# Patient Record
Sex: Male | Born: 1985 | Race: Black or African American | Hispanic: No | Marital: Single | Smoking: Current every day smoker
Health system: Southern US, Community
[De-identification: ages and names within clinical notes are randomized; demographics above are authoritative.]

## PROBLEM LIST (undated history)

## (undated) DIAGNOSIS — I10 Essential (primary) hypertension: Secondary | ICD-10-CM

## (undated) DIAGNOSIS — K449 Diaphragmatic hernia without obstruction or gangrene: Secondary | ICD-10-CM

## (undated) DIAGNOSIS — F4310 Post-traumatic stress disorder, unspecified: Secondary | ICD-10-CM

## (undated) DIAGNOSIS — K922 Gastrointestinal hemorrhage, unspecified: Secondary | ICD-10-CM

## (undated) DIAGNOSIS — G43909 Migraine, unspecified, not intractable, without status migrainosus: Secondary | ICD-10-CM

## (undated) DIAGNOSIS — F1920 Other psychoactive substance dependence, uncomplicated: Secondary | ICD-10-CM

## (undated) HISTORY — PX: KNEE SURGERY: SHX244

---

## 2007-02-03 ENCOUNTER — Emergency Department: Payer: Self-pay | Admitting: Emergency Medicine

## 2008-01-29 ENCOUNTER — Emergency Department: Payer: Self-pay | Admitting: Emergency Medicine

## 2008-01-29 ENCOUNTER — Other Ambulatory Visit: Payer: Self-pay

## 2015-06-12 ENCOUNTER — Encounter: Payer: Self-pay | Admitting: Emergency Medicine

## 2015-06-12 ENCOUNTER — Emergency Department
Admission: EM | Admit: 2015-06-12 | Discharge: 2015-06-15 | Disposition: A | Discharge status: Home / Self Care | Payer: Self-pay | Attending: Emergency Medicine | Admitting: Emergency Medicine

## 2015-06-12 DIAGNOSIS — Y9289 Other specified places as the place of occurrence of the external cause: Secondary | ICD-10-CM | POA: Insufficient documentation

## 2015-06-12 DIAGNOSIS — Y998 Other external cause status: Secondary | ICD-10-CM | POA: Insufficient documentation

## 2015-06-12 DIAGNOSIS — F14921 Cocaine use, unspecified with intoxication delirium: Secondary | ICD-10-CM

## 2015-06-12 DIAGNOSIS — R45851 Suicidal ideations: Secondary | ICD-10-CM

## 2015-06-12 DIAGNOSIS — F1210 Cannabis abuse, uncomplicated: Secondary | ICD-10-CM

## 2015-06-12 DIAGNOSIS — Y9389 Activity, other specified: Secondary | ICD-10-CM | POA: Insufficient documentation

## 2015-06-12 DIAGNOSIS — F4310 Post-traumatic stress disorder, unspecified: Secondary | ICD-10-CM

## 2015-06-12 DIAGNOSIS — I10 Essential (primary) hypertension: Secondary | ICD-10-CM | POA: Insufficient documentation

## 2015-06-12 DIAGNOSIS — F29 Unspecified psychosis not due to a substance or known physiological condition: Secondary | ICD-10-CM

## 2015-06-12 DIAGNOSIS — S069X9A Unspecified intracranial injury with loss of consciousness of unspecified duration, initial encounter: Secondary | ICD-10-CM

## 2015-06-12 DIAGNOSIS — F14221 Cocaine dependence with intoxication delirium: Secondary | ICD-10-CM | POA: Insufficient documentation

## 2015-06-12 DIAGNOSIS — N179 Acute kidney failure, unspecified: Secondary | ICD-10-CM | POA: Insufficient documentation

## 2015-06-12 DIAGNOSIS — S069XAA Unspecified intracranial injury with loss of consciousness status unknown, initial encounter: Secondary | ICD-10-CM

## 2015-06-12 DIAGNOSIS — F1420 Cocaine dependence, uncomplicated: Secondary | ICD-10-CM

## 2015-06-12 DIAGNOSIS — Z87891 Personal history of nicotine dependence: Secondary | ICD-10-CM | POA: Insufficient documentation

## 2015-06-12 HISTORY — DX: Gastrointestinal hemorrhage, unspecified: K92.2

## 2015-06-12 HISTORY — DX: Post-traumatic stress disorder, unspecified: F43.10

## 2015-06-12 HISTORY — DX: Other psychoactive substance dependence, uncomplicated: F19.20

## 2015-06-12 HISTORY — DX: Essential (primary) hypertension: I10

## 2015-06-12 LAB — URINALYSIS COMPLETE WITH MICROSCOPIC (ARMC ONLY)
BACTERIA UA: NONE SEEN
Bilirubin Urine: NEGATIVE
Glucose, UA: NEGATIVE mg/dL
Leukocytes, UA: NEGATIVE
Nitrite: NEGATIVE
Protein, ur: 100 mg/dL — AB
SPECIFIC GRAVITY, URINE: 1.018 (ref 1.005–1.030)
SQUAMOUS EPITHELIAL / LPF: NONE SEEN
pH: 5 (ref 5.0–8.0)

## 2015-06-12 LAB — URINE DRUG SCREEN, QUALITATIVE (ARMC ONLY)
Amphetamines, Ur Screen: NOT DETECTED
BARBITURATES, UR SCREEN: NOT DETECTED
BENZODIAZEPINE, UR SCRN: NOT DETECTED
CANNABINOID 50 NG, UR ~~LOC~~: POSITIVE — AB
Cocaine Metabolite,Ur ~~LOC~~: POSITIVE — AB
MDMA (Ecstasy)Ur Screen: NOT DETECTED
METHADONE SCREEN, URINE: NOT DETECTED
OPIATE, UR SCREEN: NOT DETECTED
Phencyclidine (PCP) Ur S: NOT DETECTED
TRICYCLIC, UR SCREEN: NOT DETECTED

## 2015-06-12 LAB — COMPREHENSIVE METABOLIC PANEL
ALT: 22 U/L (ref 17–63)
AST: 47 U/L — ABNORMAL HIGH (ref 15–41)
Albumin: 5.3 g/dL — ABNORMAL HIGH (ref 3.5–5.0)
Alkaline Phosphatase: 85 U/L (ref 38–126)
Anion gap: 15 (ref 5–15)
BUN: 17 mg/dL (ref 6–20)
CALCIUM: 10.3 mg/dL (ref 8.9–10.3)
CO2: 22 mmol/L (ref 22–32)
Chloride: 99 mmol/L — ABNORMAL LOW (ref 101–111)
Creatinine, Ser: 1.8 mg/dL — ABNORMAL HIGH (ref 0.61–1.24)
GFR calc Af Amer: 56 mL/min — ABNORMAL LOW (ref >60)
GFR calc non Af Amer: 49 mL/min — ABNORMAL LOW (ref >60)
Glucose, Bld: 116 mg/dL — ABNORMAL HIGH (ref 65–99)
Potassium: 3.7 mmol/L (ref 3.5–5.1)
Sodium: 136 mmol/L (ref 135–145)
Total Bilirubin: 1.5 mg/dL — ABNORMAL HIGH (ref 0.3–1.2)
Total Protein: 9.4 g/dL — ABNORMAL HIGH (ref 6.5–8.1)

## 2015-06-12 LAB — CBC
HCT: 45.6 % (ref 40.0–52.0)
HEMOGLOBIN: 15 g/dL (ref 13.0–18.0)
MCH: 30.5 pg (ref 26.0–34.0)
MCHC: 32.9 g/dL (ref 32.0–36.0)
MCV: 92.5 fL (ref 80.0–100.0)
PLATELETS: 142 10*3/uL — AB (ref 150–440)
RBC: 4.93 MIL/uL (ref 4.40–5.90)
RDW: 13.5 % (ref 11.5–14.5)
WBC: 18.6 10*3/uL — ABNORMAL HIGH (ref 3.8–10.6)

## 2015-06-12 LAB — SALICYLATE LEVEL: Salicylate Lvl: <4 mg/dL (ref 2.8–30.0)

## 2015-06-12 LAB — ETHANOL: Alcohol, Ethyl (B): <5 mg/dL (ref <5)

## 2015-06-12 LAB — ACETAMINOPHEN LEVEL

## 2015-06-12 LAB — CK: Total CK: 617 U/L — ABNORMAL HIGH (ref 49–397)

## 2015-06-12 MED ORDER — LORAZEPAM 2 MG/ML IJ SOLN
INTRAMUSCULAR | Status: AC
  Administered 2015-06-12: 1 mg via INTRAVENOUS
  Filled 2015-06-12: qty 1

## 2015-06-12 MED ORDER — LORAZEPAM 2 MG/ML IJ SOLN
1.0000 mg | Freq: Once | INTRAMUSCULAR | Status: AC
  Administered 2015-06-12: 1 mg via INTRAVENOUS

## 2015-06-12 MED ORDER — SODIUM CHLORIDE 0.9 % IV BOLUS (SEPSIS)
1000.0000 mL | INTRAVENOUS | Status: AC
  Administered 2015-06-12: 1000 mL via INTRAVENOUS

## 2015-06-12 NOTE — ED Notes (Signed)
Pt agreed to call mother Ezariah Einbinder at [phone] for questions or updates.

## 2015-06-12 NOTE — ED Notes (Signed)
Pt reports has been taking cocaine all day, $3000 worth pt last used around 1850. Pt also reports using alcohol. Pt states he tried to kill himself. Pt confused at this time. States hear voices and see pictures, telling him to kill himself. EMS reports HP-154/103, HR-117, O2-97% on room air.

## 2015-06-12 NOTE — ED Provider Notes (Signed)
[facility] Emergency Department Provider Note  ____________________________________________  Time seen: Approximately 9:47 PM  I have reviewed the triage vital signs and the nursing notes.   HISTORY  Chief Complaint Drug Overdose and Suicide Attempt  History is limited by intoxication  HPI Jeremy Frank is a 29 y.o. male with a history of "psych issues "and drug abuse who presents intoxicated after what he describes as a cocaine binge all day today.  He states he has also had alcohol this morning.  He states that he wants to kill himself and thought it would be fine to do.  He says that he hears voices and sees pictures and the voices are command hallucinations that tell him to kill himself.  He describes the symptoms as severe.  He denies chest pain, shortness of breath, abdominal pain, and vomiting.   Past Medical History  Diagnosis Date  . PTSD (post-traumatic stress disorder)   . GI bleed   . Hypertension   . Drug dependence     There are no active problems to display for this patient.   History reviewed. No pertinent past surgical history.  No current outpatient prescriptions on file.  Allergies Review of patient's allergies indicates no known allergies.  History reviewed. No pertinent family history.  Social History History  Substance Use Topics  . Smoking status: Former Smoker    Types: Cigarettes  . Smokeless tobacco: Never Used  . Alcohol Use: Yes    Review of Systems Constitutional: No fever/chills Eyes: No visual changes. ENT: No sore throat. Cardiovascular: Denies chest pain. Respiratory: Denies shortness of breath. Gastrointestinal: No abdominal pain.  No nausea, no vomiting.  No diarrhea.  No constipation. Genitourinary: Negative for dysuria. Musculoskeletal: Negative for back pain. Skin: Negative for rash. Neurological: Negative for headaches, focal weakness or numbness. Psych:  Drug abuse and active suicidal  ideation  10-point ROS otherwise negative.  ____________________________________________   PHYSICAL EXAM:  VITAL SIGNS: ED Triage Vitals  Enc Vitals Group     BP 06/12/15 1954 148/92 mmHg     Pulse Rate 06/12/15 1954 103     Resp 06/12/15 1954 14     Temp 06/12/15 1954 99.8 F (37.7 C)     Temp Source 06/12/15 1954 Oral     SpO2 06/12/15 1954 100 %     Weight --      Height --      Head Cir --      Peak Flow --      Pain Score 06/12/15 1955 0     Pain Loc --      Pain Edu? --      Excl. in GC? --     Constitutional: Alert to voice but otherwise somnolent.  No acute distress Eyes: Conjunctivae are normal. PERRL. EOMI. Head: Atraumatic. Nose: No congestion/rhinnorhea. Mouth/Throat: Mucous membranes are moist.  Oropharynx non-erythematous. Neck: No stridor.   Cardiovascular: Normal rate, regular rhythm. Grossly normal heart sounds.  Good peripheral circulation. Respiratory: Normal respiratory effort.  No retractions. Lungs CTAB. Gastrointestinal: Soft and nontender. No distention. No abdominal bruits. No CVA tenderness. Musculoskeletal: No lower extremity tenderness nor edema.  No joint effusions. Neurologic:  Normal speech and language. No gross focal neurologic deficits are appreciated. Speech is normal. Skin:  Skin is warm, dry and intact. No rash noted.   ____________________________________________   LABS (all labs ordered are listed, but only abnormal results are displayed)  Labs Reviewed  ACETAMINOPHEN LEVEL - Abnormal; Notable for the  following:    Acetaminophen (Tylenol), Serum <10 (*)    All other components within normal limits  CBC - Abnormal; Notable for the following:    WBC 18.6 (*)    Platelets 142 (*)    All other components within normal limits  COMPREHENSIVE METABOLIC PANEL - Abnormal; Notable for the following:    Chloride 99 (*)    Glucose, Bld 116 (*)    Creatinine, Ser 1.80 (*)    Total Protein 9.4 (*)    Albumin 5.3 (*)    AST 47 (*)     Total Bilirubin 1.5 (*)    GFR calc non Af Amer 49 (*)    GFR calc Af Amer 56 (*)    All other components within normal limits  URINE DRUG SCREEN, QUALITATIVE ([facility] ONLY) - Abnormal; Notable for the following:    Cocaine Metabolite,Ur [place] POSITIVE (*)    Cannabinoid 50 Ng, Ur [place] POSITIVE (*)    All other components within normal limits  URINALYSIS COMPLETEWITH MICROSCOPIC ([facility] ONLY) - Abnormal; Notable for the following:    Color, Urine YELLOW (*)    APPearance HAZY (*)    Ketones, ur TRACE (*)    Hgb urine dipstick 1+ (*)    Protein, ur 100 (*)    All other components within normal limits  CK - Abnormal; Notable for the following:    Total CK 617 (*)    All other components within normal limits  ETHANOL  SALICYLATE LEVEL  BASIC METABOLIC PANEL  CK   ____________________________________________  EKG  ED ECG REPORT I, Janette Harvie, the attending physician, personally viewed and interpreted this ECG.  Date: 06/12/2015 EKG Time: 19:47 Rate: 102 Rhythm: sinus tachycardia QRS Axis: normal Intervals: normal ST/T Wave abnormalities: normal Conduction Disutrbances: none Narrative Interpretation: unremarkable  ____________________________________________  RADIOLOGY  No results found.  ____________________________________________   PROCEDURES  Procedure(s) performed: None  Critical Care performed: No ____________________________________________   INITIAL IMPRESSION / ASSESSMENT AND PLAN / ED COURSE  Pertinent labs & imaging results that were available during my care of the patient were reviewed by me and considered in my medical decision making (see chart for details).  The patient does appear intoxicated.  His labs are notable for a leukocytosis which could be expected after a cocaine binge like he describes.  He has cannabinoids and cocaine on his UDS and his metabolic panel is notable for a creatinine of 1.8 and a CK of 617.  I am treating him aggressively  with a total of 3 L of IV fluids and will plan to recheck a metabolic panel and CK at 5 AM to determine if he needs admission for further medical management or if he is adequately hydrated.  He is complaining of no specific pain at this time.  I have put him under IVC and requested a psychiatric consult.  ____________________________________________  FINAL CLINICAL IMPRESSION(S) / ED DIAGNOSES  Final diagnoses:  Cocaine intoxication, with delirium  Suicidal ideation  Acute kidney injury      NEW MEDICATIONS STARTED DURING THIS VISIT:  New Prescriptions   No medications on file     Loleta Rose, MD 06/13/15 [phone]

## 2015-06-12 NOTE — ED Notes (Addendum)
Pt is not currently medically cleared, but will be pending psych evaluation  BEHAVIORAL HEALTH ROUNDING Patient sleeping: No. Patient alert and oriented: no Behavior appropriate: No.; If no, describe: pt is hallucinating  Toileting and hygiene offered: Yes  Sitter present: no  Law senforcement present: No  ENVIRONMENTAL ASSESSMENT Potentially harmful objects out of patient reach: No. Personal belongings secured: Yes.   Patient dressed in hospital provided attire only: Yes.   Plastic bags out of patient reach: Yes.   Patient care equipment (cords, cables, call bells, lines, and drains) shortened, removed, or accounted for: Yes.   Equipment and supplies removed from bottom of stretcher: Yes.   Potentially toxic materials out of patient reach: Yes.   Sharps container removed or out of patient reach: No.

## 2015-06-13 DIAGNOSIS — S069X9A Unspecified intracranial injury with loss of consciousness of unspecified duration, initial encounter: Secondary | ICD-10-CM

## 2015-06-13 DIAGNOSIS — F1420 Cocaine dependence, uncomplicated: Secondary | ICD-10-CM

## 2015-06-13 DIAGNOSIS — F29 Unspecified psychosis not due to a substance or known physiological condition: Secondary | ICD-10-CM

## 2015-06-13 DIAGNOSIS — S069XAA Unspecified intracranial injury with loss of consciousness status unknown, initial encounter: Secondary | ICD-10-CM

## 2015-06-13 DIAGNOSIS — F1210 Cannabis abuse, uncomplicated: Secondary | ICD-10-CM

## 2015-06-13 DIAGNOSIS — F4310 Post-traumatic stress disorder, unspecified: Secondary | ICD-10-CM

## 2015-06-13 LAB — BASIC METABOLIC PANEL
ANION GAP: 9 (ref 5–15)
BUN: 15 mg/dL (ref 6–20)
CALCIUM: 9.2 mg/dL (ref 8.9–10.3)
CO2: 22 mmol/L (ref 22–32)
Chloride: 106 mmol/L (ref 101–111)
Creatinine, Ser: 1.36 mg/dL — ABNORMAL HIGH (ref 0.61–1.24)
GFR calc Af Amer: >60 mL/min (ref >60)
GFR calc non Af Amer: >60 mL/min (ref >60)
GLUCOSE: 85 mg/dL (ref 65–99)
Potassium: 4.5 mmol/L (ref 3.5–5.1)
SODIUM: 137 mmol/L (ref 135–145)

## 2015-06-13 LAB — CK: Total CK: 400 U/L — ABNORMAL HIGH (ref 49–397)

## 2015-06-13 MED ORDER — SODIUM CHLORIDE 0.9 % IV BOLUS (SEPSIS)
1000.0000 mL | INTRAVENOUS | Status: AC
  Administered 2015-06-13: 1000 mL via INTRAVENOUS
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
## 2015-06-13 NOTE — ED Notes (Signed)
BEHAVIORAL HEALTH ROUNDING Patient sleeping: No. Patient alert and oriented: yes Behavior appropriate: Yes.  ; If no, describe:  Nutrition and fluids offered: Yes  Toileting and hygiene offered: Yes  Sitter present: yes Law enforcement present: Yes  

## 2015-06-13 NOTE — ED Notes (Signed)
BEHAVIORAL HEALTH ROUNDING Patient sleeping: Yes.   Patient alert and oriented: not applicable Behavior appropriate: Yes.   Nutrition and fluids offered: Food tray at bedside  Toileting and hygiene offered: pt sleeping.  Sitter present: 15 min checks  Law enforcement present: Yes

## 2015-06-13 NOTE — ED Notes (Signed)
BEHAVIORAL HEALTH ROUNDING Patient sleeping: Yes.   Patient alert and oriented: yes Behavior appropriate: Yes.  ; If no, describe:  Nutrition and fluids offered: No Toileting and hygiene offered: No Sitter present: yes Law enforcement present: Yes [employer] 

## 2015-06-13 NOTE — ED Notes (Addendum)
Pt is not currently medically cleared, but will be pending psych evaluation   BEHAVIORAL HEALTH ROUNDING  Patient sleeping: Yes  Patient alert and oriented: no  Behavior appropriate: No.; If no, describe: pt is hallucinating  Toileting and hygiene offered: Yes  Sitter present: yes  Law senforcement present: Yes

## 2015-06-13 NOTE — BH Assessment (Signed)
Assessment Note  Jeremy Frank is an 29 y.o. male. He reports to the ED with auditory and visual hallucinations.  He states "I have been smoking crack for like five days".  He reports that in addition to smoking Crack, he has been indulging in alcohol.  He states that he is not depressed or anxious.  He admitted to having auditory hallucinations the command him to kill himself.  He denies wanting to harm himself.  He denied having suicidal or homicidal ideation or intent.  [name] reports that he is now homeless and is currently on parole.  He reports that his eating, sleeping , and energy levels have decreased.  He appeared unaware of what was occurring around him.  [name] did not appear to completely forthwright with the triage specialist.  [name] appeared to be responding to internal stimuli during the interview with the triage specialist.    Axis I: Post Traumatic Stress Disorder Axis II: Deferred Axis III:  Past Medical History  Diagnosis Date   PTSD (post-traumatic stress disorder)    GI bleed    Hypertension    Drug dependence    Axis IV: housing problems Axis V: 31-40 impairment in reality testing  Past Medical History:  Past Medical History  Diagnosis Date   PTSD (post-traumatic stress disorder)    GI bleed    Hypertension    Drug dependence     History reviewed. No pertinent past surgical history.  Family History: History reviewed. No pertinent family history.  Social History:  reports that he has quit smoking. His smoking use included Cigarettes. He has never used smokeless tobacco. He reports that he drinks alcohol. He reports that he uses illicit drugs (Cocaine).  Additional Social History:  Alcohol / Drug Use History of alcohol / drug use?: Yes Longest period of sobriety (when/how long): Unknown Withdrawal Symptoms:  (Unknown) Substance #1 Name of Substance 1: Crack 1 - Age of First Use: Unknown 1 - Amount (size/oz): "A lot" 1 -  Frequency: Daily 1 - Last Use / Amount: 06/12/2015 Substance #2 Name of Substance 2: Alcohol 2 - Age of First Use: 6 2 - Amount (size/oz): "A lot" 2 - Frequency: Daily 2 - Last Use / Amount: 06/12/2015  CIWA: CIWA-Ar BP: 118/81 mmHg Pulse Rate: 77 COWS:    Allergies: No Known Allergies  Home Medications:  (Not in a hospital admission)  OB/GYN Status:  No LMP for male patient.  General Assessment Data Location of Assessment: [facility] ED TTS Assessment: In system Is this a Tele or Face-to-Face Assessment?: Face-to-Face Is this an Initial Assessment or a Re-assessment for this encounter?: Initial Assessment Marital status: Single Maiden name: n/a Is patient pregnant?: No Pregnancy Status: No Living Arrangements:  (Homeless) Can pt return to current living arrangement?: Yes Admission Status: Voluntary Is patient capable of signing voluntary admission?: No Referral Source: MD Insurance type: None reported  Medical Screening Exam [facility] Walk-in ONLY) Medical Exam completed: Yes  Crisis Care Plan Living Arrangements:  (Homeless) Name of Psychiatrist: None Name of Therapist: None  Education Status Is patient currently in school?: No Current Grade: n/a Highest grade of school patient has completed: 12th Name of school: n/a Contact person: n/a  Risk to self with the past 6 months Suicidal Ideation: No Has patient been a risk to self within the past 6 months prior to admission? : No Suicidal Intent: No Has patient had any suicidal intent within the past 6 months prior to admission? : No  Is patient at risk for suicide?: No Suicidal Plan?: No Has patient had any suicidal plan within the past 6 months prior to admission? : No Access to Means: No What has been your use of drugs/alcohol within the last 12 months?: Daily Usage Previous Attempts/Gestures: No How many times?: 0 Other Self Harm Risks: Substance abuse Triggers for Past Attempts: None known Intentional Self  Injurious Behavior: None Family Suicide History: Unknown Recent stressful life event(s):  (Parole) Persecutory voices/beliefs?: No Depression: No Depression Symptoms:  (None reported) Substance abuse history and/or treatment for substance abuse?: Yes Suicide prevention information given to non-admitted patients: Not applicable  Risk to Others within the past 6 months Homicidal Ideation: No Does patient have any lifetime risk of violence toward others beyond the six months prior to admission? : No Thoughts of Harm to Others: No Current Homicidal Intent: No Current Homicidal Plan: No Access to Homicidal Means: No Identified Victim: None identified History of harm to others?:  (Unknown) Assessment of Violence: None Noted Violent Behavior Description: n/a Does patient have access to weapons?: No Criminal Charges Pending?: No Does patient have a court date: No Is patient on probation?: Yes (Parole)  Psychosis Hallucinations: Visual, Auditory Delusions: None noted  Mental Status Report Appearance/Hygiene: In scrubs Eye Contact: Poor Motor Activity: Restlessness Speech: Tangential Level of Consciousness: Restless Mood: Silly Affect: Silly Anxiety Level: None Thought Processes: Tangential, Flight of Ideas Judgement: Impaired Orientation: Not oriented Obsessive Compulsive Thoughts/Behaviors: None                            Advance Directives (For Healthcare) Does patient have an advance directive?: No Would patient like information on creating an advanced directive?: No - patient declined information          Disposition:  Disposition Initial Assessment Completed for this Encounter: Yes Disposition of Patient: Other dispositions (Psych MD)  On Site Evaluation by:   Reviewed with Physician:    Theadora Rama 06/13/2015 12:42 AM

## 2015-06-13 NOTE — ED Notes (Signed)
Family in to visit.

## 2015-06-13 NOTE — ED Notes (Signed)
BEHAVIORAL HEALTH ROUNDING Patient sleeping: No. Patient alert and oriented: yes Behavior appropriate: Yes.  ; If no, describe:  Nutrition and fluids offered: yes Toileting and hygiene offered: Yes  Sitter present: q15 minute observations and security camera monitoring Law enforcement present: Yes  ODS  

## 2015-06-13 NOTE — BHH Counselor (Addendum)
Spoke with [facility] ([phone] ext [phone]), they are NOT on diversion. Was advised to submit transfer packet to them for possible bed.

## 2015-06-13 NOTE — ED Notes (Signed)
BEHAVIORAL HEALTH ROUNDING Patient sleeping: yes Patient alert and oriented: sleeping Behavior appropriate:yes  Toileting and hygiene offered: sleeping Sitter present: yes  Law senforcement present: yes, ods

## 2015-06-13 NOTE — ED Provider Notes (Signed)
-----------------------------------------   7:00 AM on 06/13/2015 -----------------------------------------   BP 118/80 mmHg  Pulse 57  Temp(Src) 99.1 F (37.3 C) (Oral)  Resp 17  SpO2 100%  The patient had no acute events since last update.  Calm and cooperative at this time.  Disposition is pending evaluation from Psychiatry/Behavioral Medicine team recommendations.     Rebecka Apley, MD 06/13/15 [phone]

## 2015-06-13 NOTE — ED Notes (Signed)

## 2015-06-13 NOTE — ED Notes (Signed)
Pt transferred into ED BHU room 3 after receiving report from [place].   Patient assigned to appropriate care area. Patient oriented to unit/care area: Informed that, for their safety, care areas are designed for safety and monitored by security cameras at all times; Visiting hours and phone times explained to patient. Patient verbalizes understanding, and verbal contract for safety obtained.

## 2015-06-13 NOTE — ED Notes (Signed)
Patient assigned to appropriate care area. Patient oriented to unit/care area: Informed that, for their safety, care areas are designed for safety and monitored by security cameras at all times; and visiting hours explained to patient. Patient verbalizes understanding, and verbal contract for safety obtained. 

## 2015-06-13 NOTE — ED Notes (Signed)
Meal given to Pt.

## 2015-06-13 NOTE — ED Notes (Signed)
BEHAVIORAL HEALTH ROUNDING Patient sleeping: Yes.   Patient alert and oriented: yes Behavior appropriate: Yes.  ; If no, describe:  Nutrition and fluids offered: No Toileting and hygiene offered: No Sitter present: yes Law enforcement present: Yes ODS security

## 2015-06-13 NOTE — ED Notes (Signed)
Pt ambulatory to BHU,  Report given to Amy, RN to assume care.    She is aware of patient c/o headache.

## 2015-06-13 NOTE — ED Notes (Signed)
ED BHU PLACEMENT JUSTIFICATION Is the patient under IVC or is there intent for IVC: Yes.   Is the patient medically cleared: Yes.   Is there vacancy in the ED BHU: Yes.   Is the population mix appropriate for patient: Yes.   Is the patient awaiting placement in inpatient or outpatient setting: Yes.   Has the patient had a psychiatric consult: Yes.   Survey of unit performed for contraband, proper placement and condition of furniture, tampering with fixtures in bathroom, shower, and each patient room: Yes.  ; Findings:  APPEARANCE/BEHAVIOR calm, cooperative and adequate rapport can be established NEURO ASSESSMENT Orientation: time, place and person Hallucinations: No.None noted (Hallucinations) Speech: Normal Gait: normal RESPIRATORY ASSESSMENT Normal expansion.  Clear to auscultation.  No rales, rhonchi, or wheezing. CARDIOVASCULAR ASSESSMENT regular rate and rhythm, S1, S2 normal, no murmur, click, rub or gallop GASTROINTESTINAL ASSESSMENT soft, nontender, BS WNL, no r/g EXTREMITIES normal strength, tone, and muscle mass, no deformities, no erythema, induration, or nodules, ROM of all joints is normal, no evidence of joint instability PLAN OF CARE Provide calm/safe environment. Vital signs assessed twice daily. ED BHU Assessment once each 12-hour shift. Collaborate with intake RN daily or as condition indicates. Assure the ED provider has rounded once each shift. Provide and encourage hygiene. Provide redirection as needed. Assess for escalating behavior; address immediately and inform ED provider.  Assess family dynamic and appropriateness for visitation as needed: No.; If necessary, describe findings: Un able to assess family dynamics.  Educate the patient/family about BHU procedures/visitation: Yes.  ; If necessary, describe findings:   

## 2015-06-13 NOTE — ED Notes (Signed)
BEHAVIORAL HEALTH ROUNDING Patient sleeping: Yes.   Patient alert and oriented: not applicable Behavior appropriate: Yes.   Nutrition and fluids offered: Yes  Toileting and hygiene offered: Yes  Sitter present: 15 min checks  Law enforcement present: Yes  

## 2015-06-13 NOTE — ED Notes (Signed)
IVC / Consult completed/ Pending Placement 

## 2015-06-14 MED ORDER — SERTRALINE HCL 50 MG PO TABS
ORAL_TABLET | ORAL | Status: AC
  Administered 2015-06-14: 50 mg via ORAL
  Filled 2015-06-14: qty 1

## 2015-06-14 MED ORDER — SERTRALINE HCL 50 MG PO TABS
50.0000 mg | ORAL_TABLET | Freq: Every day | ORAL | Status: DC
  Administered 2015-06-14: 50 mg via ORAL
  Filled 2015-06-14: qty 1

## 2015-06-14 NOTE — Consult Note (Signed)
[facility] Psychiatry Consult   Reason for Consult:  Follow-up for this 29 year old man with posttraumatic stress disorder and a history of brain injury was cocaine abusequale Referring Physician:  quale Patient Identification: QUANTARIUS GENRICH MRN:  [id number] Principal Diagnosis: Psychosis Diagnosis:   Patient Active Problem List   Diagnosis Date Noted  . Psychosis [F29] 06/13/2015  . Posttraumatic stress disorder [F43.10] 06/13/2015  . 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 Cocaine dependence [F14.20] 06/13/2015  . Marijuana abuse [F12.10] 06/13/2015    Total Time spent with patient: 45 minutes  Subjective:   ZAMIR STAPLES is a 29 y.o. male patient admitted with patient today states that he is feeling better but says that he still feels like there is no hope for him.Marland Kitchen  HPI:  See full history yesterday. Since yesterday patient's mental status has become more interactive. He still only makes intermittent eye contact. His speech is still soft and at times he seems to speak to himself but he is more or less able to carry on a conversation. He still talks about being hopeless and about people pulling his triggers and making him go off at times. He is unable to articulate a clear plan for the future. He admits that he's been abusing cocaine. He is able to tell me about past medications he has taken that might of been helpful including Zoloft and Prozac. He has not been agitated or violent here in the emergency room. He made a vague statement about how he might go off if he has to be closed in any more HPI Elements:   Quality:  Mood lability confusion and anxiety. Severity:  Moderate. Timing:  Getting better over the last day. Duration:  Chronic intermittent problem since he was in the [employer]. Context:  Cocaine abuse and homelessness.  Past Medical History:  Past Medical History  Diagnosis Date  . PTSD (post-traumatic stress disorder)   . GI bleed   . Hypertension    . Drug dependence    History reviewed. No pertinent past surgical history. Family History: History reviewed. No pertinent family history. Social History:  History  Alcohol Use  . Yes     History  Drug Use  . Yes  . Special: Cocaine    History   Social History  . Marital Status: Single    Spouse Name: N/A  . Number of Children: N/A  . Years of Education: N/A   Social History Main Topics  . Smoking status: Former Smoker    Types: Cigarettes  . Smokeless tobacco: Never Used  . Alcohol Use: Yes  . Drug Use: Yes    Special: Cocaine  . Sexual Activity: Yes   Other Topics Concern  . None   Social History Narrative  . None   Additional Social History:    History of alcohol / drug use?: Yes Longest period of sobriety (when/how long): Unknown Withdrawal Symptoms:  (Unknown) Name of Substance 1: Crack 1 - Age of First Use: Unknown 1 - Amount (size/oz): "A lot" 1 - Frequency: Daily 1 - Last Use / Amount: 06/12/2015 Name of Substance 2: Alcohol 2 - Age of First Use: 6 2 - Amount (size/oz): "A lot" 2 - Frequency: Daily 2 - Last Use / Amount: 06/12/2015                 Allergies:  No Known Allergies  Labs:  Results for orders placed or performed during the hospital encounter of 06/12/15 (from the past 48 hour(s))  Acetaminophen level     Status: Abnormal   Collection Time: 06/12/15  8:05 PM  Result Value Ref Range   Acetaminophen (Tylenol), Serum <10 (L) 10 - 30 ug/mL    Comment:        THERAPEUTIC CONCENTRATIONS VARY SIGNIFICANTLY. A RANGE OF 10-30 ug/mL MAY BE AN EFFECTIVE CONCENTRATION FOR MANY PATIENTS. HOWEVER, SOME ARE BEST TREATED AT CONCENTRATIONS OUTSIDE THIS RANGE. ACETAMINOPHEN CONCENTRATIONS >150 ug/mL AT 4 HOURS AFTER INGESTION AND >50 ug/mL AT 12 HOURS AFTER INGESTION ARE OFTEN ASSOCIATED WITH TOXIC REACTIONS.   CBC     Status: Abnormal   Collection Time: 06/12/15  8:05 PM  Result Value Ref Range   WBC 18.6 (H) 3.8 - 10.6 K/uL   RBC  4.93 4.40 - 5.90 MIL/uL   Hemoglobin 15.0 13.0 - 18.0 g/dL   HCT 45.6 40.0 - 52.0 %   MCV 92.5 80.0 - 100.0 fL   MCH 30.5 26.0 - 34.0 pg   MCHC 32.9 32.0 - 36.0 g/dL   RDW 13.5 11.5 - 14.5 %   Platelets 142 (L) 150 - 440 K/uL    Comment: COUNT MAY BE INACCURATE DUE TO FIBRIN CLUMPS.  Comprehensive metabolic panel     Status: Abnormal   Collection Time: 06/12/15  8:05 PM  Result Value Ref Range   Sodium 136 135 - 145 mmol/L   Potassium 3.7 3.5 - 5.1 mmol/L   Chloride 99 (L) 101 - 111 mmol/L   CO2 22 22 - 32 mmol/L   Glucose, Bld 116 (H) 65 - 99 mg/dL   BUN 17 6 - 20 mg/dL   Creatinine, Ser 1.80 (H) 0.61 - 1.24 mg/dL   Calcium 10.3 8.9 - 10.3 mg/dL   Total Protein 9.4 (H) 6.5 - 8.1 g/dL   Albumin 5.3 (H) 3.5 - 5.0 g/dL   AST 47 (H) 15 - 41 U/L   ALT 22 17 - 63 U/L   Alkaline Phosphatase 85 38 - 126 U/L   Total Bilirubin 1.5 (H) 0.3 - 1.2 mg/dL   GFR calc non Af Amer 49 (L) >60 mL/min   GFR calc Af Amer 56 (L) >60 mL/min    Comment: (NOTE) The eGFR has been calculated using the CKD EPI equation. This calculation has not been validated in all clinical situations. eGFR's persistently <60 mL/min signify possible Chronic Kidney Disease.    Anion gap 15 5 - 15  Ethanol (ETOH)     Status: None   Collection Time: 06/12/15  8:05 PM  Result Value Ref Range   Alcohol, Ethyl (B) <5 <5 mg/dL    Comment:        LOWEST DETECTABLE LIMIT FOR SERUM ALCOHOL IS 5 mg/dL FOR MEDICAL PURPOSES ONLY   Salicylate level     Status: None   Collection Time: 06/12/15  8:05 PM  Result Value Ref Range   Salicylate Lvl <6.3 2.8 - 30.0 mg/dL  CK     Status: Abnormal   Collection Time: 06/12/15  8:05 PM  Result Value Ref Range   Total CK 617 (H) 49 - 397 U/L  Urine Drug Screen, Qualitative ([facility] only)     Status: Abnormal   Collection Time: 06/12/15  9:09 PM  Result Value Ref Range   Tricyclic, Ur Screen NONE DETECTED NONE DETECTED   Amphetamines, Ur Screen NONE DETECTED NONE DETECTED   MDMA  (Ecstasy)Ur Screen NONE DETECTED NONE DETECTED   Cocaine Metabolite,Ur [place] POSITIVE (A) NONE DETECTED   Opiate, Ur Screen NONE DETECTED  NONE DETECTED   Phencyclidine (PCP) Ur S NONE DETECTED NONE DETECTED   Cannabinoid 50 Ng, Ur [place] POSITIVE (A) NONE DETECTED   Barbiturates, Ur Screen NONE DETECTED NONE DETECTED   Benzodiazepine, Ur Scrn NONE DETECTED NONE DETECTED   Methadone Scn, Ur NONE DETECTED NONE DETECTED    Comment: (NOTE) 158  Tricyclics, urine               Cutoff 1000 ng/mL 200  Amphetamines, urine             Cutoff 1000 ng/mL 300  MDMA (Ecstasy), urine           Cutoff 500 ng/mL 400  Cocaine Metabolite, urine       Cutoff 300 ng/mL 500  Opiate, urine                   Cutoff 300 ng/mL 600  Phencyclidine (PCP), urine      Cutoff 25 ng/mL 700  Cannabinoid, urine              Cutoff 50 ng/mL 800  Barbiturates, urine             Cutoff 200 ng/mL 900  Benzodiazepine, urine           Cutoff 200 ng/mL 1000 Methadone, urine                Cutoff 300 ng/mL 1100 1200 The urine drug screen provides only a preliminary, unconfirmed 1300 analytical test result and should not be used for non-medical 1400 purposes. Clinical consideration and professional judgment should 1500 be applied to any positive drug screen result due to possible 1600 interfering substances. A more specific alternate chemical method 1700 must be used in order to obtain a confirmed analytical result.  1800 Gas chromato graphy / mass spectrometry (GC/MS) is the preferred 1900 confirmatory method.   Urinalysis complete, with microscopic ([facility] only)     Status: Abnormal   Collection Time: 06/12/15  9:09 PM  Result Value Ref Range   Color, Urine YELLOW (A) YELLOW   APPearance HAZY (A) CLEAR   Glucose, UA NEGATIVE NEGATIVE mg/dL   Bilirubin Urine NEGATIVE NEGATIVE   Ketones, ur TRACE (A) NEGATIVE mg/dL   Specific Gravity, Urine 1.018 1.005 - 1.030   Hgb urine dipstick 1+ (A) NEGATIVE   pH 5.0 5.0 - 8.0   Protein,  ur 100 (A) NEGATIVE mg/dL   Nitrite NEGATIVE NEGATIVE   Leukocytes, UA NEGATIVE NEGATIVE   RBC / HPF 0-5 0 - 5 RBC/hpf   WBC, UA 0-5 0 - 5 WBC/hpf   Bacteria, UA NONE SEEN NONE SEEN   Squamous Epithelial / LPF NONE SEEN NONE SEEN   Mucous PRESENT    Hyaline Casts, UA PRESENT   Basic metabolic panel     Status: Abnormal   Collection Time: 06/13/15  4:57 AM  Result Value Ref Range   Sodium 137 135 - 145 mmol/L   Potassium 4.5 3.5 - 5.1 mmol/L   Chloride 106 101 - 111 mmol/L   CO2 22 22 - 32 mmol/L   Glucose, Bld 85 65 - 99 mg/dL   BUN 15 6 - 20 mg/dL   Creatinine, Ser 1.36 (H) 0.61 - 1.24 mg/dL   Calcium 9.2 8.9 - 10.3 mg/dL   GFR calc non Af Amer >60 >60 mL/min   GFR calc Af Amer >60 >60 mL/min    Comment: (NOTE) The eGFR has been calculated using the CKD EPI equation. This calculation  has not been validated in all clinical situations. eGFR's persistently <60 mL/min signify possible Chronic Kidney Disease.    Anion gap 9 5 - 15  CK     Status: Abnormal   Collection Time: 06/13/15  4:57 AM  Result Value Ref Range   Total CK 400 (H) 49 - 397 U/L    Vitals: Blood pressure 126/70, pulse 71, temperature 98.4 F (36.9 C), temperature source Oral, resp. rate 18, SpO2 100 %.  Risk to Self: Suicidal Ideation: No Suicidal Intent: No Is patient at risk for suicide?: No Suicidal Plan?: No Access to Means: No What has been your use of drugs/alcohol within the last 12 months?: Daily Usage How many times?: 0 Other Self Harm Risks: Substance abuse Triggers for Past Attempts: None known Intentional Self Injurious Behavior: None Risk to Others: Homicidal Ideation: No Thoughts of Harm to Others: No Current Homicidal Intent: No Current Homicidal Plan: No Access to Homicidal Means: No Identified Victim: None identified History of harm to others?:  (Unknown) Assessment of Violence: None Noted Violent Behavior Description: n/a Does patient have access to weapons?: No Criminal  Charges Pending?: No Does patient have a court date: No Prior Inpatient Therapy:   Prior Outpatient Therapy:    No current facility-administered medications for this encounter.   No current outpatient prescriptions on file.    Musculoskeletal: Strength & Muscle Tone: within normal limits Gait & Station: normal Patient leans: N/A  Psychiatric Specialty Exam: Physical Exam  Constitutional: He appears well-developed and well-nourished.  HENT:  Head: Normocephalic and atraumatic.  Eyes: Conjunctivae are normal. Pupils are equal, round, and reactive to light.  Neck: Normal range of motion.  Cardiovascular: Normal heart sounds.   Respiratory: Effort normal.  GI: Soft.  Musculoskeletal: Normal range of motion.  Neurological: He is alert.  Skin: Skin is warm and dry.  Psychiatric: His mood appears anxious. His speech is delayed. Thought content is paranoid. Cognition and memory are normal. He expresses impulsivity.  Patient continues to appear pod although he is more cooperative with the interview today. He still makes only intermittent eye contact. Frequently repeats things and seems to talk to himself during the interview. Seems a little uncomfortable and agitated at times. He is inattentive.    Review of Systems  Constitutional: Negative.   HENT: Negative.   Eyes: Negative.   Respiratory: Negative.   Cardiovascular: Negative.   Gastrointestinal: Negative.   Musculoskeletal: Negative.   Skin: Negative.   Neurological: Negative.   Psychiatric/Behavioral: Positive for depression and substance abuse. Negative for suicidal ideas and hallucinations. The patient is nervous/anxious and has insomnia.     Blood pressure 126/70, pulse 71, temperature 98.4 F (36.9 C), temperature source Oral, resp. rate 18, SpO2 100 %.There is no height or weight on file to calculate BMI.  General Appearance: Casual  Eye Contact::  Minimal  Speech:  Slow  Volume:  Decreased  Mood:  Anxious  Affect:   Blunt  Thought Process:  Circumstantial  Orientation:  Full (Time, Place, and Person)  Thought Content:  Negative  Suicidal Thoughts:  No  Homicidal Thoughts:  No  Memory:  Immediate;   Fair Recent;   Poor Remote;   Fair  Judgement:  Impaired  Insight:  Shallow  Psychomotor Activity:  Decreased  Concentration:  Fair  Recall:  [place] of Knowledge:Good  Language: Good  Akathisia:  No  Handed:  Right  AIMS (if indicated):     Assets:  Agricultural consultant Physical  Health  ADL's:  Intact  Cognition: Impaired,  Mild  Sleep:      Medical Decision Making: Review of Psycho-Social Stressors (1), Established Problem, Worsening (2) and Review of Medication Regimen & Side Effects (2)  Treatment Plan Summary: Plan Patient has been referred to the [place]. Yesterday they said that they did have beds available and we filed a request packet. Unknown today what update there is been. Based on his history and think I will go ahead and start him on a modest dose of sertraline. Supportive and educational counseling completed. Given his confusion and lack of planning I don't feel very comfortable discharging him. I suspect the risks his extremely high that he will be using cocaine and be in trouble again quickly. I think it would be very much in his best interest to get him to the [facility]. He is a little irritated about it but not trying to fight about it.  Plan:  Recommend psychiatric Inpatient admission when medically cleared. Disposition: Refer is pending to the [facility] 06/14/2015 3:41 PM

## 2015-06-14 NOTE — ED Notes (Signed)
BEHAVIORAL HEALTH ROUNDING Patient sleeping: Yes.   Patient alert and oriented: Pt is sleeping.  Behavior appropriate: Pt is sleeping Nutrition and fluids offered: Pt is sleeping.  Toileting and hygiene offered: Pt is sleeping.  Sitter present: yes Law enforcement present: Yes  

## 2015-06-14 NOTE — ED Notes (Signed)
Pt. Noted in room. No complaints or concerns voiced. No distress or abnormal behavior noted. Will continue to monitor with security cameras. Q 15 minute rounds continue. Sandwich and soft drink given. 

## 2015-06-14 NOTE — ED Notes (Signed)
Pt. Noted sleeping in room. No complaints or concerns voiced. No distress or abnormal behavior noted. Will continue to monitor with security cameras. Q 15 minute rounds continue. 

## 2015-06-14 NOTE — BHH Counselor (Addendum)
Referral information resent/refax to [facility] and confirmed it was received (Tonya-[phone]).   Obtained correct Security Number # [id number] and forwarded Pt. Access Ellyn Hack).

## 2015-06-14 NOTE — ED Notes (Signed)

## 2015-06-14 NOTE — ED Notes (Addendum)

## 2015-06-14 NOTE — ED Notes (Signed)

## 2015-06-14 NOTE — ED Notes (Signed)
ED BHU PLACEMENT JUSTIFICATION Is the patient under IVC or is there intent for IVC: No. Is the patient medically cleared: Yes.   Is there vacancy in the ED BHU: Yes.   Is the population mix appropriate for patient: Yes.   Is the patient awaiting placement in inpatient or outpatient setting: Yes.   Has the patient had a psychiatric consult: Yes.   Survey of unit performed for contraband, proper placement and condition of furniture, tampering with fixtures in bathroom, shower, and each patient room: Yes.  ; Findings: none APPEARANCE/BEHAVIOR calm and cooperative NEURO ASSESSMENT Orientation: time, place and person Hallucinations: No.None noted (Hallucinations) Speech: Normal Gait: normal RESPIRATORY ASSESSMENT Breathing Pattern: Even and unlabored CARDIOVASCULAR ASSESSMENT Skin warm and dry; Color WNL GASTROINTESTINAL ASSESSMENT No abdominal complaints noted at this time. EXTREMITIES Moves all extremities PLAN OF CARE Provide calm/safe environment. Vital signs assessed twice daily. ED BHU Assessment once each 12-hour shift. Collaborate with intake RN daily or as condition indicates. Assure the ED provider has rounded once each shift. Provide and encourage hygiene. Provide redirection as needed. Assess for escalating behavior; address immediately and inform ED provider.  Assess family dynamic and appropriateness for visitation as needed: Yes.  ; If necessary, describe findings: n/a Educate the patient/family about BHU procedures/visitation: Yes.  ; If necessary, describe findings: n/a

## 2015-06-14 NOTE — ED Notes (Signed)
Report received from Jean RN. Pt. Alert and oriented in no distress denies SI, HI, AVH and pain.  Pt. Instructed to come to me with problems or concerns.Will continue to monitor for safety via security cameras and Q 15 minute checks. 

## 2015-06-14 NOTE — ED Notes (Signed)
Patient resting in recliner in dayroom. Patient calm. No obvious distress Will continue to monitor

## 2015-06-14 NOTE — ED Notes (Signed)

## 2015-06-14 NOTE — ED Notes (Signed)
Patient sitting in recliner in dayroom. Patient calm at this time. No obvious distress.  

## 2015-06-14 NOTE — ED Notes (Signed)
Dr Real Cons at bedside

## 2015-06-14 NOTE — ED Provider Notes (Signed)
-----------------------------------------   6:26 AM on 06/14/2015 -----------------------------------------   BP 145/74 mmHg  Pulse 106  Temp(Src) 98.4 F (36.9 C) (Oral)  Resp 18  SpO2 100%  The patient had no acute events since last update.  Calm and cooperative at this time.  Disposition is pending per Psychiatry/Behavioral Medicine team recommendations.     Sharman Cheek, MD 06/14/15 [phone]

## 2015-06-14 NOTE — ED Notes (Signed)
Pt. Noted in room. No complaints or concerns voiced. No distress or abnormal behavior noted. Will continue to monitor with security cameras. Q 15 minute rounds continue. 

## 2015-06-15 MED ORDER — MIRTAZAPINE 15 MG PO TABS: 15.0000 mg | ORAL_TABLET | Freq: Every day | ORAL | Status: DC

## 2015-06-15 NOTE — ED Notes (Signed)
Pt. Noted in day room. No complaints or concerns voiced. No distress or abnormal behavior noted. Will continue to monitor with security cameras. Q 15 minute rounds continue. 

## 2015-06-15 NOTE — Discharge Instructions (Signed)
Depression Depression is feeling sad, low, down in the dumps, blue, gloomy, or empty. In general, there are two kinds of depression:  Normal sadness or grief. This can happen after something upsetting. It often goes away on its own within 2 weeks. After losing a loved one (bereavement), normal sadness and grief may last longer than two weeks. It usually gets better with time.  Clinical depression. This kind lasts longer than normal sadness or grief. It keeps you from doing the things you normally do in life. It is often hard to function at home, work, or at school. It may affect your relationships with others. Treatment is often needed. GET HELP RIGHT AWAY IF:  You have thoughts about hurting yourself or others.  You lose touch with reality (psychotic symptoms). You may:  See or hear things that are not real.  Have untrue beliefs about your life or people around you.  Your medicine is giving you problems. MAKE SURE YOU:  Understand these instructions.  Will watch your condition.  Will get help right away if you are not doing well or get worse. Document Released: 01/17/2011 Document Revised: 05/01/2014 Document Reviewed: 04/15/2012 [facility] Patient Information 2015 [place], [place]. This information is not intended to replace advice given to you by your health care provider. Make sure you discuss any questions you have with your health care provider.  Cocaine Cocaine stimulates the central nervous system. As a stimulant, cocaine has the ability to improve athletic performance through increasing speed, endurance, and concentration, as well as decreasing fatigue. Although cocaine may seem to be beneficial for athletics, it is highly addicting and has many debilitating side effects. Cocaine has caused the deaths of many athletes, and its use is banned by every major athletic organization in the world. The clinical effect of cocaine (the high) is very short in duration. Cocaine works in the  brain by altering the normal concentrations of chemicals that stimulate the brain cells.  WHY ATHLETES USE IT  Many athletes use cocaine for its central nervous system stimulating properties. It is also used as a recreational drug due to the euphoric felling it produces.  ADVERSE EFFECTS   Sleep disturbances.  Abnormal heart rhythms.  Stroke.  Heart attack.  Seizures.  Elevated blood pressure.  Death.  Paranoia (feeling that people want to hurt you).  Panic attacks (sudden feelings of anxiety or shortness of breath).  Suicidal behavior (wanting to kill yourself).  Homicidal behavior (wanting to kill other people).  Depression (feeling very sad, having decreased energy for activities).  Poor athletic performance. PHARMACOLOGY  Cocaine acts on the body for a short period of time; the clinical effects may last less than1 hour. Since most athletic competitions last for more than 1 hour, cocaine use may not improve athletic performance. The use of cocaine makes individuals much more susceptible for serious conditions such as seizures, arrhythmia (irregular heart beat), and strokes. Even a single dose of cocaine can be detected on a drug test for up to about 30 hours.  PREVENTION Most athletes use cocaine as a recreational drug and not for the purpose of enhancing athletic performance. To prevent the use of cocaine, athletes must be educated on its side effects and the risk of addiction. If an athlete is found using cocaine, counseling and treatment are almost always required.  Document Released: 12/15/2005 Document Revised: 03/08/2012 Document Reviewed: 03/29/2009 [facility]Exit[facility] Patient Information 2015 [place], [place]. This information is not intended to replace advice given to you by your health care  provider. Make sure you discuss any questions you have with your health care provider. ° °

## 2015-06-15 NOTE — ED Notes (Signed)
Sandwich and soft drink given.  

## 2015-06-15 NOTE — ED Provider Notes (Signed)
-----------------------------------------   6:09 PM on 06/15/2015 -----------------------------------------   BP 110/93 mmHg  Pulse 77  Temp(Src) 98.3 F (36.8 C) (Oral)  Resp 18  SpO2 100%  The patient had no acute events since last update.  Calm and cooperative at this time.  Disposition is pending per Psychiatry/Behavioral Medicine team recommendations.   Discussed with psychiatry Dr. Toni Amend feels the patient is psychiatrically stable. He is prescribing the patient Remeron. We'll discharge the patient to continue on Remeron and follow-up with his care providers at the [facility]. He is medically and psychiatrically stable at this time.  Sharman Cheek, MD 06/15/15 [phone]

## 2015-06-15 NOTE — ED Notes (Signed)
IVC rescinded by Dr.Clapacs patient will be discharged

## 2015-06-15 NOTE — ED Notes (Signed)
Pt sitting in the dayroom

## 2015-06-15 NOTE — ED Notes (Signed)
MD at bedside. 

## 2015-06-15 NOTE — ED Notes (Signed)
Pt. Alert and oriented. Denies problems or complaints. 

## 2015-06-15 NOTE — ED Notes (Signed)
Pt. Noted sleeping in room. No complaints or concerns voiced. No distress or abnormal behavior noted. Will continue to monitor with security cameras. Q 15 minute rounds continue. 

## 2015-06-15 NOTE — ED Notes (Signed)
Breakfast given.  

## 2015-06-15 NOTE — ED Notes (Signed)
Report received from. Pt. Alert and oriented in no distress denies SI, HI, AVH and pain.  Pt. Instructed to come to me with problems or concerns.Will continue to monitor for safety via security cameras and Q 15 minute checks. 

## 2015-06-15 NOTE — ED Notes (Signed)
Pt showered completed , currently sitting in dayroom with other pts , calm/cooperative

## 2015-06-15 NOTE — ED Notes (Signed)
Report received from Gary- RN.

## 2015-06-15 NOTE — ED Notes (Signed)
Pt. Noted in dayroom watching TV. No complaints or concerns voiced. No distress or abnormal behavior noted. Will continue to monitor with security cameras. Q 15 minute rounds continue.

## 2015-06-15 NOTE — ED Notes (Signed)
Pt. Noted in room. No complaints or concerns voiced. No distress or abnormal behavior noted. Will continue to monitor with security cameras. Q 15 minute rounds continue. 

## 2015-06-15 NOTE — ED Notes (Signed)
Pt calm / cooperative , pt discharged

## 2015-06-15 NOTE — BHH Counselor (Signed)
Writer followed with [facility] ([phone]), pending review. Per [facility], expect a phone call by the end of the day.

## 2015-06-15 NOTE — ED Provider Notes (Signed)
-----------------------------------------   8:20 AM on 06/15/2015 -----------------------------------------   BP 103/53 mmHg  Pulse 58  Temp(Src) 98.9 F (37.2 C) (Oral)  Resp 18  SpO2 99%  The patient had no acute events since last update.  Calm and cooperative at this time.  Initially presented for SI after an alcohol and cocaine binge. Patient also states he has been hearing voices with command hallucinations telling him to kill himself. Patient is medically clear at this time awaiting psychiatric disposition/placement.     Minna Antis, MD 06/15/15 [phone]

## 2017-08-29 ENCOUNTER — Emergency Department: Payer: Self-pay

## 2017-08-29 ENCOUNTER — Emergency Department
Admission: EM | Admit: 2017-08-29 | Discharge: 2017-08-31 | Disposition: A | Discharge status: Other Acute Inpatient Hospital | Payer: Self-pay | Attending: Emergency Medicine | Admitting: Emergency Medicine

## 2017-08-29 ENCOUNTER — Encounter: Payer: Self-pay | Admitting: Emergency Medicine

## 2017-08-29 DIAGNOSIS — Z87891 Personal history of nicotine dependence: Secondary | ICD-10-CM | POA: Insufficient documentation

## 2017-08-29 DIAGNOSIS — F29 Unspecified psychosis not due to a substance or known physiological condition: Secondary | ICD-10-CM | POA: Insufficient documentation

## 2017-08-29 DIAGNOSIS — R4182 Altered mental status, unspecified: Secondary | ICD-10-CM | POA: Insufficient documentation

## 2017-08-29 DIAGNOSIS — I10 Essential (primary) hypertension: Secondary | ICD-10-CM | POA: Insufficient documentation

## 2017-08-29 LAB — CBC
HCT: 49.8 % (ref 40.0–52.0)
HEMOGLOBIN: 16.9 g/dL (ref 13.0–18.0)
MCH: 31.4 pg (ref 26.0–34.0)
MCHC: 33.8 g/dL (ref 32.0–36.0)
MCV: 93 fL (ref 80.0–100.0)
PLATELETS: 210 10*3/uL (ref 150–440)
RBC: 5.36 MIL/uL (ref 4.40–5.90)
RDW: 13.4 % (ref 11.5–14.5)
WBC: 12.7 10*3/uL — ABNORMAL HIGH (ref 3.8–10.6)

## 2017-08-29 LAB — COMPREHENSIVE METABOLIC PANEL
ALT: 28 U/L (ref 17–63)
ANION GAP: 15 (ref 5–15)
AST: 50 U/L — ABNORMAL HIGH (ref 15–41)
Albumin: 5.3 g/dL — ABNORMAL HIGH (ref 3.5–5.0)
Alkaline Phosphatase: 68 U/L (ref 38–126)
BUN: 13 mg/dL (ref 6–20)
CHLORIDE: 97 mmol/L — AB (ref 101–111)
CO2: 21 mmol/L — AB (ref 22–32)
Calcium: 10.4 mg/dL — ABNORMAL HIGH (ref 8.9–10.3)
Creatinine, Ser: 1.78 mg/dL — ABNORMAL HIGH (ref 0.61–1.24)
GFR, EST AFRICAN AMERICAN: 56 mL/min — AB (ref >60)
GFR, EST NON AFRICAN AMERICAN: 49 mL/min — AB (ref >60)
Glucose, Bld: 103 mg/dL — ABNORMAL HIGH (ref 65–99)
POTASSIUM: 3.5 mmol/L (ref 3.5–5.1)
SODIUM: 133 mmol/L — AB (ref 135–145)
Total Bilirubin: 1.1 mg/dL (ref 0.3–1.2)
Total Protein: 9.4 g/dL — ABNORMAL HIGH (ref 6.5–8.1)

## 2017-08-29 LAB — TROPONIN I: Troponin I: <0.03 ng/mL (ref <0.03)

## 2017-08-29 LAB — ETHANOL: ALCOHOL ETHYL (B): 7 mg/dL — AB (ref <5)

## 2017-08-29 LAB — SALICYLATE LEVEL: Salicylate Lvl: <7 mg/dL (ref 2.8–30.0)

## 2017-08-29 LAB — ACETAMINOPHEN LEVEL

## 2017-08-29 MED ORDER — LORAZEPAM 2 MG PO TABS
2.0000 mg | ORAL_TABLET | Freq: Once | ORAL | Status: AC
  Administered 2017-08-29: 2 mg via ORAL
  Filled 2017-08-29: qty 1

## 2017-08-29 NOTE — ED Notes (Signed)
ENVIRONMENTAL ASSESSMENT  Potentially harmful objects out of patient reach: Yes.  Personal belongings secured: Yes.  Patient dressed in hospital provided attire only: Yes.  Plastic bags out of patient reach: Yes.  Patient care equipment (cords, cables, call bells, lines, and drains) shortened, removed, or accounted for: Yes.  Equipment and supplies removed from bottom of stretcher: Yes.  Potentially toxic materials out of patient reach: Yes.  Sharps container removed or out of patient reach: Yes.   BEHAVIORAL HEALTH ROUNDING  Patient sleeping: No.  Patient alert and oriented: yes  Behavior appropriate: no ; If no, describe: paranoid  Nutrition and fluids offered: Yes  Toileting and hygiene offered: Yes  Sitter present: not applicable, Q 15 min safety rounds and observation.  Law enforcement present: Yes ODS

## 2017-08-29 NOTE — ED Triage Notes (Signed)
Pt arrives via [employer] with c/o "not feeling well". Pt repeats statements about "I just want him to get his stuff" but when asked cannot give an answer as to who needs his stuff. Pt reports cocaine and marijuana use around an hour ago and is continuing to glance at doorways looking for or at something. Pt is figiting in triage.

## 2017-08-29 NOTE — ED Notes (Addendum)
Black T shirt, tan shorts, tan belt and black tennis shoes removed from patient and placed in belongings bag.black baseball cap, key ring, pack of cigarettes, lighter and yellow color metal necklace.  Labeled and placed in lock up.

## 2017-08-29 NOTE — ED Provider Notes (Signed)
[facility] Emergency Department Provider Note   ____________________________________________   First MD Initiated Contact with Patient 08/29/17 2311     (approximate)  I have reviewed the triage vital signs and the nursing notes.   HISTORY  Chief Complaint Altered Mental Status  Limited by intoxication and agitation  HPI Jeremy Frank is a 31 y.o. male brought to the ED by [employer] with a chief complaint of altered mental status in the setting of heavy cocaine and marijuana use. Patient reports using "a lot" of cocaine approximately one hour ago. Complains of "not feeling well". Other than indicating headache, patient is vague regarding his medical symptoms. Seems concerned about "his stuff" seeming to indicate another person. Appears paranoid; persistently asked "Am I safe?"Rest of history is unobtainable secondary to patient's agitation and altered mental state.   Past Medical History:  Diagnosis Date  . Drug dependence (HCC)   . GI bleed   . Hypertension   . PTSD (post-traumatic stress disorder)     Patient Active Problem List   Diagnosis Date Noted  . Psychosis 06/13/2015  . Posttraumatic stress disorder 06/13/2015  . 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 Cocaine dependence (HCC) 06/13/2015  . Marijuana abuse 06/13/2015    History reviewed. No pertinent surgical history.  Prior to Admission medications   Medication Sig Start Date End Date Taking? Authorizing Provider  mirtazapine (REMERON) 15 MG tablet Take 1 tablet (15 mg total) by mouth at bedtime. 06/15/15   Clapacs, Jackquline Denmark, MD    Allergies Patient has no known allergies.  No family history on file.  Social History Social History  Substance Use Topics  . Smoking status: Former Smoker    Types: Cigarettes  . Smokeless tobacco: Never Used  . Alcohol use Yes    Review of Systems  Constitutional: No fever/chills. Eyes: No visual changes. ENT: No sore  throat. Cardiovascular: Denies chest pain. Respiratory: Denies shortness of breath. Gastrointestinal: No abdominal pain.  No nausea, no vomiting.  No diarrhea.  No constipation. Genitourinary: Negative for dysuria. Musculoskeletal: Negative for back pain. Skin: Negative for rash. Neurological: positive for headache. Negative for focal weakness or numbness. Psychiatric:positive for paranoia and agitation.  ____________________________________________   PHYSICAL EXAM:  VITAL SIGNS: ED Triage Vitals  Enc Vitals Group     BP 08/29/17 2239 (!) 174/106     Pulse Rate 08/29/17 2239 (!) 111     Resp 08/29/17 2239 18     Temp 08/29/17 2239 98.4 F (36.9 C)     Temp Source 08/29/17 2239 Oral     SpO2 08/29/17 2239 100 %     Weight 08/29/17 2235 200 lb (90.7 kg)     Height 08/29/17 2235 5\' 11"  (1.803 m)     Head Circumference --      Peak Flow --      Pain Score 08/29/17 2233 0     Pain Loc --      Pain Edu? --      Excl. in GC? --     Constitutional: Alert, confused. Well appearing and in mild acute distress. Eyes: Conjunctivae are normal. PERRL. EOMI. Head: Atraumatic. Nose: No congestion/rhinnorhea. Mouth/Throat: Mucous membranes are moist.  Oropharynx non-erythematous. Neck: No stridor.  No carotid bruits. Cardiovascular: tachycardic rate, regular rhythm. Grossly normal heart sounds.  Good peripheral circulation. Respiratory: Normal respiratory effort.  No retractions. Lungs CTAB. Gastrointestinal: Soft and nontender. No distention. No abdominal bruits. No CVA tenderness. Musculoskeletal: No lower extremity tenderness  nor edema.  No joint effusions. Neurologic:  Appears dazed. Normal speech and language. No gross focal neurologic deficits are appreciated. No gait instability. Skin:  Skin is warm, dry and intact. No rash noted. Psychiatric: Mood and affect are flat, paranoid. Speech and behavior are delayed.  ____________________________________________   LABS (all labs  ordered are listed, but only abnormal results are displayed)  Labs Reviewed  COMPREHENSIVE METABOLIC PANEL - Abnormal; Notable for the following:       Result Value   Sodium 133 (*)    Chloride 97 (*)    CO2 21 (*)    Glucose, Bld 103 (*)    Creatinine, Ser 1.78 (*)    Calcium 10.4 (*)    Total Protein 9.4 (*)    Albumin 5.3 (*)    AST 50 (*)    GFR calc non Af Amer 49 (*)    GFR calc Af Amer 56 (*)    All other components within normal limits  ETHANOL - Abnormal; Notable for the following:    Alcohol, Ethyl (B) 7 (*)    All other components within normal limits  CBC - Abnormal; Notable for the following:    WBC 12.7 (*)    All other components within normal limits  URINE DRUG SCREEN, QUALITATIVE ([facility] ONLY) - Abnormal; Notable for the following:    Amphetamines, Ur Screen POSITIVE (*)    Cocaine Metabolite,Ur [place] POSITIVE (*)    Cannabinoid 50 Ng, Ur [place] POSITIVE (*)    All other components within normal limits  ACETAMINOPHEN LEVEL - Abnormal; Notable for the following:    Acetaminophen (Tylenol), Serum <10 (*)    All other components within normal limits  URINALYSIS, COMPLETE (UACMP) WITH MICROSCOPIC - Abnormal; Notable for the following:    Color, Urine YELLOW (*)    APPearance CLEAR (*)    Ketones, ur 5 (*)    Bacteria, UA RARE (*)    Squamous Epithelial / LPF 0-5 (*)    All other components within normal limits  CK - Abnormal; Notable for the following:    Total CK 439 (*)    All other components within normal limits  SALICYLATE LEVEL  TROPONIN I   ____________________________________________  EKG  ED ECG REPORT I, Ruthel Martine J, the attending physician, personally viewed and interpreted this ECG.   Date: 08/29/2017  EKG Time: 2324  Rate: 111  Rhythm: sinus tachycardia  Axis: RAD  Intervals:none  ST&T Change: nonspecific  ____________________________________________  RADIOLOGY  Ct Head Wo Contrast  Result Date: 08/30/2017 CLINICAL DATA:  31 y/o  M;  altered mental status. EXAM: CT HEAD WITHOUT CONTRAST TECHNIQUE: Contiguous axial images were obtained from the base of the skull through the vertex without intravenous contrast. COMPARISON:  None. FINDINGS: Brain: No evidence of acute infarction, hemorrhage, hydrocephalus, extra-axial collection or mass lesion/mass effect. Vascular: No hyperdense vessel or unexpected calcification. Skull: Normal. Negative for fracture or focal lesion. Sinuses/Orbits: No acute finding. Other: None. IMPRESSION: Normal CT of the head. Electronically Signed   By: Mitzi Hansen M.D.   On: 08/30/2017 02:35   Dg Chest Port 1 View  Result Date: 08/30/2017 CLINICAL DATA:  31 y/o  M; altered mental status. EXAM: PORTABLE CHEST 1 VIEW COMPARISON:  None. FINDINGS: The heart size and mediastinal contours are within normal limits. Both lungs are clear. The visualized skeletal structures are unremarkable. IMPRESSION: No active disease. Electronically Signed   By: Mitzi Hansen M.D.   On: 08/30/2017 01:49  ____________________________________________   PROCEDURES  Procedure(s) performed: None  Procedures  Critical Care performed: No  ____________________________________________   INITIAL IMPRESSION / ASSESSMENT AND PLAN / ED COURSE  Pertinent labs & imaging results that were available during my care of the patient were reviewed by me and considered in my medical decision making (see chart for details).  31 year old male who presents with altered mentation, paranoia, agitation in the setting of recent heavy cocaine and marijuana use. Patient unwilling or unable to answer queries regarding self-harm. Given his paranoid and agitated state, will place patient under involuntary commitment for his safety. Ativan for anxiolytic while patient undergoes medical evaluation for organic causes of his altered mental state.  Clinical Course as of Aug 31 703  Wynelle Link Aug 30, 2017  0010 Patient uncooperative for  CT scan. Willingly took oral Ativan. Will administer IM calming agents.  [JS]  0144 patient was evaluated by [facility] psychiatrist Dr. Garnetta Buddy who agrees with maintaining patient's IVC and admission to inpatient psychiatry service. Will order medication recommendations.  [JS]  0304 CT head negative for intracranial hemorrhage. Reviewed laboratory urinalysis results. Patient is medically cleared for psychiatric disposition. He may be moved to the BHU once he is awake and ambulatory.  [JS]    Clinical Course User Index [JS] Irean Hong, MD     ____________________________________________   FINAL CLINICAL IMPRESSION(S) / ED DIAGNOSES  Final diagnoses:  Altered mental status, unspecified altered mental status type  Psychosis, unspecified psychosis type      NEW MEDICATIONS STARTED DURING THIS VISIT:  New Prescriptions   No medications on file     Note:  This document was prepared using Dragon voice recognition software and may include unintentional dictation errors.    Irean Hong, MD 08/30/17 [phone]

## 2017-08-30 ENCOUNTER — Emergency Department: Payer: Self-pay

## 2017-08-30 LAB — URINALYSIS, COMPLETE (UACMP) WITH MICROSCOPIC
Bilirubin Urine: NEGATIVE
Glucose, UA: NEGATIVE mg/dL
Hgb urine dipstick: NEGATIVE
Ketones, ur: 5 mg/dL — AB
Leukocytes, UA: NEGATIVE
Nitrite: NEGATIVE
PH: 6 (ref 5.0–8.0)
Protein, ur: NEGATIVE mg/dL
SPECIFIC GRAVITY, URINE: 1.006 (ref 1.005–1.030)

## 2017-08-30 LAB — URINE DRUG SCREEN, QUALITATIVE (ARMC ONLY)
Amphetamines, Ur Screen: POSITIVE — AB
BARBITURATES, UR SCREEN: NOT DETECTED
BENZODIAZEPINE, UR SCRN: NOT DETECTED
CANNABINOID 50 NG, UR ~~LOC~~: POSITIVE — AB
Cocaine Metabolite,Ur ~~LOC~~: POSITIVE — AB
MDMA (Ecstasy)Ur Screen: NOT DETECTED
Methadone Scn, Ur: NOT DETECTED
Opiate, Ur Screen: NOT DETECTED
PHENCYCLIDINE (PCP) UR S: NOT DETECTED
Tricyclic, Ur Screen: NOT DETECTED

## 2017-08-30 LAB — CK: CK TOTAL: 439 U/L — AB (ref 49–397)

## 2017-08-30 MED ORDER — ZIPRASIDONE HCL 20 MG PO CAPS
20.0000 mg | ORAL_CAPSULE | Freq: Two times a day (BID) | ORAL | Status: DC
  Administered 2017-08-30 – 2017-08-31 (×3): 20 mg via ORAL
  Filled 2017-08-30 (×3): qty 1

## 2017-08-30 MED ORDER — MIRTAZAPINE 15 MG PO TBDP
15.0000 mg | ORAL_TABLET | Freq: Every day | ORAL | Status: DC
Start: 2017-08-30 — End: 2017-08-31
  Administered 2017-08-30: 15 mg via ORAL
  Filled 2017-08-30 (×2): qty 1

## 2017-08-30 MED ORDER — DIVALPROEX SODIUM 500 MG PO DR TAB
500.0000 mg | DELAYED_RELEASE_TABLET | Freq: Every day | ORAL | Status: DC
Start: 2017-08-30 — End: 2017-08-31
  Administered 2017-08-30: 500 mg via ORAL
  Filled 2017-08-30: qty 1

## 2017-08-30 MED ORDER — HALOPERIDOL LACTATE 5 MG/ML IJ SOLN
5.0000 mg | Freq: Once | INTRAMUSCULAR | Status: DC
  Filled 2017-08-30: qty 1

## 2017-08-30 MED ORDER — SODIUM CHLORIDE 0.9 % IV BOLUS (SEPSIS)
1000.0000 mL | Freq: Once | INTRAVENOUS | Status: AC
  Administered 2017-08-30: 1000 mL via INTRAVENOUS

## 2017-08-30 MED ORDER — DIPHENHYDRAMINE HCL 50 MG/ML IJ SOLN
12.5000 mg | Freq: Once | INTRAMUSCULAR | Status: AC
  Administered 2017-08-30: 12.5 mg via INTRAVENOUS

## 2017-08-30 MED ORDER — DIPHENHYDRAMINE HCL 50 MG/ML IJ SOLN
25.0000 mg | Freq: Once | INTRAMUSCULAR | Status: DC
  Filled 2017-08-30: qty 1

## 2017-08-30 MED ORDER — HALOPERIDOL LACTATE 5 MG/ML IJ SOLN
5.0000 mg | Freq: Once | INTRAMUSCULAR | Status: AC
  Administered 2017-08-30: 5 mg via INTRAVENOUS

## 2017-08-30 NOTE — ED Notes (Signed)
BEHAVIORAL HEALTH ROUNDING  Patient sleeping: No.  Patient alert and oriented: yes  Behavior appropriate: no. ; If no, describe: paranoid  Nutrition and fluids offered: Yes  Toileting and hygiene offered: Yes  Sitter present: not applicable, Q 15 min safety rounds and observation.  Law enforcement present: Yes ODS  

## 2017-08-30 NOTE — BH Assessment (Signed)
Spoke with Chief of Staff at [facility].  They are currently not on diversion.  Instructed to fax paperwork.

## 2017-08-30 NOTE — ED Notes (Signed)
BEHAVIORAL HEALTH ROUNDING Patient sleeping: Yes.   Patient alert and oriented: not applicable SLEEPING Behavior appropriate: Yes.  ; If no, describe: SLEEPING Nutrition and fluids offered: No SLEEPING Toileting and hygiene offered: NoSLEEPING Sitter present: not applicable, Q 15 min safety rounds and observation. Law enforcement present: Yes ODS 

## 2017-08-30 NOTE — ED Notes (Signed)
Report given to Dr Garnetta Buddy from [facility].

## 2017-08-30 NOTE — ED Notes (Signed)
Patient given dinner. Pt compliant with medication

## 2017-08-30 NOTE — BHH Counselor (Signed)
Writer called to informed [facility] ([phone] ext. [phone]) the patient will not transport tonight do to a lack to transportation from the [facility]. He advised to call back in the morning (09/03/208) after 8am to for further instructions on what to do.

## 2017-08-30 NOTE — ED Notes (Signed)
Patient easily aroused from sleep for breakfast. Pt appears somewhat guarded, but reports 'feeling better' and currently denies SI/HI at this time. Pt is calm and cooperative and compliant with meds. Pt encouraged to voice concerns and ask questions. Pt remains safe with 15 minute checks.

## 2017-08-30 NOTE — BH Assessment (Signed)
Writer faxed referral packet to [facility] ([phone]. [phone]) and confirmed it was received. Currently pending review with their psychiatrist.

## 2017-08-30 NOTE — ED Notes (Addendum)
Patient resting quietly with eyes closed. Pt informed that lunch was sitting beside his bed and that it would get cold fast. Pt acknowledged this, but proceeded to lay back down to sleep.
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
## 2017-08-30 NOTE — ED Notes (Signed)
BEHAVIORAL HEALTH ROUNDING  Patient sleeping: No.  Patient alert and oriented: yes  Behavior appropriate: no. ; If no, describe: paranoid  Nutrition and fluids offered: Yes  Toileting and hygiene offered: Yes  Sitter present: not applicable, Q 15 min safety rounds and observation.  Law enforcement present: Yes ODS

## 2017-08-30 NOTE — ED Notes (Signed)
Patient oriented to unit.  Patient denies SI/HI/AVH.  No distress noted.

## 2017-08-30 NOTE — ED Notes (Signed)
For milieu management, another patient needed to be moved to small area on the unit; therefore staff moved patient  to room 1.  Patient calm and cooperative.  No distress noted.  Patient lying in bed, eyes closed, respirations even and unlabored.

## 2017-08-30 NOTE — BH Assessment (Signed)
Patient has been accepted to [facility].  Patient is to go through the ER at the [facility]. Accepting physician is Dr. Sherren Mocha.  Call report to [phone] ext. [phone].  Representative was [employer].  ER Staff is aware of it Kennith Center, ER Sect.; Dr. Scotty Court, ER MD & Vikki Ports, Patient's Nurse)   Patient will need to be there before 10:00pm.  If he is not there before that time, he will not be accepted. [facility] will have to call back tomorrow (08/31/2017) after 8am for further instructions.

## 2017-08-31 NOTE — ED Notes (Signed)
Patient alert and oriented, pleasant , cooperative, patient's transport here, law enforcement, patient talked with them and ask if He had to go and they told him about the IVC paper work and He left peaceable.

## 2017-08-31 NOTE — ED Notes (Signed)
EMTALA reviewed by this RN.  

## 2017-08-31 NOTE — ED Notes (Signed)
Patient left with law enforcement, no signs of distress, no behavior problems, all belongings given back to patient.

## 2017-08-31 NOTE — BH Assessment (Signed)
[phone] ext [phone]) to confirm pt was still accepted despite transportation barrier on last night. Representative Reita Cliche) state pt still has bed. Report should be called to [phone] ext. [phone]. Pt RN Toniann Fail) informed.

## 2017-08-31 NOTE — ED Notes (Signed)
Patient denies Si/hi or avh, patient is pleasant and cooperative, states " I really need to leave so I can pay my rent, and I don't feel that I need to go to [place]" Patient did want to take a shower, and nurse gave him hygiene items, patient without behavioral issues noted, q 15 minute checks and camera monitoring for safety.

## 2017-08-31 NOTE — ED Notes (Signed)
[facility], and could not get answer, will try again. Patient is resting, no evidence of distress, no behavioral issues. q 15 minute checks and camera surveillance in progress for safety.

## 2017-08-31 NOTE — ED Notes (Signed)
[facility].

## 2017-08-31 NOTE — ED Provider Notes (Signed)
-----------------------------------------   6:05 AM on 08/31/2017 -----------------------------------------   Blood pressure 118/63, pulse 89, temperature 98.2 F (36.8 C), temperature source Oral, resp. rate 18, height 5\' 11"  (1.803 m), weight 90.7 kg (200 lb), SpO2 100 %.  The patient had no acute events since last update.  Sleeping at this time.  Has been accepted to the [facility] pending transportation.     Irean Hong, MD 08/31/17 [phone]

## 2017-08-31 NOTE — ED Notes (Signed)
Patient ate 100% of breakfast and beverage.  

## 2018-03-23 ENCOUNTER — Other Ambulatory Visit: Payer: Self-pay

## 2018-03-23 ENCOUNTER — Emergency Department
Admission: EM | Admit: 2018-03-23 | Discharge: 2018-03-24 | Disposition: A | Discharge status: Home / Self Care | Payer: Self-pay | Attending: Emergency Medicine | Admitting: Emergency Medicine

## 2018-03-23 ENCOUNTER — Emergency Department: Payer: Self-pay

## 2018-03-23 DIAGNOSIS — I10 Essential (primary) hypertension: Secondary | ICD-10-CM | POA: Insufficient documentation

## 2018-03-23 DIAGNOSIS — F1910 Other psychoactive substance abuse, uncomplicated: Secondary | ICD-10-CM | POA: Insufficient documentation

## 2018-03-23 DIAGNOSIS — K6289 Other specified diseases of anus and rectum: Secondary | ICD-10-CM

## 2018-03-23 DIAGNOSIS — L0390 Cellulitis, unspecified: Secondary | ICD-10-CM

## 2018-03-23 DIAGNOSIS — L03818 Cellulitis of other sites: Secondary | ICD-10-CM | POA: Insufficient documentation

## 2018-03-23 DIAGNOSIS — Z87891 Personal history of nicotine dependence: Secondary | ICD-10-CM | POA: Insufficient documentation

## 2018-03-23 DIAGNOSIS — Z79899 Other long term (current) drug therapy: Secondary | ICD-10-CM | POA: Insufficient documentation

## 2018-03-23 HISTORY — DX: Diaphragmatic hernia without obstruction or gangrene: K44.9

## 2018-03-23 LAB — COMPREHENSIVE METABOLIC PANEL
ALT: 16 U/L — ABNORMAL LOW (ref 17–63)
ANION GAP: 16 — AB (ref 5–15)
AST: 45 U/L — ABNORMAL HIGH (ref 15–41)
Albumin: 4.9 g/dL (ref 3.5–5.0)
Alkaline Phosphatase: 66 U/L (ref 38–126)
BUN: 17 mg/dL (ref 6–20)
CO2: 19 mmol/L — AB (ref 22–32)
Calcium: 9.5 mg/dL (ref 8.9–10.3)
Chloride: 101 mmol/L (ref 101–111)
Creatinine, Ser: 1.81 mg/dL — ABNORMAL HIGH (ref 0.61–1.24)
GFR calc non Af Amer: 48 mL/min — ABNORMAL LOW (ref >60)
GFR, EST AFRICAN AMERICAN: 55 mL/min — AB (ref >60)
Glucose, Bld: 110 mg/dL — ABNORMAL HIGH (ref 65–99)
Potassium: 3.5 mmol/L (ref 3.5–5.1)
SODIUM: 136 mmol/L (ref 135–145)
TOTAL PROTEIN: 8.3 g/dL — AB (ref 6.5–8.1)
Total Bilirubin: 1.2 mg/dL (ref 0.3–1.2)

## 2018-03-23 LAB — CBC WITH DIFFERENTIAL/PLATELET
Basophils Absolute: 0.1 10*3/uL (ref 0–0.1)
Basophils Relative: 1 %
Eosinophils Absolute: 0.1 10*3/uL (ref 0–0.7)
Eosinophils Relative: 1 %
HEMATOCRIT: 42.2 % (ref 40.0–52.0)
Hemoglobin: 14 g/dL (ref 13.0–18.0)
Lymphocytes Relative: 13 %
Lymphs Abs: 2.1 10*3/uL (ref 1.0–3.6)
MCH: 29.5 pg (ref 26.0–34.0)
MCHC: 33.2 g/dL (ref 32.0–36.0)
MCV: 89 fL (ref 80.0–100.0)
MONO ABS: 1.3 10*3/uL — AB (ref 0.2–1.0)
MONOS PCT: 8 %
NEUTROS ABS: 12.8 10*3/uL — AB (ref 1.4–6.5)
Neutrophils Relative %: 77 %
Platelets: 195 10*3/uL (ref 150–440)
RBC: 4.74 MIL/uL (ref 4.40–5.90)
RDW: 13.4 % (ref 11.5–14.5)
WBC: 16.4 10*3/uL — ABNORMAL HIGH (ref 3.8–10.6)

## 2018-03-23 LAB — URINALYSIS, ROUTINE W REFLEX MICROSCOPIC
BILIRUBIN URINE: NEGATIVE
Glucose, UA: NEGATIVE mg/dL
Hgb urine dipstick: NEGATIVE
Ketones, ur: 5 mg/dL — AB
Leukocytes, UA: NEGATIVE
Nitrite: NEGATIVE
PROTEIN: NEGATIVE mg/dL
Specific Gravity, Urine: 1.012 (ref 1.005–1.030)
pH: 5 (ref 5.0–8.0)

## 2018-03-23 LAB — ETHANOL

## 2018-03-23 LAB — LIPASE, BLOOD: Lipase: 26 U/L (ref 11–51)

## 2018-03-23 MED ORDER — SODIUM CHLORIDE 0.9 % IV BOLUS
2000.0000 mL | INTRAVENOUS | Status: AC
  Administered 2018-03-23: 2000 mL via INTRAVENOUS

## 2018-03-23 MED ORDER — CEPHALEXIN 500 MG PO CAPS
500.0000 mg | ORAL_CAPSULE | Freq: Four times a day (QID) | ORAL | 0 refills | Status: DC
Start: 2018-03-23 — End: 2020-10-19

## 2018-03-23 MED ORDER — IOPAMIDOL (ISOVUE-300) INJECTION 61%
100.0000 mL | Freq: Once | INTRAVENOUS | Status: AC | PRN
  Administered 2018-03-23: 100 mL via INTRAVENOUS

## 2018-03-23 MED ORDER — IOPAMIDOL (ISOVUE-300) INJECTION 61%
30.0000 mL | Freq: Once | INTRAVENOUS | Status: AC
  Administered 2018-03-23: 30 mL via ORAL

## 2018-03-23 MED ORDER — SODIUM CHLORIDE 0.9 % IV SOLN
1.0000 g | INTRAVENOUS | Status: AC
  Administered 2018-03-23: 1 g via INTRAVENOUS
  Filled 2018-03-23: qty 10

## 2018-03-23 MED ORDER — ONDANSETRON HCL 4 MG/2ML IJ SOLN
4.0000 mg | INTRAMUSCULAR | Status: AC
  Administered 2018-03-23: 4 mg via INTRAVENOUS
  Filled 2018-03-23: qty 2

## 2018-03-23 MED ORDER — MORPHINE SULFATE (PF) 4 MG/ML IV SOLN
4.0000 mg | Freq: Once | INTRAVENOUS | Status: AC
  Administered 2018-03-23: 4 mg via INTRAVENOUS
  Filled 2018-03-23: qty 1

## 2018-03-23 NOTE — ED Notes (Addendum)
Pt asked about rectal pain, pt states "I heard my abs pop then it felt like something was coming out." Pt states he stills has intermittent rectal pain and protrusion feeling. Pt denies insertion, inj or trauma to rectum. Pt states he had a "hard" bowel movement today.

## 2018-03-23 NOTE — ED Notes (Addendum)
Pt reports he drank alcohol("12 beers" per pt), smoked marijuana and cocaine earlier today. EDP aware, no new orders at this time.

## 2018-03-23 NOTE — ED Provider Notes (Signed)
[facility] Emergency Department Provider Note  ____________________________________________   First MD Initiated Contact with Patient 03/23/18 2005     (approximate)  I have reviewed the triage vital signs and the nursing notes.   HISTORY  Chief Complaint Hernia   History is limited due to the patient being a vague and inconsistent historian, questionable psychiatric component.   HPI Jeremy Frank is a 32 y.o. male with medical history as listed below who presents by EMS for evaluation of rectal pain.  He states that he was incarcerated about 4 days ago and he injured himself while lifting weights.  He states that his guts fell out of his anus a little bit, but he became much worse today while he was straining to have a bowel movement.  Straining makes it worse and nothing makes it better.  He is perseverating about the issue and will not pull his hands out of the anal region, stating that he has to keep his gotten in.  He reports pain all throughout his abdomen and his rectum as well as his groin.  He does not have any swelling in his groin or his genitals and says that his testicles do not hurt, it seems to be more on the inside and the back.  His story is somewhat inconsistent and difficult to identify exactly the timeframe and the main issue.  He denies fever/chills, chest pain, shortness of breath, nausea, vomiting.  Past Medical History:  Diagnosis Date  . Drug dependence (HCC)   . GI bleed   . Hiatal hernia   . Hypertension   . PTSD (post-traumatic stress disorder)     Patient Active Problem List   Diagnosis Date Noted  . Psychosis (HCC) 06/13/2015  . Posttraumatic stress disorder 06/13/2015  . 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 Cocaine dependence (HCC) 06/13/2015  . Marijuana abuse 06/13/2015    Past Surgical History:  Procedure Laterality Date  . KNEE SURGERY      Prior to Admission medications   Medication Sig Start Date  End Date Taking? Authorizing Provider  cephALEXin (KEFLEX) 500 MG capsule Take 1 capsule (500 mg total) by mouth 4 (four) times daily. 03/23/18   Loleta Rose, MD  cephALEXin (KEFLEX) 500 MG capsule Take 1 capsule (500 mg total) by mouth 2 (two) times daily for 10 days. 03/24/18 04/03/18  Darci Current, MD  mirtazapine (REMERON) 15 MG tablet Take 1 tablet (15 mg total) by mouth at bedtime. 06/15/15   Clapacs, Jackquline Denmark, MD    Allergies Patient has no known allergies.  No family history on file.  Social History Social History   Tobacco Use  . Smoking status: Former Smoker    Types: Cigarettes  . Smokeless tobacco: Never Used  Substance Use Topics  . Alcohol use: Yes  . Drug use: Yes    Types: Cocaine, Marijuana    Review of Systems History is limited due to the patient being a vague and inconsistent historian, questionable psychiatric component.  Constitutional: No fever/chills Eyes: No visual changes. ENT: No sore throat. Cardiovascular: Denies chest pain. Respiratory: Denies shortness of breath. Gastrointestinal/Rectal: Abdominal and rectal pain as described above, patient stating "my guts are falling out" Genitourinary: Negative for dysuria. Musculoskeletal: Negative for neck pain.  Negative for back pain. Integumentary: Negative for rash. Neurological: Negative for headaches, focal weakness or numbness.   ____________________________________________   PHYSICAL EXAM:  VITAL SIGNS: ED Triage Vitals  Enc Vitals Group  BP 03/23/18 2009 (!) 160/119     Pulse Rate 03/23/18 2009 (!) 124     Resp 03/23/18 2009 20     Temp 03/23/18 2009 (!) 100.9 F (38.3 C)     Temp Source 03/23/18 2009 Oral     SpO2 03/23/18 2009 97 %     Weight 03/23/18 2011 89.4 kg (197 lb)     Height 03/23/18 2011 1.803 m (5\' 11" )     Head Circumference --      Peak Flow --      Pain Score 03/23/18 2010 10     Pain Loc --      Pain Edu? --      Excl. in GC? --     Constitutional: Alert  and oriented, diaphoretic, odd behavior and affect Eyes: Conjunctivae are normal.  Head: Atraumatic. Nose: No congestion/rhinnorhea. Mouth/Throat: Mucous membranes are moist. Neck: No stridor.  No meningeal signs.   Cardiovascular: Tachycardia, regular rhythm. Good peripheral circulation. Grossly normal heart sounds. Respiratory: Normal respiratory effort.  No retractions. Lungs CTAB. Gastrointestinal: Soft with mild and diffuse tenderness throughout the abdomen. Rectal: Normal external rectal exam with no evidence of rectal prolapse nor hemorrhoids. Genitourinary: Normal external circumcised male genitalia with no tenderness to palpation of the testicles, no swelling in the scrotum, no obvious injury or abnormality to penis. Musculoskeletal: No lower extremity tenderness nor edema. No gross deformities of extremities. Neurologic:  Normal speech and language. No gross focal neurologic deficits are appreciated.  Skin:  Skin is warm, diaphoretic and intact. No rash noted. Psychiatric: Mood and affect are odd and patient is a vague historian.  He does endorse recent drug and alcohol use.  ____________________________________________   LABS (all labs ordered are listed, but only abnormal results are displayed)  Labs Reviewed  CBC WITH DIFFERENTIAL/PLATELET - Abnormal; Notable for the following components:      Result Value   WBC 16.4 (*)    Neutro Abs 12.8 (*)    Monocytes Absolute 1.3 (*)    All other components within normal limits  COMPREHENSIVE METABOLIC PANEL - Abnormal; Notable for the following components:   CO2 19 (*)    Glucose, Bld 110 (*)    Creatinine, Ser 1.81 (*)    Total Protein 8.3 (*)    AST 45 (*)    ALT 16 (*)    GFR calc non Af Amer 48 (*)    GFR calc Af Amer 55 (*)    Anion gap 16 (*)    All other components within normal limits  URINALYSIS, ROUTINE W REFLEX MICROSCOPIC - Abnormal; Notable for the following components:   Color, Urine YELLOW (*)    APPearance  CLEAR (*)    Ketones, ur 5 (*)    All other components within normal limits  URINE DRUG SCREEN, QUALITATIVE ([facility] ONLY) - Abnormal; Notable for the following components:   Cocaine Metabolite,Ur [place] POSITIVE (*)    Opiate, Ur Screen POSITIVE (*)    Cannabinoid 50 Ng, Ur Alpha POSITIVE (*)    All other components within normal limits  LIPASE, BLOOD  ETHANOL   ____________________________________________  EKG  None - EKG not ordered by ED physician ____________________________________________  RADIOLOGY   ED MD interpretation: Stranding in his buttocks which is symmetrical and bilateral with no obvious fluid collection or abscess  Official radiology report(s): Ct Abdomen Pelvis W Contrast  Result Date: 03/23/2018 CLINICAL DATA:  32 year old male with abdominal pain, inguinal pain. Feeling of rectal pain and protrusion  after lifting weights. EXAM: CT ABDOMEN AND PELVIS WITH CONTRAST TECHNIQUE: Multidetector CT imaging of the abdomen and pelvis was performed using the standard protocol following bolus administration of intravenous contrast. CONTRAST:  ISOVUE-300 IOPAMIDOL (ISOVUE-300) INJECTION 61% COMPARISON:  CT Abdomen and Pelvis 02/04/2007. FINDINGS: Lower chest: Normal lung bases with mild respiratory motion. No pericardial or pleural effusion. Hepatobiliary: Negative liver and gallbladder. Pancreas: Negative. Spleen: Diminutive and normal. Adrenals/Urinary Tract: Normal adrenal glands. Bilateral renal enhancement and contrast excretion is symmetric and normal. No hydronephrosis or hydroureter. The urinary bladder is distended with an estimated bladder volume of 545 milliliters but otherwise unremarkable. Small left hemipelvis phlebolith incidentally noted. Stomach/Bowel: No rectal abnormality is identified. No perirectal inflammatory stranding. Mildly redundant but otherwise negative sigmoid colon. The sigmoid and left colon are largely decompressed. Mild gas and stool at the splenic  flexure. Oral contrast has reached the distal transverse colon. Normal right colon and appendix. Normal terminal ileum. No dilated small bowel. Negative stomach and duodenum. No abdominal free air or free fluid. Vascular/Lymphatic: Major arterial structures in the abdomen and pelvis are patent and normal. Portal venous system appears patent. No lymphadenopathy. Reproductive: Negative. No inguinal hernia. No inguinal lymphadenopathy. Other: No pelvic free fluid. There is mild-to-moderate symmetric appearing soft tissue inflammation along the medial gluteal folds (series 2, image 101), but otherwise no perianal or perineum abnormality. No soft tissue gas. Musculoskeletal: Negative. IMPRESSION: 1. Symmetric mild-to-moderate fat stranding in both medial gluteal folds. But otherwise no perineum, perianal, or rectal abnormality. No hernia identified. 2. Normal Abdomen CT. Electronically Signed   By: Odessa Fleming M.D.   On: 03/23/2018 23:02    ____________________________________________   PROCEDURES  Critical Care performed: No   Procedure(s) performed:   Procedures   ____________________________________________   INITIAL IMPRESSION / ASSESSMENT AND PLAN / ED COURSE  As part of my medical decision making, I reviewed the following data within the electronic MEDICAL RECORD NUMBER Nursing notes reviewed and incorporated, Labs reviewed  and Patient signed out to     Differential diagnosis includes, but is not limited to, intermittent rectal prolapse, inguinal hernia, other intra-abdominal infection, sexually transmitted disease, psychiatric illness, drug abuse.  The patient admits to drug abuse and I think that psychiatric illness is playing a component, although his report of feeling his "guts fall out" after weight lifting is very suggestive of rectal prolapse.  However he has a normal rectal exam at this time.  I have ordered lab work and a CT scan of his abdomen and pelvis with oral and IV contrast for the  best possible evaluation of the area in question.  There is no evidence that he has any testicular torsion, epididymitis, etc.  His symptoms seem confined to his buttocks or pelvis/lower abdomen.  Lab work is notable for cocaine, opioids, and cannabinoids, urinalysis unremarkable, metabolic panel notable for creatinine of 1.8, and CBC notable for leukocytosis.  Providing IV fluids, morphine, and Zofran, CT scan pending  Clinical Course as of Mar 24 120  Tue Mar 23, 2018  2315 CT is suggestive of possible cellulitis of the buttocks bilaterally and symmetrically.  It is also possible he has been aggravating the area by digging at himself with his fingers as he was doing upon arrival, but given his tachycardia, fever, leukocytosis (all of which could also be secondary to the cocaine he told to use), I will cover him with ceftriaxone 1 g IV and also administer 2 L of fluid for his elevated creatinine.  He will need an additional period of observation in the emergency department to make sure he is stable.  Discussed case with Dr. Manson Passey who will follow up and reassess to determine when he is ready to go home, but at this point I do not feel he would benefit from inpatient treatment.   [CF]    Clinical Course User Index [CF] Loleta Rose, MD    ____________________________________________  FINAL CLINICAL IMPRESSION(S) / ED DIAGNOSES  Final diagnoses:  Cellulitis, unspecified cellulitis site  Rectal pain  Polysubstance abuse (HCC)     MEDICATIONS GIVEN DURING THIS VISIT:  Medications  morphine 4 MG/ML injection 4 mg (4 mg Intravenous Given 03/23/18 2015)  ondansetron (ZOFRAN) injection 4 mg (4 mg Intravenous Given 03/23/18 2015)  iopamidol (ISOVUE-300) 61 % injection 30 mL (30 mLs Oral Contrast Given 03/23/18 2015)  iopamidol (ISOVUE-300) 61 % injection 100 mL (100 mLs Intravenous Contrast Given 03/23/18 2234)  sodium chloride 0.9 % bolus 2,000 mL (0 mLs Intravenous Stopped 03/24/18 0017)    cefTRIAXone (ROCEPHIN) 1 g in sodium chloride 0.9 % 100 mL IVPB (0 g Intravenous Stopped 03/24/18 0017)     ED Discharge Orders        Ordered    cephALEXin (KEFLEX) 500 MG capsule  2 times daily     03/24/18 0055    cephALEXin (KEFLEX) 500 MG capsule  4 times daily     03/23/18 2314       Note:  This document was prepared using Dragon voice recognition software and may include unintentional dictation errors.    Loleta Rose, MD 03/24/18 [phone]

## 2018-03-23 NOTE — ED Notes (Signed)
Patient transported to CT 

## 2018-03-23 NOTE — ED Notes (Signed)
Pt drinking contrast at this time, pt notified urine sample is needed, pt verbalized understanding of this.

## 2018-03-23 NOTE — ED Triage Notes (Signed)
Pt was brought in by ems for "something protruding from rectum". Also co inguinal pain and states it all started a few days ago while lifting weights while incarcerated.

## 2018-03-24 LAB — URINE DRUG SCREEN, QUALITATIVE (ARMC ONLY)
Amphetamines, Ur Screen: NOT DETECTED
BARBITURATES, UR SCREEN: NOT DETECTED
Benzodiazepine, Ur Scrn: NOT DETECTED
Cannabinoid 50 Ng, Ur ~~LOC~~: POSITIVE — AB
Cocaine Metabolite,Ur ~~LOC~~: POSITIVE — AB
MDMA (Ecstasy)Ur Screen: NOT DETECTED
METHADONE SCREEN, URINE: NOT DETECTED
OPIATE, UR SCREEN: POSITIVE — AB
Phencyclidine (PCP) Ur S: NOT DETECTED
Tricyclic, Ur Screen: NOT DETECTED

## 2018-03-24 MED ORDER — CEPHALEXIN 500 MG PO CAPS: 500.0000 mg | ORAL_CAPSULE | Freq: Two times a day (BID) | ORAL | 0 refills | Status: AC

## 2018-03-24 NOTE — ED Notes (Signed)
Detox offered to patient per Dr. Manson Passey pt refused at this time states "I am sober".

## 2018-03-24 NOTE — ED Provider Notes (Signed)
I assumed care of the patient from Dr. York Cerise at 11:30 PM.  Laboratory data revealed positive cocaine positive opiates and cannabinoids.  Patient now alert and oriented answering questions appropriately.  Patient states that he did use cocaine and marijuana earlier today.  Based on CT scan I reevaluated the patient's anus and perianal region which revealed no evidence of cellulitis superficial abrasions noted.  I offered detox to the patient to which he adamantly refused.  Patient contacted his mother who will transport him home.   Darci Current, MD 03/24/18 [phone]

## 2020-10-19 ENCOUNTER — Other Ambulatory Visit: Payer: Self-pay

## 2020-10-19 ENCOUNTER — Emergency Department
Admission: EM | Admit: 2020-10-19 | Discharge: 2020-10-19 | Disposition: A | Discharge status: Home / Self Care | Payer: No Typology Code available for payment source | Attending: Emergency Medicine | Admitting: Emergency Medicine

## 2020-10-19 ENCOUNTER — Encounter: Payer: Self-pay | Admitting: Emergency Medicine

## 2020-10-19 DIAGNOSIS — R441 Visual hallucinations: Secondary | ICD-10-CM | POA: Diagnosis present

## 2020-10-19 DIAGNOSIS — F1410 Cocaine abuse, uncomplicated: Secondary | ICD-10-CM | POA: Insufficient documentation

## 2020-10-19 DIAGNOSIS — F1210 Cannabis abuse, uncomplicated: Secondary | ICD-10-CM | POA: Diagnosis present

## 2020-10-19 DIAGNOSIS — F22 Delusional disorders: Secondary | ICD-10-CM | POA: Diagnosis not present

## 2020-10-19 DIAGNOSIS — Z20822 Contact with and (suspected) exposure to covid-19: Secondary | ICD-10-CM | POA: Insufficient documentation

## 2020-10-19 DIAGNOSIS — F1420 Cocaine dependence, uncomplicated: Secondary | ICD-10-CM | POA: Diagnosis present

## 2020-10-19 DIAGNOSIS — F15951 Other stimulant use, unspecified with stimulant-induced psychotic disorder with hallucinations: Secondary | ICD-10-CM

## 2020-10-19 DIAGNOSIS — F17210 Nicotine dependence, cigarettes, uncomplicated: Secondary | ICD-10-CM | POA: Insufficient documentation

## 2020-10-19 DIAGNOSIS — F4310 Post-traumatic stress disorder, unspecified: Secondary | ICD-10-CM | POA: Diagnosis present

## 2020-10-19 DIAGNOSIS — I10 Essential (primary) hypertension: Secondary | ICD-10-CM

## 2020-10-19 DIAGNOSIS — Z91010 Allergy to peanuts: Secondary | ICD-10-CM | POA: Diagnosis not present

## 2020-10-19 DIAGNOSIS — R45851 Suicidal ideations: Secondary | ICD-10-CM | POA: Insufficient documentation

## 2020-10-19 DIAGNOSIS — F1910 Other psychoactive substance abuse, uncomplicated: Secondary | ICD-10-CM

## 2020-10-19 DIAGNOSIS — F15251 Other stimulant dependence with stimulant-induced psychotic disorder with hallucinations: Secondary | ICD-10-CM | POA: Diagnosis not present

## 2020-10-19 DIAGNOSIS — S069X9A Unspecified intracranial injury with loss of consciousness of unspecified duration, initial encounter: Secondary | ICD-10-CM | POA: Diagnosis present

## 2020-10-19 DIAGNOSIS — F1510 Other stimulant abuse, uncomplicated: Secondary | ICD-10-CM

## 2020-10-19 DIAGNOSIS — S069XAA Unspecified intracranial injury with loss of consciousness status unknown, initial encounter: Secondary | ICD-10-CM | POA: Diagnosis present

## 2020-10-19 LAB — COMPREHENSIVE METABOLIC PANEL
ALT: 20 U/L (ref 0–44)
AST: 34 U/L (ref 15–41)
Albumin: 5 g/dL (ref 3.5–5.0)
Alkaline Phosphatase: 77 U/L (ref 38–126)
Anion gap: 13 (ref 5–15)
BUN: 17 mg/dL (ref 6–20)
CO2: 25 mmol/L (ref 22–32)
Calcium: 10.1 mg/dL (ref 8.9–10.3)
Chloride: 96 mmol/L — ABNORMAL LOW (ref 98–111)
Creatinine, Ser: 1.91 mg/dL — ABNORMAL HIGH (ref 0.61–1.24)
GFR, Estimated: 46 mL/min — ABNORMAL LOW (ref >60)
Glucose, Bld: 113 mg/dL — ABNORMAL HIGH (ref 70–99)
Potassium: 3.8 mmol/L (ref 3.5–5.1)
Sodium: 134 mmol/L — ABNORMAL LOW (ref 135–145)
Total Bilirubin: 1.3 mg/dL — ABNORMAL HIGH (ref 0.3–1.2)
Total Protein: 9.4 g/dL — ABNORMAL HIGH (ref 6.5–8.1)

## 2020-10-19 LAB — URINE DRUG SCREEN, QUALITATIVE (ARMC ONLY)
Amphetamines, Ur Screen: POSITIVE — AB
Barbiturates, Ur Screen: NOT DETECTED
Benzodiazepine, Ur Scrn: NOT DETECTED
Cannabinoid 50 Ng, Ur ~~LOC~~: POSITIVE — AB
Cocaine Metabolite,Ur ~~LOC~~: POSITIVE — AB
MDMA (Ecstasy)Ur Screen: NOT DETECTED
Methadone Scn, Ur: NOT DETECTED
Opiate, Ur Screen: NOT DETECTED
Phencyclidine (PCP) Ur S: NOT DETECTED
Tricyclic, Ur Screen: NOT DETECTED

## 2020-10-19 LAB — SALICYLATE LEVEL: Salicylate Lvl: <7 mg/dL — ABNORMAL LOW (ref 7.0–30.0)

## 2020-10-19 LAB — CBC
HCT: 46.4 % (ref 39.0–52.0)
Hemoglobin: 16 g/dL (ref 13.0–17.0)
MCH: 30 pg (ref 26.0–34.0)
MCHC: 34.5 g/dL (ref 30.0–36.0)
MCV: 86.9 fL (ref 80.0–100.0)
Platelets: 245 10*3/uL (ref 150–400)
RBC: 5.34 MIL/uL (ref 4.22–5.81)
RDW: 14.8 % (ref 11.5–15.5)
WBC: 14.5 10*3/uL — ABNORMAL HIGH (ref 4.0–10.5)
nRBC: 0 % (ref 0.0–0.2)

## 2020-10-19 LAB — RESPIRATORY PANEL BY RT PCR (FLU A&B, COVID)
Influenza A by PCR: NEGATIVE
Influenza B by PCR: NEGATIVE
SARS Coronavirus 2 by RT PCR: NEGATIVE

## 2020-10-19 LAB — ETHANOL: Alcohol, Ethyl (B): <10 mg/dL (ref <10)

## 2020-10-19 LAB — ACETAMINOPHEN LEVEL: Acetaminophen (Tylenol), Serum: <10 ug/mL — ABNORMAL LOW (ref 10–30)

## 2020-10-19 LAB — TROPONIN I (HIGH SENSITIVITY): Troponin I (High Sensitivity): 32 ng/L — ABNORMAL HIGH (ref <18)

## 2020-10-19 MED ORDER — LORAZEPAM 2 MG PO TABS: 0.0000 mg | ORAL_TABLET | Freq: Two times a day (BID) | ORAL | Status: DC

## 2020-10-19 MED ORDER — LORAZEPAM 2 MG/ML IJ SOLN: 0.0000 mg | Freq: Four times a day (QID) | INTRAMUSCULAR | Status: DC

## 2020-10-19 MED ORDER — LORAZEPAM 2 MG PO TABS: 2.0000 mg | ORAL_TABLET | Freq: Once | ORAL | Status: DC

## 2020-10-19 MED ORDER — LORAZEPAM 1 MG PO TABS
1.0000 mg | ORAL_TABLET | Freq: Once | ORAL | Status: DC
  Filled 2020-10-19: qty 1

## 2020-10-19 MED ORDER — QUETIAPINE FUMARATE 25 MG PO TABS: 100.0000 mg | ORAL_TABLET | Freq: Every day | ORAL | Status: DC

## 2020-10-19 MED ORDER — LORAZEPAM 2 MG/ML IJ SOLN: 0.0000 mg | Freq: Two times a day (BID) | INTRAMUSCULAR | Status: DC

## 2020-10-19 MED ORDER — THIAMINE HCL 100 MG/ML IJ SOLN: 100.0000 mg | Freq: Every day | INTRAMUSCULAR | Status: DC

## 2020-10-19 MED ORDER — LORAZEPAM 2 MG PO TABS
0.0000 mg | ORAL_TABLET | Freq: Four times a day (QID) | ORAL | Status: DC
  Administered 2020-10-19: 2 mg via ORAL
  Filled 2020-10-19: qty 1

## 2020-10-19 MED ORDER — THIAMINE HCL 100 MG PO TABS
100.0000 mg | ORAL_TABLET | Freq: Every day | ORAL | Status: DC
  Administered 2020-10-19: 100 mg via ORAL
  Filled 2020-10-19: qty 1

## 2020-10-19 NOTE — BH Assessment (Signed)
No beds currently available on [facility] due to staffing issues as 10/22 at 4pm, TTS will continue to follow-up with staffing/acuity of unit.  TTS attempted to make contact with [facility] [phone]) at 6:30pm and received no answer. TTS is currently unaware of bed availability at this time.  Referral information for Psychiatric Hospitalization faxed to:             [facility] ([phone]- [phone]),  Per Brittney, no available beds at this time.              [address] [phone]),              [facility] [phone] -or- [phone]),              [facility] [phone] -[phone]) [phone]             [facility] [phone] or [phone])             [facility] [phone] or [phone])             [facility] [phone] or [phone]),

## 2020-10-19 NOTE — ED Triage Notes (Signed)
1 pair sneakers, 1 pair pants, 1 brown belt,  1 pair underwear, 1 black t-shirt, 1 white t-shirt,  1 plastic bag with brown bag.  Pt dressed out by this RN and Shawna Orleans, EDT.    Pt currently under IVC, papers brought by [employer] after patient was triaged, IVC papers taken out by [facility] after patient left the facility.  Pt denies SI/HI at this time with this RN, [employer], and Melanie, EDT.

## 2020-10-19 NOTE — ED Provider Notes (Signed)
[facility] Emergency Department Provider Note ____________________________________________   First MD Initiated Contact with Patient 10/19/20 1138     (approximate)  I have reviewed the triage vital signs and the nursing notes.  HISTORY  Chief Complaint Hypertension and IVC   HPI Jeremy Frank is a 34 y.o. malewho presents to the ED for evaluation of his mental health and hypertension.  Chart review indicates history of PTSD, HTN and polysubstance abuse.  Patient reports he has no medications at home for his hypertension and takes no medications regularly, though he was previously prescribed HCTZ.  Patient presents to the ED because "I have been smoking meth for the past 3 days. And man that stuff is strong."  Patient ports associated auditory and visual hallucinations that he refuses to elaborate upon.  He denies command auditory hallucinations.  He reports vague suicidality and homicidality without a plan.  Denies acting upon any self-injurious behavior and has no plans formulated.  Past Medical History:  Diagnosis Date  . Drug dependence (HCC)   . GI bleed   . Hiatal hernia   . Hypertension   . PTSD (post-traumatic stress disorder)     Patient Active Problem List   Diagnosis Date Noted  . Psychosis (HCC) 06/13/2015  . Posttraumatic stress disorder 06/13/2015  . 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 Cocaine dependence (HCC) 06/13/2015  . Marijuana abuse 06/13/2015    Past Surgical History:  Procedure Laterality Date  . KNEE SURGERY      Prior to Admission medications   Medication Sig Start Date End Date Taking? Authorizing Provider  cephALEXin (KEFLEX) 500 MG capsule Take 1 capsule (500 mg total) by mouth 4 (four) times daily. 03/23/18   Loleta Rose, MD  mirtazapine (REMERON) 15 MG tablet Take 1 tablet (15 mg total) by mouth at bedtime. 06/15/15   Clapacs, Jackquline Denmark, MD    Allergies Cheese, Eggs or egg-derived products, and  Peanut-containing drug products  History reviewed. No pertinent family history.  Social History Social History   Tobacco Use  . Smoking status: Current Every Day Smoker    Types: Cigarettes  . Smokeless tobacco: Never Used  Substance Use Topics  . Alcohol use: Yes  . Drug use: Yes    Types: Cocaine, Marijuana    Review of Systems  Constitutional: No fever/chills Eyes: No visual changes. ENT: No sore throat. Cardiovascular: Denies chest pain. Respiratory: Denies shortness of breath. Gastrointestinal: No abdominal pain.  No nausea, no vomiting.  No diarrhea.  No constipation. Genitourinary: Negative for dysuria. Musculoskeletal: Negative for back pain. Skin: Negative for rash. Neurological: Negative for headaches, focal weakness or numbness. Psychiatric: Positive for suicidality, homicidality and hallucinations.  ____________________________________________   PHYSICAL EXAM:  VITAL SIGNS: Vitals:   10/19/20 1030  BP: (!) 160/97  Pulse: (!) 109  Resp: 20  Temp: 99.2 F (37.3 C)  SpO2: 97%      Constitutional: Alert and oriented.  Paranoid, frequently looking around and shifting his gaze. Eyes: Conjunctivae are normal. PERRL. EOMI. Head: Atraumatic. Nose: No congestion/rhinnorhea. Mouth/Throat: Mucous membranes are moist.  Oropharynx non-erythematous. Neck: No stridor. No cervical spine tenderness to palpation. Cardiovascular: Tachycardic rate, regular rhythm. Grossly normal heart sounds.  Good peripheral circulation. Respiratory: Normal respiratory effort.  No retractions. Lungs CTAB. Gastrointestinal: Soft , nondistended, nontender to palpation. No abdominal bruits. No CVA tenderness. Musculoskeletal: No lower extremity tenderness nor edema.  No joint effusions. No signs of acute trauma. Neurologic:  Normal speech and language. No  gross focal neurologic deficits are appreciated. No gait instability noted. Cranial nerves II through XII intact 5/5 strength and  sensation in all 4 extremities Skin:  Skin is warm, dry and intact. No rash noted. Psychiatric: Paranoid, but with linear thought processes.  ____________________________________________   LABS (all labs ordered are listed, but only abnormal results are displayed)  Labs Reviewed  COMPREHENSIVE METABOLIC PANEL - Abnormal; Notable for the following components:      Result Value   Sodium 134 (*)    Chloride 96 (*)    Glucose, Bld 113 (*)    Creatinine, Ser 1.91 (*)    Total Protein 9.4 (*)    Total Bilirubin 1.3 (*)    GFR, Estimated 46 (*)    All other components within normal limits  SALICYLATE LEVEL - Abnormal; Notable for the following components:   Salicylate Lvl <7.0 (*)    All other components within normal limits  ACETAMINOPHEN LEVEL - Abnormal; Notable for the following components:   Acetaminophen (Tylenol), Serum <10 (*)    All other components within normal limits  CBC - Abnormal; Notable for the following components:   WBC 14.5 (*)    All other components within normal limits  URINE DRUG SCREEN, QUALITATIVE ([facility] ONLY) - Abnormal; Notable for the following components:   Amphetamines, Ur Screen POSITIVE (*)    Cocaine Metabolite,Ur [place] POSITIVE (*)    Cannabinoid 50 Ng, Ur  POSITIVE (*)    All other components within normal limits  TROPONIN I (HIGH SENSITIVITY) - Abnormal; Notable for the following components:   Troponin I (High Sensitivity) 32 (*)    All other components within normal limits  ETHANOL   ____________________________________________  12 Lead EKG  Sinus rhythm, rate of 117 bpm.  Normal axis.  Normal intervals.  Sinus tachycardia.  No evidence of acute ischemia. ____________________________________________   PROCEDURES and INTERVENTIONS  Procedure(s) performed (including Critical Care):  Procedures  Medications  LORazepam (ATIVAN) tablet 1 mg (1 mg Oral Refused 10/19/20 1430)    ____________________________________________   MDM / ED  COURSE  34 year old with history of polysubstance abuse to include methamphetamines and cocaine presents with paranoia, suicidality and homicidality requiring IVC for psychiatric consultation.  Patient with mild tachycardia and hypertension, likely due to his recreational substance abuse of cocaine and methamphetamines.  Exam demonstrates a paranoid patient, but with linear thought processes who endorses vague suicidality without a plan.  Blood work demonstrates marginal elevation of his troponin, likely due to demand ischemia in the setting of his drug use.  He has no active chest pain here in the ED, EKG shows no ischemic features.  We will provide Ativan to help reduce his symptoms, IVC the patient for his own safety and hold the patient in the ED for psychiatric consultation and recommendations.     ____________________________________________   FINAL CLINICAL IMPRESSION(S) / ED DIAGNOSES  Final diagnoses:  Polysubstance abuse (HCC)  Cocaine abuse (HCC)  Suicidal ideation  Paranoia [facility])     ED Discharge Orders    None       Crystelle Ferrufino   Note:  This document was prepared using Conservation officer, historic buildings and may include unintentional dictation errors.   Delton Prairie, MD 10/19/20 1535

## 2020-10-19 NOTE — ED Notes (Signed)
Pt given belongings bag.  

## 2020-10-19 NOTE — ED Triage Notes (Signed)
Pt comes into the ED via EMS from [facility] with c/o elevated b/p , was there for rehab from meth. Pt is calm and cooperative, b/p 160/108, HR 114

## 2020-10-19 NOTE — Consult Note (Signed)
[facility] Face-to-Face Psychiatry Consult   Reason for Consult: Consult for this 34 year old man who was brought over from [facility] Referring Physician: Marcello Moores Patient Identification: Jeremy Frank MRN:  [id number] Principal Diagnosis: Amphetamine and psychostimulant-induced psychosis with hallucinations (HCC) Diagnosis:  Principal Problem:   Amphetamine and psychostimulant-induced psychosis with hallucinations (HCC) Active Problems:   Posttraumatic stress disorder   Traumatic brain injury (HCC)   Cocaine dependence (HCC)   Marijuana abuse   Amphetamine abuse (HCC)   Hypertension   Total Time spent with patient: 1 hour  Subjective:   Jeremy Frank is a 33 y.o. male patient admitted with "may be just rest".  HPI: Patient seen chart reviewed.  34 year old man sent here from [facility] initially with only the complaint that his blood pressure was high.  Patient later on in interview apparently mentions some passive suicidal or homicidal thoughts without any intent or plan.  On interview with [place] he says that he has been on a 3-day binge of methamphetamine and cocaine.  Has not slept or had anything much to eat in about 3 days.  Mood is feeling confused and depressed.  He is having auditory hallucinations.  He denies any suicidal or homicidal ideation.  He does endorse just feeling like he does not know what to do.  Patient reports that before this 3-day binge he had been in [place] at a veterans facility there but left that facility.  He does not seem to feel that he has a home or anybody to support him in the area.  Seems really to be kind of at loose ends as to what to do.  Not threatening or violent.  Drug screen positive for amphetamines cocaine and cannabis  Past Psychiatric History: Patient has been seen a few times previously in our emergency room with somewhat similar presentations.  We know from past interviews that he is a veteran and has a history of posttraumatic stress disorder and of a traumatic  brain injury.  He has had repeated problems with substance abuse.  He has reported in the past having at least one distant suicide attempt.  In the past we have had occasions when we were able to send him to the [facility] in [place].  Patient often will present with confusing psychotic-like symptoms especially when he first comes into the ER that then gradually resolved.  Risk to Self:   Risk to Others:   Prior Inpatient Therapy:   Prior Outpatient Therapy:    Past Medical History:  Past Medical History:  Diagnosis Date  . Drug dependence (HCC)   . GI bleed   . Hiatal hernia   . Hypertension   . PTSD (post-traumatic stress disorder)     Past Surgical History:  Procedure Laterality Date  . KNEE SURGERY     Family History: History reviewed. No pertinent family history. Family Psychiatric  History: Not reported Social History:  Social History   Substance and Sexual Activity  Alcohol Use Yes     Social History   Substance and Sexual Activity  Drug Use Yes  . Types: Cocaine, Marijuana    Social History   Socioeconomic History  . Marital status: Single    Spouse name: Not on file  . Number of children: Not on file  . Years of education: Not on file  . Highest education level: Not on file  Occupational History  . Not on file  Tobacco Use  . Smoking status: Current Every Day Smoker    Types:  Cigarettes  . Smokeless tobacco: Never Used  Substance and Sexual Activity  . Alcohol use: Yes  . Drug use: Yes    Types: Cocaine, Marijuana  . Sexual activity: Yes  Other Topics Concern  . Not on file  Social History Narrative  . Not on file   Social Determinants of Health   Financial Resource Strain:   . Difficulty of Paying Living Expenses: Not on file  Food Insecurity:   . Worried About Programme researcher, broadcasting/film/video in the Last Year: Not on file  . Ran Out of Food in the Last Year: Not on file  Transportation Needs:   . Lack of Transportation (Medical): Not on file  . Lack of  Transportation (Non-Medical): Not on file  Physical Activity:   . Days of Exercise per Week: Not on file  . Minutes of Exercise per Session: Not on file  Stress:   . Feeling of Stress : Not on file  Social Connections:   . Frequency of Communication with Friends and Family: Not on file  . Frequency of Social Gatherings with Friends and Family: Not on file  . Attends Religious Services: Not on file  . Active Member of Clubs or Organizations: Not on file  . Attends Banker Meetings: Not on file  . Marital Status: Not on file   Additional Social History:    Allergies:   Allergies  Allergen Reactions  . Cheese Diarrhea  . Eggs Or Egg-Derived Products Diarrhea  . Peanut-Containing Drug Products Diarrhea    Labs:  Results for orders placed or performed during the hospital encounter of 10/19/20 (from the past 48 hour(s))  Comprehensive metabolic panel     Status: Abnormal   Collection Time: 10/19/20 10:41 AM  Result Value Ref Range   Sodium 134 (L) 135 - 145 mmol/L   Potassium 3.8 3.5 - 5.1 mmol/L   Chloride 96 (L) 98 - 111 mmol/L   CO2 25 22 - 32 mmol/L   Glucose, Bld 113 (H) 70 - 99 mg/dL    Comment: Glucose reference range applies only to samples taken after fasting for at least 8 hours.   BUN 17 6 - 20 mg/dL   Creatinine, Ser 2.59 (H) 0.61 - 1.24 mg/dL   Calcium 56.3 8.9 - 87.5 mg/dL   Total Protein 9.4 (H) 6.5 - 8.1 g/dL   Albumin 5.0 3.5 - 5.0 g/dL   AST 34 15 - 41 U/L   ALT 20 0 - 44 U/L   Alkaline Phosphatase 77 38 - 126 U/L   Total Bilirubin 1.3 (H) 0.3 - 1.2 mg/dL   GFR, Estimated 46 (L) >60 mL/min    Comment: (NOTE) Calculated using the CKD-EPI Creatinine Equation (2021)    Anion gap 13 5 - 15    Comment: Performed at [facility], [address] [place], [place] [address]  Ethanol     Status: None   Collection Time: 10/19/20 10:41 AM  Result Value Ref Range   Alcohol, Ethyl (B) <10 <10 mg/dL    Comment: (NOTE) Lowest detectable  limit for serum alcohol is 10 mg/dL.  For medical purposes only. Performed at [facility], [address] [place], [place] [address]   Salicylate level     Status: Abnormal   Collection Time: 10/19/20 10:41 AM  Result Value Ref Range   Salicylate Lvl <7.0 (L) 7.0 - 30.0 mg/dL    Comment: Performed at [facility], [address], [place], [place] [address]  Acetaminophen level     Status: Abnormal   Collection Time: 10/19/20 10:41 AM  Result Value Ref Range   Acetaminophen (Tylenol), Serum <10 (L) 10 - 30 ug/mL    Comment: (NOTE) Therapeutic concentrations vary significantly. A range of 10-30 ug/mL  may be an effective concentration for many patients. However, some  are best treated at concentrations outside of this range. Acetaminophen concentrations >150 ug/mL at 4 hours after ingestion  and >50 ug/mL at 12 hours after ingestion are often associated with  toxic reactions.  Performed at [facility], [address] [place], [place] [address]   cbc     Status: Abnormal   Collection Time: 10/19/20 10:41 AM  Result Value Ref Range   WBC 14.5 (H) 4.0 - 10.5 K/uL   RBC 5.34 4.22 - 5.81 MIL/uL   Hemoglobin 16.0 13.0 - 17.0 g/dL   HCT 14.9 39 - 52 %   MCV 86.9 80.0 - 100.0 fL   MCH 30.0 26.0 - 34.0 pg   MCHC 34.5 30.0 - 36.0 g/dL   RDW 70.2 63.7 - 85.8 %   Platelets 245 150 - 400 K/uL   nRBC 0.0 0.0 - 0.2 %    Comment: Performed at [facility], [address] [place], [place] [address]  Troponin I (High Sensitivity)     Status: Abnormal   Collection Time: 10/19/20 10:41 AM  Result Value Ref Range   Troponin I (High Sensitivity) 32 (H) <18 ng/L    Comment: (NOTE) Elevated high sensitivity troponin I (hsTnI) values and significant  changes across serial measurements may suggest ACS but many other  chronic and acute conditions are known to elevate hsTnI results.  Refer to the "Links" section for chest pain algorithms and additional   guidance. Performed at [facility], [address] [place], [place] [address]   Urine Drug Screen, Qualitative     Status: Abnormal   Collection Time: 10/19/20 10:42 AM  Result Value Ref Range   Tricyclic, Ur Screen NONE DETECTED NONE DETECTED   Amphetamines, Ur Screen POSITIVE (A) NONE DETECTED   MDMA (Ecstasy)Ur Screen NONE DETECTED NONE DETECTED   Cocaine Metabolite,Ur [place] POSITIVE (A) NONE DETECTED   Opiate, Ur Screen NONE DETECTED NONE DETECTED   Phencyclidine (PCP) Ur S NONE DETECTED NONE DETECTED   Cannabinoid 50 Ng, Ur [place] POSITIVE (A) NONE DETECTED   Barbiturates, Ur Screen NONE DETECTED NONE DETECTED   Benzodiazepine, Ur Scrn NONE DETECTED NONE DETECTED   Methadone Scn, Ur NONE DETECTED NONE DETECTED    Comment: (NOTE) Tricyclics + metabolites, urine    Cutoff 1000 ng/mL Amphetamines + metabolites, urine  Cutoff 1000 ng/mL MDMA (Ecstasy), urine              Cutoff 500 ng/mL Cocaine Metabolite, urine          Cutoff 300 ng/mL Opiate + metabolites, urine        Cutoff 300 ng/mL Phencyclidine (PCP), urine         Cutoff 25 ng/mL Cannabinoid, urine                 Cutoff 50 ng/mL Barbiturates + metabolites, urine  Cutoff 200 ng/mL Benzodiazepine, urine              Cutoff 200 ng/mL Methadone, urine                   Cutoff 300 ng/mL  The urine drug screen provides only a preliminary, unconfirmed  analytical test result and should not be used for non-medical purposes. Clinical consideration and professional judgment should be applied to any positive drug screen result due to possible interfering substances. A more specific alternate chemical method must be used in order to obtain a confirmed analytical result. Gas chromatography / mass spectrometry (GC/MS) is the preferred confirm atory method. Performed at [facility], [address], [place], [place] [address]     Current Facility-Administered Medications  Medication Dose Route Frequency  Provider Last Rate Last Admin  . LORazepam (ATIVAN) injection 0-4 mg  0-4 mg Intravenous Q6H Shaune Pollack, MD       Or  . LORazepam (ATIVAN) tablet 0-4 mg  0-4 mg Oral Q6H Shaune Pollack, MD      . Melene Muller ON 10/22/2020] LORazepam (ATIVAN) injection 0-4 mg  0-4 mg Intravenous Q12H Shaune Pollack, MD       Or  . Melene Muller ON 10/22/2020] LORazepam (ATIVAN) tablet 0-4 mg  0-4 mg Oral Q12H Shaune Pollack, MD      . LORazepam (ATIVAN) tablet 1 mg  1 mg Oral Once Delton Prairie, MD      . LORazepam (ATIVAN) tablet 2 mg  2 mg Oral Once Teven Mittman T, MD      . QUEtiapine (SEROQUEL) tablet 100 mg  100 mg Oral QHS Jacorey Donaway T, MD      . thiamine tablet 100 mg  100 mg Oral Daily Shaune Pollack, MD       Or  . thiamine (B-1) injection 100 mg  100 mg Intravenous Daily Shaune Pollack, MD       Current Outpatient Medications  Medication Sig Dispense Refill  . cephALEXin (KEFLEX) 500 MG capsule Take 1 capsule (500 mg total) by mouth 4 (four) times daily. 40 capsule 0  . mirtazapine (REMERON) 15 MG tablet Take 1 tablet (15 mg total) by mouth at bedtime. 30 tablet 0    Musculoskeletal: Strength & Muscle Tone: within normal limits Gait & Station: normal Patient leans: N/A  Psychiatric Specialty Exam: Physical Exam Constitutional:      Appearance: He is well-developed.    HENT:     Head: Normocephalic and atraumatic.  Eyes:     Conjunctiva/sclera: Conjunctivae normal.     Pupils: Pupils are equal, round, and reactive to light.  Cardiovascular:     Heart sounds: Normal heart sounds.  Pulmonary:     Effort: Pulmonary effort is normal.  Abdominal:     Palpations: Abdomen is soft.  Musculoskeletal:        General: Normal range of motion.     Cervical back: Normal range of motion.  Skin:    General: Skin is warm and dry.  Neurological:     Mental Status: He is alert.  Psychiatric:        Attention and Perception: He is inattentive.        Mood and Affect: Mood is anxious. Affect is  blunt.        Speech: Speech is delayed.        Behavior: Behavior is slowed. Behavior is not agitated or aggressive.        Thought Content: Thought content is paranoid. Thought content does not include homicidal or suicidal ideation.        Cognition and Memory: Cognition is impaired.        Judgment: Judgment normal.     Review of Systems  Constitutional: Negative.   HENT: Negative.   Eyes: Negative.   Respiratory:  Negative.   Cardiovascular: Negative.   Gastrointestinal: Negative.   Musculoskeletal: Negative.   Skin: Negative.   Neurological: Negative.   Psychiatric/Behavioral: Positive for behavioral problems, confusion, decreased concentration, dysphoric mood, hallucinations and sleep disturbance. The patient is nervous/anxious.     Blood pressure (!) 160/97, pulse (!) 109, temperature 99.2 F (37.3 C), temperature source Oral, resp. rate 20, height 5\' 11"  (1.803 m), weight 98.9 kg, SpO2 97 %.Body mass index is 30.4 kg/m.  General Appearance: Casual  Eye Contact:  Good  Speech:  Slow  Volume:  Decreased  Mood:  Anxious  Affect:  Blunt  Thought Process:  Disorganized  Orientation:  Negative  Thought Content:  Paranoid Ideation and Rumination  Suicidal Thoughts:  No  Homicidal Thoughts:  No  Memory:  Immediate;   Fair Recent;   Poor Remote;   Fair  Judgement:  Impaired  Insight:  Shallow  Psychomotor Activity:  Decreased  Concentration:  Concentration: Poor  Recall:  of Knowledge:  Fair  Language:  Fair  Akathisia:  No  Handed:  Right  AIMS (if indicated):     Assets:  Desire for Improvement Resilience  ADL's:  Impaired  Cognition:  Impaired,  Mild  Sleep:        Treatment Plan Summary: Daily contact with patient to assess and evaluate symptoms and progress in treatment, Medication management and Plan Patient with a history of substance abuse and other mental health issues comes to the emergency room.  Recently intoxicated.  Still kind of spacey  in his presentation.  Not able to really formulate a plan for what he is going to do.  He endorses having auditory hallucinations.  Admits he has some very passive suicidal thoughts but no intention or plan to act on them.  He does not necessarily meet commitment criteria but also does not have any specific place to go and does not seem to be in a frame of mind to care for himself.  Patient has been put on detox orders and will be offered some Ativan just to calm down for now and I have also suggested that we start him on some Seroquel to help him sleep at night and help with mood stability.  Positions in the ER are going to address his high blood pressure.  We will check with the [facility] and see if we might be able to have him admitted there.  If he could be admitted to the [facility] it would be ideal.  We probably do not have any beds on our unit but I will tentatively say that he could be admitted here if we did have beds available.  Otherwise we can refer to other facilities.  Ongoing reassessment.  Disposition: Recommend psychiatric Inpatient admission when medically cleared.  [place], MD 10/19/2020 4:07 PM

## 2020-10-19 NOTE — BH Assessment (Signed)
Patient has been accepted to [facility].  Accepting physician is Dr. Estill Cotta.  Call report to [phone].  Representative was Aqua. Aqua requested pt's covid results be faxed. Task completed:   ER Staff is aware of it:  Delaney Meigs, ER Secretary  Dr. Katrinka Blazing, ER MD  Amy Patient's Nurse     Pt can be transported after 8am on 10/20/20.

## 2020-10-19 NOTE — ED Provider Notes (Signed)
IVC rescinded by Dr. Toni Amend. 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 Pt was awaiting voluntary placement but now would like to go home. He denies SI, HI, AVH. Will d/c with outpt follow-up, return precautions, and resources.   Shaune Pollack, MD 10/19/20 [phone]

## 2020-10-19 NOTE — BH Assessment (Signed)
Assessment Note  Jeremy Frank is an 34 y.o. male who presents to [facility] ED involuntarily for treatment. Per triage note, Pt comes into the ED via EMS from [facility] with c/o elevated b/p was there for rehab from meth. Pt is calm and cooperative, b/p 160/108, HR 114. Pt denies symptoms at this time. Pt denies CP, SOB. Pt states has not been taking his HTN medications at this time.  During TTS assessment pt presents alert and oriented x 3, slow speech, restless but cooperative, and mood-congruent with affect. The pt does not appear to be responding to internal or external stimuli. Neither is the pt presenting with any delusional thinking. Pt verified the information provided to triage RN. Pt identified his main compliant to be AH and the need for medications. Pt admits to using Methamphetamine and alcohol for the last 3 days and when asked the amount pt states "as much as I can get my hands on". Pt reports an increase in his current symptom the last 3 days. Pt denies a hx of endorsing AH and reports use of other substances (Heroin, marijuana, and fentanyl) but identified his current substance of choice to be Methamphetamine and alcohol. Pt states "I tried the hard stuff to but it doesn't last long and some drugs are harder to get than others". Pt reports his last drink to be today and struggles eating and sleeping for 3 days. Pt reports [facility]PT for [facility] years ago for 9 months and denies any current OPT. Pt reports to be homeless and residing the last three days at the [facility] located in [place], [place]. Pt states "I can not go back there due to a disagreement" but was unclear in elaborating. Pt reports to be unsure of his family hx as he "has no one". Pt denies any current SI/HI/VH but continues to endorse AH and is unable to provide a collateral contact.   Per Dr. Toni Amend pt meets criteria for INPT   Diagnosis: Amphetamine and psychostimulant-induced psychosis with  hallucinations   Past Medical History:  Past Medical History:  Diagnosis Date  . Drug dependence (HCC)   . GI bleed   . Hiatal hernia   . Hypertension   . PTSD (post-traumatic stress disorder)     Past Surgical History:  Procedure Laterality Date  . KNEE SURGERY      Family History: History reviewed. No pertinent family history.  Social History:  reports that he has been smoking cigarettes. He has never used smokeless tobacco. He reports current alcohol use. He reports current drug use. Drugs: Cocaine and Marijuana.  Additional Social History:  Alcohol / Drug Use Pain Medications: see mar Prescriptions: see mar Over the Counter: see mar History of alcohol / drug use?: Yes Substance #1 Name of Substance 1: Methamphetamine Substance #2 Name of Substance 2: Alcohol  CIWA: CIWA-Ar BP: (!) 160/97 Pulse Rate: (!) 109 COWS:    Allergies:  Allergies  Allergen Reactions  . Cheese Diarrhea  . Eggs Or Egg-Derived Products Diarrhea  . Peanut-Containing Drug Products Diarrhea    Home Medications: (Not in a hospital admission)   OB/GYN Status:  No LMP for male patient.  General Assessment Data Location of Assessment: [facility] ED TTS Assessment: In system Is this a Tele or Face-to-Face Assessment?: Face-to-Face Is this an Initial Assessment or a Re-assessment for this encounter?: Initial Assessment Patient Accompanied by:: N/A Language Other than English: No Living Arrangements: Homeless/Shelter What gender do you identify as?:  Male Date Telepsych consult ordered in [facility]: 10/19/20 Time Telepsych consult ordered in [facility]: 0935 Marital status: Single Maiden name: n/a Pregnancy Status: No Living Arrangements: Other (Comment) Can pt return to current living arrangement?: Yes Admission Status: Involuntary Petitioner: Other Is patient capable of signing voluntary admission?: No Referral Source: Self/Family/Friend Insurance type: Pensions consultant Care  Plan Living Arrangements: Other (Comment) Legal Guardian:  (self) Name of Psychiatrist: None reported  Name of Therapist: None reported   Education Status Is patient currently in school?: No Is the patient employed, unemployed or receiving disability?: Unemployed  Risk to self with the past 6 months Suicidal Ideation: No Has patient been a risk to self within the past 6 months prior to admission? : No Suicidal Intent: No Has patient had any suicidal intent within the past 6 months prior to admission? : No Is patient at risk for suicide?: No Suicidal Plan?: No Has patient had any suicidal plan within the past 6 months prior to admission? : No Access to Means: No What has been your use of drugs/alcohol within the last 12 months?: Meth & Alcohol  Previous Attempts/Gestures: No How many times?: 0 Other Self Harm Risks: None reported  Triggers for Past Attempts: None known Intentional Self Injurious Behavior: None Family Suicide History: Unknown Recent stressful life event(s): Other (Comment) Persecutory voices/beliefs?: No Depression: Yes Depression Symptoms: Insomnia Substance abuse history and/or treatment for substance abuse?: Yes Suicide prevention information given to non-admitted patients: Not applicable  Risk to Others within the past 6 months Homicidal Ideation: No Does patient have any lifetime risk of violence toward others beyond the six months prior to admission? : No Thoughts of Harm to Others: No Current Homicidal Intent: No Current Homicidal Plan: No Access to Homicidal Means: No Identified Victim: n/a History of harm to others?: No Assessment of Violence: None Noted Violent Behavior Description: n/a Does patient have access to weapons?: No Criminal Charges Pending?: No Describe Pending Criminal Charges: n/a Does patient have a court date: No Is patient on probation?: No  Psychosis Hallucinations: Auditory Delusions: None noted  Mental Status  Report Appearance/Hygiene: In scrubs Eye Contact: Good Motor Activity: Freedom of movement Speech: Logical/coherent, Soft Level of Consciousness: Alert Mood: Depressed, Pleasant Affect: Depressed, Flat Anxiety Level: None Thought Processes: Coherent, Relevant Judgement: Partial Orientation: Appropriate for developmental age Obsessive Compulsive Thoughts/Behaviors: None  Cognitive Functioning Concentration: Normal Memory: Recent Intact, Remote Intact Is patient IDD: No Insight: Poor Impulse Control: Good Appetite: Poor Have you had any weight changes? : No Change Sleep: Decreased Total Hours of Sleep:  (Pt reports insomnia for 3 days ) Vegetative Symptoms: None  ADLScreening [facility] Assessment Services) Patient's cognitive ability adequate to safely complete daily activities?: Yes Patient able to express need for assistance with ADLs?: Yes Independently performs ADLs?: Yes (appropriate for developmental age)  Prior Inpatient Therapy Prior Inpatient Therapy: Yes Prior Therapy Dates: 3 years ago Prior Therapy Facilty/Provider(s): [facility] Reason for Treatment: Altered Mental Status  Prior Outpatient Therapy Prior Outpatient Therapy: No Does patient have an ACCT team?: No Does patient have Intensive In-House Services?  : No Does patient have [facility] services? : No Does patient have P4CC services?: Unknown  ADL Screening (condition at time of admission) Patient's cognitive ability adequate to safely complete daily activities?: Yes Is the patient deaf or have difficulty hearing?: No Does the patient have difficulty seeing, even when wearing glasses/contacts?: No Does the patient have difficulty concentrating, remembering, or making decisions?: No Patient able  to express need for assistance with ADLs?: Yes Does the patient have difficulty dressing or bathing?: No Independently performs ADLs?: Yes (appropriate for developmental age) Does the patient have difficulty walking or  climbing stairs?: No Weakness of Legs: None Weakness of Arms/Hands: None  Home Assistive Devices/Equipment Home Assistive Devices/Equipment: None  Therapy Consults (therapy consults require a physician order) PT Evaluation Needed: No OT Evalulation Needed: No SLP Evaluation Needed: No Abuse/Neglect Assessment (Assessment to be complete while patient is alone) Abuse/Neglect Assessment Can Be Completed: Yes Physical Abuse: Denies Verbal Abuse: Denies Sexual Abuse: Denies Exploitation of patient/patient's resources: Denies Self-Neglect: Denies Values / Beliefs Cultural Requests During Hospitalization: None Consults Spiritual Care Consult Needed: No Transition of Care Team Consult Needed: No Advance Directives (For Healthcare) Does Patient Have a Medical Advance Directive?: No Would patient like information on creating a medical advance directive?: No - Patient declined          Disposition:  Disposition Initial Assessment Completed for this Encounter: Yes Patient referred to: Other (Comment)  On Site Evaluation by:   Reviewed with Physician:    Opal Sidles 10/19/2020 4:20 PM

## 2020-10-19 NOTE — ED Notes (Signed)
VOL, pend placement 

## 2020-10-19 NOTE — BH Assessment (Signed)
No beds currently available on [facility] due to staffing issues as 10/22 at 4pm, TTS will continue to follow-up with staffing/acuity of unit.  TTS attempted to make contact with [facility] ([phone]) at 6:30pm and received no answer. TTS is currently unaware of bed availability at this time.   Referral information for Psychiatric Hospitalization faxed to:   [facility] ([phone]- [phone]),    [address] [phone]),    [facility] [phone] -or- [phone]),    [facility] (-[phone] -[phone]) [phone]   [facility] [phone] or [phone])   [facility] [phone] or [phone])   [facility] [phone] or [phone]),

## 2020-10-19 NOTE — ED Triage Notes (Signed)
See first RN note, pt denies symptoms at this time. Pt denies CP, SOB. Pt calm and cooperative. Pt states has not been taking his HTN medications at this time.

## 2021-03-13 ENCOUNTER — Emergency Department
Admission: EM | Admit: 2021-03-13 | Discharge: 2021-03-15 | Disposition: A | Discharge status: Home / Self Care | Payer: No Typology Code available for payment source | Attending: Emergency Medicine | Admitting: Emergency Medicine

## 2021-03-13 ENCOUNTER — Encounter: Payer: Self-pay | Admitting: Emergency Medicine

## 2021-03-13 ENCOUNTER — Other Ambulatory Visit: Payer: Self-pay

## 2021-03-13 DIAGNOSIS — Z20822 Contact with and (suspected) exposure to covid-19: Secondary | ICD-10-CM | POA: Diagnosis not present

## 2021-03-13 DIAGNOSIS — R441 Visual hallucinations: Secondary | ICD-10-CM | POA: Diagnosis not present

## 2021-03-13 DIAGNOSIS — F1910 Other psychoactive substance abuse, uncomplicated: Secondary | ICD-10-CM | POA: Diagnosis not present

## 2021-03-13 DIAGNOSIS — R45851 Suicidal ideations: Secondary | ICD-10-CM | POA: Diagnosis not present

## 2021-03-13 DIAGNOSIS — F15951 Other stimulant use, unspecified with stimulant-induced psychotic disorder with hallucinations: Secondary | ICD-10-CM | POA: Diagnosis present

## 2021-03-13 DIAGNOSIS — Z91010 Allergy to peanuts: Secondary | ICD-10-CM | POA: Insufficient documentation

## 2021-03-13 DIAGNOSIS — F4310 Post-traumatic stress disorder, unspecified: Secondary | ICD-10-CM | POA: Diagnosis present

## 2021-03-13 DIAGNOSIS — R440 Auditory hallucinations: Secondary | ICD-10-CM | POA: Diagnosis not present

## 2021-03-13 DIAGNOSIS — Z046 Encounter for general psychiatric examination, requested by authority: Secondary | ICD-10-CM | POA: Diagnosis present

## 2021-03-13 DIAGNOSIS — F29 Unspecified psychosis not due to a substance or known physiological condition: Secondary | ICD-10-CM | POA: Diagnosis present

## 2021-03-13 DIAGNOSIS — F17210 Nicotine dependence, cigarettes, uncomplicated: Secondary | ICD-10-CM | POA: Diagnosis not present

## 2021-03-13 DIAGNOSIS — F1420 Cocaine dependence, uncomplicated: Secondary | ICD-10-CM | POA: Diagnosis present

## 2021-03-13 DIAGNOSIS — I10 Essential (primary) hypertension: Secondary | ICD-10-CM | POA: Insufficient documentation

## 2021-03-13 DIAGNOSIS — F1210 Cannabis abuse, uncomplicated: Secondary | ICD-10-CM | POA: Diagnosis present

## 2021-03-13 DIAGNOSIS — F1510 Other stimulant abuse, uncomplicated: Secondary | ICD-10-CM | POA: Diagnosis present

## 2021-03-13 LAB — SALICYLATE LEVEL: Salicylate Lvl: <7 mg/dL — ABNORMAL LOW (ref 7.0–30.0)

## 2021-03-13 LAB — COMPREHENSIVE METABOLIC PANEL
ALT: 18 U/L (ref 0–44)
AST: 30 U/L (ref 15–41)
Albumin: 4.3 g/dL (ref 3.5–5.0)
Alkaline Phosphatase: 62 U/L (ref 38–126)
Anion gap: 8 (ref 5–15)
BUN: 20 mg/dL (ref 6–20)
CO2: 22 mmol/L (ref 22–32)
Calcium: 8.8 mg/dL — ABNORMAL LOW (ref 8.9–10.3)
Chloride: 108 mmol/L (ref 98–111)
Creatinine, Ser: 1.15 mg/dL (ref 0.61–1.24)
GFR, Estimated: >60 mL/min (ref >60)
Glucose, Bld: 84 mg/dL (ref 70–99)
Potassium: 3.4 mmol/L — ABNORMAL LOW (ref 3.5–5.1)
Sodium: 138 mmol/L (ref 135–145)
Total Bilirubin: 0.5 mg/dL (ref 0.3–1.2)
Total Protein: 7.7 g/dL (ref 6.5–8.1)

## 2021-03-13 LAB — CBC
HCT: 37.9 % — ABNORMAL LOW (ref 39.0–52.0)
Hemoglobin: 13 g/dL (ref 13.0–17.0)
MCH: 29.8 pg (ref 26.0–34.0)
MCHC: 34.3 g/dL (ref 30.0–36.0)
MCV: 86.9 fL (ref 80.0–100.0)
Platelets: 175 10*3/uL (ref 150–400)
RBC: 4.36 MIL/uL (ref 4.22–5.81)
RDW: 13.1 % (ref 11.5–15.5)
WBC: 10.1 10*3/uL (ref 4.0–10.5)
nRBC: 0 % (ref 0.0–0.2)

## 2021-03-13 LAB — ETHANOL: Alcohol, Ethyl (B): 49 mg/dL — ABNORMAL HIGH (ref <10)

## 2021-03-13 LAB — ACETAMINOPHEN LEVEL: Acetaminophen (Tylenol), Serum: <10 ug/mL — ABNORMAL LOW (ref 10–30)

## 2021-03-13 NOTE — ED Triage Notes (Signed)
Patient states that he has been off of his medication for about a week. Patient states that he is starting to hallucinate. Patient states that he drinks alcohol and uses drugs to self medicate. Patient states that when he self medicates it makes him have thoughts of wanting to hurt himself.

## 2021-03-13 NOTE — ED Notes (Signed)
Patient changed out by this RN and Beth EDT. Patient with a pair of shoes, burgundy sweat shirt, jean, black t-shirt, hat, book bag and book bag.

## 2021-03-13 NOTE — ED Provider Notes (Signed)
[facility] Emergency Department Provider Note  ____________________________________________   Event Date/Time   First MD Initiated Contact with Patient 03/13/21 2300     (approximate)  I have reviewed the triage vital signs and the nursing notes.   HISTORY  Chief Complaint Psychiatric Evaluation    HPI Jeremy Frank is a 35 y.o. male with history of polysubstance abuse, psychosis who presents to the emergency department with complaints of feeling suicidal after not being on his medication for the past week.  He states that he has been out of Depakote for a week and he is hearing and seeing things and it makes him want to harm himself.  States he is hearing what sounds like a "radio".  He denies any thoughts wanting to harm anyone else.  He states "I want to do anything a rational like being my head into the wall".  He reports using cocaine and marijuana as well as alcohol today.  States he is "self-medicating".  Patient is homeless.  He was released from incarceration in December.  He is a Cytogeneticist.        Past Medical History:  Diagnosis Date  . Drug dependence (HCC)   . GI bleed   . Hiatal hernia   . Hypertension   . PTSD (post-traumatic stress disorder)     Patient Active Problem List   Diagnosis Date Noted  . 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 Hypertension 10/19/2020  . Psychosis (HCC) 06/13/2015  . Posttraumatic stress disorder 06/13/2015  . 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 Cocaine dependence (HCC) 06/13/2015  . Marijuana abuse 06/13/2015    Past Surgical History:  Procedure Laterality Date  . KNEE SURGERY      Prior to Admission medications   Medication Sig Start Date End Date Taking? Authorizing Provider  gabapentin (NEURONTIN) 300 MG capsule Take 300 mg by mouth 3 (three) times daily. Patient not taking: Reported on  03/14/2021    [provider]  omeprazole (PRILOSEC) 40 MG capsule Take 40 mg by mouth daily. Patient not taking: Reported on 03/14/2021 04/18/20   [provider]    Allergies Cheese, Eggs or egg-derived products, and Peanut-containing drug products  No family history on file.  Social History Social History   Tobacco Use  . Smoking status: Current Every Day Smoker    Types: Cigarettes  . Smokeless tobacco: Never Used  Substance Use Topics  . Alcohol use: Yes  . Drug use: Yes    Types: Cocaine, Marijuana    Review of Systems Constitutional: No fever. Eyes: No visual changes. ENT: No sore throat. Cardiovascular: Denies chest pain. Respiratory: Denies shortness of breath. Gastrointestinal: No nausea, vomiting, diarrhea. Genitourinary: Negative for dysuria. Musculoskeletal: Negative for back pain. Skin: Negative for rash. Neurological: Negative for focal weakness or numbness.  ____________________________________________   PHYSICAL EXAM:  VITAL SIGNS: ED Triage Vitals [03/13/21 2242]  Enc Vitals Group     BP (!) 161/111     Pulse Rate 93     Resp 18     Temp 98.5 F (36.9 C)     Temp Source Oral     SpO2 98 %     Weight 230 lb (104.3 kg)     Height 5\' 11"  (1.803 m)     Head Circumference      Peak Flow      Pain Score 5     Pain Loc  Pain Edu?      Excl. in GC?    CONSTITUTIONAL: Alert and oriented and responds appropriately to questions. Well-appearing; well-nourished HEAD: Normocephalic EYES: Conjunctivae clear, pupils appear equal, EOM appear intact ENT: normal nose; moist mucous membranes NECK: Supple, normal ROM CARD: RRR; S1 and S2 appreciated; no murmurs, no clicks, no rubs, no gallops RESP: Normal chest excursion without splinting or tachypnea; breath sounds clear and equal bilaterally; no wheezes, no rhonchi, no rales, no hypoxia or respiratory distress, speaking full sentences ABD/GI: Normal bowel sounds; non-distended; soft,  non-tender, no rebound, no guarding, no peritoneal signs, no hepatosplenomegaly BACK: The back appears normal EXT: Normal ROM in all joints; no deformity noted, no edema; no cyanosis SKIN: Normal color for age and race; warm; no rash on exposed skin NEURO: Moves all extremities equally PSYCH: Flat affect.  Endorses HI, auditory and visual hallucinations.  No HI.  ____________________________________________   LABS (all labs ordered are listed, but only abnormal results are displayed)  Labs Reviewed  COMPREHENSIVE METABOLIC PANEL - Abnormal; Notable for the following components:      Result Value   Potassium 3.4 (*)    Calcium 8.8 (*)    All other components within normal limits  ETHANOL - Abnormal; Notable for the following components:   Alcohol, Ethyl (B) 49 (*)    All other components within normal limits  SALICYLATE LEVEL - Abnormal; Notable for the following components:   Salicylate Lvl <7.0 (*)    All other components within normal limits  ACETAMINOPHEN LEVEL - Abnormal; Notable for the following components:   Acetaminophen (Tylenol), Serum <10 (*)    All other components within normal limits  CBC - Abnormal; Notable for the following components:   HCT 37.9 (*)    All other components within normal limits  URINE DRUG SCREEN, QUALITATIVE ([facility] ONLY) - Abnormal; Notable for the following components:   Cocaine Metabolite,Ur Anderson POSITIVE (*)    Cannabinoid 50 Ng, Ur Moriches POSITIVE (*)    All other components within normal limits  RESP PANEL BY RT-PCR (FLU A&B, COVID) ARPGX2  VALPROIC ACID LEVEL   ____________________________________________  EKG  None ____________________________________________  RADIOLOGY I, Alauna Hayden, personally viewed and evaluated these images (plain radiographs) as part of my medical decision making, as well as reviewing the written report by the radiologist.  ED MD interpretation: None  Official radiology report(s): No results  found.  ____________________________________________   PROCEDURES  Procedure(s) performed (including Critical Care):  Procedures   ____________________________________________   INITIAL IMPRESSION / ASSESSMENT AND PLAN / ED COURSE  As part of my medical decision making, I reviewed the following data within the electronic MEDICAL RECORD NUMBER Nursing notes reviewed and incorporated, Labs reviewed , Old chart reviewed and Notes from prior ED visits         Patient here with complaints of SI, hallucinations.  States "I do not want to do anything irrational like being my head on the wall".  Screening labs show alcohol level of 49.  Drug screen positive for cocaine and cannabinoids.  Patient is under full IVC at this time.  TTS and psychiatry consulted.  ED PROGRESS  Patient evaluated by Gillermo Murdoch, NP with psychiatry.  He meets criteria for inpatient treatment and I agree.  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  Social work/case management has been consulted.  If they are unable to  get him to the [place], psychiatry  here will seek inpatient treatment.   I reviewed all nursing notes and pertinent previous records as available.  I have reviewed and interpreted any EKGs, lab and urine results, imaging (as available).  ____________________________________________   FINAL CLINICAL IMPRESSION(S) / ED DIAGNOSES  Final diagnoses:  Suicidal ideation  Polysubstance abuse [facility])     ED Discharge Orders    None      *Please note:  Jeremy Frank was evaluated in Emergency Department on 03/14/2021 for the symptoms described in the history of present illness. He was evaluated in the context of the global COVID-19 pandemic, which necessitated consideration that the patient might be at risk for infection with the SARS-CoV-2 virus that causes COVID-19. Institutional protocols and algorithms that pertain to the evaluation of patients at risk  for COVID-19 are in a state of rapid change based on information released by regulatory bodies including the CDC and federal and state organizations. These policies and algorithms were followed during the patient's care in the ED.  Some ED evaluations and interventions may be delayed as a result of limited staffing during and the pandemic.*   Note:  This document was prepared using Dragon voice recognition software and may include unintentional dictation errors.   Auguste Tebbetts, Layla Maw, DO 03/14/21 [phone]

## 2021-03-14 DIAGNOSIS — F15951 Other stimulant use, unspecified with stimulant-induced psychotic disorder with hallucinations: Secondary | ICD-10-CM

## 2021-03-14 LAB — RESP PANEL BY RT-PCR (FLU A&B, COVID) ARPGX2
Influenza A by PCR: NEGATIVE
Influenza B by PCR: NEGATIVE
SARS Coronavirus 2 by RT PCR: NEGATIVE

## 2021-03-14 LAB — URINE DRUG SCREEN, QUALITATIVE (ARMC ONLY)
Amphetamines, Ur Screen: NOT DETECTED
Barbiturates, Ur Screen: NOT DETECTED
Benzodiazepine, Ur Scrn: NOT DETECTED
Cannabinoid 50 Ng, Ur ~~LOC~~: POSITIVE — AB
Cocaine Metabolite,Ur ~~LOC~~: POSITIVE — AB
MDMA (Ecstasy)Ur Screen: NOT DETECTED
Methadone Scn, Ur: NOT DETECTED
Opiate, Ur Screen: NOT DETECTED
Phencyclidine (PCP) Ur S: NOT DETECTED
Tricyclic, Ur Screen: NOT DETECTED

## 2021-03-14 LAB — VALPROIC ACID LEVEL: Valproic Acid Lvl: <10 ug/mL — ABNORMAL LOW (ref 50.0–100.0)

## 2021-03-14 MED ORDER — IBUPROFEN 800 MG PO TABS
800.0000 mg | ORAL_TABLET | Freq: Three times a day (TID) | ORAL | Status: DC | PRN
  Administered 2021-03-14: 800 mg via ORAL
  Filled 2021-03-14: qty 1

## 2021-03-14 MED ORDER — DIVALPROEX SODIUM 500 MG PO DR TAB
500.0000 mg | DELAYED_RELEASE_TABLET | Freq: Two times a day (BID) | ORAL | Status: DC
  Administered 2021-03-14 – 2021-03-15 (×3): 500 mg via ORAL
  Filled 2021-03-14 (×3): qty 1

## 2021-03-14 NOTE — ED Notes (Signed)
Introduced self to patient in his room, obtained vitals, and asked patient about snacks he would like when night time medications are passed out.  Pt calm and cooperative, lying in bed watching TV, and denies other needs at this time.

## 2021-03-14 NOTE — ED Notes (Signed)
Lunch tray given. 

## 2021-03-14 NOTE — ED Notes (Signed)
Hourly rounding completed at this time, patient currently asleep in room. No complaints, stable, and in no acute distress. Q15 minute rounds and monitoring via Security Cameras to continue. 

## 2021-03-14 NOTE — ED Notes (Signed)
Gave breakfast tray with juice. 

## 2021-03-14 NOTE — ED Notes (Signed)
Patient transferred from ED to [facility] room 1 after screening for contraband. Report received from Jeannett Senior, RN including Situation, Background, Assessment and Recommendations. Pt oriented to unit including Q15 minute rounds as well as the security cameras for their protection. Patient is alert and oriented, warm and dry in no acute distress. Patient denies SI, HI, and AVH. Pt. Encouraged to let this nurse know if needs arise.

## 2021-03-14 NOTE — ED Notes (Signed)
IVC   Kynnedy Carreno, Layla Maw, DO 03/14/21 [phone]

## 2021-03-14 NOTE — Consult Note (Signed)
[facility] Face-to-Face Psychiatry Consult   Reason for Consult: Consult for 35 year old man came to the emergency room requesting help with mood and hallucinations Referring Physician: Su Hoff Patient Identification: Jeremy Frank MRN:  [id number] Principal Diagnosis: Amphetamine and psychostimulant-induced psychosis with hallucinations (HCC) Diagnosis:  Principal Problem:   Amphetamine and psychostimulant-induced psychosis with hallucinations (HCC) Active Problems:   Psychosis (HCC)   Posttraumatic stress disorder   Cocaine dependence (HCC)   Marijuana abuse   Amphetamine abuse (HCC)   Total Time spent with patient: 1 hour  Subjective:   Jeremy Frank is a 35 y.o. male patient admitted with "I started to have seeing things".  HPI: Patient seen chart reviewed.  Patient known from previous encounters.  35 year old man with a past history of PTSD and head injury and substance abuse.  Reports that he got out of jail about a week ago.  Was not given any medicine at discharge.  Has relapsed into using drugs and is using cocaine and alcohol daily.  Yesterday started having hallucinations.  The hallucinations sound like they are mainly visual although there are some vague sounds associated with them as well.  Mood is been anxious and dysphoric.  He is requesting to be back on medications specifically Depakote which she thinks really helped with mood stability in the past.  Patient has been staying with his mother but says he cannot stay there most of the time.  He reports having some vague thoughts of negativity towards himself but denies any suicidal intent currently.  Denies homicidal intent.  He is requesting some kind of therapy or place to stay but also requesting to be back on Depakote.  Past Psychiatric History: Past history of multiple visits to the emergency room under similar circumstances.  Tends to have hallucinations when he is abusing stimulants which seems to be pretty often.  [facility]  [facility]  Risk to Self:   Risk to Others:   Prior Inpatient Therapy:   Prior Outpatient Therapy:    Past Medical History:  Past Medical History:  Diagnosis Date  . Drug dependence (HCC)   . GI bleed   . Hiatal hernia   . Hypertension   . PTSD (post-traumatic stress disorder)     Past Surgical History:  Procedure Laterality Date  . KNEE SURGERY     Family History: No family history on file. Family Psychiatric  History: None reported Social History:  Social History   Substance and Sexual Activity  Alcohol Use Yes     Social History   Substance and Sexual Activity  Drug Use Yes  . Types: Cocaine, Marijuana    Social History   Socioeconomic History  . Marital status: Single    Spouse name: Not on file  . Number of children: Not on file  . Years of education: Not on file  . Highest education level: Not on file  Occupational History  . Not on file  Tobacco Use  . Smoking status: Current Every Day Smoker    Types: Cigarettes  . Smokeless tobacco: Never Used  Substance and Sexual Activity  . Alcohol use: Yes  . Drug use: Yes    Types: Cocaine, Marijuana  . Sexual activity: Not on file  Other Topics Concern  . Not on file  Social History Narrative  . Not on file   Social Determinants of Health   Financial Resource Strain: Not on file  Food Insecurity: Not on file  Transportation Needs: Not on file  Physical Activity: Not on file  Stress: Not on file  Social Connections: Not on file   Additional Social History:    Allergies:   Allergies  Allergen Reactions  . Cheese Diarrhea  . Eggs Or Egg-Derived Products Diarrhea  . Peanut-Containing Drug Products Diarrhea    Labs:  Results for orders placed or performed during the hospital encounter of 03/13/21 (from the past 48 hour(s))  Comprehensive metabolic panel     Status: Abnormal   Collection Time: 03/13/21 10:48 PM  Result Value Ref Range   Sodium 138  135 - 145 mmol/L   Potassium 3.4 (L) 3.5 - 5.1 mmol/L   Chloride 108 98 - 111 mmol/L   CO2 22 22 - 32 mmol/L   Glucose, Bld 84 70 - 99 mg/dL    Comment: Glucose reference range applies only to samples taken after fasting for at least 8 hours.   BUN 20 6 - 20 mg/dL   Creatinine, Ser 7.89 0.61 - 1.24 mg/dL   Calcium 8.8 (L) 8.9 - 10.3 mg/dL   Total Protein 7.7 6.5 - 8.1 g/dL   Albumin 4.3 3.5 - 5.0 g/dL   AST 30 15 - 41 U/L   ALT 18 0 - 44 U/L   Alkaline Phosphatase 62 38 - 126 U/L   Total Bilirubin 0.5 0.3 - 1.2 mg/dL   GFR, Estimated >38 >10 mL/min    Comment: (NOTE) Calculated using the CKD-EPI Creatinine Equation (2021)    Anion gap 8 5 - 15    Comment: Performed at [facility], [address] [place], [place] [address]  Ethanol     Status: Abnormal   Collection Time: 03/13/21 10:48 PM  Result Value Ref Range   Alcohol, Ethyl (B) 49 (H) <10 mg/dL    Comment: (NOTE) Lowest detectable limit for serum alcohol is 10 mg/dL.  For medical purposes only. Performed at [facility], [address] [place], [place] [address]   Salicylate level     Status: Abnormal   Collection Time: 03/13/21 10:48 PM  Result Value Ref Range   Salicylate Lvl <7.0 (L) 7.0 - 30.0 mg/dL    Comment: Performed at [facility], [address] [place], [place] [address]  Acetaminophen level     Status: Abnormal   Collection Time: 03/13/21 10:48 PM  Result Value Ref Range   Acetaminophen (Tylenol), Serum <10 (L) 10 - 30 ug/mL    Comment: (NOTE) Therapeutic concentrations vary significantly. A range of 10-30 ug/mL  may be an effective concentration for many patients. However, some  are best treated at concentrations outside of this range. Acetaminophen concentrations >150 ug/mL at 4 hours after ingestion  and >50 ug/mL at 12 hours after ingestion are often associated with  toxic reactions.  Performed at [facility], [address] [place], [place]  [address]   cbc     Status: Abnormal   Collection Time: 03/13/21 10:48 PM  Result Value Ref Range   WBC 10.1 4.0 - 10.5 K/uL   RBC 4.36 4.22 - 5.81 MIL/uL   Hemoglobin 13.0 13.0 - 17.0 g/dL   HCT 14.4 (L) 31.5 - 40.0 %   MCV 86.9 80.0 - 100.0 fL   MCH 29.8 26.0 - 34.0 pg   MCHC 34.3 30.0 - 36.0 g/dL   RDW 86.7 61.9 - 50.9 %   Platelets 175 150 - 400 K/uL   nRBC 0.0 0.0 - 0.2 %  Comment: Performed at [facility], [address] [place], [place] [address]  Valproic acid level     Status: Abnormal   Collection Time: 03/13/21 10:48 PM  Result Value Ref Range   Valproic Acid Lvl <10 (L) 50.0 - 100.0 ug/mL    Comment: RESULT CONFIRMED BY MANUAL DILUTION SKL Performed at [facility], [address] [place], [place] [address]   Urine Drug Screen, Qualitative     Status: Abnormal   Collection Time: 03/13/21 11:28 PM  Result Value Ref Range   Tricyclic, Ur Screen NONE DETECTED NONE DETECTED   Amphetamines, Ur Screen NONE DETECTED NONE DETECTED   MDMA (Ecstasy)Ur Screen NONE DETECTED NONE DETECTED   Cocaine Metabolite,Ur [place] POSITIVE (A) NONE DETECTED   Opiate, Ur Screen NONE DETECTED NONE DETECTED   Phencyclidine (PCP) Ur S NONE DETECTED NONE DETECTED   Cannabinoid 50 Ng, Ur [place] POSITIVE (A) NONE DETECTED   Barbiturates, Ur Screen NONE DETECTED NONE DETECTED   Benzodiazepine, Ur Scrn NONE DETECTED NONE DETECTED   Methadone Scn, Ur NONE DETECTED NONE DETECTED    Comment: (NOTE) Tricyclics + metabolites, urine    Cutoff 1000 ng/mL Amphetamines + metabolites, urine  Cutoff 1000 ng/mL MDMA (Ecstasy), urine              Cutoff 500 ng/mL Cocaine Metabolite, urine          Cutoff 300 ng/mL Opiate + metabolites, urine        Cutoff 300 ng/mL Phencyclidine (PCP), urine         Cutoff 25 ng/mL Cannabinoid, urine                 Cutoff 50 ng/mL Barbiturates + metabolites, urine  Cutoff 200 ng/mL Benzodiazepine, urine              Cutoff 200 ng/mL Methadone, urine                    Cutoff 300 ng/mL  The urine drug screen provides only a preliminary, unconfirmed analytical test result and should not be used for non-medical purposes. Clinical consideration and professional judgment should be applied to any positive drug screen result due to possible interfering substances. A more specific alternate chemical method must be used in order to obtain a confirmed analytical result. Gas chromatography / mass spectrometry (GC/MS) is the preferred confirm atory method. Performed at [facility], [address] [place], [place] [address]   Resp Panel by RT-PCR (Flu A&B, Covid) Nasopharyngeal Swab     Status: None   Collection Time: 03/13/21 11:28 PM   Specimen: Nasopharyngeal Swab; Nasopharyngeal(NP) swabs in vial transport medium  Result Value Ref Range   SARS Coronavirus 2 by RT PCR NEGATIVE NEGATIVE    Comment: (NOTE) SARS-CoV-2 target nucleic acids are NOT DETECTED.  The SARS-CoV-2 RNA is generally detectable in upper respiratory specimens during the acute phase of infection. The lowest concentration of SARS-CoV-2 viral copies this assay can detect is 138 copies/mL. A negative result does not preclude SARS-Cov-2 infection and should not be used as the sole basis for treatment or other patient management decisions. A negative result may occur with  improper specimen collection/handling, submission of specimen other than nasopharyngeal swab, presence of viral mutation(s) within the areas targeted by this assay, and inadequate number of viral copies(<138 copies/mL). A negative result must be combined with clinical observations, patient history, and epidemiological information. The expected result is Negative.  Fact Sheet for Patients:  BloggerCourse.com  Fact Sheet for Healthcare Providers:  SeriousBroker.ithttps://www.fda.gov/media/152162/download  This test is no t yet approved or cleared by the [place] FDA and  has been  authorized for detection and/or diagnosis of SARS-CoV-2 by FDA under an Emergency Use Authorization (EUA). This EUA will remain  in effect (meaning this test can be used) for the duration of the COVID-19 declaration under Section 564(b)(1) of the Act, 21 U.S.C.section 360bbb-3(b)(1), unless the authorization is terminated  or revoked sooner.       Influenza A by PCR NEGATIVE NEGATIVE   Influenza B by PCR NEGATIVE NEGATIVE    Comment: (NOTE) The Xpert Xpress SARS-CoV-2/FLU/RSV plus assay is intended as an aid in the diagnosis of influenza from Nasopharyngeal swab specimens and should not be used as a sole basis for treatment. Nasal washings and aspirates are unacceptable for Xpert Xpress SARS-CoV-2/FLU/RSV testing.  Fact Sheet for Patients: BloggerCourse.comhttps://www.fda.gov/media/152166/download  Fact Sheet for Healthcare Providers: SeriousBroker.ithttps://www.fda.gov/media/152162/download  This test is not yet approved or cleared by the [place] FDA and has been authorized for detection and/or diagnosis of SARS-CoV-2 by FDA under an Emergency Use Authorization (EUA). This EUA will remain in effect (meaning this test can be used) for the duration of the COVID-19 declaration under Section 564(b)(1) of the Act, 21 U.S.C. section 360bbb-3(b)(1), unless the authorization is terminated or revoked.  Performed at [facility], [address] [place], [place] [id number]     Current Facility-Administered Medications  Medication Dose Route Frequency Provider Last Rate Last Admin  . divalproex (DEPAKOTE) DR tablet 500 mg  500 mg Oral Q12H Toinette Lackie T, MD   500 mg at 03/14/21 1024  . ibuprofen (ADVIL) tablet 800 mg  800 mg Oral Q8H PRN Melanny Wire T, MD   800 mg at 03/14/21 1024   Current Outpatient Medications  Medication Sig Dispense Refill  . gabapentin (NEURONTIN) 300 MG capsule Take 300 mg by mouth 3 (three) times daily. (Patient not taking: Reported on 03/14/2021)    . omeprazole  (PRILOSEC) 40 MG capsule Take 40 mg by mouth daily. (Patient not taking: Reported on 03/14/2021)      Musculoskeletal: Strength & Muscle Tone: within normal limits Gait & Station: normal Patient leans: N/A            Psychiatric Specialty Exam:  Presentation  General Appearance: Bizarre  Eye Contact:Poor  Speech:Clear and Coherent  Speech Volume:Normal  Handedness:Right   Mood and Affect  Mood:Anxious; Hopeless  Affect:Blunt; Depressed; Flat; Inappropriate   Thought Process  Thought Processes:Coherent  Descriptions of Associations:Intact  Orientation:Full (Time, Place and Person)  Thought Content:Obsessions; Paranoid Ideation; Rumination; Tangential  History of Schizophrenia/Schizoaffective disorder:No  Duration of Psychotic Symptoms:Less than six months  Hallucinations:Hallucinations: Auditory  Ideas of Reference:Paranoia  Suicidal Thoughts:Suicidal Thoughts: Yes, Passive SI Passive Intent and/or Plan: Without Intent  Homicidal Thoughts:Homicidal Thoughts: No   Sensorium  Memory:Immediate Good; Recent Good; Remote Good  Judgment:Fair  Insight:Lacking   Executive Functions  Concentration:Fair  Attention Span:Fair  Recall:Fair  Fund of Knowledge:Fair  Language:Fair   Psychomotor Activity  Psychomotor Activity:Psychomotor Activity: Normal   Assets  Assets:Communication Skills; Housing; Resilience; Social Support   Sleep  Sleep:Sleep: Fair   Physical Exam: Physical Exam Vitals and nursing note reviewed.  Constitutional:      Appearance: Normal appearance.  HENT:     Head: Normocephalic and atraumatic.     Mouth/Throat:     Pharynx: Oropharynx is clear.  Eyes:     Pupils: Pupils are equal, round, and reactive  to light.  Cardiovascular:     Rate and Rhythm: Normal rate and regular rhythm.  Pulmonary:     Effort: Pulmonary effort is normal.     Breath sounds: Normal breath sounds.  Abdominal:     General: Abdomen  is flat.     Palpations: Abdomen is soft.  Musculoskeletal:        General: Normal range of motion.  Skin:    General: Skin is warm and dry.  Neurological:     General: No focal deficit present.     Mental Status: He is alert. Mental status is at baseline.  Psychiatric:        Attention and Perception: He is inattentive.        Mood and Affect: Mood is anxious. Affect is blunt.        Speech: Speech is delayed.        Behavior: Behavior is slowed.        Thought Content: Thought content normal. Thought content does not include suicidal ideation.        Cognition and Memory: Memory is impaired.        Judgment: Judgment is impulsive.    Review of Systems  Constitutional: Negative.   HENT: Negative.   Eyes: Negative.   Respiratory: Negative.   Cardiovascular: Negative.   Gastrointestinal: Negative.   Musculoskeletal: Negative.   Skin: Negative.   Neurological: Negative.   Psychiatric/Behavioral: Positive for depression, hallucinations, memory loss and substance abuse. The patient is nervous/anxious and has insomnia.    Blood pressure 133/84, pulse 66, temperature 97.9 F (36.6 C), temperature source Oral, resp. rate 16, height 5\' 11"  (1.803 m), weight 104.3 kg, SpO2 98 %. Body mass index is 32.08 kg/m.  Treatment Plan Summary: Plan Calm healthy-appearing young man cooperative with the interview.  Denies acute suicidal intent but reports suicidal ideation in the last couple days.  Relapsed into abuse of stimulants and not using any current medication.  Patient is requesting help with a place to stay and longer term treatment.  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  Patient says that he does not want to go to the [place] but I think it is our obligation since that is his provider and where his medical insurance is to check with them first.  I have restarted the Depakote.  Also ordered some medicine for his complaint of a headache.  Not ready  or clearly indicated for admission.  We will reassess once he calms down a little bit and get some medicine.  Disposition: No evidence of imminent risk to self or others at present.   Supportive therapy provided about ongoing stressors. Discussed crisis plan, support from social network, calling 911, coming to the Emergency Department, and calling Suicide Hotline. Restart medication  Mordecai Rasmussen, MD 03/14/2021 11:03 AM

## 2021-03-14 NOTE — Consult Note (Signed)
[facility] Face-to-Face Psychiatry Consult   Reason for Consult: Psychiatric Evaluation Referring Physician: Dr. Elesa Massed Patient Identification: Jeremy Frank MRN:  [id number] Principal Diagnosis: <principal problem not specified> Diagnosis:  Active Problems:   Psychosis (HCC)   Posttraumatic stress disorder   Cocaine dependence (HCC)   Marijuana abuse   Amphetamine abuse (HCC)   Amphetamine and psychostimulant-induced psychosis with hallucinations (HCC)   Total Time spent with patient: 20 minutes   Subjective: " I have not had my medication for several weeks.  I am a Veteran."  Jeremy Frank is a 35 y.o. male  patient presented to [facility] ED via POV voluntarily due to the patient expressing that he has been off his medication for a week.  The patient stated he was released from jail recently and has not been able to connect with services.  The patient also disclosed he is a veteran and has not been able to get to the [facility] hospital for his psychiatric care. The patient was seen face-to-face by this provider; the chart was reviewed and consulted with Dr. Elesa Massed on 03/13/2021 due to the patient's care. It was discussed with the EDP that the patient does meet the criteria to be admitted to the psychiatric inpatient unit.   It was also discussed with Dr. Elesa Massed to connect the patient with social work to assist him in getting back to the [place] to begin treatment for his mental health. On evaluation, the patient is alert and oriented x 4, calm and cooperative, and mood-congruent with affect. The patient does not appear to be responding to internal or external stimuli. Neither is the patient presenting with any delusional thinking. The patient admits auditory and visual hallucinations. The patient states, "I see Martian."  The patient was still late in answering if he was suicidal? The patient denies homicidal or self-harm ideations. The patient is not presenting with any psychotic behavior. The patient does present  with paranoid behaviors. During an encounter with the patient, he was able to answer questions appropriately.  HPI: Per Dr. Elesa Massed, Jeremy Frank is a 36 y.o. male with history of polysubstance abuse, psychosis who presents to the emergency department with complaints of feeling suicidal after not being on his medication for the past week.  He states that he has been out of Depakote for a week and he is hearing and seeing things and it makes him want to harm himself.  States he is hearing what sounds like a "radio".  He denies any thoughts wanting to harm anyone else.  He states "I want to do anything a rational like being my head into the wall".  He reports using cocaine and marijuana as well as alcohol today.  States he is "self-medicating". Patient is homeless.  He was released from incarceration in December.  He is a Cytogeneticist.  Past Psychiatric History:  Drug dependence (HCC) PTSD (posttraumatic stress disorder)  Risk to Self:  Yes Risk to Others:  No Prior Inpatient Therapy:  Unknown Prior Outpatient Therapy:  Yes  Past Medical History:  Past Medical History:  Diagnosis Date  . Drug dependence (HCC)   . GI bleed   . Hiatal hernia   . Hypertension   . PTSD (post-traumatic stress disorder)     Past Surgical History:  Procedure Laterality Date  . KNEE SURGERY     Family History: No family history on file. Family Psychiatric  History:  Social History:  Social History   Substance and Sexual Activity  Alcohol Use Yes  Social History   Substance and Sexual Activity  Drug Use Yes  . Types: Cocaine, Marijuana    Social History   Socioeconomic History  . Marital status: Single    Spouse name: Not on file  . Number of children: Not on file  . Years of education: Not on file  . Highest education level: Not on file  Occupational History  . Not on file  Tobacco Use  . Smoking status: Current Every Day Smoker    Types: Cigarettes  . Smokeless tobacco: Never Used   Substance and Sexual Activity  . Alcohol use: Yes  . Drug use: Yes    Types: Cocaine, Marijuana  . Sexual activity: Not on file  Other Topics Concern  . Not on file  Social History Narrative  . Not on file   Social Determinants of Health   Financial Resource Strain: Not on file  Food Insecurity: Not on file  Transportation Needs: Not on file  Physical Activity: Not on file  Stress: Not on file  Social Connections: Not on file   Additional Social History:    Allergies:   Allergies  Allergen Reactions  . Cheese Diarrhea  . Eggs Or Egg-Derived Products Diarrhea  . Peanut-Containing Drug Products Diarrhea    Labs:  Results for orders placed or performed during the hospital encounter of 03/13/21 (from the past 48 hour(s))  Comprehensive metabolic panel     Status: Abnormal   Collection Time: 03/13/21 10:48 PM  Result Value Ref Range   Sodium 138 135 - 145 mmol/L   Potassium 3.4 (L) 3.5 - 5.1 mmol/L   Chloride 108 98 - 111 mmol/L   CO2 22 22 - 32 mmol/L   Glucose, Bld 84 70 - 99 mg/dL    Comment: Glucose reference range applies only to samples taken after fasting for at least 8 hours.   BUN 20 6 - 20 mg/dL   Creatinine, Ser 6.961.15 0.61 - 1.24 mg/dL   Calcium 8.8 (L) 8.9 - 10.3 mg/dL   Total Protein 7.7 6.5 - 8.1 g/dL   Albumin 4.3 3.5 - 5.0 g/dL   AST 30 15 - 41 U/L   ALT 18 0 - 44 U/L   Alkaline Phosphatase 62 38 - 126 U/L   Total Bilirubin 0.5 0.3 - 1.2 mg/dL   GFR, Estimated >29>60 >52>60 mL/min    Comment: (NOTE) Calculated using the CKD-EPI Creatinine Equation (2021)    Anion gap 8 5 - 15    Comment: Performed at [facility], [address] [place], [place] [address]  Ethanol     Status: Abnormal   Collection Time: 03/13/21 10:48 PM  Result Value Ref Range   Alcohol, Ethyl (B) 49 (H) <10 mg/dL    Comment: (NOTE) Lowest detectable limit for serum alcohol is 10 mg/dL.  For medical purposes only. Performed at [facility], [address] [place], [place] [address]   Salicylate level     Status: Abnormal   Collection Time: 03/13/21 10:48 PM  Result Value Ref Range   Salicylate Lvl <7.0 (L) 7.0 - 30.0 mg/dL    Comment: Performed at [facility], [address] [place], [place] [address]  Acetaminophen level     Status: Abnormal   Collection Time: 03/13/21 10:48 PM  Result Value Ref Range   Acetaminophen (Tylenol), Serum <10 (L) 10 - 30 ug/mL    Comment: (NOTE) Therapeutic concentrations vary significantly. A range of 10-30 ug/mL  may be an  effective concentration for many patients. However, some  are best treated at concentrations outside of this range. Acetaminophen concentrations >150 ug/mL at 4 hours after ingestion  and >50 ug/mL at 12 hours after ingestion are often associated with  toxic reactions.  Performed at [facility], [address] [place], [place] [address]   cbc     Status: Abnormal   Collection Time: 03/13/21 10:48 PM  Result Value Ref Range   WBC 10.1 4.0 - 10.5 K/uL   RBC 4.36 4.22 - 5.81 MIL/uL   Hemoglobin 13.0 13.0 - 17.0 g/dL   HCT 95.2 (L) 84.1 - 32.4 %   MCV 86.9 80.0 - 100.0 fL   MCH 29.8 26.0 - 34.0 pg   MCHC 34.3 30.0 - 36.0 g/dL   RDW 40.1 02.7 - 25.3 %   Platelets 175 150 - 400 K/uL   nRBC 0.0 0.0 - 0.2 %    Comment: Performed at [facility], [address] [place], [place] [address]  Valproic acid level     Status: Abnormal   Collection Time: 03/13/21 10:48 PM  Result Value Ref Range   Valproic Acid Lvl <10 (L) 50.0 - 100.0 ug/mL    Comment: RESULT CONFIRMED BY MANUAL DILUTION SKL Performed at [facility], [address] [place], [place] [address]   Urine Drug Screen, Qualitative     Status: Abnormal   Collection Time: 03/13/21 11:28 PM  Result Value Ref Range   Tricyclic, Ur Screen NONE DETECTED NONE DETECTED   Amphetamines, Ur Screen NONE DETECTED NONE DETECTED   MDMA (Ecstasy)Ur Screen NONE DETECTED NONE DETECTED   Cocaine  Metabolite,Ur [place] POSITIVE (A) NONE DETECTED   Opiate, Ur Screen NONE DETECTED NONE DETECTED   Phencyclidine (PCP) Ur S NONE DETECTED NONE DETECTED   Cannabinoid 50 Ng, Ur [place] POSITIVE (A) NONE DETECTED   Barbiturates, Ur Screen NONE DETECTED NONE DETECTED   Benzodiazepine, Ur Scrn NONE DETECTED NONE DETECTED   Methadone Scn, Ur NONE DETECTED NONE DETECTED    Comment: (NOTE) Tricyclics + metabolites, urine    Cutoff 1000 ng/mL Amphetamines + metabolites, urine  Cutoff 1000 ng/mL MDMA (Ecstasy), urine              Cutoff 500 ng/mL Cocaine Metabolite, urine          Cutoff 300 ng/mL Opiate + metabolites, urine        Cutoff 300 ng/mL Phencyclidine (PCP), urine         Cutoff 25 ng/mL Cannabinoid, urine                 Cutoff 50 ng/mL Barbiturates + metabolites, urine  Cutoff 200 ng/mL Benzodiazepine, urine              Cutoff 200 ng/mL Methadone, urine                   Cutoff 300 ng/mL  The urine drug screen provides only a preliminary, unconfirmed analytical test result and should not be used for non-medical purposes. Clinical consideration and professional judgment should be applied to any positive drug screen result due to possible interfering substances. A more specific alternate chemical method must be used in order to obtain a confirmed analytical result. Gas chromatography / mass spectrometry (GC/MS) is the preferred confirm atory method. Performed at [facility], [address] [place], [place] [address]   Resp Panel by RT-PCR (Flu A&B, Covid) Nasopharyngeal Swab     Status: None  Collection Time: 03/13/21 11:28 PM   Specimen: Nasopharyngeal Swab; Nasopharyngeal(NP) swabs in vial transport medium  Result Value Ref Range   SARS Coronavirus 2 by RT PCR NEGATIVE NEGATIVE    Comment: (NOTE) SARS-CoV-2 target nucleic acids are NOT DETECTED.  The SARS-CoV-2 RNA is generally detectable in upper respiratory specimens during the acute phase of infection. The  lowest concentration of SARS-CoV-2 viral copies this assay can detect is 138 copies/mL. A negative result does not preclude SARS-Cov-2 infection and should not be used as the sole basis for treatment or other patient management decisions. A negative result may occur with  improper specimen collection/handling, submission of specimen other than nasopharyngeal swab, presence of viral mutation(s) within the areas targeted by this assay, and inadequate number of viral copies(<138 copies/mL). A negative result must be combined with clinical observations, patient history, and epidemiological information. The expected result is Negative.  Fact Sheet for Patients:  BloggerCourse.com  Fact Sheet for Healthcare Providers:  SeriousBroker.it  This test is no t yet approved or cleared by the [place] FDA and  has been authorized for detection and/or diagnosis of SARS-CoV-2 by FDA under an Emergency Use Authorization (EUA). This EUA will remain  in effect (meaning this test can be used) for the duration of the COVID-19 declaration under Section 564(b)(1) of the Act, 21 U.S.C.section 360bbb-3(b)(1), unless the authorization is terminated  or revoked sooner.       Influenza A by PCR NEGATIVE NEGATIVE   Influenza B by PCR NEGATIVE NEGATIVE    Comment: (NOTE) The Xpert Xpress SARS-CoV-2/FLU/RSV plus assay is intended as an aid in the diagnosis of influenza from Nasopharyngeal swab specimens and should not be used as a sole basis for treatment. Nasal washings and aspirates are unacceptable for Xpert Xpress SARS-CoV-2/FLU/RSV testing.  Fact Sheet for Patients: BloggerCourse.com  Fact Sheet for Healthcare Providers: SeriousBroker.it  This test is not yet approved or cleared by the [place] FDA and has been authorized for detection and/or diagnosis of SARS-CoV-2 by FDA under an Emergency  Use Authorization (EUA). This EUA will remain in effect (meaning this test can be used) for the duration of the COVID-19 declaration under Section 564(b)(1) of the Act, 21 U.S.C. section 360bbb-3(b)(1), unless the authorization is terminated or revoked.  Performed at [facility], [address] [place], [place] [address]     No current facility-administered medications for this encounter.   Current Outpatient Medications  Medication Sig Dispense Refill  . gabapentin (NEURONTIN) 300 MG capsule Take 300 mg by mouth 3 (three) times daily. (Patient not taking: Reported on 03/14/2021)    . omeprazole (PRILOSEC) 40 MG capsule Take 40 mg by mouth daily. (Patient not taking: Reported on 03/14/2021)      Musculoskeletal: Strength & Muscle Tone: within normal limits Gait & Station: normal Patient leans: N/A  Psychiatric Specialty Exam:  Presentation  General Appearance: Bizarre  Eye Contact:Poor  Speech:Clear and Coherent  Speech Volume:Normal  Handedness:Right   Mood and Affect  Mood:Anxious; Hopeless  Affect:Blunt; Depressed; Flat; Inappropriate   Thought Process  Thought Processes:Coherent  Descriptions of Associations:Intact  Orientation:Full (Time, Place and Person)  Thought Content:Obsessions; Paranoid Ideation; Rumination; Tangential  History of Schizophrenia/Schizoaffective disorder:No  Duration of Psychotic Symptoms:Greater than six months  Hallucinations:Hallucinations: Auditory  Ideas of Reference:Paranoia  Suicidal Thoughts:Suicidal Thoughts: Yes, Passive SI Passive Intent and/or Plan: Without Intent  Homicidal Thoughts:Homicidal Thoughts: No   Sensorium  Memory:Immediate Good; Recent Good; Remote Good  Judgment:Fair  Insight:Lacking   Executive  Functions  Concentration:Fair  Attention Span:Fair  Recall:Fair  Fund of Knowledge:Fair  Language:Fair   Psychomotor Activity  Psychomotor Activity:Psychomotor Activity:  Normal   Assets  Assets:Communication Skills; Housing; Resilience; Social Support   Sleep  Sleep:Sleep: Fair   Physical Exam: Physical Exam Constitutional:      General: He is in acute distress.     Appearance: Normal appearance.  HENT:     Right Ear: External ear normal.     Left Ear: External ear normal.     Nose: Nose normal.     Mouth/Throat:     Mouth: Mucous membranes are dry.  Eyes:     Conjunctiva/sclera: Conjunctivae normal.  Cardiovascular:     Pulses: Normal pulses.  Pulmonary:     Effort: Pulmonary effort is normal.  Musculoskeletal:        General: Normal range of motion.     Cervical back: Normal range of motion and neck supple.  Neurological:     General: No focal deficit present.     Mental Status: He is alert and oriented to person, place, and time.  Psychiatric:        Attention and Perception: Attention and perception normal.        Mood and Affect: Mood is depressed. Affect is blunt and inappropriate.        Speech: Speech is tangential.        Behavior: Behavior is withdrawn.        Thought Content: Thought content is paranoid.        Judgment: Judgment is inappropriate.    Review of Systems  Psychiatric/Behavioral: Positive for depression, hallucinations and substance abuse. The patient is nervous/anxious and has insomnia.   All other systems reviewed and are negative.  Blood pressure (!) 161/111, pulse 93, temperature 98.5 F (36.9 C), temperature source Oral, resp. rate 18, height 5\' 11"  (1.803 m), weight 104.3 kg, SpO2 98 %. Body mass index is 32.08 kg/m.  Treatment Plan Summary: Plan The patient is a safety risk to himself and others and requires psychiatric inpatient admission for stabilization and treatment.  Disposition: The patient does meet criteria for psychiatric inpatient admission once medically clear. The patient is a safety risk to himself and others and requires psychiatric inpatient admission for stabilization and  treatment.   , NP 03/14/2021 1:53 AM

## 2021-03-14 NOTE — ED Notes (Signed)
Pt given sprite and graham crackers.  Resting comfortably in bed, denies other needs at this time.

## 2021-03-14 NOTE — ED Notes (Signed)
Meal tray given 

## 2021-03-14 NOTE — BH Assessment (Signed)
Paperwork for [employer] completed and confirmed it was received. ([phone]). Patient was pending review and was second in line. However, the ER had several patients to come and they will take priority over him. Was advised to check back in the morning. Will possibly have a bed for him.

## 2021-03-14 NOTE — ED Notes (Signed)
Pt provided with remote and tv. Pt denies any needs at this time

## 2021-03-14 NOTE — TOC Progression Note (Signed)
Transition of Care [facility]) - Progression Note    Patient Details  Name: Jeremy Frank MRN: [id number] Date of Birth: [birth year]  [facility]) CM/SW Contact  Marina Goodell Phone Number: [phone] 03/14/2021, 8:53 AM  Clinical Narrative:     Patient IVC, meets criteria for inpatient psychiatric care.       Expected Discharge Plan and Services                                                 Social Determinants of Health (SDOH) Interventions SDOH Interventions for the Following Domains: Alcohol Usage,Housing  Readmission Risk Interventions No flowsheet data found.

## 2021-03-14 NOTE — BH Assessment (Signed)
Comprehensive Clinical Assessment (CCA) Note  03/14/2021 Jeremy Frank [id number]   Jeremy Frank, 35 yr old male who presents to [facility] ED voluntarily for treatment. Per triage note, Patient states that he has been off of his medication for about a week. Patient states that he is starting to hallucinate. Patient states that he drinks alcohol and uses drugs to self medicate. Patient states that when he self medicates it makes him have thoughts of wanting to hurt himself.   During TTS assessment pt presents alert and oriented x 4, anxious but cooperative, and mood-congruent with affect. The pt does not appear to be responding to internal or external stimuli. Neither is the pt presenting with any delusional thinking. Pt verified the information provided to triage RN.   Pt identifies his main complaint to be that he is hallucinating and has not taken his psych medication in 10 days. Patient reports he was recently released from jail 1 week ago after spending 28 days in prison for vandalism. Patient states that due to his incarceration he "lost everything". Patient states he is homeless and does not have any family support. " It is very overwhelming." Pt reports SA with cocaine and alcohol. Patient states he does this as a form of "self-medicating" to ease the pain. Pt reports INPT hx and OPT hx at the [facility] in [place], [place]. Patient reports his most recent treatment was December 2021; however, he has not seen a psychiatrist since that time. Patient reports he is not sleeping well and has periods of binge eating. Pt endorses vague SI stating he has "thought about it but not really." Patient reports AH/VH. " I see a militant figure and the voices I hear are trying to exploit me." Patient states he is in need of some type of medication.    Per Annice Pih, NP, pt is recommended for inpatient treatment.    Chief Complaint:  Chief Complaint  Patient presents with  . Psychiatric Evaluation   Visit Diagnosis:  Psychosis   CCA Screening, Triage and Referral (STR)  Patient Reported Information How did you hear about [place]? Self  Referral name: No data recorded Referral phone number: No data recorded  Whom do you see for routine medical problems? 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 Patient reports he has been hallucinating and seeing a "militant figure".  How Long Has This Been Causing You Problems? 1 wk - 1 month  What Do You Feel Would Help You the Most Today? Alcohol or Drug Use Treatment; Housing Assistance; Medication(s)   Have You Recently Been in Any Inpatient Treatment (Hospital/Detox/Crisis Center/28-Day Program)? Yes  Name/Location of Program/Hospital:[facility] in [place], [place]  How Long Were You There? 2 weeks  When Were You Discharged? No data recorded  Have You Ever Received Services From [facility] Before? No data recorded Who Do You See at [facility]? No data recorded  Have You Recently Had Any Thoughts About Hurting Yourself? Yes  Are You Planning to Commit Suicide/Harm Yourself At This time? No   Have you Recently Had Thoughts About Hurting Someone Jeremy Frank? No  Explanation: No data recorded  Have You Used Any Alcohol or Drugs  in the Past 24 Hours? Yes  How Long Ago Did You Use Drugs or Alcohol? No data recorded What Did You Use and How Much? cocaine and alcohol   Do You Currently Have a Therapist/Psychiatrist? Yes  Name of Therapist/Psychiatrist: No data recorded  Have You Been Recently Discharged From Any Office Practice or Programs? No data recorded Explanation of Discharge From Practice/Program: No  data recorded    CCA Screening Triage Referral Assessment Type of Contact: Face-to-Face  Is this Initial or Reassessment? No data recorded Date Telepsych consult ordered in [facility]:  10/19/2020  Time Telepsych consult ordered in [facility]:  0935   Patient Reported Information Reviewed? Yes  Patient Left Without Being Seen? No data recorded Reason for Not Completing Assessment: No data recorded  Collateral Involvement: No data recorded  Does Patient Have a Court Appointed Legal Guardian? No data recorded Name and Contact of Legal Guardian: self  If Minor and Not Living with Parent(s), Who has Custody? n/a  Is CPS involved or ever been involved? Never  Is APS involved or ever been involved? Never   Patient Determined To Be At Risk for Harm To Self or Others Based on Review of Patient Reported Information or Presenting Complaint? No  Method: No data recorded Availability of Means: No data recorded Intent: No data recorded Notification Required: No data recorded Additional Information for Danger to Others Potential: No data recorded Additional Comments for Danger to Others Potential: No data recorded Are There Guns or Other Weapons in Your Home? No data recorded Types of Guns/Weapons: No data recorded Are These Weapons Safely Secured?                            No data recorded Who Could Verify You Are Able To Have These Secured: No data recorded Do You Have any Outstanding Charges, Pending Court Dates, Parole/Probation? No data recorded Contacted To Inform of Risk of Harm To Self or Others: No data recorded  Location of Assessment: [facility] ED   Does Patient Present under Involuntary Commitment? Yes  IVC Papers Initial File Date: 03/14/2021   [place] of Residence: [place]   Patient Currently Receiving the Following Services: Medication Management   Determination of Need: Urgent (48 hours)   Options For Referral: ED Visit; Inpatient Hospitalization; Medication  Management     CCA Biopsychosocial Intake/Chief Complaint:  Patient reports he hallucinating and has not taken his psych medications in 10 days. Patient states he "seld-medicates" with alcohol and cocaine.  Current Symptoms/Problems: Visual and auditory hallucinations, lack of sleep, binge eating   Patient Reported Schizophrenia/Schizoaffective Diagnosis in Past: No   Strengths: Unk  Preferences: Unk  Abilities: Unk   Type of Services Patient Feels are Needed: medication management   Initial Clinical Notes/Concerns: None   Mental Health Symptoms Depression:  Sleep (too much or little); Increase/decrease in appetite   Duration of Depressive symptoms: Greater than two weeks   Mania:  None   Anxiety:   Fatigue; Sleep   Psychosis:  Hallucinations (patient reports having auditory and visual hallucinations. " I see a militant figure and voices are trying to exploit me.")   Duration of Psychotic symptoms: Less than six months   Trauma:  None   Obsessions:  None   Compulsions:  None   Inattention:  None   Hyperactivity/Impulsivity:  N/A   Oppositional/Defiant Behaviors:  None   Emotional Irregularity:  None   Other Mood/Personality Symptoms:  No data recorded  Mental Status Exam Appearance and self-care  Stature:  Average   Weight:  Average weight   Clothing:  Casual   Grooming:  Normal   Cosmetic use:  None   Posture/gait:  Normal   Motor activity:  Not Remarkable   Sensorium  Attention:  Normal   Concentration:  Normal   Orientation:  X5   Recall/memory:  Normal   Affect and Mood  Affect:  Depressed; Anxious   Mood:  Anxious; Depressed   Relating  Eye contact:  Normal   Facial expression:  Anxious; Depressed   Attitude toward examiner:  Cooperative   Thought and Language  Speech flow: Clear and Coherent   Thought content:  Appropriate to Mood and Circumstances   Preoccupation:  None   Hallucinations:  Auditory; Visual    Organization:  No data recorded  [employer] of Knowledge:  Average   Intelligence:  Average   Abstraction:  Normal   Judgement:  Fair   Brewing technologist   Insight:  Fair   Decision Making:  Impulsive   Social Functioning  Social Maturity:  Responsible   Social Judgement:  Normal   Stress  Stressors:  Housing; Armed forces operational officer; Financial   Coping Ability:  Deficient supports; Overwhelmed   Skill Deficits:  Decision making   Supports:  Support needed     Religion: Religion/Spirituality Are You A Religious Person?: No  Leisure/Recreation:    Exercise/Diet: Exercise/Diet Do You Exercise?: No Have You Gained or Lost A Significant Amount of Weight in the Past Six Months?: No Do You Follow a Special Diet?: No Do You Have Any Trouble Sleeping?: Yes Explanation of Sleeping Difficulties: Patient reports he is not getting any rest.   CCA Employment/Education Employment/Work Situation:    Education:     CCA Family/Childhood History Family and Relationship History: Family history What is your sexual orientation?: unk Has your sexual activity been affected by drugs, alcohol, medication, or emotional stress?: unk  Childhood History:  Childhood History Additional childhood history information: unk Description of patient's relationship with caregiver when they were a child: unk Patient's description of current relationship with people who raised him/her: unk How were you disciplined when you got in trouble as a child/adolescent?: unk  Child/Adolescent Assessment:     CCA Substance Use Alcohol/Drug Use: Alcohol / Drug Use Pain Medications: see mar Prescriptions: see mar Over the Counter: see mar History of alcohol / drug use?: Yes Longest period of sobriety (when/how long): unknown Negative Consequences of Use: Legal Substance #1 Name of Substance 1: Cocaine 1 - Age of First Use: unk 1 - Amount (size/oz): unk 1 - Frequency: unk 1 -  Duration: unk 1 - Last Use / Amount: 03/13/21 1 - Method of Aquiring: unk 1- Route of Use: unk Substance #2 Name of Substance 2: Alcohol 2 - Age of First Use: unk 2 - Amount (size/oz): "A lot" 2 - Frequency: Daily 2 - Duration: unk 2 - Last Use / Amount: 03/13/21 2 - Route of Substance Use: oral                     ASAM's:  Six Dimensions of Multidimensional Assessment  Dimension 1:  Acute Intoxication and/or Withdrawal Potential:      Dimension 2:  Biomedical Conditions and Complications:      Dimension 3:  Emotional, Behavioral, or Cognitive Conditions and Complications:     Dimension 4:  Readiness to Change:     Dimension 5:  Relapse, Continued use, or Continued Problem Potential:     Dimension 6:  Recovery/Living Environment:     ASAM Severity Score:    ASAM Recommended Level of Treatment:     Substance use Disorder (SUD) Substance Use Disorder (SUD)  Checklist Symptoms of Substance Use: Continued use despite having a persistent/recurrent physical/psychological problem caused/exacerbated by use,Presence of craving or strong urge to use,Substance(s) often taken in larger amounts or over longer times than was intended  Recommendations for Services/Supports/Treatments: Recommendations for Services/Supports/Treatments Recommendations For Services/Supports/Treatments: SAIOP (Substance Abuse Intensive Outpatient Program),Medication Management  DSM5 Diagnoses: Patient Active Problem List   Diagnosis Date Noted  . 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 Hypertension 10/19/2020  . Psychosis (HCC) 06/13/2015  . Posttraumatic stress disorder 06/13/2015  . 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 Cocaine dependence (HCC) 06/13/2015  . Marijuana abuse 06/13/2015    Patient Centered Plan: Patient is on the following Treatment Plan(s):  Post Traumatic Stress Disorder and Substance  Abuse   Referrals to Alternative Service(s): Referred to Alternative Service(s):   Place:   Date:   Time:    Referred to Alternative Service(s):   Place:   Date:   Time:    Referred to Alternative Service(s):   Place:   Date:   Time:    Referred to Alternative Service(s):   Place:   Date:   Time:     Abie Cheek Dierdre Searles, Counselor, LCAS-A

## 2021-03-14 NOTE — ED Provider Notes (Signed)
Emergency Medicine Observation Re-evaluation Note  Jeremy Frank is a 35 y.o. male, seen on rounds today.  Pt initially presented to the ED for complaints of Psychiatric Evaluation Currently, the patient is laying in bed, denies complaints.  Physical Exam  BP (!) 161/111   Pulse 93   Temp 98.5 F (36.9 C) (Oral)   Resp 18   Ht 5\' 11"  (1.803 m)   Wt 104.3 kg   SpO2 98%   BMI 32.08 kg/m  Physical Exam Constitutional: Resting comfortably. Eyes: Conjunctivae are normal. Head: Atraumatic. Nose: No congestion/rhinnorhea. Mouth/Throat: Mucous membranes are moist. Neck: Normal ROM Cardiovascular: No cyanosis noted. Respiratory: Normal respiratory effort. Gastrointestinal: Non-distended. Genitourinary: deferred Musculoskeletal: No lower extremity tenderness nor edema. Neurologic:  Normal speech and language. No gross focal neurologic deficits are appreciated. Skin:  Skin is warm, dry and intact. No rash noted.    ED Course / MDM  EKG:   I have reviewed the labs performed to date as well as medications administered while in observation.  Recent changes in the last 24 hours include patient evaluated by psych NP, recommending admission.  Plan  Current plan is for psychiatric admission pending bed availability. Patient is under full IVC at this time.   , MD 03/14/21 [phone]

## 2021-03-14 NOTE — ED Notes (Signed)
IVC/ Pending Transfer to [facility]

## 2021-03-15 MED ORDER — DIVALPROEX SODIUM 500 MG PO DR TAB: 500.0000 mg | DELAYED_RELEASE_TABLET | Freq: Two times a day (BID) | ORAL | 1 refills | Status: DC

## 2021-03-15 NOTE — BH Assessment (Signed)
Writer spoke with the patient to complete an updated/reassessment. Patient denies SI/HI and AV/H.  Per Psych MD (Dr. Clapacs), patient does not meet inpatient criteria.  

## 2021-03-15 NOTE — Consult Note (Signed)
[facility] Face-to-Face Psychiatry Consult   Reason for Consult: Follow-up consult 35 year old man with history of cocaine abuse PTSD Referring Physician: Scotty Court Patient Identification: Jeremy Frank MRN:  [id number] Principal Diagnosis: Amphetamine and psychostimulant-induced psychosis with hallucinations (HCC) Diagnosis:  Principal Problem:   Amphetamine and psychostimulant-induced psychosis with hallucinations (HCC) Active Problems:   Psychosis (HCC)   Posttraumatic stress disorder   Cocaine dependence (HCC)   Marijuana abuse   Amphetamine abuse (HCC)   Total Time spent with patient: 30 minutes  Subjective:   Jeremy Frank is a 35 y.o. male patient admitted with "I am doing better".  HPI: Patient seen chart reviewed.  Patient reports his mood is better.  He has been compliant with Depakote.  He has rested well.  Hygiene is improved.  Patient says his mood is feeling much more stable.  He denies any current suicidal thoughts.  Denies any hallucinations or psychosis.  We have been unable to establish a bed at a [facility] and I have reconsulted with the patient about his situation.  He is now feeling well enough that he feels safe and comfortable with discharge and says he does have a place to stay for the time being.  Past Psychiatric History: Past history of head injury PTSD substance abuse  Risk to Self:   Risk to Others:   Prior Inpatient Therapy:   Prior Outpatient Therapy:    Past Medical History:  Past Medical History:  Diagnosis Date  . Drug dependence (HCC)   . GI bleed   . Hiatal hernia   . Hypertension   . PTSD (post-traumatic stress disorder)     Past Surgical History:  Procedure Laterality Date  . KNEE SURGERY     Family History: No family history on file. Family Psychiatric  History: See previous Social History:  Social History   Substance and Sexual Activity  Alcohol Use Yes     Social History   Substance and Sexual Activity  Drug Use Yes   . Types: Cocaine, Marijuana    Social History   Socioeconomic History  . Marital status: Single    Spouse name: Not on file  . Number of children: Not on file  . Years of education: Not on file  . Highest education level: Not on file  Occupational History  . Not on file  Tobacco Use  . Smoking status: Current Every Day Smoker    Types: Cigarettes  . Smokeless tobacco: Never Used  Substance and Sexual Activity  . Alcohol use: Yes  . Drug use: Yes    Types: Cocaine, Marijuana  . Sexual activity: Not on file  Other Topics Concern  . Not on file  Social History Narrative  . Not on file   Social Determinants of Health   Financial Resource Strain: Not on file  Food Insecurity: Not on file  Transportation Needs: Not on file  Physical Activity: Not on file  Stress: Not on file  Social Connections: Not on file   Additional Social History:    Allergies:   Allergies  Allergen Reactions  . Cheese Diarrhea  . Eggs Or Egg-Derived Products Diarrhea  . Peanut-Containing Drug Products Diarrhea    Labs:  Results for orders placed or performed during the hospital encounter of 03/13/21 (from the past 48 hour(s))  Comprehensive metabolic panel     Status: Abnormal   Collection Time: 03/13/21 10:48 PM  Result Value Ref Range   Sodium 138 135 - 145 mmol/L   Potassium 3.4 (  L) 3.5 - 5.1 mmol/L   Chloride 108 98 - 111 mmol/L   CO2 22 22 - 32 mmol/L   Glucose, Bld 84 70 - 99 mg/dL    Comment: Glucose reference range applies only to samples taken after fasting for at least 8 hours.   BUN 20 6 - 20 mg/dL   Creatinine, Ser 5.73 0.61 - 1.24 mg/dL   Calcium 8.8 (L) 8.9 - 10.3 mg/dL   Total Protein 7.7 6.5 - 8.1 g/dL   Albumin 4.3 3.5 - 5.0 g/dL   AST 30 15 - 41 U/L   ALT 18 0 - 44 U/L   Alkaline Phosphatase 62 38 - 126 U/L   Total Bilirubin 0.5 0.3 - 1.2 mg/dL   GFR, Estimated >22 >02 mL/min    Comment: (NOTE) Calculated using the CKD-EPI Creatinine Equation (2021)    Anion  gap 8 5 - 15    Comment: Performed at [facility], [address] [place], [place] [address]  Ethanol     Status: Abnormal   Collection Time: 03/13/21 10:48 PM  Result Value Ref Range   Alcohol, Ethyl (B) 49 (H) <10 mg/dL    Comment: (NOTE) Lowest detectable limit for serum alcohol is 10 mg/dL.  For medical purposes only. Performed at [facility], [address] [place], [place] [address]   Salicylate level     Status: Abnormal   Collection Time: 03/13/21 10:48 PM  Result Value Ref Range   Salicylate Lvl <7.0 (L) 7.0 - 30.0 mg/dL    Comment: Performed at [facility], [address] [place], [place] [address]  Acetaminophen level     Status: Abnormal   Collection Time: 03/13/21 10:48 PM  Result Value Ref Range   Acetaminophen (Tylenol), Serum <10 (L) 10 - 30 ug/mL    Comment: (NOTE) Therapeutic concentrations vary significantly. A range of 10-30 ug/mL  may be an effective concentration for many patients. However, some  are best treated at concentrations outside of this range. Acetaminophen concentrations >150 ug/mL at 4 hours after ingestion  and >50 ug/mL at 12 hours after ingestion are often associated with  toxic reactions.  Performed at [facility], [address] [place], [place] [address]   cbc     Status: Abnormal   Collection Time: 03/13/21 10:48 PM  Result Value Ref Range   WBC 10.1 4.0 - 10.5 K/uL   RBC 4.36 4.22 - 5.81 MIL/uL   Hemoglobin 13.0 13.0 - 17.0 g/dL   HCT 07.3 (L) 71.0 - 62.6 %   MCV 86.9 80.0 - 100.0 fL   MCH 29.8 26.0 - 34.0 pg   MCHC 34.3 30.0 - 36.0 g/dL   RDW 94.8 54.6 - 27.0 %   Platelets 175 150 - 400 K/uL   nRBC 0.0 0.0 - 0.2 %    Comment: Performed at [facility], [address] [place], [place] [address]  Valproic acid level     Status: Abnormal   Collection Time: 03/13/21 10:48 PM  Result Value Ref Range   Valproic Acid Lvl <10 (L) 50.0 - 100.0 ug/mL    Comment: RESULT  CONFIRMED BY MANUAL DILUTION SKL Performed at [facility], [address] [place], [place] [address]   Urine Drug Screen, Qualitative     Status: Abnormal   Collection Time: 03/13/21 11:28 PM  Result Value Ref Range   Tricyclic, Ur Screen NONE DETECTED NONE DETECTED   Amphetamines, Ur Screen NONE DETECTED NONE DETECTED  MDMA (Ecstasy)Ur Screen NONE DETECTED NONE DETECTED   Cocaine Metabolite,Ur [place] POSITIVE (A) NONE DETECTED   Opiate, Ur Screen NONE DETECTED NONE DETECTED   Phencyclidine (PCP) Ur S NONE DETECTED NONE DETECTED   Cannabinoid 50 Ng, Ur [place] POSITIVE (A) NONE DETECTED   Barbiturates, Ur Screen NONE DETECTED NONE DETECTED   Benzodiazepine, Ur Scrn NONE DETECTED NONE DETECTED   Methadone Scn, Ur NONE DETECTED NONE DETECTED    Comment: (NOTE) Tricyclics + metabolites, urine    Cutoff 1000 ng/mL Amphetamines + metabolites, urine  Cutoff 1000 ng/mL MDMA (Ecstasy), urine              Cutoff 500 ng/mL Cocaine Metabolite, urine          Cutoff 300 ng/mL Opiate + metabolites, urine        Cutoff 300 ng/mL Phencyclidine (PCP), urine         Cutoff 25 ng/mL Cannabinoid, urine                 Cutoff 50 ng/mL Barbiturates + metabolites, urine  Cutoff 200 ng/mL Benzodiazepine, urine              Cutoff 200 ng/mL Methadone, urine                   Cutoff 300 ng/mL  The urine drug screen provides only a preliminary, unconfirmed analytical test result and should not be used for non-medical purposes. Clinical consideration and professional judgment should be applied to any positive drug screen result due to possible interfering substances. A more specific alternate chemical method must be used in order to obtain a confirmed analytical result. Gas chromatography / mass spectrometry (GC/MS) is the preferred confirm atory method. Performed at [facility], [address] [place], [place] [address]   Resp Panel by RT-PCR (Flu A&B, Covid) Nasopharyngeal Swab      Status: None   Collection Time: 03/13/21 11:28 PM   Specimen: Nasopharyngeal Swab; Nasopharyngeal(NP) swabs in vial transport medium  Result Value Ref Range   SARS Coronavirus 2 by RT PCR NEGATIVE NEGATIVE    Comment: (NOTE) SARS-CoV-2 target nucleic acids are NOT DETECTED.  The SARS-CoV-2 RNA is generally detectable in upper respiratory specimens during the acute phase of infection. The lowest concentration of SARS-CoV-2 viral copies this assay can detect is 138 copies/mL. A negative result does not preclude SARS-Cov-2 infection and should not be used as the sole basis for treatment or other patient management decisions. A negative result may occur with  improper specimen collection/handling, submission of specimen other than nasopharyngeal swab, presence of viral mutation(s) within the areas targeted by this assay, and inadequate number of viral copies(<138 copies/mL). A negative result must be combined with clinical observations, patient history, and epidemiological information. The expected result is Negative.  Fact Sheet for Patients:  BloggerCourse.comhttps://www.fda.gov/media/152166/download  Fact Sheet for Healthcare Providers:  SeriousBroker.ithttps://www.fda.gov/media/152162/download  This test is no t yet approved or cleared by the [place] FDA and  has been authorized for detection and/or diagnosis of SARS-CoV-2 by FDA under an Emergency Use Authorization (EUA). This EUA will remain  in effect (meaning this test can be used) for the duration of the COVID-19 declaration under Section 564(b)(1) of the Act, 21 U.S.C.section 360bbb-3(b)(1), unless the authorization is terminated  or revoked sooner.       Influenza A by PCR NEGATIVE NEGATIVE   Influenza B by PCR NEGATIVE NEGATIVE    Comment: (NOTE) The Xpert Xpress SARS-CoV-2/FLU/RSV plus assay is  intended as an aid in the diagnosis of influenza from Nasopharyngeal swab specimens and should not be used as a sole basis for treatment. Nasal  washings and aspirates are unacceptable for Xpert Xpress SARS-CoV-2/FLU/RSV testing.  Fact Sheet for Patients: BloggerCourse.com  Fact Sheet for Healthcare Providers: SeriousBroker.it  This test is not yet approved or cleared by the [place] FDA and has been authorized for detection and/or diagnosis of SARS-CoV-2 by FDA under an Emergency Use Authorization (EUA). This EUA will remain in effect (meaning this test can be used) for the duration of the COVID-19 declaration under Section 564(b)(1) of the Act, 21 U.S.C. section 360bbb-3(b)(1), unless the authorization is terminated or revoked.  Performed at [facility], [address] [place], [place] [address]     Current Facility-Administered Medications  Medication Dose Route Frequency Provider Last Rate Last Admin  . divalproex (DEPAKOTE) DR tablet 500 mg  500 mg Oral Q12H Fredericka Bottcher, Jackquline Denmark, MD   500 mg at 03/15/21 1143  . ibuprofen (ADVIL) tablet 800 mg  800 mg Oral Q8H PRN Eithan Beagle, Jackquline Denmark, MD   800 mg at 03/14/21 1024   Current Outpatient Medications  Medication Sig Dispense Refill  . divalproex (DEPAKOTE) 500 MG DR tablet Take 1 tablet (500 mg total) by mouth every 12 (twelve) hours. 60 tablet 1  . gabapentin (NEURONTIN) 300 MG capsule Take 300 mg by mouth 3 (three) times daily. (Patient not taking: Reported on 03/14/2021)    . omeprazole (PRILOSEC) 40 MG capsule Take 40 mg by mouth daily. (Patient not taking: Reported on 03/14/2021)      Musculoskeletal: Strength & Muscle Tone: within normal limits Gait & Station: normal Patient leans: N/A            Psychiatric Specialty Exam:  Presentation  General Appearance: Bizarre  Eye Contact:Poor  Speech:Clear and Coherent  Speech Volume:Normal  Handedness:Right   Mood and Affect  Mood:Anxious; Hopeless  Affect:Blunt; Depressed; Flat; Inappropriate   Thought Process  Thought  Processes:Coherent  Descriptions of Associations:Intact  Orientation:Full (Time, Place and Person)  Thought Content:Obsessions; Paranoid Ideation; Rumination; Tangential  History of Schizophrenia/Schizoaffective disorder:No  Duration of Psychotic Symptoms:Less than six months  Hallucinations:Hallucinations: Auditory  Ideas of Reference:Paranoia  Suicidal Thoughts:Suicidal Thoughts: Yes, Passive SI Passive Intent and/or Plan: Without Intent  Homicidal Thoughts:Homicidal Thoughts: No   Sensorium  Memory:Immediate Good; Recent Good; Remote Good  Judgment:Fair  Insight:Lacking   Executive Functions  Concentration:Fair  Attention Span:Fair  Recall:Fair  Fund of Knowledge:Fair  Language:Fair   Psychomotor Activity  Psychomotor Activity:Psychomotor Activity: Normal   Assets  Assets:Communication Skills; Housing; Resilience; Social Support   Sleep  Sleep:Sleep: Fair   Physical Exam: Physical Exam Vitals and nursing note reviewed.  Constitutional:      Appearance: Normal appearance.  HENT:     Head: Normocephalic and atraumatic.     Mouth/Throat:     Pharynx: Oropharynx is clear.  Eyes:     Pupils: Pupils are equal, round, and reactive to light.  Cardiovascular:     Rate and Rhythm: Normal rate and regular rhythm.  Pulmonary:     Effort: Pulmonary effort is normal.     Breath sounds: Normal breath sounds.  Abdominal:     General: Abdomen is flat.     Palpations: Abdomen is soft.  Musculoskeletal:        General: Normal range of motion.  Skin:    General: Skin is warm and dry.  Neurological:     General: No focal  deficit present.     Mental Status: He is alert. Mental status is at baseline.  Psychiatric:        Mood and Affect: Mood normal.        Thought Content: Thought content normal.    Review of Systems  Constitutional: Negative.   HENT: Negative.   Eyes: Negative.   Respiratory: Negative.   Cardiovascular: Negative.    Gastrointestinal: Negative.   Musculoskeletal: Negative.   Skin: Negative.   Neurological: Negative.   Psychiatric/Behavioral: Negative.    Blood pressure 123/77, pulse 73, temperature 98.3 F (36.8 C), temperature source Oral, resp. rate 16, height 5\' 11"  (1.803 m), weight 104.3 kg, SpO2 99 %. Body mass index is 32.08 kg/m.  Treatment Plan Summary: Medication management and Plan Patient is no longer meeting commitment criteria.  He is calm enough safe enough and lucid enough to be discharged.  [facility]  I have printed a prescription for his Depakote for now which will be given to him at discharge.  Case reviewed with TTS and emergency room physician.  Discontinue IVC.  Disposition: No evidence of imminent risk to self or others at present.   Patient does not meet criteria for psychiatric inpatient admission. Supportive therapy provided about ongoing stressors.  , MD 03/15/2021 11:51 AM

## 2021-03-15 NOTE — BH Assessment (Signed)
Spoke with [facility] Dois Davenport), informed her the patient has improved and he will be discharging from the ER today.

## 2021-03-15 NOTE — ED Notes (Signed)
Pt given meal tray at this time 

## 2021-03-15 NOTE — BH Assessment (Addendum)
Writer spoke with [facility] Dois Davenport), patient information is still under review with them, for possible bed placement.

## 2021-03-15 NOTE — ED Provider Notes (Signed)
Emergency Medicine Observation Re-evaluation Note  Jeremy Frank is a 35 y.o. male, seen on rounds today.  Pt initially presented to the ED for complaints of Psychiatric Evaluation  Currently, the patient is calm, no acute complaints.  Physical Exam  Blood pressure 123/77, pulse 73, temperature 98.3 F (36.8 C), temperature source Oral, resp. rate 16, height 5\' 11"  (1.803 m), weight 104.3 kg, SpO2 99 %. Physical Exam General: NAD Lungs: CTAB Psych: not agitated  ED Course / MDM  EKG:    I have reviewed the labs performed to date as well as medications administered while in observation.  Recent changes in the last 24 hours include no acute events overnight.  Seen by psychiatry today who recommends discharge home, appears to be amphetamine/stimulant induced symptoms, now sober.  Plan  Current plan is for discharge. Patient is not under full IVC at this time.   , MD 03/15/21 [phone]

## 2021-03-15 NOTE — ED Notes (Signed)
IVC/pending transfer to the [place]

## 2021-03-15 NOTE — ED Notes (Signed)
Breakfast tray served.

## 2021-06-15 DIAGNOSIS — F10929 Alcohol use, unspecified with intoxication, unspecified: Secondary | ICD-10-CM | POA: Insufficient documentation

## 2021-06-15 DIAGNOSIS — F4310 Post-traumatic stress disorder, unspecified: Secondary | ICD-10-CM | POA: Diagnosis not present

## 2021-06-15 DIAGNOSIS — F1420 Cocaine dependence, uncomplicated: Secondary | ICD-10-CM | POA: Insufficient documentation

## 2021-06-15 DIAGNOSIS — F17210 Nicotine dependence, cigarettes, uncomplicated: Secondary | ICD-10-CM | POA: Diagnosis not present

## 2021-06-15 DIAGNOSIS — Y904 Blood alcohol level of 80-99 mg/100 ml: Secondary | ICD-10-CM | POA: Diagnosis not present

## 2021-06-15 DIAGNOSIS — F19959 Other psychoactive substance use, unspecified with psychoactive substance-induced psychotic disorder, unspecified: Secondary | ICD-10-CM | POA: Insufficient documentation

## 2021-06-15 DIAGNOSIS — F29 Unspecified psychosis not due to a substance or known physiological condition: Secondary | ICD-10-CM | POA: Diagnosis not present

## 2021-06-15 DIAGNOSIS — Z91010 Allergy to peanuts: Secondary | ICD-10-CM | POA: Diagnosis not present

## 2021-06-15 DIAGNOSIS — I10 Essential (primary) hypertension: Secondary | ICD-10-CM | POA: Diagnosis not present

## 2021-06-15 DIAGNOSIS — Z20822 Contact with and (suspected) exposure to covid-19: Secondary | ICD-10-CM | POA: Diagnosis not present

## 2021-06-15 DIAGNOSIS — R45851 Suicidal ideations: Secondary | ICD-10-CM | POA: Diagnosis not present

## 2021-06-15 DIAGNOSIS — F333 Major depressive disorder, recurrent, severe with psychotic symptoms: Secondary | ICD-10-CM | POA: Insufficient documentation

## 2021-06-15 DIAGNOSIS — F4312 Post-traumatic stress disorder, chronic: Secondary | ICD-10-CM | POA: Insufficient documentation

## 2021-06-15 DIAGNOSIS — R443 Hallucinations, unspecified: Secondary | ICD-10-CM | POA: Diagnosis present

## 2021-06-16 ENCOUNTER — Other Ambulatory Visit: Payer: Self-pay

## 2021-06-16 ENCOUNTER — Emergency Department
Admission: EM | Admit: 2021-06-16 | Discharge: 2021-06-17 | Disposition: A | Discharge status: Other Acute Inpatient Hospital | Payer: No Typology Code available for payment source | Attending: Emergency Medicine | Admitting: Emergency Medicine

## 2021-06-16 DIAGNOSIS — F4310 Post-traumatic stress disorder, unspecified: Secondary | ICD-10-CM | POA: Diagnosis present

## 2021-06-16 DIAGNOSIS — F1420 Cocaine dependence, uncomplicated: Secondary | ICD-10-CM

## 2021-06-16 DIAGNOSIS — F201 Disorganized schizophrenia: Secondary | ICD-10-CM

## 2021-06-16 DIAGNOSIS — F333 Major depressive disorder, recurrent, severe with psychotic symptoms: Secondary | ICD-10-CM | POA: Diagnosis present

## 2021-06-16 DIAGNOSIS — F19959 Other psychoactive substance use, unspecified with psychoactive substance-induced psychotic disorder, unspecified: Secondary | ICD-10-CM

## 2021-06-16 DIAGNOSIS — F19950 Other psychoactive substance use, unspecified with psychoactive substance-induced psychotic disorder with delusions: Secondary | ICD-10-CM | POA: Insufficient documentation

## 2021-06-16 DIAGNOSIS — F29 Unspecified psychosis not due to a substance or known physiological condition: Secondary | ICD-10-CM | POA: Diagnosis present

## 2021-06-16 DIAGNOSIS — R45851 Suicidal ideations: Secondary | ICD-10-CM

## 2021-06-16 LAB — COMPREHENSIVE METABOLIC PANEL
ALT: 27 U/L (ref 0–44)
AST: 44 U/L — ABNORMAL HIGH (ref 15–41)
Albumin: 4.8 g/dL (ref 3.5–5.0)
Alkaline Phosphatase: 58 U/L (ref 38–126)
Anion gap: 8 (ref 5–15)
BUN: 18 mg/dL (ref 6–20)
CO2: 19 mmol/L — ABNORMAL LOW (ref 22–32)
Calcium: 9 mg/dL (ref 8.9–10.3)
Chloride: 109 mmol/L (ref 98–111)
Creatinine, Ser: 1.41 mg/dL — ABNORMAL HIGH (ref 0.61–1.24)
GFR, Estimated: >60 mL/min (ref >60)
Glucose, Bld: 86 mg/dL (ref 70–99)
Potassium: 4 mmol/L (ref 3.5–5.1)
Sodium: 136 mmol/L (ref 135–145)
Total Bilirubin: 0.5 mg/dL (ref 0.3–1.2)
Total Protein: 8.5 g/dL — ABNORMAL HIGH (ref 6.5–8.1)

## 2021-06-16 LAB — CBC
HCT: 40.9 % (ref 39.0–52.0)
Hemoglobin: 13.5 g/dL (ref 13.0–17.0)
MCH: 29.9 pg (ref 26.0–34.0)
MCHC: 33 g/dL (ref 30.0–36.0)
MCV: 90.7 fL (ref 80.0–100.0)
Platelets: 222 10*3/uL (ref 150–400)
RBC: 4.51 MIL/uL (ref 4.22–5.81)
RDW: 13.1 % (ref 11.5–15.5)
WBC: 9.8 10*3/uL (ref 4.0–10.5)
nRBC: 0.2 % (ref 0.0–0.2)

## 2021-06-16 LAB — SALICYLATE LEVEL: Salicylate Lvl: <7 mg/dL — ABNORMAL LOW (ref 7.0–30.0)

## 2021-06-16 LAB — URINE DRUG SCREEN, QUALITATIVE (ARMC ONLY)
Amphetamines, Ur Screen: NOT DETECTED
Barbiturates, Ur Screen: NOT DETECTED
Benzodiazepine, Ur Scrn: NOT DETECTED
Cannabinoid 50 Ng, Ur ~~LOC~~: POSITIVE — AB
Cocaine Metabolite,Ur ~~LOC~~: POSITIVE — AB
MDMA (Ecstasy)Ur Screen: NOT DETECTED
Methadone Scn, Ur: NOT DETECTED
Opiate, Ur Screen: NOT DETECTED
Phencyclidine (PCP) Ur S: NOT DETECTED
Tricyclic, Ur Screen: NOT DETECTED

## 2021-06-16 LAB — RESP PANEL BY RT-PCR (FLU A&B, COVID) ARPGX2
Influenza A by PCR: NEGATIVE
Influenza B by PCR: NEGATIVE
SARS Coronavirus 2 by RT PCR: NEGATIVE

## 2021-06-16 LAB — ETHANOL: Alcohol, Ethyl (B): 88 mg/dL — ABNORMAL HIGH (ref <10)

## 2021-06-16 LAB — ACETAMINOPHEN LEVEL: Acetaminophen (Tylenol), Serum: <10 ug/mL — ABNORMAL LOW (ref 10–30)

## 2021-06-16 MED ORDER — LOPERAMIDE HCL 2 MG PO CAPS
2.0000 mg | ORAL_CAPSULE | ORAL | Status: DC | PRN
  Filled 2021-06-16: qty 2

## 2021-06-16 MED ORDER — ONDANSETRON 4 MG PO TBDP
4.0000 mg | ORAL_TABLET | Freq: Four times a day (QID) | ORAL | Status: DC | PRN
  Filled 2021-06-16: qty 1

## 2021-06-16 MED ORDER — LORAZEPAM 1 MG PO TABS
1.0000 mg | ORAL_TABLET | Freq: Four times a day (QID) | ORAL | Status: AC
  Administered 2021-06-16 (×3): 1 mg via ORAL
  Filled 2021-06-16 (×3): qty 1

## 2021-06-16 MED ORDER — LORAZEPAM 1 MG PO TABS: 1.0000 mg | ORAL_TABLET | Freq: Every day | ORAL | Status: DC

## 2021-06-16 MED ORDER — RISPERIDONE 1 MG PO TABS
1.0000 mg | ORAL_TABLET | Freq: Every day | ORAL | Status: DC
  Administered 2021-06-16: 1 mg via ORAL
  Filled 2021-06-16: qty 1

## 2021-06-16 MED ORDER — THIAMINE HCL 100 MG/ML IJ SOLN
100.0000 mg | Freq: Once | INTRAMUSCULAR | Status: DC
  Filled 2021-06-16: qty 1

## 2021-06-16 MED ORDER — LORAZEPAM 1 MG PO TABS
1.0000 mg | ORAL_TABLET | Freq: Three times a day (TID) | ORAL | Status: DC
  Administered 2021-06-17: 1 mg via ORAL
  Filled 2021-06-16: qty 1

## 2021-06-16 MED ORDER — LORAZEPAM 1 MG PO TABS: 1.0000 mg | ORAL_TABLET | Freq: Two times a day (BID) | ORAL | Status: DC

## 2021-06-16 MED ORDER — HYDROXYZINE HCL 25 MG PO TABS: 25.0000 mg | ORAL_TABLET | Freq: Four times a day (QID) | ORAL | Status: DC | PRN

## 2021-06-16 MED ORDER — ADULT MULTIVITAMIN W/MINERALS CH
1.0000 | ORAL_TABLET | Freq: Every day | ORAL | Status: DC
  Administered 2021-06-16 – 2021-06-17 (×2): 1 via ORAL
  Filled 2021-06-16 (×2): qty 1

## 2021-06-16 MED ORDER — THIAMINE HCL 100 MG PO TABS
100.0000 mg | ORAL_TABLET | Freq: Every day | ORAL | Status: DC
  Administered 2021-06-16 – 2021-06-17 (×2): 100 mg via ORAL
  Filled 2021-06-16 (×2): qty 1

## 2021-06-16 MED ORDER — LORAZEPAM 1 MG PO TABS: 1.0000 mg | ORAL_TABLET | Freq: Four times a day (QID) | ORAL | Status: DC | PRN

## 2021-06-16 MED ORDER — CHLORDIAZEPOXIDE HCL 25 MG PO CAPS
50.0000 mg | ORAL_CAPSULE | Freq: Three times a day (TID) | ORAL | Status: DC
  Administered 2021-06-16: 50 mg via ORAL
  Filled 2021-06-16: qty 2

## 2021-06-16 MED ORDER — DIVALPROEX SODIUM 500 MG PO DR TAB
500.0000 mg | DELAYED_RELEASE_TABLET | Freq: Two times a day (BID) | ORAL | Status: DC
  Administered 2021-06-16 – 2021-06-17 (×3): 500 mg via ORAL
  Filled 2021-06-16 (×3): qty 1

## 2021-06-16 MED ORDER — THIAMINE HCL 100 MG PO TABS: 100.0000 mg | ORAL_TABLET | Freq: Every day | ORAL | Status: DC

## 2021-06-16 MED ORDER — GABAPENTIN 100 MG PO CAPS
100.0000 mg | ORAL_CAPSULE | Freq: Three times a day (TID) | ORAL | Status: DC
  Administered 2021-06-16 – 2021-06-17 (×4): 100 mg via ORAL
  Filled 2021-06-16 (×4): qty 1

## 2021-06-16 NOTE — ED Notes (Signed)
Hourly rounding reveals patient in room. No complaints, stable, in no acute distress. Q15 minute rounds and monitoring via Security Cameras to continue. 

## 2021-06-16 NOTE — ED Notes (Signed)
Patient transferred from ED to [facility] room 5 after screening for contraband. Report received from [facility], RN including Situation, Background, Assessment and Recommendations. Pt oriented to unit including Q15 minute rounds as well as the security cameras for their protection. Patient is alert and oriented, warm and dry in no acute distress. Patient expresses SI, HI, and AVH. Pt. Encouraged to let this nurse know if needs arise.

## 2021-06-16 NOTE — ED Provider Notes (Signed)
[facility] Emergency Department Provider Note  ____________________________________________  Time seen: Approximately 2:33 AM  I have reviewed the triage vital signs and the nursing notes.   HISTORY  Chief Complaint Suicidal   HPI Jeremy Frank is a 35 y.o. male with history of polysubstance abuse, PTSD, hypertension, psychosis who presents for evaluation of suicidal thoughts and hallucinations.  Patient reports that he stopped taking his Depakote several months ago.  Has been self-medicating with cocaine, marijuana and alcohol.  He drinks on a daily basis.  No history of complicated withdrawals.  Last drink was at 6 PM.  Patient reports that he has been having visual hallucinations of shadows and people.  He reports that he has had these hallucinations intermittently for several years.  Patient reports that he feels very depressed and was thinking about walking in front of a car at [employer].  Patient is here voluntarily asking for help.  Denies any medical complaints.   Past Medical History:  Diagnosis Date   Drug dependence (HCC)    GI bleed    Hiatal hernia    Hypertension    PTSD (post-traumatic stress disorder)     Patient Active Problem List   Diagnosis Date Noted   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   Hypertension 10/19/2020   Psychosis (HCC) 06/13/2015   Posttraumatic stress disorder 06/13/2015   Traumatic brain injury (HCC) 06/13/2015   Cocaine dependence (HCC) 06/13/2015   Marijuana abuse 06/13/2015    Past Surgical History:  Procedure Laterality Date   KNEE SURGERY      Prior to Admission medications   Medication Sig Start Date End Date Taking? Authorizing Provider  divalproex (DEPAKOTE) 500 MG DR tablet Take 1 tablet (500 mg total) by mouth every 12 (twelve) hours. 03/15/21   Clapacs, Jackquline Denmark, MD  gabapentin (NEURONTIN) 300 MG capsule Take  300 mg by mouth 3 (three) times daily. Patient not taking: Reported on 03/14/2021    [provider]  omeprazole (PRILOSEC) 40 MG capsule Take 40 mg by mouth daily. Patient not taking: Reported on 03/14/2021 04/18/20   [provider]    Allergies Cheese, Eggs or egg-derived products, and Peanut-containing drug products  No family history on file.  Social History Social History   Tobacco Use   Smoking status: Every Day    Pack years: 0.00    Types: Cigarettes   Smokeless tobacco: Never  Substance Use Topics   Alcohol use: Yes   Drug use: Yes    Types: Cocaine, Marijuana    Review of Systems  Constitutional: Negative for fever. Eyes: Negative for visual changes. ENT: Negative for sore throat. Neck: No neck pain  Cardiovascular: Negative for chest pain. Respiratory: Negative for shortness of breath. Gastrointestinal: Negative for abdominal pain, vomiting or diarrhea. Genitourinary: Negative for dysuria. Musculoskeletal: Negative for back pain. Skin: Negative for rash. Neurological: Negative for headaches, weakness or numbness. Psych: + depression, SI and hallucinations. No HI  ____________________________________________   PHYSICAL EXAM:  VITAL SIGNS: ED Triage Vitals  Enc Vitals Group     BP 06/16/21 0017 (!) 148/102     Pulse Rate 06/16/21 0017 64     Resp 06/16/21 0017 16     Temp 06/16/21 0017 98.4 F (36.9 C)     Temp src --      SpO2 06/16/21 0017 100 %     Weight 06/16/21 0018 240 lb (108.9 kg)  Height 06/16/21 0018 5\' 11"  (1.803 m)     Head Circumference --      Peak Flow --      Pain Score 06/16/21 0018 4     Pain Loc --      Pain Edu? --      Excl. in GC? --     Constitutional: Alert and oriented. Well appearing and in no apparent distress. HEENT:      Head: Normocephalic and atraumatic.         Eyes: Conjunctivae are normal. Sclera is non-icteric.       Mouth/Throat: Mucous membranes are moist.       Neck: Supple with no  signs of meningismus. Cardiovascular: Regular rate and rhythm.  Respiratory: Normal respiratory effort.  Gastrointestinal: Soft, non tender, and non distended. Musculoskeletal: No edema, cyanosis, or erythema of extremities. Neurologic: Normal speech and language. Face is symmetric. Moving all extremities. No gross focal neurologic deficits are appreciated. Skin: Skin is warm, dry and intact. No rash noted. Psychiatric: Mood and affect are normal. Speech and behavior are normal.  ____________________________________________   LABS (all labs ordered are listed, but only abnormal results are displayed)  Labs Reviewed  COMPREHENSIVE METABOLIC PANEL - Abnormal; Notable for the following components:      Result Value   CO2 19 (*)    Creatinine, Ser 1.41 (*)    Total Protein 8.5 (*)    AST 44 (*)    All other components within normal limits  ETHANOL - Abnormal; Notable for the following components:   Alcohol, Ethyl (B) 88 (*)    All other components within normal limits  SALICYLATE LEVEL - Abnormal; Notable for the following components:   Salicylate Lvl <7.0 (*)    All other components within normal limits  ACETAMINOPHEN LEVEL - Abnormal; Notable for the following components:   Acetaminophen (Tylenol), Serum <10 (*)    All other components within normal limits  URINE DRUG SCREEN, QUALITATIVE ([facility] ONLY) - Abnormal; Notable for the following components:   Cocaine Metabolite,Ur [place] POSITIVE (*)    Cannabinoid 50 Ng, Ur [place] POSITIVE (*)    All other components within normal limits  RESP PANEL BY RT-PCR (FLU A&B, COVID) ARPGX2  CBC   ____________________________________________  EKG  none  ____________________________________________  RADIOLOGY  none  ____________________________________________   PROCEDURES  Procedure(s) performed: None Procedures Critical Care performed:  None ____________________________________________   INITIAL IMPRESSION / ASSESSMENT AND PLAN / ED  COURSE  34 y.o. male with history of polysubstance abuse, PTSD, hypertension, psychosis who presents for evaluation of suicidal thoughts and hallucinations.  Patient is here voluntarily asking for help.  We will place patient on CIWA protocol.  Consult psychiatry.  Labs for medical clearance with no significant abnormalities at this time other than UDS positive for cocaine and cannabinoids and alcohol level of 88.  We will keep patient voluntary at this time.   The patient has been placed in psychiatric observation due to the need to provide a safe environment for the patient while obtaining psychiatric consultation and evaluation, as well as ongoing medical and medication management to treat the patient's condition.  The patient has not been placed under full IVC at this time.    Please note:  Patient was evaluated in Emergency Department today for the symptoms described in the history of present illness. Patient was evaluated in the context of the global COVID-19 pandemic, which necessitated consideration that the patient might be at risk for infection with  the SARS-CoV-2 virus that causes COVID-19. Institutional protocols and algorithms that pertain to the evaluation of patients at risk for COVID-19 are in a state of rapid change based on information released by regulatory bodies including the CDC and federal and state organizations. These policies and algorithms were followed during the patient's care in the ED.  Some ED evaluations and interventions may be delayed as a result of limited staffing during the pandemic.  ____________________________________________   FINAL CLINICAL IMPRESSION(S) / ED DIAGNOSES   Final diagnoses:  Drug-induced intensive care psychosis (HCC)  Severe episode of recurrent major depressive disorder, with psychotic features (HCC)      NEW MEDICATIONS STARTED DURING THIS VISIT:  ED Discharge Orders     None        Note:  This document was prepared using  Dragon voice recognition software and may include unintentional dictation errors.     Don Perking, [place], MD 06/16/21 [phone]

## 2021-06-16 NOTE — ED Notes (Signed)
Hourly rounding performed, patient currently asleep in room. Patient has no complaints at this time. Q15 minute rounds and monitoring via Security Cameras to continue. 

## 2021-06-16 NOTE — BH Assessment (Addendum)
Referral information for Psychiatric Hospitalization faxed to;  When contacting speak to the operator and ask for the AOD for [facility] [phone]) Currently on diversion  [facility] [phone]) No answer  [place] [phone]) No answer  [place]   [phone]) Currently on diversion, will potentially have discharges on Tuesday 06/18/21

## 2021-06-16 NOTE — ED Provider Notes (Signed)
Emergency Medicine Observation Re-evaluation Note  Jeremy Frank is a 35 y.o. male, seen on rounds today.  Pt initially presented to the ED for complaints of Suicidal Currently, the patient is resting comfortably.  Physical Exam  BP (!) 148/102   Pulse 64   Temp 98.4 F (36.9 C)   Resp 16   Ht 5\' 11"  (1.803 m)   Wt 108.9 kg   SpO2 100%   BMI 33.47 kg/m  Physical Exam Constitutional:      Appearance: He is not ill-appearing or toxic-appearing.  HENT:     Head: Atraumatic.  Cardiovascular:     Comments: Well perfused Pulmonary:     Effort: Pulmonary effort is normal.  Abdominal:     General: There is no distension.  Musculoskeletal:        General: No deformity.  Skin:    Findings: No rash.  Neurological:     General: No focal deficit present.     Cranial Nerves: No cranial nerve deficit.     ED Course / MDM  EKG:   I have reviewed the labs performed to date as well as medications administered while in observation.  Recent changes in the last 24 hours include patient moved back to the BHU.  Plan  Current plan is for AM reassessment by our psychiatric team. Patient is not under full IVC at this time.   , MD 06/16/21 [phone]

## 2021-06-16 NOTE — ED Notes (Signed)
Hourly rounding reveals patient in room. No complaints, stable, in no acute distress. Q15 minute rounds and monitoring via Rover and Officer to continue.   

## 2021-06-16 NOTE — ED Notes (Signed)
Pt. Transferred from Triage to room [facility] after dressing out and screening for contraband.Pt. Oriented to [employer] including Q15 minute rounds as well as Psychologist, counselling for their protection. Patient is alert and oriented, warm and dry in no acute distress. Patient reported SI, HI, and AVH. Pt. Encouraged to let me know if needs arise.

## 2021-06-16 NOTE — Consult Note (Addendum)
[facility] Face-to-Face Psychiatry Consult   Reason for Consult:  Psychiatric Referring Physician:  Dr. Don Perking Patient Identification: Jeremy Frank MRN:  [id number] Principal Diagnosis: Major depressive disorder, recurrent episode, severe, with psychosis (HCC) Diagnosis:  Principal Problem:   Major depressive disorder, recurrent episode, severe, with psychosis (HCC) Active Problems:   Psychosis (HCC)   Posttraumatic stress disorder   Cocaine dependence (HCC)   Suicidal ideations   Total Time spent with patient: 15 minutes  Subjective:   Jeremy Frank is a 35 y.o. male patient admitted to [facility] with thoughts of killing himself [place] [address].  Per triage note, pt states is suicidal. Pt states he is also hallucinating. Pt states he was thinking about [place] [address] tonight.   Client continues to have suicidal ideations and reports he is self-medicating with alcohol, cocaine, and cannabis.  He does feel Depakote helps, this was restarted.  Not currently psychotic or responding to internal stimuli.  Continues to [facility].  [facility] in [place] and [place] are on diversion, still seeking Jeremy Frank and Jeremy Frank responses.  HPI per Lerry Liner, NP:  [name], 35 y.o., male patient with history of PTSD, Psychosis, MDD and substance abuse presented to [facility] voluntarily for suicidal thoughts with a plan to walk into traffic .  Patient seen by TTS and this provider; chart reviewed and consulted with Dr. Don Perking on 06/16/21.  On evaluation Jeremy Frank reports that he has been hearing voices and that they are so bothersome that he feels the only way to make them stop is to kill himself. Patient states that he was here in march and was prescribed Depakote was unable to fill the prescription at the [place] because he did not have transportation the [facility].  He says he feels like Depakote helped and would like to be put back on  it. Patient expresses that he self medicates with drugs and alcohol.  Currently his UDS is positive for marijuana, cocaine, and BAL 88. Patient says he often feels like "blowing his brains out" to make the voice stop. He denies ownwership of a gun, but says he can get access to one.  He is currently homeless, he refers to it as "camping". He says he can live with his mother but prefers to "camp".   Per TTS, patient reports that he has been discharged from the [employer] since 2008 and since then has not had any Outpatient treatment for it, patient reports having difficulty finding transportation to the [place] and reports having no money to pay for his medications. Patient reports that he has been self medicating due to this. Patient reports using "marijuana mostly and alcohol." Patient UDS is positive for Cocaine and Cannabinoids. Patient BAL is 88. Patient reports having AH "they are commends that won't stop it's like military commands." Patient reports experiencing his AH when he is not using as well.  During evaluation Jeremy Frank is sitting up on bed actively scratching his foot.  He is alert/oriented x 4; calm/cooperative; and mood congruent with affect.  Patient is speaking in a clear tone at moderate volume, and normal pace; with good eye contact.  His thought process is coherent and relevant; There is no indication that he is currently responding to internal/external stimuli or experiencing delusional thought content.  Patient endorses suicidal with a plan. He endorses psychosis, and paranoia.     Recommendations:  Inpatient psychiatric hospitalization when  medically cleared for medication management and mood stabilization.    Past Psychiatric History: PTSD, Poly substance abuse  Risk to Self:   Risk to Others:   Prior Inpatient Therapy:   Prior Outpatient Therapy:    Past Medical History:  Past Medical History:  Diagnosis Date   Drug dependence (HCC)    GI bleed    Hiatal hernia     Hypertension    PTSD (post-traumatic stress disorder)     Past Surgical History:  Procedure Laterality Date   KNEE SURGERY     Family History: No family history on file. Family Psychiatric  History: unknown Social History:  Social History   Substance and Sexual Activity  Alcohol Use Yes     Social History   Substance and Sexual Activity  Drug Use Yes   Types: Cocaine, Marijuana    Social History   Socioeconomic History   Marital status: Single    Spouse name: Not on file   Number of children: Not on file   Years of education: Not on file   Highest education level: Not on file  Occupational History   Not on file  Tobacco Use   Smoking status: Every Day    Pack years: 0.00    Types: Cigarettes   Smokeless tobacco: Never  Substance and Sexual Activity   Alcohol use: Yes   Drug use: Yes    Types: Cocaine, Marijuana   Sexual activity: Not on file  Other Topics Concern   Not on file  Social History Narrative   Not on file   Social Determinants of Health   Financial Resource Strain: Not on file  Food Insecurity: Not on file  Transportation Needs: Not on file  Physical Activity: Not on file  Stress: Not on file  Social Connections: Not on file   Additional Social History:    Allergies:   Allergies  Allergen Reactions   Cheese Diarrhea   Eggs Or Egg-Derived Products Diarrhea   Peanut-Containing Drug Products Diarrhea    Labs:  Results for orders placed or performed during the hospital encounter of 06/16/21 (from the past 48 hour(s))  Comprehensive metabolic panel     Status: Abnormal   Collection Time: 06/16/21 12:25 AM  Result Value Ref Range   Sodium 136 135 - 145 mmol/L   Potassium 4.0 3.5 - 5.1 mmol/L   Chloride 109 98 - 111 mmol/L   CO2 19 (L) 22 - 32 mmol/L   Glucose, Bld 86 70 - 99 mg/dL    Comment: Glucose reference range applies only to samples taken after fasting for at least 8 hours.   BUN 18 6 - 20 mg/dL   Creatinine, Ser 8.291.41 (H) 0.61  - 1.24 mg/dL   Calcium 9.0 8.9 - 56.210.3 mg/dL   Total Protein 8.5 (H) 6.5 - 8.1 g/dL   Albumin 4.8 3.5 - 5.0 g/dL   AST 44 (H) 15 - 41 U/L   ALT 27 0 - 44 U/L   Alkaline Phosphatase 58 38 - 126 U/L   Total Bilirubin 0.5 0.3 - 1.2 mg/dL   GFR, Estimated >13>60 >08>60 mL/min    Comment: (NOTE) Calculated using the CKD-EPI Creatinine Equation (2021)    Anion gap 8 5 - 15    Comment: Performed at [facility], [address] [place], [place] [address]  Ethanol     Status: Abnormal   Collection Time: 06/16/21 12:25 AM  Result Value Ref Range   Alcohol, Ethyl (B) 88 (H) <  10 mg/dL    Comment: (NOTE) Lowest detectable limit for serum alcohol is 10 mg/dL.  For medical purposes only. Performed at [facility], [address] [place], [place] [address]   Salicylate level     Status: Abnormal   Collection Time: 06/16/21 12:25 AM  Result Value Ref Range   Salicylate Lvl <7.0 (L) 7.0 - 30.0 mg/dL    Comment: Performed at [facility], [address] [place], [place] [address]  Acetaminophen level     Status: Abnormal   Collection Time: 06/16/21 12:25 AM  Result Value Ref Range   Acetaminophen (Tylenol), Serum <10 (L) 10 - 30 ug/mL    Comment: (NOTE) Therapeutic concentrations vary significantly. A range of 10-30 ug/mL  may be an effective concentration for many patients. However, some  are best treated at concentrations outside of this range. Acetaminophen concentrations >150 ug/mL at 4 hours after ingestion  and >50 ug/mL at 12 hours after ingestion are often associated with  toxic reactions.  Performed at [facility], [address] [place], [place] [address]   cbc     Status: None   Collection Time: 06/16/21 12:25 AM  Result Value Ref Range   WBC 9.8 4.0 - 10.5 K/uL   RBC 4.51 4.22 - 5.81 MIL/uL   Hemoglobin 13.5 13.0 - 17.0 g/dL   HCT 32.2 02.5 - 42.7 %   MCV 90.7 80.0 - 100.0 fL   MCH 29.9 26.0 - 34.0 pg   MCHC 33.0 30.0 - 36.0 g/dL    RDW 06.2 37.6 - 28.3 %   Platelets 222 150 - 400 K/uL   nRBC 0.2 0.0 - 0.2 %    Comment: Performed at [facility], [address], [place], [place] [address]  Urine Drug Screen, Qualitative     Status: Abnormal   Collection Time: 06/16/21 12:25 AM  Result Value Ref Range   Tricyclic, Ur Screen NONE DETECTED NONE DETECTED   Amphetamines, Ur Screen NONE DETECTED NONE DETECTED   MDMA (Ecstasy)Ur Screen NONE DETECTED NONE DETECTED   Cocaine Metabolite,Ur [place] POSITIVE (A) NONE DETECTED   Opiate, Ur Screen NONE DETECTED NONE DETECTED   Phencyclidine (PCP) Ur S NONE DETECTED NONE DETECTED   Cannabinoid 50 Ng, Ur [place] POSITIVE (A) NONE DETECTED   Barbiturates, Ur Screen NONE DETECTED NONE DETECTED   Benzodiazepine, Ur Scrn NONE DETECTED NONE DETECTED   Methadone Scn, Ur NONE DETECTED NONE DETECTED    Comment: (NOTE) Tricyclics + metabolites, urine    Cutoff 1000 ng/mL Amphetamines + metabolites, urine  Cutoff 1000 ng/mL MDMA (Ecstasy), urine              Cutoff 500 ng/mL Cocaine Metabolite, urine          Cutoff 300 ng/mL Opiate + metabolites, urine        Cutoff 300 ng/mL Phencyclidine (PCP), urine         Cutoff 25 ng/mL Cannabinoid, urine                 Cutoff 50 ng/mL Barbiturates + metabolites, urine  Cutoff 200 ng/mL Benzodiazepine, urine              Cutoff 200 ng/mL Methadone, urine                   Cutoff 300 ng/mL  The urine drug screen provides only a preliminary, unconfirmed analytical test result and should not be used for non-medical purposes. Clinical consideration and professional  judgment should be applied to any positive drug screen result due to possible interfering substances. A more specific alternate chemical method must be used in order to obtain a confirmed analytical result. Gas chromatography / mass spectrometry (GC/MS) is the preferred confirm atory method. Performed at [facility], [address] [place], [place] [address]   Resp Panel  by RT-PCR (Flu A&B, Covid) Nasopharyngeal Swab     Status: None   Collection Time: 06/16/21  3:00 AM   Specimen: Nasopharyngeal Swab; Nasopharyngeal(NP) swabs in vial transport medium  Result Value Ref Range   SARS Coronavirus 2 by RT PCR NEGATIVE NEGATIVE    Comment: (NOTE) SARS-CoV-2 target nucleic acids are NOT DETECTED.  The SARS-CoV-2 RNA is generally detectable in upper respiratory specimens during the acute phase of infection. The lowest concentration of SARS-CoV-2 viral copies this assay can detect is 138 copies/mL. A negative result does not preclude SARS-Cov-2 infection and should not be used as the sole basis for treatment or other patient management decisions. A negative result may occur with  improper specimen collection/handling, submission of specimen other than nasopharyngeal swab, presence of viral mutation(s) within the areas targeted by this assay, and inadequate number of viral copies(<138 copies/mL). A negative result must be combined with clinical observations, patient history, and epidemiological information. The expected result is Negative.  Fact Sheet for Patients:  BloggerCourse.com  Fact Sheet for Healthcare Providers:  SeriousBroker.it  This test is no t yet approved or cleared by the [place] FDA and  has been authorized for detection and/or diagnosis of SARS-CoV-2 by FDA under an Emergency Use Authorization (EUA). This EUA will remain  in effect (meaning this test can be used) for the duration of the COVID-19 declaration under Section 564(b)(1) of the Act, 21 U.S.C.section 360bbb-3(b)(1), unless the authorization is terminated  or revoked sooner.       Influenza A by PCR NEGATIVE NEGATIVE   Influenza B by PCR NEGATIVE NEGATIVE    Comment: (NOTE) The Xpert Xpress SARS-CoV-2/FLU/RSV plus assay is intended as an aid in the diagnosis of influenza from Nasopharyngeal swab specimens and should not  be used as a sole basis for treatment. Nasal washings and aspirates are unacceptable for Xpert Xpress SARS-CoV-2/FLU/RSV testing.  Fact Sheet for Patients: BloggerCourse.com  Fact Sheet for Healthcare Providers: SeriousBroker.it  This test is not yet approved or cleared by the [place] FDA and has been authorized for detection and/or diagnosis of SARS-CoV-2 by FDA under an Emergency Use Authorization (EUA). This EUA will remain in effect (meaning this test can be used) for the duration of the COVID-19 declaration under Section 564(b)(1) of the Act, 21 U.S.C. section 360bbb-3(b)(1), unless the authorization is terminated or revoked.  Performed at [facility], [address] [place], [place] [address]     Current Facility-Administered Medications  Medication Dose Route Frequency Provider Last Rate Last Admin   chlordiazePOXIDE (LIBRIUM) capsule 50 mg  50 mg Oral TID Don Perking, [place], MD   50 mg at 06/16/21 0949   divalproex (DEPAKOTE) DR tablet 500 mg  500 mg Oral Q12H Dixon, Rashaun M, NP   500 mg at 06/16/21 0949   risperiDONE (RISPERDAL) tablet 1 mg  1 mg Oral QHS Jeremy Lesch, NP       Current Outpatient Medications  Medication Sig Dispense Refill   divalproex (DEPAKOTE) 500 MG DR tablet Take 1 tablet (500 mg total) by mouth every 12 (twelve) hours. (Patient not taking: No sig reported) 60 tablet 1  gabapentin (NEURONTIN) 300 MG capsule Take 300 mg by mouth 3 (three) times daily. (Patient not taking: No sig reported)     omeprazole (PRILOSEC) 40 MG capsule Take 40 mg by mouth daily. (Patient not taking: No sig reported)      Musculoskeletal: Strength & Muscle Tone: within normal limits Gait & Station: normal Patient leans: N/A  Psychiatric Specialty Exam:  Presentation  General Appearance: Appropriate for Environment; Casual  Eye Contact:Fair  Speech:Clear and Coherent  Speech  Volume:Normal  Handedness:Right   Mood and Affect  Mood:Anxious; Dysphoric; Hopeless  Affect:Depressed   Thought Process  Thought Processes:Coherent  Descriptions of Associations:Intact  Orientation:Full (Time, Place and Person)  Thought Content:Logical; WDL  History of Schizophrenia/Schizoaffective disorder:No  Duration of Psychotic Symptoms:Greater than six months  Hallucinations:Hallucinations: Auditory; Command  Ideas of Reference:Paranoia  Suicidal Thoughts:Suicidal Thoughts: Yes, Active SI Active Intent and/or Plan: With Intent; With Means to Carry Out; With Access to Means  Homicidal Thoughts:Homicidal Thoughts: No   Sensorium  Memory:Immediate Good; Recent Good  Judgment:Impaired  Insight:Fair   Executive Functions  Concentration:Fair  Attention Span:Fair  Recall:Fair  Fund of Knowledge:Fair  Language:Fair   Psychomotor Activity  Psychomotor Activity:Psychomotor Activity: Normal   Assets  Assets:Communication Skills; Desire for Improvement   Sleep  Sleep:Sleep: Poor   Physical Exam: Physical Exam Vitals and nursing note reviewed.  HENT:     Head: Normocephalic and atraumatic.     Mouth/Throat:     Mouth: Mucous membranes are dry.  Eyes:     Pupils: Pupils are equal, round, and reactive to light.  Pulmonary:     Effort: Pulmonary effort is normal.  Musculoskeletal:        General: Normal range of motion.     Cervical back: Normal range of motion.  Skin:    General: Skin is warm and dry.  Neurological:     Mental Status: He is alert and oriented to person, place, and time.  Psychiatric:        Attention and Perception: Attention and perception normal.        Mood and Affect: Mood is depressed. Affect is flat.        Speech: Speech normal.        Behavior: Behavior normal. Behavior is cooperative.        Thought Content: Thought content includes suicidal ideation. Thought content includes suicidal plan.        Cognition  and Memory: Cognition and memory normal.        Judgment: Judgment is impulsive and inappropriate.   Review of Systems  Psychiatric/Behavioral:  Positive for depression, hallucinations, substance abuse and suicidal ideas. Negative for memory loss. The patient has insomnia. The patient is not nervous/anxious.   All other systems reviewed and are negative. Blood pressure (!) 141/91, pulse 72, temperature 98.4 F (36.9 C), resp. rate 20, height 5\' 11"  (1.803 m), weight 108.9 kg, SpO2 99 %. Body mass index is 33.47 kg/m.  Treatment Plan Summary: Major depressive disorder, recurrent, with psychosis: -Continue Risperdal 1 mg at bedtime  Alcohol abuse: -Ativan detox protocol  PTSD: -Continue Depakote 500 mg BID  Anxiety: -Continue gabapentin 100 mg TID  Disposition: Admit to inpatient psychiatric unit at a [facility], NP 06/16/2021 10:04 AM

## 2021-06-16 NOTE — ED Notes (Addendum)
PT PERSONAL ITEMS INCLUDE -1 PAIR OF BLACK SHOES -1 WALLET -GREY SHORTS -BLUE & ORANGE SWEATPANTS  -T-SHIRT -GREY BOOKBAG

## 2021-06-16 NOTE — ED Notes (Signed)
Report to include situation, background, assessment and recommendations from Gaffer. Patient sleeping, respirations regular and unlabored. Q15 minute rounds and security camera observation to continue.

## 2021-06-16 NOTE — ED Triage Notes (Signed)
Pt states is suicidal. Pt states he is also hallucinating. Pt states he was thinking about walking [place] [address] [place].

## 2021-06-16 NOTE — ED Notes (Signed)
Pt given shower supplies and noted to be in shower

## 2021-06-16 NOTE — Consult Note (Addendum)
[facility] Face-to-Face Psychiatry Consult   Reason for Consult:  Psychiatric Referring Physician:  Dr. Don Perking Patient Identification: Jeremy Frank MRN:  [id number] Principal Diagnosis: MDD (major depressive disorder), recurrent episode (HCC) Diagnosis:  Principal Problem:   MDD (major depressive disorder), recurrent episode (HCC) Active Problems:   Psychosis (HCC)   Posttraumatic stress disorder   Cocaine dependence (HCC)   Suicidal ideations   Total Time spent with patient: 1 hour  Subjective:   Jeremy Frank is a 35 y.o. male patient admitted to [facility] with thoughts of killing himself [place].  Per triage note, pt states is suicidal. Pt states he is also hallucinating. Pt states he was thinking about walking [place] front [place] tonight.   HPI:  Jeremy Frank, 35 y.o., male patient with history of PTSD, Psychosis, MDD and substance abuse presented to [facility] voluntarily for suicidal thoughts with a plan to walk into traffic .  Patient seen by TTS and this provider; chart reviewed and consulted with Dr. Don Perking on 06/16/21.  On evaluation Jeremy Frank reports that he has been hearing voices and that they are so bothersome that he feels the only way to make them stop is to kill himself. Patient states that he was here in march and was prescribed Depakote was unable to fill the prescription at the [place] because he did not have transportation the [facility]  He says he feels like Depakote helped and would like to be put back on it. Patient expresses that he self medicates with drugs and alcohol.  Currently his UDS is positive for marijuana, cocaine, and BAL 88. Patient says he often feels like "blowing his brains out" to make the voice stop. He denies ownwership of a gun, but says he can get access to one.  He is currently homeless, he refers to it as "camping". He says he can live with his mother but prefers to "camp".   Per TTS, patient reports that he has been  discharged from the [employer] since 2008 and since then has not had any Outpatient treatment for it, patient reports having difficulty finding transportation to the [place] and reports having no money to pay for his medications. Patient reports that he has been self medicating due to this. Patient reports using "marijuana mostly and alcohol." Patient UDS is positive for Cocaine and Cannabinoids. Patient BAL is 88. Patient reports having AH "they are commends that won't stop it's like military commands." Patient reports experiencing his AH when he is not using as well.  During evaluation Jeremy Frank is sitting up on bed actively scratching his foot.  He is alert/oriented x 4; calm/cooperative; and mood congruent with affect.  Patient is speaking in a clear tone at moderate volume, and normal pace; with good eye contact.  His thought process is coherent and relevant; There is no indication that he is currently responding to internal/external stimuli Jeremy experiencing delusional thought content.  Patient endorses suicidal with a plan. He endorses psychosis, and paranoia.     Recommendations:  Inpatient psychiatric hospitalization when medically cleared for medication management and mood stabilization.    Past Psychiatric History: PTSD, Poly substance abuse  Risk to Self:   Risk to Others:   Prior Inpatient Therapy:   Prior Outpatient Therapy:    Past Medical History:  Past Medical History:  Diagnosis Date   Drug dependence (HCC)    GI bleed    Hiatal hernia  Hypertension    PTSD (post-traumatic stress disorder)     Past Surgical History:  Procedure Laterality Date   KNEE SURGERY     Family History: No family history on file. Family Psychiatric  History: unknown Social History:  Social History   Substance and Sexual Activity  Alcohol Use Yes     Social History   Substance and Sexual Activity  Drug Use Yes   Types: Cocaine, Marijuana    Social History   Socioeconomic History    Marital status: Single    Spouse name: Not on file   Number of children: Not on file   Years of education: Not on file   Highest education level: Not on file  Occupational History   Not on file  Tobacco Use   Smoking status: Every Day    Pack years: 0.00    Types: Cigarettes   Smokeless tobacco: Never  Substance and Sexual Activity   Alcohol use: Yes   Drug use: Yes    Types: Cocaine, Marijuana   Sexual activity: Not on file  Other Topics Concern   Not on file  Social History Narrative   Not on file   Social Determinants of Health   Financial Resource Strain: Not on file  Food Insecurity: Not on file  Transportation Needs: Not on file  Physical Activity: Not on file  Stress: Not on file  Social Connections: Not on file   Additional Social History:    Allergies:   Allergies  Allergen Reactions   Cheese Diarrhea   Eggs Jeremy Egg-Derived Products Diarrhea   Peanut-Containing Drug Products Diarrhea    Labs:  Results for orders placed Jeremy performed during the hospital encounter of 06/16/21 (from the past 48 hour(s))  Comprehensive metabolic panel     Status: Abnormal   Collection Time: 06/16/21 12:25 AM  Result Value Ref Range   Sodium 136 135 - 145 mmol/L   Potassium 4.0 3.5 - 5.1 mmol/L   Chloride 109 98 - 111 mmol/L   CO2 19 (L) 22 - 32 mmol/L   Glucose, Bld 86 70 - 99 mg/dL    Comment: Glucose reference range applies only to samples taken after fasting for at least 8 hours.   BUN 18 6 - 20 mg/dL   Creatinine, Ser 1.611.41 (H) 0.61 - 1.24 mg/dL   Calcium 9.0 8.9 - 09.610.3 mg/dL   Total Protein 8.5 (H) 6.5 - 8.1 g/dL   Albumin 4.8 3.5 - 5.0 g/dL   AST 44 (H) 15 - 41 U/L   ALT 27 0 - 44 U/L   Alkaline Phosphatase 58 38 - 126 U/L   Total Bilirubin 0.5 0.3 - 1.2 mg/dL   GFR, Estimated >04>60 >54>60 mL/min    Comment: (NOTE) Calculated using the CKD-EPI Creatinine Equation (2021)    Anion gap 8 5 - 15    Comment: Performed at [facility], [address]  [place], [place] [address]  Ethanol     Status: Abnormal   Collection Time: 06/16/21 12:25 AM  Result Value Ref Range   Alcohol, Ethyl (B) 88 (H) <10 mg/dL    Comment: (NOTE) Lowest detectable limit for serum alcohol is 10 mg/dL.  For medical purposes only. Performed at [facility], [address] [place], [place] [address]   Salicylate level     Status: Abnormal   Collection Time: 06/16/21 12:25 AM  Result Value Ref Range   Salicylate Lvl <7.0 (L) 7.0 - 30.0 mg/dL  Comment: Performed at [facility], [address] [place], [place] [address]  Acetaminophen level     Status: Abnormal   Collection Time: 06/16/21 12:25 AM  Result Value Ref Range   Acetaminophen (Tylenol), Serum <10 (L) 10 - 30 ug/mL    Comment: (NOTE) Therapeutic concentrations vary significantly. A range of 10-30 ug/mL  may be an effective concentration for many patients. However, some  are best treated at concentrations outside of this range. Acetaminophen concentrations >150 ug/mL at 4 hours after ingestion  and >50 ug/mL at 12 hours after ingestion are often associated with  toxic reactions.  Performed at [facility], [address] [place], [place] [address]   cbc     Status: None   Collection Time: 06/16/21 12:25 AM  Result Value Ref Range   WBC 9.8 4.0 - 10.5 K/uL   RBC 4.51 4.22 - 5.81 MIL/uL   Hemoglobin 13.5 13.0 - 17.0 g/dL   HCT 53.9 76.7 - 34.1 %   MCV 90.7 80.0 - 100.0 fL   MCH 29.9 26.0 - 34.0 pg   MCHC 33.0 30.0 - 36.0 g/dL   RDW 93.7 90.2 - 40.9 %   Platelets 222 150 - 400 K/uL   nRBC 0.2 0.0 - 0.2 %    Comment: Performed at [facility], [address] [place], [place] [address]  Urine Drug Screen, Qualitative     Status: Abnormal   Collection Time: 06/16/21 12:25 AM  Result Value Ref Range   Tricyclic, Ur Screen NONE DETECTED NONE DETECTED   Amphetamines, Ur Screen NONE DETECTED NONE DETECTED   MDMA (Ecstasy)Ur Screen NONE DETECTED NONE  DETECTED   Cocaine Metabolite,Ur [place] POSITIVE (A) NONE DETECTED   Opiate, Ur Screen NONE DETECTED NONE DETECTED   Phencyclidine (PCP) Ur S NONE DETECTED NONE DETECTED   Cannabinoid 50 Ng, Ur [place] POSITIVE (A) NONE DETECTED   Barbiturates, Ur Screen NONE DETECTED NONE DETECTED   Benzodiazepine, Ur Scrn NONE DETECTED NONE DETECTED   Methadone Scn, Ur NONE DETECTED NONE DETECTED    Comment: (NOTE) Tricyclics + metabolites, urine    Cutoff 1000 ng/mL Amphetamines + metabolites, urine  Cutoff 1000 ng/mL MDMA (Ecstasy), urine              Cutoff 500 ng/mL Cocaine Metabolite, urine          Cutoff 300 ng/mL Opiate + metabolites, urine        Cutoff 300 ng/mL Phencyclidine (PCP), urine         Cutoff 25 ng/mL Cannabinoid, urine                 Cutoff 50 ng/mL Barbiturates + metabolites, urine  Cutoff 200 ng/mL Benzodiazepine, urine              Cutoff 200 ng/mL Methadone, urine                   Cutoff 300 ng/mL  The urine drug screen provides only a preliminary, unconfirmed analytical test result and should not be used for non-medical purposes. Clinical consideration and professional judgment should be applied to any positive drug screen result due to possible interfering substances. A more specific alternate chemical method must be used in order to obtain a confirmed analytical result. Gas chromatography / mass spectrometry (GC/MS) is the preferred confirm atory method. Performed at [facility], [address] [place], [place] [address]     Current Facility-Administered Medications  Medication Dose Route Frequency  Provider Last Rate Last Admin   chlordiazePOXIDE (LIBRIUM) capsule 50 mg  50 mg Oral TID Nita Sickle, MD       Current Outpatient Medications  Medication Sig Dispense Refill   divalproex (DEPAKOTE) 500 MG DR tablet Take 1 tablet (500 mg total) by mouth every 12 (twelve) hours. 60 tablet 1   gabapentin (NEURONTIN) 300 MG capsule Take 300 mg by mouth 3  (three) times daily. (Patient not taking: Reported on 03/14/2021)     omeprazole (PRILOSEC) 40 MG capsule Take 40 mg by mouth daily. (Patient not taking: Reported on 03/14/2021)      Musculoskeletal: Strength & Muscle Tone: within normal limits Gait & Station: normal Patient leans: N/A  Psychiatric Specialty Exam:  Presentation  General Appearance: Appropriate for Environment; Casual  Eye Contact:Fair  Speech:Clear and Coherent  Speech Volume:Normal  Handedness:Right   Mood and Affect  Mood:Anxious; Dysphoric; Hopeless  Affect:Depressed   Thought Process  Thought Processes:Coherent  Descriptions of Associations:Intact  Orientation:Full (Time, Place and Person)  Thought Content:Logical; WDL  History of Schizophrenia/Schizoaffective disorder:No  Duration of Psychotic Symptoms:Less than six months  Hallucinations:Hallucinations: Auditory; Command  Ideas of Reference:Paranoia  Suicidal Thoughts:Suicidal Thoughts: Yes, Active SI Active Intent and/Jeremy Plan: With Intent; With Means to Carry Out; With Access to Means  Homicidal Thoughts:Homicidal Thoughts: No   Sensorium  Memory:Immediate Good; Recent Good  Judgment:Impaired  Insight:Fair   Executive Functions  Concentration:Fair  Attention Span:Fair  Recall:Fair  Fund of Knowledge:Fair  Language:Fair   Psychomotor Activity  Psychomotor Activity:Psychomotor Activity: Normal   Assets  Assets:Communication Skills; Desire for Improvement   Sleep  Sleep:Sleep: Poor   Physical Exam: Physical Exam Vitals and nursing note reviewed.  HENT:     Head: Normocephalic and atraumatic.     Mouth/Throat:     Mouth: Mucous membranes are dry.  Eyes:     Pupils: Pupils are equal, round, and reactive to light.  Pulmonary:     Effort: Pulmonary effort is normal.  Musculoskeletal:        General: Normal range of motion.     Cervical back: Normal range of motion.  Skin:    General: Skin is warm and  dry.  Neurological:     Mental Status: He is alert and oriented to person, place, and time.  Psychiatric:        Attention and Perception: Attention and perception normal.        Mood and Affect: Mood is depressed. Affect is flat.        Speech: Speech normal.        Behavior: Behavior normal. Behavior is cooperative.        Thought Content: Thought content includes suicidal ideation. Thought content includes suicidal plan.        Cognition and Memory: Cognition and memory normal.        Judgment: Judgment is impulsive and inappropriate.   Review of Systems  Psychiatric/Behavioral:  Positive for depression, hallucinations, substance abuse and suicidal ideas. Negative for memory loss. The patient has insomnia. The patient is not nervous/anxious.   All other systems reviewed and are negative. Blood pressure (!) 148/102, pulse 64, temperature 98.4 F (36.9 C), resp. rate 16, height 5\' 11"  (1.803 m), weight 108.9 kg, SpO2 100 %. Body mass index is 33.47 kg/m.  Treatment Plan Summary: Daily contact with patient to assess and evaluate symptoms and progress in treatment, Medication management, and Plan restart Depakote 500mg  q12 hrs and risperidone 1mg  at bedtime  Disposition: No evidence  of imminent risk to self Jeremy others at present.   Supportive therapy provided about ongoing stressors. Discussed crisis plan, support from social network, calling 911, coming to the Emergency Department, and calling Suicide Hotline.  Jearld Lesch, NP 06/16/2021 3:15 AM

## 2021-06-16 NOTE — BH Assessment (Signed)
Comprehensive Clinical Assessment (CCA) Note  06/16/2021 Jeremy Frank [id number]  Chief Complaint: Patient is a 35 year old male presenting to [facility] ED voluntarily seeking help for his depression and hallucinations. Per triage note Pt states is suicidal. Pt states he is also hallucinating. Pt states he was thinking about walking [place] front [place] tonight. During assessment patient appears alert and oriented x4, calm and cooperative, mood appears depressed, patient appears to be under the influence of a substance. Patient reports "I started hallucinating and feeling suicidal." "My symptoms started to hit me, my PTSD hits me out of nowhere from the war." Patient reports that he has been discharged from the army since 2008 and since then has not had any Outpatient treatment for it, patient reports having difficulty finding transportation to the [place] and reports having no money to pay for his medications. Patient reports that he has been self medicating due to this. Patient reports using "marijuana mostly and alcohol." Patient UDS is positive for Cocaine and Cannabinoids. Patient BAL is 88. Patient reports having AH "they are commends that won't stop it's like military commands." Patient reports experiencing his AH when he is not using as well. Patient also reports experiencing VH "at times." Patient reports SI with a plan "I was walking down [address]." Patient reports that he lives with his mother from time to time but reports "I've been camping mostly." Patient continues to report SI/AH, denies HI  Per Psyc NP Lodema Pilot Dixon patient is recommended for Inpatient Hospitalization  Chief Complaint  Patient presents with   Suicidal   Visit Diagnosis: Major Depressive Disorder, PTSD, Polysubstance Use    CCA Screening, Triage and Referral (STR)  Patient Reported Information How did you hear about [place]? Self  Referral name: No data recorded Referral phone number: No data recorded  Whom do you see for  routine medical problems? 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 Patient presents to [facility] ED voluntarily due to hallucinations and SI with a plan  How Long Has This Been Causing You Problems? > than 6 months  What Do You Feel Would Help You the Most Today? Treatment for Depression or other mood problem   Have You Recently Been in Any Inpatient Treatment (Hospital/Detox/Crisis Center/28-Day Program)? Yes  Name/Location of Program/Hospital:[facility] in [place], [place]  How Long Were You There? 2 weeks  When Were You Discharged? No data recorded  Have You Ever Received Services From [facility] Before? No data recorded Who Do You See at [facility]? No data recorded  Have You Recently Had Any Thoughts About Hurting Yourself? Yes  Are You Planning to Commit Suicide/Harm Yourself At This time? Yes   Have you Recently Had Thoughts About Hurting Someone [name]? No  Explanation: No data recorded  Have You Used Any Alcohol or Drugs in the Past 24 Hours? Yes  How Long Ago Did You Use Drugs or Alcohol? No data recorded What Did You Use and How Much? Cocaine, Marijuana, Alcohol   Do You Currently Have a Therapist/Psychiatrist? No  Name of Therapist/Psychiatrist: No data recorded  Have You Been Recently Discharged From Any Office Practice or Programs? No  Explanation of Discharge From Practice/Program: No data recorded    CCA Screening Triage Referral Assessment Type of Contact: Face-to-Face  Is this Initial or Reassessment? No data recorded Date Telepsych consult ordered in [facility]:  10/19/20  Time Telepsych consult ordered in  [facility]:  0935   Patient Reported Information Reviewed? Yes  Patient Left Without Being Seen? No data recorded Reason for Not Completing Assessment: No data recorded  Collateral Involvement: No data recorded  Does Patient Have a Court Appointed Legal Guardian? No data recorded Name and Contact of Legal Guardian: self  If Minor and Not Living with Parent(s), Who has Custody? n/a  Is CPS involved or ever been involved? Never  Is APS involved or ever been involved? Never   Patient Determined To Be At Risk for Harm To Self or Others Based on Review of Patient Reported Information or Presenting Complaint? Yes, for Self-Harm  Method: No data recorded Availability of Means: No data recorded Intent: No data recorded Notification Required: No data recorded Additional Information for Danger to Others Potential: No data recorded Additional Comments for Danger to Others Potential: No data recorded Are There Guns or Other Weapons in Your Home? No data recorded Types of Guns/Weapons: No data recorded Are These Weapons Safely Secured?                            No data recorded Who Could Verify You Are Able To Have These Secured: No data recorded Do You Have any Outstanding Charges, Pending Court Dates, Parole/Probation? No data recorded Contacted To Inform of Risk of Harm To Self or Others: No data recorded  Location of Assessment: [facility] ED   Does Patient Present under Involuntary Commitment? No  IVC Papers Initial File Date: 03/14/21   [place] of Residence: [place]   Patient Currently Receiving the Following Services: Medication Management   Determination of Need: Emergent (2 hours)   Options For Referral: ED Visit; Inpatient Hospitalization; Medication Management     CCA Biopsychosocial Intake/Chief Complaint:  Patient reports he hallucinating and has not taken his psych medications in 10 days. Patient states he "seld-medicates" with alcohol and cocaine.  Current  Symptoms/Problems: Visual and auditory hallucinations, lack of sleep, binge eating   Patient Reported Schizophrenia/Schizoaffective Diagnosis in Past: No   Strengths: Patient is able to communicate his needs  Preferences: Unk  Abilities: Unk   Type of Services Patient Feels are Needed: medication management   Initial Clinical Notes/Concerns: None   Mental Health Symptoms Depression:   Change in energy/activity; Difficulty Concentrating; Increase/decrease in appetite; Sleep (too much or little)   Duration of Depressive symptoms:  Greater than two weeks   Mania:   None   Anxiety:    Worrying; Tension; Sleep; Restlessness; Difficulty concentrating   Psychosis:   Hallucinations   Duration of Psychotic symptoms:  Greater than six months   Trauma:   Avoids reminders of event; Difficulty staying/falling asleep; Emotional numbing   Obsessions:   None   Compulsions:   None   Inattention:   None   Hyperactivity/Impulsivity:   None   Oppositional/Defiant Behaviors:   None   Emotional Irregularity:   None   Other Mood/Personality Symptoms:  No data recorded   Mental Status Exam Appearance and self-care  Stature:   Tall   Weight:   Average weight   Clothing:   Casual   Grooming:   Normal   Cosmetic use:   None   Posture/gait:  Normal   Motor activity:   Not Remarkable   Sensorium  Attention:   Normal   Concentration:   Scattered   Orientation:   X5   Recall/memory:   Normal   Affect and Mood  Affect:   Anxious   Mood:   Anxious   Relating  Eye contact:   Normal   Facial expression:   Responsive   Attitude toward examiner:   Cooperative   Thought and Language  Speech flow:  Clear and Coherent   Thought content:   Appropriate to Mood and Circumstances   Preoccupation:   None   Hallucinations:   Auditory; Command (Comment) (Patient reports his AH are "like Facilities manager voices")   Organization:  No  data recorded  [employer] of Knowledge:   Fair   Intelligence:   Average   Abstraction:   Functional   Judgement:   Fair   Dance movement psychotherapist:   Adequate   Insight:   Fair   Decision Making:   Normal   Social Functioning  Social Maturity:   Isolates   Social Judgement:   Normal   Stress  Stressors:   Housing; Office manager Ability:   Contractor Deficits:   None   Supports:   Family     Religion: Religion/Spirituality Are You A Religious Person?: No  Leisure/Recreation: Leisure / Recreation Do You Have Hobbies?: No  Exercise/Diet: Exercise/Diet Do You Exercise?: No Have You Gained or Lost A Significant Amount of Weight in the Past Six Months?: No Do You Follow a Special Diet?: No Do You Have Any Trouble Sleeping?: Yes Explanation of Sleeping Difficulties: Patient reports difficulty sleeping   CCA Employment/Education Employment/Work Situation: Employment / Work Situation 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?: No  Education: Education Is Patient Currently Attending School?: No Did You Have An Individualized Education Program (IIEP): No Did You Have Any Difficulty At [employer]?: No Patient's Education Has Been Impacted by Current Illness: No   CCA Family/Childhood History Family and Relationship History: Family history Marital status: Single Does patient have children?: No  Childhood History:  Childhood History By whom was/is the patient raised?: Mother Did patient suffer any verbal/emotional/physical/sexual abuse as a child?: No Did patient suffer from severe childhood neglect?: No Has patient ever been sexually abused/assaulted/raped as an adolescent or adult?: No Was the patient ever a victim of a crime or a disaster?: No Witnessed domestic violence?:  No Has patient been affected by domestic violence as an adult?: No  Child/Adolescent Assessment:     CCA Substance Use Alcohol/Drug Use: Alcohol / Drug Use Pain Medications: See MAR Prescriptions: See MAR Over the Counter: See MAR History of alcohol / drug use?: Yes Substance #1 Name of Substance 1: Cocaine 1 - Last Use / Amount: 06/16/21 Substance #2 Name of Substance 2: Marijuana 2 - Last Use / Amount: 06/16/21 Substance #3 Name of Substance 3: Alcohol 3 - Last Use / Amount: 06/16/21                   ASAM's:  Six Dimensions of Multidimensional Assessment  Dimension 1:  Acute Intoxication and/or Withdrawal Potential:      Dimension 2:  Biomedical Conditions and Complications:      Dimension 3:  Emotional, Behavioral, or Cognitive Conditions and Complications:     Dimension 4:  Readiness to Change:     Dimension 5:  Relapse, Continued use, or Continued Problem Potential:     Dimension 6:  Recovery/Living Environment:     ASAM Severity Score:    ASAM Recommended Level of Treatment:     Substance use Disorder (SUD) Substance Use Disorder (SUD)  Checklist Symptoms of Substance Use: Continued use despite having a persistent/recurrent physical/psychological problem caused/exacerbated by use, Continued use despite persistent or recurrent social, interpersonal problems, caused or exacerbated by use, Presence of craving or strong urge to use  Recommendations for Services/Supports/Treatments:  Per Psyc NP Rashaun Dixon patient is recommended for Inpatient Hospitalization   DSM5 Diagnoses: Patient Active Problem List   Diagnosis Date Noted   MDD (major depressive disorder), recurrent episode (HCC) 06/16/2021   Suicidal ideations 06/16/2021   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   Hypertension 10/19/2020   Psychosis (HCC) 06/13/2015   Posttraumatic stress disorder 06/13/2015    Traumatic brain injury (HCC) 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

## 2021-06-17 ENCOUNTER — Other Ambulatory Visit: Payer: Self-pay

## 2021-06-17 ENCOUNTER — Inpatient Hospital Stay
Admission: RE | Admit: 2021-06-17 | Discharge: 2021-06-24 | DRG: 885 | Disposition: A | Discharge status: Home / Self Care | Payer: No Typology Code available for payment source | Source: Intra-hospital | Attending: Behavioral Health | Admitting: Behavioral Health

## 2021-06-17 ENCOUNTER — Encounter: Payer: Self-pay | Admitting: Psychiatry

## 2021-06-17 DIAGNOSIS — F333 Major depressive disorder, recurrent, severe with psychotic symptoms: Principal | ICD-10-CM | POA: Diagnosis present

## 2021-06-17 DIAGNOSIS — F4312 Post-traumatic stress disorder, chronic: Secondary | ICD-10-CM | POA: Diagnosis present

## 2021-06-17 DIAGNOSIS — F1210 Cannabis abuse, uncomplicated: Secondary | ICD-10-CM | POA: Diagnosis present

## 2021-06-17 DIAGNOSIS — F17200 Nicotine dependence, unspecified, uncomplicated: Secondary | ICD-10-CM | POA: Diagnosis present

## 2021-06-17 DIAGNOSIS — G4700 Insomnia, unspecified: Secondary | ICD-10-CM | POA: Diagnosis present

## 2021-06-17 DIAGNOSIS — F4310 Post-traumatic stress disorder, unspecified: Secondary | ICD-10-CM | POA: Diagnosis present

## 2021-06-17 DIAGNOSIS — F1420 Cocaine dependence, uncomplicated: Secondary | ICD-10-CM | POA: Diagnosis present

## 2021-06-17 DIAGNOSIS — I10 Essential (primary) hypertension: Secondary | ICD-10-CM | POA: Diagnosis present

## 2021-06-17 DIAGNOSIS — Z79899 Other long term (current) drug therapy: Secondary | ICD-10-CM

## 2021-06-17 DIAGNOSIS — S069XAA Unspecified intracranial injury with loss of consciousness status unknown, initial encounter: Secondary | ICD-10-CM | POA: Diagnosis present

## 2021-06-17 DIAGNOSIS — F1020 Alcohol dependence, uncomplicated: Secondary | ICD-10-CM | POA: Diagnosis present

## 2021-06-17 DIAGNOSIS — R45850 Homicidal ideations: Secondary | ICD-10-CM | POA: Diagnosis present

## 2021-06-17 DIAGNOSIS — S069X9A Unspecified intracranial injury with loss of consciousness of unspecified duration, initial encounter: Secondary | ICD-10-CM | POA: Diagnosis present

## 2021-06-17 DIAGNOSIS — Z23 Encounter for immunization: Secondary | ICD-10-CM

## 2021-06-17 DIAGNOSIS — R45851 Suicidal ideations: Secondary | ICD-10-CM | POA: Diagnosis present

## 2021-06-17 DIAGNOSIS — K219 Gastro-esophageal reflux disease without esophagitis: Secondary | ICD-10-CM | POA: Diagnosis present

## 2021-06-17 DIAGNOSIS — F17210 Nicotine dependence, cigarettes, uncomplicated: Secondary | ICD-10-CM | POA: Diagnosis present

## 2021-06-17 MED ORDER — ACETAMINOPHEN 325 MG PO TABS: 650.0000 mg | ORAL_TABLET | Freq: Four times a day (QID) | ORAL | Status: DC | PRN

## 2021-06-17 MED ORDER — HYDROXYZINE HCL 25 MG PO TABS: 25.0000 mg | ORAL_TABLET | Freq: Four times a day (QID) | ORAL | Status: AC | PRN

## 2021-06-17 MED ORDER — THIAMINE HCL 100 MG PO TABS
100.0000 mg | ORAL_TABLET | Freq: Every day | ORAL | Status: DC
  Administered 2021-06-18 – 2021-06-24 (×7): 100 mg via ORAL
  Filled 2021-06-17 (×7): qty 1

## 2021-06-17 MED ORDER — DIVALPROEX SODIUM 500 MG PO DR TAB
500.0000 mg | DELAYED_RELEASE_TABLET | Freq: Two times a day (BID) | ORAL | Status: DC
  Administered 2021-06-17 – 2021-06-24 (×14): 500 mg via ORAL
  Filled 2021-06-17 (×14): qty 1

## 2021-06-17 MED ORDER — PNEUMOCOCCAL VAC POLYVALENT 25 MCG/0.5ML IJ INJ
0.5000 mL | INJECTION | INTRAMUSCULAR | Status: AC
  Administered 2021-06-18: 0.5 mL via INTRAMUSCULAR
  Filled 2021-06-17 (×2): qty 0.5

## 2021-06-17 MED ORDER — RISPERIDONE 1 MG PO TABS
1.0000 mg | ORAL_TABLET | Freq: Every day | ORAL | Status: DC
  Administered 2021-06-17: 1 mg via ORAL
  Filled 2021-06-17: qty 1

## 2021-06-17 MED ORDER — GABAPENTIN 100 MG PO CAPS
100.0000 mg | ORAL_CAPSULE | Freq: Three times a day (TID) | ORAL | Status: DC
  Administered 2021-06-17 – 2021-06-23 (×18): 100 mg via ORAL
  Filled 2021-06-17 (×18): qty 1

## 2021-06-17 MED ORDER — ONDANSETRON 4 MG PO TBDP: 4.0000 mg | ORAL_TABLET | Freq: Four times a day (QID) | ORAL | Status: AC | PRN

## 2021-06-17 MED ORDER — LOPERAMIDE HCL 2 MG PO CAPS: 2.0000 mg | ORAL_CAPSULE | ORAL | Status: AC | PRN

## 2021-06-17 MED ORDER — ADULT MULTIVITAMIN W/MINERALS CH
1.0000 | ORAL_TABLET | Freq: Every day | ORAL | Status: DC
  Administered 2021-06-18 – 2021-06-24 (×7): 1 via ORAL
  Filled 2021-06-17 (×7): qty 1

## 2021-06-17 MED ORDER — MAGNESIUM HYDROXIDE 400 MG/5ML PO SUSP: 30.0000 mL | Freq: Every day | ORAL | Status: DC | PRN

## 2021-06-17 MED ORDER — ALUM & MAG HYDROXIDE-SIMETH 200-200-20 MG/5ML PO SUSP: 30.0000 mL | ORAL | Status: DC | PRN

## 2021-06-17 NOTE — ED Notes (Signed)
Hourly rounding reveals patient in room. No complaints, stable, in no acute distress. Q15 minute rounds and monitoring via Security Cameras to continue. 

## 2021-06-17 NOTE — ED Notes (Signed)
Gave lunch tray with drink. 

## 2021-06-17 NOTE — Progress Notes (Signed)
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 Pt came in with AH and SI to walk into traffic or shot self with gun. Pt reports being homeless and not being able to find transportation to get to the [place] to get to appointments or have medications filled. Pt shares he self medicates with drugs and alcohol. Pt states that he has financial and legal issues. Pt states he's on probations for breaking and entering an abandoned house. Pt shares he doesn't receive much money from the [place]. Pt share while homeless he hears trees talking and sees military figures around him at times and they're not the good kind, sometimes they are ducking in the bushes trying to hide from me. Pt denies experiencing any SI/HI/AVH at this time. Pt reports feeling safe while he is here. Pt would like long term substance abuse treatment and help with living situation. Skin assessment performed upon admission. Pt has abrasions on back of Left hand that is healing.

## 2021-06-17 NOTE — ED Notes (Signed)
Breakfast tray with drink was given.

## 2021-06-17 NOTE — ED Notes (Signed)
VOL/pending placement 

## 2021-06-17 NOTE — ED Notes (Signed)
VS not taken, patient asleep 

## 2021-06-17 NOTE — ED Notes (Signed)
Pt discharged to BMU. VS stable. 2 bags sent with patient. Pt calm and cooperative.

## 2021-06-17 NOTE — Consult Note (Signed)
[facility] Face-to-Face Psychiatry Consult   Reason for Consult: Follow-up consult 35 year old man with a history of PTSD and depression comes to the hospital with hallucinations and suicidal thoughts Referring Physician: Katrinka Blazing Patient Identification: Jeremy Frank MRN:  [id number] Principal Diagnosis: Major depressive disorder, recurrent episode, severe, with psychosis (HCC) Diagnosis:  Principal Problem:   Major depressive disorder, recurrent episode, severe, with psychosis (HCC) Active Problems:   Psychosis (HCC)   Posttraumatic stress disorder   Cocaine dependence (HCC)   Suicidal ideations   Total Time spent with patient: 30 minutes  Subjective:   Jeremy Frank is a 35 y.o. male patient admitted with "the same stuff.  Hearing things and suicidal thoughts.".  HPI: Patient seen chart reviewed.  35 year old man with a history of PTSD and depression and substance abuse.  Came back to the hospital with suicidal thoughts hallucinations depression.  Was restarted on Depakote over the weekend.  Says he feels a little better but still does not feel normal and still has some passive suicidal thoughts.  Has been cooperative.  Past Psychiatric History: History of chronic PTSD and depression and suicidality.  History of drug abuse  Risk to Self:   Risk to Others:   Prior Inpatient Therapy:   Prior Outpatient Therapy:    Past Medical History:  Past Medical History:  Diagnosis Date   Drug dependence (HCC)    GI bleed    Hiatal hernia    Hypertension    PTSD (post-traumatic stress disorder)     Past Surgical History:  Procedure Laterality Date   KNEE SURGERY     Family History: No family history on file. Family Psychiatric  History: See previous Social History:  Social History   Substance and Sexual Activity  Alcohol Use Yes     Social History   Substance and Sexual Activity  Drug Use Yes   Types: Cocaine, Marijuana    Social History   Socioeconomic History   Marital  status: Single    Spouse name: Not on file   Number of children: Not on file   Years of education: Not on file   Highest education level: Not on file  Occupational History   Not on file  Tobacco Use   Smoking status: Every Day    Pack years: 0.00    Types: Cigarettes   Smokeless tobacco: Never  Substance and Sexual Activity   Alcohol use: Yes   Drug use: Yes    Types: Cocaine, Marijuana   Sexual activity: Not on file  Other Topics Concern   Not on file  Social History Narrative   Not on file   Social Determinants of Health   Financial Resource Strain: Not on file  Food Insecurity: Not on file  Transportation Needs: Not on file  Physical Activity: Not on file  Stress: Not on file  Social Connections: Not on file   Additional Social History:    Allergies:   Allergies  Allergen Reactions   Cheese Diarrhea   Eggs Or Egg-Derived Products Diarrhea   Peanut-Containing Drug Products Diarrhea    Labs:  Results for orders placed or performed during the hospital encounter of 06/16/21 (from the past 48 hour(s))  Comprehensive metabolic panel     Status: Abnormal   Collection Time: 06/16/21 12:25 AM  Result Value Ref Range   Sodium 136 135 - 145 mmol/L   Potassium 4.0 3.5 - 5.1 mmol/L   Chloride 109 98 - 111 mmol/L   CO2 19 (L) 22 -  32 mmol/L   Glucose, Bld 86 70 - 99 mg/dL    Comment: Glucose reference range applies only to samples taken after fasting for at least 8 hours.   BUN 18 6 - 20 mg/dL   Creatinine, Ser 0.45 (H) 0.61 - 1.24 mg/dL   Calcium 9.0 8.9 - 40.9 mg/dL   Total Protein 8.5 (H) 6.5 - 8.1 g/dL   Albumin 4.8 3.5 - 5.0 g/dL   AST 44 (H) 15 - 41 U/L   ALT 27 0 - 44 U/L   Alkaline Phosphatase 58 38 - 126 U/L   Total Bilirubin 0.5 0.3 - 1.2 mg/dL   GFR, Estimated >81 >19 mL/min    Comment: (NOTE) Calculated using the CKD-EPI Creatinine Equation (2021)    Anion gap 8 5 - 15    Comment: Performed at [facility], [address]  [place], [place] [address]  Ethanol     Status: Abnormal   Collection Time: 06/16/21 12:25 AM  Result Value Ref Range   Alcohol, Ethyl (B) 88 (H) <10 mg/dL    Comment: (NOTE) Lowest detectable limit for serum alcohol is 10 mg/dL.  For medical purposes only. Performed at [facility], [address] [place], [place] [address]   Salicylate level     Status: Abnormal   Collection Time: 06/16/21 12:25 AM  Result Value Ref Range   Salicylate Lvl <7.0 (L) 7.0 - 30.0 mg/dL    Comment: Performed at [facility], [address] [place], [place] [address]  Acetaminophen level     Status: Abnormal   Collection Time: 06/16/21 12:25 AM  Result Value Ref Range   Acetaminophen (Tylenol), Serum <10 (L) 10 - 30 ug/mL    Comment: (NOTE) Therapeutic concentrations vary significantly. A range of 10-30 ug/mL  may be an effective concentration for many patients. However, some  are best treated at concentrations outside of this range. Acetaminophen concentrations >150 ug/mL at 4 hours after ingestion  and >50 ug/mL at 12 hours after ingestion are often associated with  toxic reactions.  Performed at [facility], [address] [place], [place] [address]   cbc     Status: None   Collection Time: 06/16/21 12:25 AM  Result Value Ref Range   WBC 9.8 4.0 - 10.5 K/uL   RBC 4.51 4.22 - 5.81 MIL/uL   Hemoglobin 13.5 13.0 - 17.0 g/dL   HCT 62.9 52.8 - 41.3 %   MCV 90.7 80.0 - 100.0 fL   MCH 29.9 26.0 - 34.0 pg   MCHC 33.0 30.0 - 36.0 g/dL   RDW 24.4 01.0 - 27.2 %   Platelets 222 150 - 400 K/uL   nRBC 0.2 0.0 - 0.2 %    Comment: Performed at [facility], [address], [place], [place] [address]  Urine Drug Screen, Qualitative     Status: Abnormal   Collection Time: 06/16/21 12:25 AM  Result Value Ref Range   Tricyclic, Ur Screen NONE DETECTED NONE DETECTED   Amphetamines, Ur Screen NONE DETECTED NONE DETECTED   MDMA (Ecstasy)Ur Screen NONE DETECTED NONE  DETECTED   Cocaine Metabolite,Ur [place] POSITIVE (A) NONE DETECTED   Opiate, Ur Screen NONE DETECTED NONE DETECTED   Phencyclidine (PCP) Ur S NONE DETECTED NONE DETECTED   Cannabinoid 50 Ng, Ur [place] POSITIVE (A) NONE DETECTED   Barbiturates, Ur Screen NONE DETECTED NONE DETECTED   Benzodiazepine, Ur Scrn NONE DETECTED NONE DETECTED   Methadone Scn, Ur NONE DETECTED NONE  DETECTED    Comment: (NOTE) Tricyclics + metabolites, urine    Cutoff 1000 ng/mL Amphetamines + metabolites, urine  Cutoff 1000 ng/mL MDMA (Ecstasy), urine              Cutoff 500 ng/mL Cocaine Metabolite, urine          Cutoff 300 ng/mL Opiate + metabolites, urine        Cutoff 300 ng/mL Phencyclidine (PCP), urine         Cutoff 25 ng/mL Cannabinoid, urine                 Cutoff 50 ng/mL Barbiturates + metabolites, urine  Cutoff 200 ng/mL Benzodiazepine, urine              Cutoff 200 ng/mL Methadone, urine                   Cutoff 300 ng/mL  The urine drug screen provides only a preliminary, unconfirmed analytical test result and should not be used for non-medical purposes. Clinical consideration and professional judgment should be applied to any positive drug screen result due to possible interfering substances. A more specific alternate chemical method must be used in order to obtain a confirmed analytical result. Gas chromatography / mass spectrometry (GC/MS) is the preferred confirm atory method. Performed at [facility], [address] [place], [place] [address]   Resp Panel by RT-PCR (Flu A&B, Covid) Nasopharyngeal Swab     Status: None   Collection Time: 06/16/21  3:00 AM   Specimen: Nasopharyngeal Swab; Nasopharyngeal(NP) swabs in vial transport medium  Result Value Ref Range   SARS Coronavirus 2 by RT PCR NEGATIVE NEGATIVE    Comment: (NOTE) SARS-CoV-2 target nucleic acids are NOT DETECTED.  The SARS-CoV-2 RNA is generally detectable in upper respiratory specimens during the acute phase of  infection. The lowest concentration of SARS-CoV-2 viral copies this assay can detect is 138 copies/mL. A negative result does not preclude SARS-Cov-2 infection and should not be used as the sole basis for treatment or other patient management decisions. A negative result may occur with  improper specimen collection/handling, submission of specimen other than nasopharyngeal swab, presence of viral mutation(s) within the areas targeted by this assay, and inadequate number of viral copies(<138 copies/mL). A negative result must be combined with clinical observations, patient history, and epidemiological information. The expected result is Negative.  Fact Sheet for Patients:  BloggerCourse.comhttps://www.fda.gov/media/152166/download  Fact Sheet for Healthcare Providers:  SeriousBroker.ithttps://www.fda.gov/media/152162/download  This test is no t yet approved or cleared by the [place] FDA and  has been authorized for detection and/or diagnosis of SARS-CoV-2 by FDA under an Emergency Use Authorization (EUA). This EUA will remain  in effect (meaning this test can be used) for the duration of the COVID-19 declaration under Section 564(b)(1) of the Act, 21 U.S.C.section 360bbb-3(b)(1), unless the authorization is terminated  or revoked sooner.       Influenza A by PCR NEGATIVE NEGATIVE   Influenza B by PCR NEGATIVE NEGATIVE    Comment: (NOTE) The Xpert Xpress SARS-CoV-2/FLU/RSV plus assay is intended as an aid in the diagnosis of influenza from Nasopharyngeal swab specimens and should not be used as a sole basis for treatment. Nasal washings and aspirates are unacceptable for Xpert Xpress SARS-CoV-2/FLU/RSV testing.  Fact Sheet for Patients: BloggerCourse.comhttps://www.fda.gov/media/152166/download  Fact Sheet for Healthcare Providers: SeriousBroker.ithttps://www.fda.gov/media/152162/download  This test is not yet approved or cleared by the [place] FDA and has been authorized for detection and/or diagnosis of  SARS-CoV-2 by FDA  under an Emergency Use Authorization (EUA). This EUA will remain in effect (meaning this test can be used) for the duration of the COVID-19 declaration under Section 564(b)(1) of the Act, 21 U.S.C. section 360bbb-3(b)(1), unless the authorization is terminated or revoked.  Performed at [facility], [address] [place], [place] [address]     Current Facility-Administered Medications  Medication Dose Route Frequency Provider Last Rate Last Admin   divalproex (DEPAKOTE) DR tablet 500 mg  500 mg Oral Q12H Dixon, Rashaun M, NP   500 mg at 06/17/21 2831   gabapentin (NEURONTIN) capsule 100 mg  100 mg Oral TID Charm Rings, NP   100 mg at 06/17/21 5176   hydrOXYzine (ATARAX/VISTARIL) tablet 25 mg  25 mg Oral Q6H PRN Charm Rings, NP       loperamide (IMODIUM) capsule 2-4 mg  2-4 mg Oral PRN Charm Rings, NP       LORazepam (ATIVAN) tablet 1 mg  1 mg Oral Q6H PRN Charm Rings, NP       LORazepam (ATIVAN) tablet 1 mg  1 mg Oral TID Charm Rings, NP   1 mg at 06/17/21 1607   Followed by   Melene Muller ON 06/18/2021] LORazepam (ATIVAN) tablet 1 mg  1 mg Oral BID Charm Rings, NP       Followed by   Melene Muller ON 06/19/2021] LORazepam (ATIVAN) tablet 1 mg  1 mg Oral Daily Charm Rings, NP       multivitamin with minerals tablet 1 tablet  1 tablet Oral Daily Charm Rings, NP   1 tablet at 06/17/21 0925   ondansetron (ZOFRAN-ODT) disintegrating tablet 4 mg  4 mg Oral Q6H PRN Charm Rings, NP       risperiDONE (RISPERDAL) tablet 1 mg  1 mg Oral QHS Dixon, Rashaun M, NP   1 mg at 06/16/21 2129   thiamine tablet 100 mg  100 mg Oral Daily Charm Rings, NP   100 mg at 06/17/21 3710   Current Outpatient Medications  Medication Sig Dispense Refill   divalproex (DEPAKOTE) 500 MG DR tablet Take 1 tablet (500 mg total) by mouth every 12 (twelve) hours. (Patient not taking: No sig reported) 60 tablet 1   gabapentin (NEURONTIN) 300 MG capsule Take 300 mg by mouth 3 (three) times  daily. (Patient not taking: No sig reported)     omeprazole (PRILOSEC) 40 MG capsule Take 40 mg by mouth daily. (Patient not taking: No sig reported)      Musculoskeletal: Strength & Muscle Tone: within normal limits Gait & Station: normal Patient leans: N/A            Psychiatric Specialty Exam:  Presentation  General Appearance: Appropriate for Environment; Casual  Eye Contact:Fair  Speech:Clear and Coherent  Speech Volume:Normal  Handedness:Right   Mood and Affect  Mood:Anxious; Dysphoric; Hopeless  Affect:Depressed   Thought Process  Thought Processes:Coherent  Descriptions of Associations:Intact  Orientation:Full (Time, Place and Person)  Thought Content:Logical; WDL  History of Schizophrenia/Schizoaffective disorder:No  Duration of Psychotic Symptoms:Greater than six months  Hallucinations:Hallucinations: Auditory; Command  Ideas of Reference:Paranoia  Suicidal Thoughts:Suicidal Thoughts: Yes, Active SI Active Intent and/or Plan: With Intent; With Means to Carry Out; With Access to Means  Homicidal Thoughts:Homicidal Thoughts: No   Sensorium  Memory:Immediate Good; Recent Good  Judgment:Impaired  Insight:Fair   Executive Functions  Concentration:Fair  Attention Span:Fair  Recall:Fair  Fund of Knowledge:Fair  Language:Fair  Psychomotor Activity  Psychomotor Activity:Psychomotor Activity: Normal   Assets  Assets:Communication Skills; Desire for Improvement   Sleep  Sleep:Sleep: Poor   Physical Exam: Physical Exam Vitals and nursing note reviewed.  Constitutional:      Appearance: Normal appearance.  HENT:     Head: Normocephalic and atraumatic.     Mouth/Throat:     Pharynx: Oropharynx is clear.  Eyes:     Pupils: Pupils are equal, round, and reactive to light.  Cardiovascular:     Rate and Rhythm: Normal rate and regular rhythm.  Pulmonary:     Effort: Pulmonary effort is normal.     Breath sounds:  Normal breath sounds.  Abdominal:     General: Abdomen is flat.     Palpations: Abdomen is soft.  Musculoskeletal:        General: Normal range of motion.  Skin:    General: Skin is warm and dry.  Neurological:     General: No focal deficit present.     Mental Status: He is alert. Mental status is at baseline.  Psychiatric:        Attention and Perception: Attention normal.        Mood and Affect: Mood normal. Affect is blunt.        Speech: Speech is delayed.        Behavior: Behavior is slowed.        Thought Content: Thought content includes suicidal ideation.        Cognition and Memory: Cognition normal.        Judgment: Judgment normal.   Review of Systems  Constitutional: Negative.   HENT: Negative.    Eyes: Negative.   Respiratory: Negative.    Cardiovascular: Negative.   Gastrointestinal: Negative.   Musculoskeletal: Negative.   Skin: Negative.   Neurological: Negative.   Psychiatric/Behavioral:  Positive for depression, hallucinations and suicidal ideas.   Blood pressure 134/75, pulse 62, temperature 98.1 F (36.7 C), temperature source Oral, resp. rate 17, height 5\' 11"  (1.803 m), weight 108.9 kg, SpO2 99 %. Body mass index is 33.47 kg/m.  Treatment Plan Summary: Plan reviewed medication with patient.  He is agreeable to the current meds.  Case reviewed with inpatient staff and emergency room physician.  [facility] and they have all been on [facility].  At this point we feel we have done our due diligence with that and the patient's needs are important enough that we will go ahead and admit him to the psychiatric ward.  Orders will be completed.  Disposition: Recommend psychiatric Inpatient admission when medically cleared.  Mordecai Rasmussen, MD 06/17/2021 11:29 AM

## 2021-06-17 NOTE — Tx Team (Signed)
Initial Treatment Plan 06/17/2021 5:27 PM Jeremy Frank ILN:[id number]    PATIENT STRESSORS: Financial difficulties Legal issue Marital or family conflict Other: homeless   PATIENT STRENGTHS: Barrister's clerk for treatment/growth   PATIENT IDENTIFIED PROBLEMS: Ineffective coping skills  Substance abuse                   DISCHARGE CRITERIA:  Improved stabilization in mood, thinking, and/or behavior Motivation to continue treatment in a less acute level of care  PRELIMINARY DISCHARGE PLAN: Attend 12-step recovery group Outpatient therapy Placement in alternative living arrangements  PATIENT/FAMILY INVOLVEMENT: This treatment plan has been presented to and reviewed with the patient, Jeremy Frank, and/or family member.  The patient and family have been given the opportunity to ask questions and make suggestions.  Sharin Mons, RN 06/17/2021, 5:27 PM

## 2021-06-17 NOTE — ED Provider Notes (Signed)
Emergency Medicine Observation Re-evaluation Note  Jeremy Frank is a 35 y.o. male, seen on rounds today.  Pt initially presented to the ED for complaints of Suicidal Currently, the patient is resting comfortably.  Physical Exam  BP 137/80   Pulse 68   Temp 98.2 F (36.8 C) (Oral)   Resp 18   Ht 5\' 11"  (1.803 m)   Wt 108.9 kg   SpO2 99%   BMI 33.47 kg/m  Physical Exam Constitutional:      Appearance: He is not ill-appearing or toxic-appearing.  HENT:     Head: Atraumatic.  Cardiovascular:     Comments: Well perfused Pulmonary:     Effort: Pulmonary effort is normal.  Abdominal:     General: There is no distension.  Musculoskeletal:        General: No deformity.  Skin:    Findings: No rash.  Neurological:     General: No focal deficit present.     Cranial Nerves: No cranial nerve deficit.     ED Course / MDM  EKG:   I have reviewed the labs performed to date as well as medications administered while in observation.  Recent changes in the last 24 hours include none.  Plan  Current plan is [facility]. Patient is not under full IVC at this time.   , MD 06/17/21 [phone]

## 2021-06-18 DIAGNOSIS — F333 Major depressive disorder, recurrent, severe with psychotic symptoms: Principal | ICD-10-CM

## 2021-06-18 DIAGNOSIS — K279 Peptic ulcer, site unspecified, unspecified as acute or chronic, without hemorrhage or perforation: Secondary | ICD-10-CM | POA: Insufficient documentation

## 2021-06-18 DIAGNOSIS — IMO0002 Reserved for concepts with insufficient information to code with codable children: Secondary | ICD-10-CM | POA: Insufficient documentation

## 2021-06-18 DIAGNOSIS — F1020 Alcohol dependence, uncomplicated: Secondary | ICD-10-CM | POA: Diagnosis present

## 2021-06-18 DIAGNOSIS — F1220 Cannabis dependence, uncomplicated: Secondary | ICD-10-CM | POA: Insufficient documentation

## 2021-06-18 DIAGNOSIS — K5090 Crohn's disease, unspecified, without complications: Secondary | ICD-10-CM | POA: Insufficient documentation

## 2021-06-18 DIAGNOSIS — F1014 Alcohol abuse with alcohol-induced mood disorder: Secondary | ICD-10-CM | POA: Insufficient documentation

## 2021-06-18 DIAGNOSIS — Z9189 Other specified personal risk factors, not elsewhere classified: Secondary | ICD-10-CM | POA: Insufficient documentation

## 2021-06-18 DIAGNOSIS — F17200 Nicotine dependence, unspecified, uncomplicated: Secondary | ICD-10-CM | POA: Diagnosis present

## 2021-06-18 MED ORDER — MIRTAZAPINE 15 MG PO TABS
7.5000 mg | ORAL_TABLET | Freq: Every evening | ORAL | Status: DC | PRN
  Administered 2021-06-19 – 2021-06-23 (×3): 7.5 mg via ORAL
  Filled 2021-06-18 (×3): qty 1

## 2021-06-18 MED ORDER — ARIPIPRAZOLE 5 MG PO TABS
5.0000 mg | ORAL_TABLET | Freq: Every day | ORAL | Status: DC
  Administered 2021-06-18 – 2021-06-20 (×3): 5 mg via ORAL
  Filled 2021-06-18 (×3): qty 1

## 2021-06-18 NOTE — Progress Notes (Signed)
Patient presents guarded with flat affect.  He has concerns regarding his Risperdal and the fact that it can cause breast enlargement in men.  He jokes a little about his breast already starting to become tender. He then ask for information on the medications that he has been prescribed and if he could get information on his meds.  He was med compliant and received his meds without incident. He has been active on the unit and seems to be building a rapport with some other males on the unit. He denies SI/HI/AVH at this encounter. Will continue to monitor with15 minute safety checks and encouraged to come to staff with any concerns.    Cleo Butler-Nicholson, LPN

## 2021-06-18 NOTE — Plan of Care (Signed)
  Problem: Education: Goal: Ability to state activities that reduce stress will improve Outcome: Progressing   Problem: Coping: Goal: Ability to identify and develop effective coping behavior will improve Outcome: Progressing   Problem: Self-Concept: Goal: Ability to identify factors that promote anxiety will improve Outcome: Progressing Goal: Level of anxiety will decrease Outcome: Progressing Goal: Ability to modify response to factors that promote anxiety will improve Outcome: Progressing   Problem: Education: Goal: Knowledge of [facility] Education information/materials will improve Outcome: Progressing Goal: Emotional status will improve Outcome: Progressing Goal: Mental status will improve Outcome: Progressing Goal: Verbalization of understanding the information provided will improve Outcome: Progressing   Problem: Activity: Goal: Interest or engagement in activities will improve Outcome: Progressing Goal: Sleeping patterns will improve Outcome: Progressing   Problem: Coping: Goal: Ability to verbalize frustrations and anger appropriately will improve Outcome: Progressing Goal: Ability to demonstrate self-control will improve Outcome: Progressing   Problem: Health Behavior/Discharge Planning: Goal: Identification of resources available to assist in meeting health care needs will improve Outcome: Progressing Goal: Compliance with treatment plan for underlying cause of condition will improve Outcome: Progressing
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
## 2021-06-18 NOTE — Progress Notes (Signed)
Patient is cooperative with assessment. Affect is inconsistent with thought content. Patient is animated and smiling while telling this Clinical research associate that he endorses suicidal and homicidal ideations. He also states that he sometimes has auditory hallucinations but denies it at the time of assessment. Patient states that he feels he has no support and a conversation with his mother yesterday made him feel worse. He also states that he cannot see his kids and does not feel like there is anything to live for. Patient is observed to interact appropriately with staff and other patients on the unit. He is active on the unit and spends time in the dayroom and going to group. Patient is compliant with morning medications. Support and encouragement provided. Patient remains safe on the unit at this time.
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
## 2021-06-18 NOTE — H&P (Addendum)
Psychiatric Admission Assessment Adult  Patient Identification: Jeremy Frank MRN:  [id number] Date of Evaluation:  06/18/2021 Chief Complaint:  Severe recurrent major depression with psychotic features [facility] [F33.3] Principal Diagnosis: Severe recurrent major depression with psychotic features (HCC) Diagnosis:  Principal Problem:   Severe recurrent major depression with psychotic features (HCC) Active Problems:   Posttraumatic stress disorder   Traumatic brain injury (HCC)   Cocaine dependence (HCC)   Marijuana abuse   Nicotine dependence   Severe alcohol dependence (HCC)  CC: "It's got to get better than this." History of Present Illness: 35 year old male with PTSD, depression, and substance abuse presenting with hallucinations, suicidal ideations, and homicidal ideations.  On exam patient is guarded and somewhat paranoid with minimal eye contact.  He becomes abrasive at times as well.  He notes he suffered from PTSD secondary to combat exposure in [place] while in the [employer]	[place].  Since then he has experienced nightmares, intrusive thoughts, and flashbacks.  On top of the symptoms of PTSD he also has both auditory and visual hallucinations.  He describes seeing ominous looking men who appear to be in [employer] as well as hearing auditory hallucinations.  He has been self-medicating with cocaine, alcohol, and marijuana.  He notes that his mood has been worsening to the point he is having suicidal ideations with plan to shoot himself.  He also notes he is having homicidal ideations towards anyone who might "mess with him".  He states he does not have any support, feels hopeless, worthless, and depressed.  He has been on Depakote before which she found very helpful and he was able to keep from using drugs for 9 months.  He has had experience with other medications in the past, but can really only recall Paxil which caused a lot of weight gain.  He is agreeable to continuing Depakote and  starting Abilify for hallucinations and mood. Associated Signs/Symptoms: Depression Symptoms:  depressed mood, insomnia, feelings of worthlessness/guilt, hopelessness, suicidal thoughts with specific plan, loss of energy/fatigue, Duration of Depression Symptoms: Greater than two weeks  (Hypo) Manic Symptoms:  Distractibility, Hallucinations, Irritable Mood, Anxiety Symptoms:  Excessive Worry, Psychotic Symptoms:  Hallucinations: Auditory Visual Paranoia, PTSD Symptoms: Had a traumatic exposure:  Combat expose in [place] Re-experiencing:  Flashbacks Intrusive Thoughts Nightmares Hypervigilance:  Yes Hyperarousal:  Difficulty Concentrating Emotional Numbness/Detachment Increased Startle Response Irritability/Anger Sleep Avoidance:  Decreased Interest/Participation Total Time spent with patient: 1 hour  Past Psychiatric History: History of chronic PTSD, depression, and suicidality.  Also has a history of polysubstance abuse with marijuana, cocaine, and alcohol.  He notes he has had prior hospitalizations at the [place].  He has been treated with Paxil and Depakote in the past and found Depakote to be helpful.  He is currently supposed to be receiving outpatient services through the [facility] [place], but transportation has been difficult.  Is the patient at risk to self? Yes.    Has the patient been a risk to self in the past 6 months? Yes.    Has the patient been a risk to self within the distant past? Yes.    Is the patient a risk to others? Yes.    Has the patient been a risk to others in the past 6 months? No.  Has the patient been a risk to others within the distant past? No.   Prior Inpatient Therapy:   Prior Outpatient Therapy:    Alcohol Screening: Patient refused Alcohol Screening Tool: Yes 1. How often do you have  a drink containing alcohol?: 4 or more times a week 2. How many drinks containing alcohol do you have on a typical day when you are drinking?: 5 or 6 3. How often do you  have six or more drinks on one occasion?: Daily or almost daily AUDIT-C Score: 10 4. How often during the last year have you found that you were not able to stop drinking once you had started?: Never 5. How often during the last year have you failed to do what was normally expected from you because of drinking?: Never 6. How often during the last year have you needed a first drink in the morning to get yourself going after a heavy drinking session?: Never 7. How often during the last year have you had a feeling of guilt of remorse after drinking?: Never 8. How often during the last year have you been unable to remember what happened the night before because you had been drinking?: Never 9. Have you or someone else been injured as a result of your drinking?: No 10. Has a relative or friend or a doctor or another health worker been concerned about your drinking or suggested you cut down?: No Alcohol Use Disorder Identification Test Final Score (AUDIT): 10 Alcohol Brief Interventions/Follow-up: Alcohol education/Brief advice Substance Abuse History in the last 12 months:  Yes.   Consequences of Substance Abuse: Worsening mental health, legal trouble, suicidal thoughts Previous Psychotropic Medications: Yes  Psychological Evaluations: Yes  Past Medical History:  Past Medical History:  Diagnosis Date   Drug dependence (HCC)    GI bleed    Hiatal hernia    Hypertension    PTSD (post-traumatic stress disorder)     Past Surgical History:  Procedure Laterality Date   KNEE SURGERY     Family History: History reviewed. No pertinent family history. Family Psychiatric  History: None reported Tobacco Screening:   Social History:  Social History   Substance and Sexual Activity  Alcohol Use Yes   Alcohol/week: 42.0 standard drinks   Types: 42 Cans of beer per week   Comment: per pt he drinks 6-12 cans of beer daily     Social History   Substance and Sexual Activity  Drug Use Yes   Types:  Cocaine, Marijuana    Additional Social History: Marital status: Single Are you sexually active?:  (unable to assess) What is your sexual orientation?: Pt declined Has your sexual activity been affected by drugs, alcohol, medication, or emotional stress?: Pt declined Does patient have children?: Yes How many children?: 1 (daughter, [id number]) How is patient's relationship with their children?: Pt states that he does not have custody of his daughter and never gets to see her                         Allergies:   Allergies  Allergen Reactions   Cheese Diarrhea   Eggs Or Egg-Derived Products Diarrhea   Peanut-Containing Drug Products Diarrhea   Lab Results: No results found for this or any previous visit (from the past 48 hour(s)).  Blood Alcohol level:  Lab Results  Component Value Date   ETH 88 (H) 06/16/2021   ETH 49 (H) 03/13/2021    Metabolic Disorder Labs:  No results found for: HGBA1C, MPG No results found for: PROLACTIN No results found for: CHOL, TRIG, HDL, CHOLHDL, VLDL, LDLCALC  Current Medications: Current Facility-Administered Medications  Medication Dose Route Frequency Provider Last Rate Last Admin   acetaminophen (TYLENOL) tablet  650 mg  650 mg Oral Q6H PRN Clapacs, Jackquline Denmark, MD       alum & mag hydroxide-simeth (MAALOX/MYLANTA) 200-200-20 MG/5ML suspension 30 mL  30 mL Oral Q4H PRN Clapacs, Jackquline Denmark, MD       ARIPiprazole (ABILIFY) tablet 5 mg  5 mg Oral Daily Jesse Sans, MD   5 mg at 06/18/21 1132   divalproex (DEPAKOTE) DR tablet 500 mg  500 mg Oral Q12H Clapacs, Jackquline Denmark, MD   500 mg at 06/18/21 0757   gabapentin (NEURONTIN) capsule 100 mg  100 mg Oral TID Clapacs, Jackquline Denmark, MD   100 mg at 06/18/21 1131   hydrOXYzine (ATARAX/VISTARIL) tablet 25 mg  25 mg Oral Q6H PRN Clapacs, Jackquline Denmark, MD       loperamide (IMODIUM) capsule 2-4 mg  2-4 mg Oral PRN Clapacs, Jackquline Denmark, MD       magnesium hydroxide (MILK OF MAGNESIA) suspension 30 mL  30 mL Oral Daily PRN Clapacs,  John T, MD       mirtazapine (REMERON) tablet 7.5 mg  7.5 mg Oral QHS PRN Jesse Sans, MD       multivitamin with minerals tablet 1 tablet  1 tablet Oral Daily Clapacs, Jackquline Denmark, MD   1 tablet at 06/18/21 0757   ondansetron (ZOFRAN-ODT) disintegrating tablet 4 mg  4 mg Oral Q6H PRN Clapacs, Jackquline Denmark, MD       thiamine tablet 100 mg  100 mg Oral Daily Clapacs, John T, MD   100 mg at 06/18/21 0757   PTA Medications: Medications Prior to Admission  Medication Sig Dispense Refill Last Dose   divalproex (DEPAKOTE) 500 MG DR tablet Take 1 tablet (500 mg total) by mouth every 12 (twelve) hours. (Patient not taking: No sig reported) 60 tablet 1    gabapentin (NEURONTIN) 300 MG capsule Take 300 mg by mouth 3 (three) times daily. (Patient not taking: No sig reported)      omeprazole (PRILOSEC) 40 MG capsule Take 40 mg by mouth daily. (Patient not taking: No sig reported)       Musculoskeletal: Strength & Muscle Tone: within normal limits Gait & Station: normal Patient leans: N/A            Psychiatric Specialty Exam:  Presentation  General Appearance: Casual  Eye Contact:Minimal  Speech:Clear and Coherent  Speech Volume:Normal  Handedness:Right   Mood and Affect  Mood:Anxious; Irritable  Affect:Congruent   Thought Process  Thought Processes:Coherent  Duration of Psychotic Symptoms: Greater than six months  Past Diagnosis of Schizophrenia or Psychoactive disorder: No  Descriptions of Associations:Intact  Orientation:Full (Time, Place and Person)  Thought Content:Paranoid Ideation  Hallucinations:Hallucinations: Auditory; Visual  Ideas of Reference:Paranoia  Suicidal Thoughts:Suicidal Thoughts: Yes, Active  Homicidal Thoughts:Homicidal Thoughts: Yes, Passive   Sensorium  Memory:Immediate Good; Remote Fair; Recent Fair  Judgment:Impaired  Insight:Present   Executive Functions  Concentration:Fair  Attention Span:Fair  Recall:Fair  Fund of  Knowledge:Fair  Language:Fair   Psychomotor Activity  Psychomotor Activity:Psychomotor Activity: Normal   Assets  Assets:Desire for Improvement; Financial Resources/Insurance; Resilience   Sleep  Sleep:Sleep: Fair Number of Hours of Sleep: 8    Physical Exam: Physical Exam Vitals and nursing note reviewed.  Constitutional:      Appearance: Normal appearance.  HENT:     Head: Normocephalic and atraumatic.     Right Ear: External ear normal.     Left Ear: External ear normal.     Nose: Nose normal.  Mouth/Throat:     Mouth: Mucous membranes are moist.     Pharynx: Oropharynx is clear.  Eyes:     Extraocular Movements: Extraocular movements intact.     Conjunctiva/sclera: Conjunctivae normal.     Pupils: Pupils are equal, round, and reactive to light.  Cardiovascular:     Rate and Rhythm: Normal rate.     Pulses: Normal pulses.  Pulmonary:     Effort: Pulmonary effort is normal.     Breath sounds: Normal breath sounds.  Abdominal:     General: Abdomen is flat.     Palpations: Abdomen is soft.  Musculoskeletal:        General: No swelling. Normal range of motion.     Cervical back: Normal range of motion and neck supple.  Skin:    General: Skin is warm and dry.  Neurological:     General: No focal deficit present.     Mental Status: He is alert and oriented to person, place, and time.  Psychiatric:        Attention and Perception: He perceives auditory and visual hallucinations.        Mood and Affect: Mood is anxious. Affect is angry.        Speech: Speech normal.        Behavior: Behavior normal.        Thought Content: Thought content is paranoid. Thought content includes homicidal and suicidal ideation. Thought content includes suicidal plan.        Cognition and Memory: Cognition and memory normal.        Judgment: Judgment is inappropriate.   Review of Systems  Constitutional:  Positive for malaise/fatigue. Negative for fever.  HENT: Negative.     Eyes: Negative.   Respiratory: Negative.    Cardiovascular: Negative.   Gastrointestinal: Negative.   Genitourinary: Negative.   Musculoskeletal: Negative.   Skin: Negative.   Neurological:  Positive for headaches. Negative for weakness.  Endo/Heme/Allergies:  Positive for environmental allergies. Does not bruise/bleed easily.  Psychiatric/Behavioral:  Positive for depression, hallucinations, substance abuse and suicidal ideas. Negative for memory loss. The patient is nervous/anxious and has insomnia.   Blood pressure 133/84, pulse 67, temperature 98.2 F (36.8 C), temperature source Oral, resp. rate 18, height 5\' 11"  (1.803 m), weight 114.8 kg, SpO2 100 %. Body mass index is 35.29 kg/m.  Treatment Plan Summary: Daily contact with patient to assess and evaluate symptoms and progress in treatment and Medication management 35 year old male with chronic PTSD, major depressive disorder with psychotic features, and polysubstance abuse presenting for hallucinations, suicidal ideations, and homicidal ideations.  Continue Depakote 500 mg every 12 hours.  Check valproic acid level June 23.  Start Abilify 5 mg daily for hallucinations and mood and titrate as needed.  Remeron 7.5 mg nightly as needed for insomnia.  Patient abusing alcohol, marijuana, and cocaine.  Provide cessation counseling and outpatient substance abuse treatment.  Thiamine supplementation daily.  Observation Level/Precautions:  15 minute checks  Laboratory:   lipid panel, hemoglobin a1c  Psychotherapy:    Medications:    Consultations:    Discharge Concerns:    Estimated LOS:  Other:     Physician Treatment Plan for Primary Diagnosis: Severe recurrent major depression with psychotic features (HCC) Long Term Goal(s): Improvement in symptoms so as ready for discharge  Short Term Goals: Ability to identify changes in lifestyle to reduce recurrence of condition will improve, Ability to verbalize feelings will improve, Ability to  disclose and discuss suicidal ideas,  Ability to demonstrate self-control will improve, Ability to identify and develop effective coping behaviors will improve, Ability to maintain clinical measurements within normal limits will improve, Compliance with prescribed medications will improve, and Ability to identify triggers associated with substance abuse/mental health issues will improve  Physician Treatment Plan for Secondary Diagnosis: Principal Problem:   Severe recurrent major depression with psychotic features (HCC) Active Problems:   Posttraumatic stress disorder   Traumatic brain injury (HCC)   Cocaine dependence (HCC)   Marijuana abuse   Nicotine dependence   Severe alcohol dependence (HCC)  Long Term Goal(s): Improvement in symptoms so as ready for discharge  Short Term Goals: Ability to identify changes in lifestyle to reduce recurrence of condition will improve, Ability to verbalize feelings will improve, Ability to disclose and discuss suicidal ideas, Ability to demonstrate self-control will improve, Ability to identify and develop effective coping behaviors will improve, Ability to maintain clinical measurements within normal limits will improve, Compliance with prescribed medications will improve, and Ability to identify triggers associated with substance abuse/mental health issues will improve  I certify that inpatient services furnished can reasonably be expected to improve the patient's condition.    Jesse Sans, MD 6/21/202212:49 PM

## 2021-06-18 NOTE — [facility] Group Notes (Signed)
LCSW Group Therapy Note  06/18/2021 2:10 PM  Type of Therapy/Topic:  Group Therapy:  Feelings about Diagnosis  Participation Level:  Active   Description of Group:   This group will allow patients to explore their thoughts and feelings about diagnoses they have received. Patients will be guided to explore their level of understanding and acceptance of these diagnoses. Facilitator will encourage patients to process their thoughts and feelings about the reactions of others to their diagnosis and will guide patients in identifying ways to discuss their diagnosis with significant others in their lives. This group will be process-oriented, with patients participating in exploration of their own experiences, giving and receiving support, and processing challenge from other group members.   Therapeutic Goals: Patient will demonstrate understanding of diagnosis as evidenced by identifying two or more symptoms of the disorder Patient will be able to express two feelings regarding the diagnosis Patient will demonstrate their ability to communicate their needs through discussion and/or role play  Summary of Patient Progress: Patient was present for the entirety of group and was actively involved in the conversation. He shared the impact of his mental health within his life. Pt talked about the correlations between mental and physical health. He voiced his realization that he needs medication in combination with coping skills to survive in this world. His comments and feedback were pertinent to the discussion.   Therapeutic Modalities:   Cognitive Behavioral Therapy Brief Therapy Feelings Identification   Reilley Latorre R. Algis Greenhouse, MSW, LCSW, LCAS 06/18/2021 2:10 PM

## 2021-06-18 NOTE — Progress Notes (Signed)
Recreation Therapy Notes  Date: 06/18/2021   Time: 10:30 am   Location: Craft room     Behavioral response: N/A   Intervention Topic: Necessities    Discussion/Intervention: Patient did not attend group.   Clinical Observations/Feedback: Patient did not attend group.   Brityn Mastrogiovanni LRT/CTRS        [place] 06/18/2021 11:53 AM

## 2021-06-18 NOTE — [facility] Suicide Risk Assessment (Signed)
[facility] INPATIENT:  Family/Significant Other Suicide Prevention Education  Suicide Prevention Education:  Contact Attempts: [name] (name of family member/significant other) has been identified by the patient as the family member/significant other with whom the patient will be residing, and identified as the person(s) who will aid the patient in the event of a mental health crisis.  With written consent from the patient, two attempts were made to provide suicide prevention education, prior to and/or following the patient's discharge.  We were unsuccessful in providing suicide prevention education.  A suicide education pamphlet was given to the patient to share with family/significant other.  Date and time of first attempt:06/18/21/12:45pm Date and time of second attempt:/  Loula Marcella A [place] 06/18/2021, 12:44 PM

## 2021-06-19 DIAGNOSIS — F333 Major depressive disorder, recurrent, severe with psychotic symptoms: Secondary | ICD-10-CM | POA: Diagnosis not present

## 2021-06-19 LAB — LIPID PANEL
Cholesterol: 145 mg/dL (ref 0–200)
HDL: 60 mg/dL (ref >40)
LDL Cholesterol: 72 mg/dL (ref 0–99)
Total CHOL/HDL Ratio: 2.4 RATIO
Triglycerides: 63 mg/dL (ref <150)
VLDL: 13 mg/dL (ref 0–40)

## 2021-06-19 LAB — HEMOGLOBIN A1C
Hgb A1c MFr Bld: 5.8 % — ABNORMAL HIGH (ref 4.8–5.6)
Mean Plasma Glucose: 119.76 mg/dL

## 2021-06-19 NOTE — Progress Notes (Signed)
CSW contacted the [employer] 72-hour notification office in order to minimize financial impact on patient. Notification was submited on 06/19/2021 at 2:13 PM .   Confirmation number is [phone].   Signed:  Corky Crafts, MSW, [place], LCASA 06/19/2021 2:13 PM

## 2021-06-19 NOTE — Progress Notes (Addendum)
CSW met with patient to discuss housing resources available to [place] with [place] benefits. Patient shared that he met with a [facility] housing coordinator who told him that he makes too much money to be eligible for the program. The [place] reports that he is rated at 90% disability from the [place], receiving around $2100 monthly. Patient shared that he would like to go to the [employer] until he can find himself an apartment. Patient was not interested in boarding houses.   This patient, Jeremy Frank, was made aware that the [facility] is a privately operated shelter not affiliated with [employer] who requires its tennets to work 40 hours weekly, be able to climb to a top bunk, attend church sessions weekly, and remain on restriction for 7 days preventing the patient from leaving the facility. The Patient is also aware that they would need to complete an in-person interview, at which, the [facility] has the right to refuse admission and that transportation back would not be available. Patient is agreeable with these terms.   Patient is not psychiatrically clear at this time, this conversation was in anticipation of discharge.   Signed:  Durenda Hurt, MSW, [place], LCASA 06/19/2021 11:17 AM

## 2021-06-19 NOTE — Progress Notes (Signed)
Patient is pleasant and cooperative. Does not reveal much in conversation. Denies SI, HI and AVH

## 2021-06-19 NOTE — [facility] Group Notes (Signed)
LCSW Group Therapy Note  06/19/2021 11:10 AM  Type of Therapy/Topic:  Group Therapy:  Emotion Regulation  Participation Level:  Active   Description of Group:   The purpose of this group is to assist patients in learning to regulate negative emotions and experience positive emotions. Patients will be guided to discuss ways in which they have been vulnerable to their negative emotions. These vulnerabilities will be juxtaposed with experiences of positive emotions or situations, and patients will be challenged to use positive emotions to combat negative ones. Special emphasis will be placed on coping with negative emotions in conflict situations, and patients will process healthy conflict resolution skills.  Therapeutic Goals: Patient will identify two positive emotions or experiences to reflect on in order to balance out negative emotions Patient will label two or more emotions that they find the most difficult to experience Patient will demonstrate positive conflict resolution skills through discussion and/or role plays  Summary of Patient Progress: Patient was present for group.  Patient was appropriate and supportive of others.  Patient shared that anger was a emotion that led to his hospitalization.  He shared that he is unaware of his triggers to his emotions.  He shares that the only one that he is aware of is loud noises similar to the treble to music. He reports that he takes long walks as an effective coping mechanism.    Therapeutic Modalities:   Cognitive Behavioral Therapy Feelings Identification Dialectical Behavioral Therapy  Penni Homans, MSW, LCSW 06/19/2021 11:10 AM

## 2021-06-19 NOTE — Tx Team (Signed)
Interdisciplinary Treatment and Diagnostic Plan Update  06/19/2021 Time of Session: 0900 Jeremy Frank MRN: [id number]  Principal Diagnosis: Severe recurrent major depression with psychotic features [facility])  Secondary Diagnoses: Principal Problem:   Severe recurrent major depression with psychotic features (HCC) Active Problems:   Posttraumatic stress disorder   Traumatic brain injury (HCC)   Cocaine dependence (HCC)   Marijuana abuse   Nicotine dependence   Severe alcohol dependence (HCC)   Current Medications:  Current Facility-Administered Medications  Medication Dose Route Frequency Provider Last Rate Last Admin   acetaminophen (TYLENOL) tablet 650 mg  650 mg Oral Q6H PRN Clapacs, Jackquline Denmark, MD       alum & mag hydroxide-simeth (MAALOX/MYLANTA) 200-200-20 MG/5ML suspension 30 mL  30 mL Oral Q4H PRN Clapacs, John T, MD       ARIPiprazole (ABILIFY) tablet 5 mg  5 mg Oral Daily Jesse Sans, MD   5 mg at 06/19/21 0806   divalproex (DEPAKOTE) DR tablet 500 mg  500 mg Oral Q12H Clapacs, Jackquline Denmark, MD   500 mg at 06/19/21 6010   gabapentin (NEURONTIN) capsule 100 mg  100 mg Oral TID Clapacs, Jackquline Denmark, MD   100 mg at 06/19/21 9323   hydrOXYzine (ATARAX/VISTARIL) tablet 25 mg  25 mg Oral Q6H PRN Clapacs, Jackquline Denmark, MD       loperamide (IMODIUM) capsule 2-4 mg  2-4 mg Oral PRN Clapacs, Jackquline Denmark, MD       magnesium hydroxide (MILK OF MAGNESIA) suspension 30 mL  30 mL Oral Daily PRN Clapacs, John T, MD       mirtazapine (REMERON) tablet 7.5 mg  7.5 mg Oral QHS PRN Jesse Sans, MD       multivitamin with minerals tablet 1 tablet  1 tablet Oral Daily Clapacs, Jackquline Denmark, MD   1 tablet at 06/19/21 0806   ondansetron (ZOFRAN-ODT) disintegrating tablet 4 mg  4 mg Oral Q6H PRN Clapacs, Jackquline Denmark, MD       thiamine tablet 100 mg  100 mg Oral Daily Clapacs, John T, MD   100 mg at 06/19/21 5573   PTA Medications: Medications Prior to Admission  Medication Sig Dispense Refill Last Dose   divalproex  (DEPAKOTE) 500 MG DR tablet Take 1 tablet (500 mg total) by mouth every 12 (twelve) hours. (Patient not taking: No sig reported) 60 tablet 1    gabapentin (NEURONTIN) 300 MG capsule Take 300 mg by mouth 3 (three) times daily. (Patient not taking: No sig reported)      omeprazole (PRILOSEC) 40 MG capsule Take 40 mg by mouth daily. (Patient not taking: No sig reported)       Patient Stressors: Paediatric nurse issue Marital or family conflict Other: homeless  Patient Strengths: Barrister's clerk for treatment/growth  Treatment Modalities: Medication Management, Group therapy, Case management,  1 to 1 session with clinician, Psychoeducation, Recreational therapy.   Physician Treatment Plan for Primary Diagnosis: Severe recurrent major depression with psychotic features (HCC) Long Term Goal(s): Improvement in symptoms so as ready for discharge   Short Term Goals: Ability to identify changes in lifestyle to reduce recurrence of condition will improve Ability to verbalize feelings will improve Ability to disclose and discuss suicidal ideas Ability to demonstrate self-control will improve Ability to identify and develop effective coping behaviors will improve Ability to maintain clinical measurements within normal limits will improve Compliance with prescribed medications will improve Ability to identify triggers associated with substance abuse/mental health issues will  improve  Medication Management: Evaluate patient's response, side effects, and tolerance of medication regimen.  Therapeutic Interventions: 1 to 1 sessions, Unit Group sessions and Medication administration.  Evaluation of Outcomes: Progressing  Physician Treatment Plan for Secondary Diagnosis: Principal Problem:   Severe recurrent major depression with psychotic features (HCC) Active Problems:   Posttraumatic stress disorder   Traumatic brain injury (HCC)   Cocaine dependence (HCC)    Marijuana abuse   Nicotine dependence   Severe alcohol dependence (HCC)  Long Term Goal(s): Improvement in symptoms so as ready for discharge   Short Term Goals: Ability to identify changes in lifestyle to reduce recurrence of condition will improve Ability to verbalize feelings will improve Ability to disclose and discuss suicidal ideas Ability to demonstrate self-control will improve Ability to identify and develop effective coping behaviors will improve Ability to maintain clinical measurements within normal limits will improve Compliance with prescribed medications will improve Ability to identify triggers associated with substance abuse/mental health issues will improve     Medication Management: Evaluate patient's response, side effects, and tolerance of medication regimen.  Therapeutic Interventions: 1 to 1 sessions, Unit Group sessions and Medication administration.  Evaluation of Outcomes: Progressing   RN Treatment Plan for Primary Diagnosis: Severe recurrent major depression with psychotic features (HCC) Long Term Goal(s): Knowledge of disease and therapeutic regimen to maintain health will improve  Short Term Goals: Ability to remain free from injury will improve, Ability to verbalize frustration and anger appropriately will improve, Ability to demonstrate self-control, Ability to participate in decision making will improve, Ability to verbalize feelings will improve, Ability to disclose and discuss suicidal ideas, Ability to identify and develop effective coping behaviors will improve, and Compliance with prescribed medications will improve  Medication Management: RN will administer medications as ordered by provider, will assess and evaluate patient's response and provide education to patient for prescribed medication. RN will report any adverse and/or side effects to prescribing provider.  Therapeutic Interventions: 1 on 1 counseling sessions, Psychoeducation, Medication  administration, Evaluate responses to treatment, Monitor vital signs and CBGs as ordered, Perform/monitor CIWA, COWS, AIMS and Fall Risk screenings as ordered, Perform wound care treatments as ordered.  Evaluation of Outcomes: Progressing   LCSW Treatment Plan for Primary Diagnosis: Severe recurrent major depression with psychotic features (HCC) Long Term Goal(s): Safe transition to appropriate next level of care at discharge, Engage patient in therapeutic group addressing interpersonal concerns.  Short Term Goals: Engage patient in aftercare planning with referrals and resources, Increase social support, Increase ability to appropriately verbalize feelings, Increase emotional regulation, Facilitate acceptance of mental health diagnosis and concerns, Facilitate patient progression through stages of change regarding substance use diagnoses and concerns, Identify triggers associated with mental health/substance abuse issues, and Increase skills for wellness and recovery  Therapeutic Interventions: Assess for all discharge needs, 1 to 1 time with Social worker, Explore available resources and support systems, Assess for adequacy in community support network, Educate family and significant other(s) on suicide prevention, Complete Psychosocial Assessment, Interpersonal group therapy.  Evaluation of Outcomes: Progressing   Progress in Treatment: Attending groups: No. Participating in groups: No. Taking medication as prescribed: Yes. Toleration medication: Yes. Family/Significant other contact made: Yes, individual(s) contacted:  SPE attempted with Robby Sermon, Parole Officer  Patient understands diagnosis: Yes. Discussing patient identified problems/goals with staff: Yes. Medical problems stabilized or resolved: Yes. Denies suicidal/homicidal ideation: No. Issues/concerns per patient self-inventory: Yes. Other: none  New problem(s) identified: Yes, Describe:  Shared homicidal and suicidal  ideation  which has subsided since hospitalization.   New Short Term/Long Term Goal(s): detox, elimination of symptoms of psychosis, medication management for mood stabilization; elimination of SI thoughts; development of comprehensive mental wellness/sobriety plan.   Patient Goals:  "not to feel homicidal or suicidal . . . Go home and self medicate"   Discharge Plan or Barriers: Patient shared intent to return to community and continue illicit drug use to "self medicate" and expressed resistance to treatment.   Reason for Continuation of Hospitalization: Aggression Anxiety Depression Suicidal ideation  Estimated Length of Stay:  Attendees: Patient: Jeremy Frank 06/19/2021 9:06 AM  Physician: Les Pou, MD 06/19/2021 9:06 AM  Nursing: Adela Glimpse, RN 06/19/2021 9:06 AM  RN Care Manager:  06/19/2021 9:06 AM  Social Worker: Gwenevere Ghazi, MSW, [place], Bridget Hartshorn  06/19/2021 9:06 AM  Recreational Therapist:  06/19/2021 9:06 AM  Other: Penni Homans, MSW, LCSW 06/19/2021 9:06 AM  Other: Jillyn Hidden, MSW, LCSW, LCAS  06/19/2021 9:06 AM  Other:Kiva [place], [name] 06/19/2021 9:06 AM    Scribe for Treatment Team: Corky Crafts, LCSWA 06/19/2021 9:06 AM

## 2021-06-19 NOTE — Plan of Care (Signed)
  Problem: Education: Goal: Ability to state activities that reduce stress will improve Outcome: Progressing   Problem: Coping: Goal: Ability to identify and develop effective coping behavior will improve Outcome: Progressing   Problem: Self-Concept: Goal: Ability to identify factors that promote anxiety will improve Outcome: Progressing Goal: Level of anxiety will decrease Outcome: Progressing Goal: Ability to modify response to factors that promote anxiety will improve Outcome: Progressing   Problem: Education: Goal: Knowledge of [facility] Education information/materials will improve Outcome: Progressing Goal: Emotional status will improve Outcome: Progressing Goal: Mental status will improve Outcome: Progressing Goal: Verbalization of understanding the information provided will improve Outcome: Progressing   Problem: Activity: Goal: Interest or engagement in activities will improve Outcome: Progressing Goal: Sleeping patterns will improve Outcome: Progressing

## 2021-06-19 NOTE — Plan of Care (Signed)
  Problem: Education: Goal: Ability to state activities that reduce stress will improve Outcome: Progressing   Problem: Coping: Goal: Ability to identify and develop effective coping behavior will improve Outcome: Progressing   Problem: Self-Concept: Goal: Ability to identify factors that promote anxiety will improve Outcome: Progressing Goal: Level of anxiety will decrease Outcome: Progressing Goal: Ability to modify response to factors that promote anxiety will improve Outcome: Progressing   Problem: Education: Goal: Knowledge of  General Education information/materials will improve Outcome: Progressing Goal: Emotional status will improve Outcome: Progressing Goal: Mental status will improve Outcome: Progressing Goal: Verbalization of understanding the information provided will improve Outcome: Progressing   Problem: Activity: Goal: Interest or engagement in activities will improve Outcome: Progressing Goal: Sleeping patterns will improve Outcome: Progressing   Problem: Coping: Goal: Ability to verbalize frustrations and anger appropriately will improve Outcome: Progressing Goal: Ability to demonstrate self-control will improve Outcome: Progressing   Problem: Health Behavior/Discharge Planning: Goal: Identification of resources available to assist in meeting health care needs will improve Outcome: Progressing Goal: Compliance with treatment plan for underlying cause of condition will improve Outcome: Progressing   Problem: Physical Regulation: Goal: Ability to maintain clinical measurements within normal limits will improve Outcome: Progressing   Problem: Safety: Goal: Periods of time without injury will increase Outcome: Progressing   Problem: Education: Goal: Utilization of techniques to improve thought processes will improve Outcome: Progressing Goal: Knowledge of the prescribed therapeutic regimen will improve Outcome: Progressing   Problem:  Activity: Goal: Interest or engagement in leisure activities will improve Outcome: Progressing Goal: Imbalance in normal sleep/wake cycle will improve Outcome: Progressing   Problem: Coping: Goal: Coping ability will improve Outcome: Progressing Goal: Will verbalize feelings Outcome: Progressing   Problem: Health Behavior/Discharge Planning: Goal: Ability to make decisions will improve Outcome: Progressing Goal: Compliance with therapeutic regimen will improve Outcome: Progressing   Problem: Role Relationship: Goal: Will demonstrate positive changes in social behaviors and relationships Outcome: Progressing   Problem: Safety: Goal: Ability to disclose and discuss suicidal ideas will improve Outcome: Progressing Goal: Ability to identify and utilize support systems that promote safety will improve Outcome: Progressing   Problem: Self-Concept: Goal: Will verbalize positive feelings about self Outcome: Progressing Goal: Level of anxiety will decrease Outcome: Progressing   

## 2021-06-19 NOTE — [facility] Suicide Risk Assessment (Signed)
[facility] INPATIENT:  Family/Significant Other Suicide Prevention Education  Suicide Prevention Education:  Contact Attempts: [name] (name of family member/significant other) has been identified by the patient as the family member/significant other with whom the patient will be residing, and identified as the person(s) who will aid the patient in the event of a mental health crisis.  With written consent from the patient, two attempts were made to provide suicide prevention education, prior to and/or following the patient's discharge.  We were unsuccessful in providing suicide prevention education.  A suicide education pamphlet was given to the patient to share with family/significant other.  Date and time of first attempt:06/18/21/12:45pm Date and time of second attempt:/06/19/21/ 11:15am  Jeremy Frank 06/19/2021, 1:09 PM

## 2021-06-19 NOTE — Progress Notes (Signed)
Patient presents with bizarre affect this morning. He states that he feels the medication is helping and he thinks Abilify is working for him. He says the violent thoughts are lessening. However, he endorses suicidal ideations and hopelessness. He feels he has no support. Patient says that he thinks he will leave the hospital and get high. However, per MD note, after additional discussion, patient states that he feels he wants to do better and is open to hearing about residential substance abuse treatment programs.  Patient is medication compliant. He is observed to be interacting appropriately with staff and other patients on the unit. Support and encouragement provided. Patient remains safe on the unit at this time.

## 2021-06-19 NOTE — Progress Notes (Signed)
[facility] MD Progress Note  06/19/2021 12:32 PM Jeremy Frank  MRN:  [id number]  CC "Can't wait to step over my mama's grave."  Subjective:  35 year old male with PTSD, depression, and polysubstance abuse presenting for worsening hallucinations, suicidal ideations, and homicidal ideations.  Patient seen during treatment team and again one-on-one at bedside.  He reports that he is still feeling very hopeless, and unloved.  He notes that he reached out to his mom who then stopped excepting his phone calls.  For him this means he has no one in his corner no lumbar support.  He is feeling both suicidal and homicidal today.  He continues to have visual hallucinations of men dressed in [employer] as well as auditory hallucinations.  He does feel the combination of Depakote and Abilify are coming more helpful.  He does note that he is having a strong craving for drugs and alcohol.  At first he notes that he just wants to leave the hospital so he can go and pick up drugs and alcohol to self medicate.  However after discussion and some supportive psychotherapy he does note that drugs and alcohol have done nothing but worsen his mental health symptoms.  He does feel that he wants to do better.  He is open to hearing about residential substance abuse treatment programs from her Child psychotherapist.  At this time he is most interested in going to the [facility] at discharge and [facility] for residential substance abuse treatment for [facility].  Principal Problem: Severe recurrent major depression with psychotic features (HCC) Diagnosis: Principal Problem:   Severe recurrent major depression with psychotic features (HCC) Active Problems:   Posttraumatic stress disorder   Traumatic brain injury (HCC)   Cocaine dependence (HCC)   Marijuana abuse   Nicotine dependence   Severe alcohol dependence (HCC)  Total Time spent with patient: 30 minutes  Past Psychiatric History: See H&P  Past  Medical History:  Past Medical History:  Diagnosis Date   Drug dependence (HCC)    GI bleed    Hiatal hernia    Hypertension    PTSD (post-traumatic stress disorder)     Past Surgical History:  Procedure Laterality Date   KNEE SURGERY     Family History: History reviewed. No pertinent family history. Family Psychiatric  History: See H&P Social History:  Social History   Substance and Sexual Activity  Alcohol Use Yes   Alcohol/week: 42.0 standard drinks   Types: 42 Cans of beer per week   Comment: per pt he drinks 6-12 cans of beer daily     Social History   Substance and Sexual Activity  Drug Use Yes   Types: Cocaine, Marijuana    Social History   Socioeconomic History   Marital status: Single    Spouse name: Not on file   Number of children: Not on file   Years of education: Not on file   Highest education level: Not on file  Occupational History   Not on file  Tobacco Use   Smoking status: Every Day    Packs/day: 0.25    Pack years: 0.00    Types: Cigarettes   Smokeless tobacco: Never  Vaping Use   Vaping Use: Never used  Substance and Sexual Activity   Alcohol use: Yes    Alcohol/week: 42.0 standard drinks    Types: 42 Cans of beer per week    Comment: per pt he drinks 6-12 cans of beer daily  Drug use: Yes    Types: Cocaine, Marijuana   Sexual activity: Not on file  Other Topics Concern   Not on file  Social History Narrative   Not on file   Social Determinants of Health   Financial Resource Strain: Not on file  Food Insecurity: Not on file  Transportation Needs: Not on file  Physical Activity: Not on file  Stress: Not on file  Social Connections: Not on file   Additional Social History:                         Sleep: Fair  Appetite:  Fair  Current Medications: Current Facility-Administered Medications  Medication Dose Route Frequency Provider Last Rate Last Admin   acetaminophen (TYLENOL) tablet 650 mg  650 mg Oral Q6H  PRN Clapacs, Jackquline Denmark, MD       alum & mag hydroxide-simeth (MAALOX/MYLANTA) 200-200-20 MG/5ML suspension 30 mL  30 mL Oral Q4H PRN Clapacs, Jackquline Denmark, MD       ARIPiprazole (ABILIFY) tablet 5 mg  5 mg Oral Daily Jesse Sans, MD   5 mg at 06/19/21 0806   divalproex (DEPAKOTE) DR tablet 500 mg  500 mg Oral Q12H Clapacs, Jackquline Denmark, MD   500 mg at 06/19/21 7371   gabapentin (NEURONTIN) capsule 100 mg  100 mg Oral TID Clapacs, Jackquline Denmark, MD   100 mg at 06/19/21 1209   hydrOXYzine (ATARAX/VISTARIL) tablet 25 mg  25 mg Oral Q6H PRN Clapacs, Jackquline Denmark, MD       loperamide (IMODIUM) capsule 2-4 mg  2-4 mg Oral PRN Clapacs, Jackquline Denmark, MD       magnesium hydroxide (MILK OF MAGNESIA) suspension 30 mL  30 mL Oral Daily PRN Clapacs, Jackquline Denmark, MD       mirtazapine (REMERON) tablet 7.5 mg  7.5 mg Oral QHS PRN Jesse Sans, MD       multivitamin with minerals tablet 1 tablet  1 tablet Oral Daily Clapacs, Jackquline Denmark, MD   1 tablet at 06/19/21 0806   ondansetron (ZOFRAN-ODT) disintegrating tablet 4 mg  4 mg Oral Q6H PRN Clapacs, Jackquline Denmark, MD       thiamine tablet 100 mg  100 mg Oral Daily Clapacs, Jackquline Denmark, MD   100 mg at 06/19/21 0626    Lab Results:  Results for orders placed or performed during the hospital encounter of 06/17/21 (from the past 48 hour(s))  Lipid panel     Status: None   Collection Time: 06/19/21  6:19 AM  Result Value Ref Range   Cholesterol 145 0 - 200 mg/dL   Triglycerides 63 <948 mg/dL   HDL 60 >54 mg/dL   Total CHOL/HDL Ratio 2.4 RATIO   VLDL 13 0 - 40 mg/dL   LDL Cholesterol 72 0 - 99 mg/dL    Comment:        Total Cholesterol/HDL:CHD Risk Coronary Heart Disease Risk Table                     Men   Women  1/2 Average Risk   3.4   3.3  Average Risk       5.0   4.4  2 X Average Risk   9.6   7.1  3 X Average Risk  23.4   11.0        Use the calculated Patient Ratio above and the CHD Risk Table to determine the patient's CHD Risk.  ATP III CLASSIFICATION (LDL):  <100     mg/dL    Optimal  366-440  mg/dL   Near or Above                    Optimal  130-159  mg/dL   Borderline  347-425  mg/dL   High  >956     mg/dL   Very High Performed at [facility], [address] [place], [place] [address]   Hemoglobin A1c     Status: Abnormal   Collection Time: 06/19/21  6:19 AM  Result Value Ref Range   Hgb A1c MFr Bld 5.8 (H) 4.8 - 5.6 %    Comment: (NOTE) Pre diabetes:          5.7%-6.4%  Diabetes:              >6.4%  Glycemic control for   <7.0% adults with diabetes    Mean Plasma Glucose 119.76 mg/dL    Comment: Performed at [facility], [address] [place], [place] [address]    Blood Alcohol level:  Lab Results  Component Value Date   ETH 88 (H) 06/16/2021   ETH 49 (H) 03/13/2021    Metabolic Disorder Labs: Lab Results  Component Value Date   HGBA1C 5.8 (H) 06/19/2021   MPG 119.76 06/19/2021   No results found for: PROLACTIN Lab Results  Component Value Date   CHOL 145 06/19/2021   TRIG 63 06/19/2021   HDL 60 06/19/2021   CHOLHDL 2.4 06/19/2021   VLDL 13 06/19/2021   LDLCALC 72 06/19/2021    Physical Findings: AIMS:  , ,  ,  ,    CIWA:    COWS:     Musculoskeletal: Strength & Muscle Tone: within normal limits Gait & Station: normal Patient leans: N/A  Psychiatric Specialty Exam:  Presentation  General Appearance: Casual  Eye Contact:Minimal  Speech:Clear and Coherent  Speech Volume:Normal  Handedness:Right   Mood and Affect  Mood:Anxious; Irritable  Affect:Congruent   Thought Process  Thought Processes:Coherent  Descriptions of Associations:Intact  Orientation:Full (Time, Place and Person)  Thought Content:Paranoid Ideation  History of Schizophrenia/Schizoaffective disorder:No  Duration of Psychotic Symptoms:Greater than six months  Hallucinations:Hallucinations: Auditory; Visual  Ideas of Reference:Paranoia  Suicidal Thoughts:Suicidal Thoughts: Yes, Active  Homicidal  Thoughts:Homicidal Thoughts: Yes, Passive   Sensorium  Memory:Immediate Good; Remote Fair; Recent Fair  Judgment:Impaired  Insight:Present   Executive Functions  Concentration:Fair  Attention Span:Fair  Recall:Fair  Fund of Knowledge:Fair  Language:Fair   Psychomotor Activity  Psychomotor Activity:Psychomotor Activity: Normal   Assets  Assets:Desire for Improvement; Financial Resources/Insurance; Resilience   Sleep  Sleep:Sleep: Fair Number of Hours of Sleep: 8    Physical Exam: Physical Exam ROS Blood pressure 134/89, pulse (!) 59, temperature 98.5 F (36.9 C), temperature source Oral, resp. rate 18, height 5\' 11"  (1.803 m), weight 114.8 kg, SpO2 100 %. Body mass index is 35.29 kg/m.   Treatment Plan Summary: Daily contact with patient to assess and evaluate symptoms and progress in treatment and Medication management47 year old male with chronic PTSD, major depressive disorder with psychotic features, and polysubstance abuse presenting for hallucinations, suicidal ideations, and homicidal ideations.  Continue Depakote 500 mg every 12 hours.  Check valproic acid level June 23.  Continue Abilify 5 mg daily for hallucinations and mood and titrate as needed.  Remeron 7.5 mg nightly as needed for insomnia.  Patient abusing alcohol, marijuana, and cocaine.  Provide cessation counseling and outpatient substance  abuse treatment.  Thiamine supplementation daily.  Jesse Sans, MD 06/19/2021, 12:32 PM

## 2021-06-20 DIAGNOSIS — F333 Major depressive disorder, recurrent, severe with psychotic symptoms: Secondary | ICD-10-CM | POA: Diagnosis not present

## 2021-06-20 LAB — VALPROIC ACID LEVEL: Valproic Acid Lvl: 49 ug/mL — ABNORMAL LOW (ref 50.0–100.0)

## 2021-06-20 MED ORDER — ARIPIPRAZOLE 10 MG PO TABS
10.0000 mg | ORAL_TABLET | Freq: Every day | ORAL | Status: DC
  Administered 2021-06-21 – 2021-06-24 (×4): 10 mg via ORAL
  Filled 2021-06-20 (×4): qty 1

## 2021-06-20 NOTE — Progress Notes (Signed)
Patient is quiet and reserved. He appears to be preoccupied and responding to internal stimuli, but denies.  He is med compliant and received his meds without incident.  He is active on the unit and gets along well with other male patients.  He reports being more hopeful today. Support and encouragement provided. Will continue to monitor with Q15 minute safety rounds.     Jeremy Butler-Nicholson, LPN

## 2021-06-20 NOTE — Progress Notes (Signed)
Recreation Therapy Notes  INPATIENT RECREATION THERAPY ASSESSMENT  Patient Details Name: DAMARIOUS HOLTSCLAW MRN: [id number] DOB: June 28, 1986 Today's Date: 06/20/2021       Information Obtained From: Patient  Able to Participate in Assessment/Interview: Yes  Patient Presentation: Responsive, Resistant, Withdrawn  Reason for Admission (Per Patient): Active Symptoms  Patient Stressors:    Coping Skills:   Art, Film/video editor, Avoidance  Leisure Interests (2+):  Art - Paint, Art - Coloring, Art - Draw  Frequency of Recreation/Participation: Monthly  Awareness of Community Resources:  No  Community Resources:     Current Use:    If no, Barriers?:    Expressed Interest in [employer] Information: No  County of Residence:  Film/video editor  Patient Main Form of Transportation: Therapist, music  Patient Strengths:  Controlling my anger  Patient Identified Areas of Improvement:  Not sure  Patient Goal for Hospitalization:  Medication adjustment  Current SI (including self-harm):  Yes (No plan)  Current HI:  No  Current AVH: Yes  Staff Intervention Plan: Collaborate with Interdisciplinary Treatment Team, Group Attendance  Consent to Intern Participation: N/A  Daniell Mancinas 06/20/2021, 3:47 PM

## 2021-06-20 NOTE — Progress Notes (Signed)
Recreation Therapy Notes  INPATIENT RECREATION TR PLAN  Patient Details Name: Jeremy Frank MRN: [id number] DOB: Aug 30, 1986 Today's Date: 06/20/2021  Rec Therapy Plan Is patient appropriate for Therapeutic Recreation?: Yes Treatment times per week: at least 3 Estimated Length of Stay: 5-7 days TR Treatment/Interventions: Group participation (Comment)  Discharge Criteria Pt will be discharged from therapy if:: Discharged Treatment plan/goals/alternatives discussed and agreed upon by:: Patient/family  Discharge Summary     Genesi Stefanko 06/20/2021, 3:47 PM

## 2021-06-20 NOTE — [facility] Group Notes (Signed)
LCSW Group Therapy Note  06/20/2021 1:41 PM  Type of Therapy/Topic:  Group Therapy:  Balance in Life  Participation Level:  Did Not Attend  Description of Group:    This group will address the concept of balance and how it feels and looks when one is unbalanced. Patients will be encouraged to process areas in their lives that are out of balance and identify reasons for remaining unbalanced. Facilitators will guide patients in utilizing problem-solving interventions to address and correct the stressor making their life unbalanced. Understanding and applying boundaries will be explored and addressed for obtaining and maintaining a balanced life. Patients will be encouraged to explore ways to assertively make their unbalanced needs known to significant others in their lives, using other group members and facilitator for support and feedback.  Therapeutic Goals: Patient will identify two or more emotions or situations they have that consume much of in their lives. Patient will identify signs/triggers that life has become out of balance:  Patient will identify two ways to set boundaries in order to achieve balance in their lives:  Patient will demonstrate ability to communicate their needs through discussion and/or role plays  Summary of Patient Progress: Patient did not attend group despite encouraged participation.    Therapeutic Modalities:   Cognitive Behavioral Therapy Solution-Focused Therapy Assertiveness Training  Javaris Wigington, MSW, LCSWA, LCASA 06/20/2021 1:41 PM 

## 2021-06-20 NOTE — Progress Notes (Signed)
Recreation Therapy Notes  Date: 06/20/2020  Time: 9:30 am   Location: Craft room   Behavioral response: N/A   Intervention Topic: Animal Assisted therapy    Discussion/Intervention: Patient did not attend group.   Clinical Observations/Feedback:  Patient did not attend group.   Romond Pipkins LRT/CTRS        Shakeera Rightmyer 06/20/2021 11:45 AM

## 2021-06-20 NOTE — Progress Notes (Signed)
Pt is quiet but cooperative and medication. Pt endorses AVH, SI, and HI but has not acted on what he is hearing at this time. He is active on the unit and gets along well with other male patients.  He reports being more hopeful today. Support and encouragement provided. Will continue to monitor with Q15 minute safety rounds.

## 2021-06-20 NOTE — Progress Notes (Signed)
[facility] MD Progress Note  06/20/2021 11:58 AM Jeremy Frank  MRN:  [id number]  CC "I'm tired."  Subjective:  35 year old male with PTSD, depression, and polysubstance abuse presenting for worsening hallucinations, suicidal ideations, and homicidal ideations.  Patient seen one-on-one again today.  He reports feeling very tired today.  He notes that his mood is somewhat improved from the Depakote and Abilify.  He was having suicidal ideations this morning, but notes that they have lessened at time of interview.  He denies any active homicidal ideations.  Continues to have auditory hallucinations as well as visual hallucinations of people in [employer] garb trying to hurt him.  He is less bizarre on exam today.  Principal Problem: Severe recurrent major depression with psychotic features (HCC) Diagnosis: Principal Problem:   Severe recurrent major depression with psychotic features (HCC) Active Problems:   Posttraumatic stress disorder   Traumatic brain injury (HCC)   Cocaine dependence (HCC)   Marijuana abuse   Nicotine dependence   Severe alcohol dependence (HCC)  Total Time spent with patient: 30 minutes  Past Psychiatric History: See H&P  Past Medical History:  Past Medical History:  Diagnosis Date   Drug dependence (HCC)    GI bleed    Hiatal hernia    Hypertension    PTSD (post-traumatic stress disorder)     Past Surgical History:  Procedure Laterality Date   KNEE SURGERY     Family History: History reviewed. No pertinent family history. Family Psychiatric  History: See H&P Social History:  Social History   Substance and Sexual Activity  Alcohol Use Yes   Alcohol/week: 42.0 standard drinks   Types: 42 Cans of beer per week   Comment: per pt he drinks 6-12 cans of beer daily     Social History   Substance and Sexual Activity  Drug Use Yes   Types: Cocaine, Marijuana    Social History   Socioeconomic History   Marital status: Single    Spouse name: Not on file    Number of children: Not on file   Years of education: Not on file   Highest education level: Not on file  Occupational History   Not on file  Tobacco Use   Smoking status: Every Day    Packs/day: 0.25    Pack years: 0.00    Types: Cigarettes   Smokeless tobacco: Never  Vaping Use   Vaping Use: Never used  Substance and Sexual Activity   Alcohol use: Yes    Alcohol/week: 42.0 standard drinks    Types: 42 Cans of beer per week    Comment: per pt he drinks 6-12 cans of beer daily   Drug use: Yes    Types: Cocaine, Marijuana   Sexual activity: Not on file  Other Topics Concern   Not on file  Social History Narrative   Not on file   Social Determinants of Health   Financial Resource Strain: Not on file  Food Insecurity: Not on file  Transportation Needs: Not on file  Physical Activity: Not on file  Stress: Not on file  Social Connections: Not on file   Additional Social History:                         Sleep: Fair  Appetite:  Fair  Current Medications: Current Facility-Administered Medications  Medication Dose Route Frequency Provider Last Rate Last Admin   acetaminophen (TYLENOL) tablet 650 mg  650 mg Oral Q6H PRN Clapacs,  Jackquline Denmark, MD       alum & mag hydroxide-simeth (MAALOX/MYLANTA) 200-200-20 MG/5ML suspension 30 mL  30 mL Oral Q4H PRN Clapacs, Jackquline Denmark, MD       [START ON 06/21/2021] ARIPiprazole (ABILIFY) tablet 10 mg  10 mg Oral Daily Jesse Sans, MD       divalproex (DEPAKOTE) DR tablet 500 mg  500 mg Oral Q12H Clapacs, Jackquline Denmark, MD   500 mg at 06/20/21 0751   gabapentin (NEURONTIN) capsule 100 mg  100 mg Oral TID Clapacs, Jackquline Denmark, MD   100 mg at 06/20/21 1937   magnesium hydroxide (MILK OF MAGNESIA) suspension 30 mL  30 mL Oral Daily PRN Clapacs, Jackquline Denmark, MD       mirtazapine (REMERON) tablet 7.5 mg  7.5 mg Oral QHS PRN Jesse Sans, MD   7.5 mg at 06/19/21 2050   multivitamin with minerals tablet 1 tablet  1 tablet Oral Daily Clapacs, Jackquline Denmark, MD    1 tablet at 06/20/21 9024   thiamine tablet 100 mg  100 mg Oral Daily Clapacs, Jackquline Denmark, MD   100 mg at 06/20/21 0973    Lab Results:  Results for orders placed or performed during the hospital encounter of 06/17/21 (from the past 48 hour(s))  Lipid panel     Status: None   Collection Time: 06/19/21  6:19 AM  Result Value Ref Range   Cholesterol 145 0 - 200 mg/dL   Triglycerides 63 <532 mg/dL   HDL 60 >99 mg/dL   Total CHOL/HDL Ratio 2.4 RATIO   VLDL 13 0 - 40 mg/dL   LDL Cholesterol 72 0 - 99 mg/dL    Comment:        Total Cholesterol/HDL:CHD Risk Coronary Heart Disease Risk Table                     Men   Women  1/2 Average Risk   3.4   3.3  Average Risk       5.0   4.4  2 X Average Risk   9.6   7.1  3 X Average Risk  23.4   11.0        Use the calculated Patient Ratio above and the CHD Risk Table to determine the patient's CHD Risk.        ATP III CLASSIFICATION (LDL):  <100     mg/dL   Optimal  242-683  mg/dL   Near or Above                    Optimal  130-159  mg/dL   Borderline  419-622  mg/dL   High  >297     mg/dL   Very High Performed at [facility], [address] [place], [place] [address]   Hemoglobin A1c     Status: Abnormal   Collection Time: 06/19/21  6:19 AM  Result Value Ref Range   Hgb A1c MFr Bld 5.8 (H) 4.8 - 5.6 %    Comment: (NOTE) Pre diabetes:          5.7%-6.4%  Diabetes:              >6.4%  Glycemic control for   <7.0% adults with diabetes    Mean Plasma Glucose 119.76 mg/dL    Comment: Performed at [facility], [address], [place], [place] [address]  Valproic acid level     Status: Abnormal  Collection Time: 06/20/21  7:00 AM  Result Value Ref Range   Valproic Acid Lvl 49 (L) 50.0 - 100.0 ug/mL    Comment: Performed at [facility], [address] [place], [place] [address]    Blood Alcohol level:  Lab Results  Component Value Date   ETH 88 (H) 06/16/2021   ETH 49 (H) 03/13/2021    Metabolic  Disorder Labs: Lab Results  Component Value Date   HGBA1C 5.8 (H) 06/19/2021   MPG 119.76 06/19/2021   No results found for: PROLACTIN Lab Results  Component Value Date   CHOL 145 06/19/2021   TRIG 63 06/19/2021   HDL 60 06/19/2021   CHOLHDL 2.4 06/19/2021   VLDL 13 06/19/2021   LDLCALC 72 06/19/2021    Physical Findings: AIMS:  , ,  ,  ,    CIWA:    COWS:     Musculoskeletal: Strength & Muscle Tone: within normal limits Gait & Station: normal Patient leans: N/A  Psychiatric Specialty Exam:  Presentation  General Appearance: Casual  Eye Contact:Minimal  Speech:Clear and Coherent  Speech Volume:Normal  Handedness:Right   Mood and Affect  Mood:Anxious; Irritable  Affect:Congruent   Thought Process  Thought Processes:Coherent  Descriptions of Associations:Intact  Orientation:Full (Time, Place and Person)  Thought Content:Paranoid Ideation  History of Schizophrenia/Schizoaffective disorder:No  Duration of Psychotic Symptoms:Greater than six months  Hallucinations:AH and VH  Ideas of Reference:Paranoia  Suicidal Thoughts:Yes, active  Homicidal Thoughts:Denies   Sensorium  Memory:Immediate Good; Remote Fair; Recent Fair  Judgment:Impaired  Insight:Present   Executive Functions  Concentration:Fair  Attention Span:Fair  Recall:Fair  Fund of Knowledge:Fair  Language:Fair   Psychomotor Activity  Psychomotor Activity:normal   Assets  Assets:Desire for Improvement; Financial Resources/Insurance; Resilience   Sleep  Sleep:Good, 8.15    Physical Exam: Physical Exam ROS Blood pressure 139/87, pulse 66, temperature 98.5 F (36.9 C), temperature source Oral, resp. rate 18, height 5\' 11"  (1.803 m), weight 114.8 kg, SpO2 98 %. Body mass index is 35.29 kg/m.   Treatment Plan Summary: Daily contact with patient to assess and evaluate symptoms and progress in treatment and Medication management67 year old male with chronic PTSD,  major depressive disorder with psychotic features, and polysubstance abuse presenting for hallucinations, suicidal ideations, and homicidal ideations.  Continue Depakote 500 mg every 12 hours. Valproic acid level somewhat subtherapeutic at 49, however, clinically appears somnolent so will not increase. Increase Abilify 10 mg daily for hallucinations and mood and titrate as needed.  Remeron 7.5 mg nightly as needed for insomnia.  Patient abusing alcohol, marijuana, and cocaine.  Provide cessation counseling and outpatient substance abuse treatment.  Thiamine supplementation daily.  30, MD 06/20/2021, 11:58 AM

## 2021-06-21 DIAGNOSIS — F333 Major depressive disorder, recurrent, severe with psychotic symptoms: Secondary | ICD-10-CM | POA: Diagnosis not present

## 2021-06-21 NOTE — [facility] Group Notes (Signed)
[facility] Group Notes:  (Nursing/MHT/Case Management/Adjunct)  Date:  06/21/2021  Time:  9:41 PM  Type of Therapy:  Group Therapy  Participation Level:  Active  Participation Quality:  Appropriate  Affect:  Appropriate  Cognitive:  Alert  Insight:  Good  Engagement in Group:  Engaged and   goal is to work on D/C  Modes of Intervention:  Support  Summary of Progress/Problems:  Mayra Neer 06/21/2021, 9:41 PM

## 2021-06-21 NOTE — Progress Notes (Signed)
D: Pt alert and oriented. Pt rates depression 5/10, hopelessness 5/10, and anxiety 5/10. Pt goal: "Discharge plan, medication discipline." Pt reports energy level as normal and concentration as being poor. Pt reports sleep last night as being good. Pt did not receive medications for sleep. Pt reports experiencing 2/10 headache pain, prn meds offer and pt refused. Pt denies experiencing any SI/HI, or AVH at this time.    Pt reports he was hopeful yesterday after meeting Lorella Nimrod from [facility]. Pt reports having anxiety now because though because he's afraid he won't get the resources he needs when discharged. Pt states that he's use to having to fin for himself so if he has to again he guesses that's okay. This Clinical research associate reminded him he'll be close to the [facility] and that he should make sure to see his MD there, get his medications, and utilize the housing and transportation resources they provide so as to be able to get back onto his feet. Pt agrees that would be a good ideal.  A: Scheduled medications administered to pt, per MD orders. Support and encouragement provided. Frequent verbal contact made. Routine safety checks conducted q15 minutes.   R: No adverse drug reactions noted. Pt verbally contracts for safety at this time. Pt complaint with medications and treatment plan. Pt interacts well with others on the unit. Pt remains safe at this time. Will continue to monitor.

## 2021-06-21 NOTE — [facility] Group Notes (Signed)
LCSW Group Therapy Note     06/21/2021 2:57 PM  Type of Therapy and Topic:  Group Therapy:  Feelings around Relapse and Recovery  Participation Level:  Did Not Attend  Description of Group:    Patients in this group will discuss emotions they experience before and after a relapse. They will process how experiencing these feelings, or avoidance of experiencing them, relates to having a relapse. Facilitator will guide patients to explore emotions they have related to recovery. Patients will be encouraged to process which emotions are more powerful. They will be guided to discuss the emotional reaction significant others in their lives may have to their relapse or recovery. Patients will be assisted in exploring ways to respond to the emotions of others without this contributing to a relapse.     Therapeutic Goals:  1.    Patient will identify two or more emotions that lead to a relapse for them 2.    Patient will identify two emotions that result when they relapse 3.    Patient will identify two emotions related to recovery 4.    Patient will demonstrate ability to communicate their needs through discussion and/or role plays    Summary of Patient Progress: Patient did not attend group despite encouraged participation.      Therapeutic Modalities:  Cognitive Behavioral Therapy Solution-Focused Therapy Assertiveness Training Relapse Prevention Therapy    Gwenevere Ghazi, LCSW, LCAS-A  06/21/2021 2:57 PM

## 2021-06-21 NOTE — Progress Notes (Signed)
Recreation Therapy Notes    Date: 06/21/2021  Time: 9:45 am  Location: Courtyard     Behavioral response: Appropriate   Intervention Topic: Leisure    Discussion/Intervention:  Group content today was focused on leisure. The group defined what leisure is and some positive leisure activities they participate in. Individuals identified the difference between good and bad leisure. Participants expressed how they feel after participating in the leisure of their choice. The group discussed how they go about picking a leisure activity and if others are involved in their leisure activities. The patient stated how many leisure activities they too choose from and reasons why it is important to have leisure time. Individuals participated in the intervention "Exploring Leisure" where they had a chance to identify new leisure activities as well as benefits of leisure. Clinical Observations/Feedback:  Patient came to group and participated in leisure actives. Individual was social with peers and staff while participating in the intervention.     Madailein Londo LRT/CTRS         Kamillah Didonato 06/21/2021 11:32 AM

## 2021-06-21 NOTE — Progress Notes (Signed)
[facility] MD Progress Note  06/21/2021 12:42 PM Jeremy Frank  MRN:  [id number]  CC "I'm a little better"  Subjective:  35 year old male with PTSD, depression, and polysubstance abuse presenting for worsening hallucinations, suicidal ideations, and homicidal ideations.  Patient seen one-on-one again today.  He reports that he is feeling somewhat better and more hopeful since admission.  He does feel calmer and more able to handle life stressors now that he is back on Depakote and Abilify.  He denies any current suicidal ideations, but notes he was having some earlier this morning.  He has some passive homicidal ideations, but no plans to act on them.  He continues to have occasional visual hallucinations but these are improving as well.  Principal Problem: Severe recurrent major depression with psychotic features (HCC) Diagnosis: Principal Problem:   Severe recurrent major depression with psychotic features (HCC) Active Problems:   Posttraumatic stress disorder   Traumatic brain injury (HCC)   Cocaine dependence (HCC)   Marijuana abuse   Nicotine dependence   Severe alcohol dependence (HCC)  Total Time spent with patient: 30 minutes  Past Psychiatric History: See H&P  Past Medical History:  Past Medical History:  Diagnosis Date   Drug dependence (HCC)    GI bleed    Hiatal hernia    Hypertension    PTSD (post-traumatic stress disorder)     Past Surgical History:  Procedure Laterality Date   KNEE SURGERY     Family History: History reviewed. No pertinent family history. Family Psychiatric  History: See H&P Social History:  Social History   Substance and Sexual Activity  Alcohol Use Yes   Alcohol/week: 42.0 standard drinks   Types: 42 Cans of beer per week   Comment: per pt he drinks 6-12 cans of beer daily     Social History   Substance and Sexual Activity  Drug Use Yes   Types: Cocaine, Marijuana    Social History   Socioeconomic History   Marital status: Single     Spouse name: Not on file   Number of children: Not on file   Years of education: Not on file   Highest education level: Not on file  Occupational History   Not on file  Tobacco Use   Smoking status: Every Day    Packs/day: 0.25    Pack years: 0.00    Types: Cigarettes   Smokeless tobacco: Never  Vaping Use   Vaping Use: Never used  Substance and Sexual Activity   Alcohol use: Yes    Alcohol/week: 42.0 standard drinks    Types: 42 Cans of beer per week    Comment: per pt he drinks 6-12 cans of beer daily   Drug use: Yes    Types: Cocaine, Marijuana   Sexual activity: Not on file  Other Topics Concern   Not on file  Social History Narrative   Not on file   Social Determinants of Health   Financial Resource Strain: Not on file  Food Insecurity: Not on file  Transportation Needs: Not on file  Physical Activity: Not on file  Stress: Not on file  Social Connections: Not on file   Additional Social History:       Sleep: Fair  Appetite:  Fair  Current Medications: Current Facility-Administered Medications  Medication Dose Route Frequency Provider Last Rate Last Admin   acetaminophen (TYLENOL) tablet 650 mg  650 mg Oral Q6H PRN Clapacs, Jackquline Denmark, MD       alum &  mag hydroxide-simeth (MAALOX/MYLANTA) 200-200-20 MG/5ML suspension 30 mL  30 mL Oral Q4H PRN Clapacs, John T, MD       ARIPiprazole (ABILIFY) tablet 10 mg  10 mg Oral Daily Les Pou M, MD   10 mg at 06/21/21 0809   divalproex (DEPAKOTE) DR tablet 500 mg  500 mg Oral Q12H Clapacs, John T, MD   500 mg at 06/21/21 0809   gabapentin (NEURONTIN) capsule 100 mg  100 mg Oral TID Clapacs, John T, MD   100 mg at 06/21/21 1124   magnesium hydroxide (MILK OF MAGNESIA) suspension 30 mL  30 mL Oral Daily PRN Clapacs, John T, MD       mirtazapine (REMERON) tablet 7.5 mg  7.5 mg Oral QHS PRN Jesse Sans, MD   7.5 mg at 06/19/21 2050   multivitamin with minerals tablet 1 tablet  1 tablet Oral Daily Clapacs, Jackquline Denmark,  MD   1 tablet at 06/21/21 8099   thiamine tablet 100 mg  100 mg Oral Daily Clapacs, Jackquline Denmark, MD   100 mg at 06/21/21 8338    Lab Results:  Results for orders placed or performed during the hospital encounter of 06/17/21 (from the past 48 hour(s))  Valproic acid level     Status: Abnormal   Collection Time: 06/20/21  7:00 AM  Result Value Ref Range   Valproic Acid Lvl 49 (L) 50.0 - 100.0 ug/mL    Comment: Performed at [facility], [address] [place], [place] [address]    Blood Alcohol level:  Lab Results  Component Value Date   ETH 88 (H) 06/16/2021   ETH 49 (H) 03/13/2021    Metabolic Disorder Labs: Lab Results  Component Value Date   HGBA1C 5.8 (H) 06/19/2021   MPG 119.76 06/19/2021   No results found for: PROLACTIN Lab Results  Component Value Date   CHOL 145 06/19/2021   TRIG 63 06/19/2021   HDL 60 06/19/2021   CHOLHDL 2.4 06/19/2021   VLDL 13 06/19/2021   LDLCALC 72 06/19/2021    Physical Findings: AIMS:  , ,  ,  ,    CIWA:    COWS:     Musculoskeletal: Strength & Muscle Tone: within normal limits Gait & Station: normal Patient leans: N/A  Psychiatric Specialty Exam:  Presentation  General Appearance: Casual  Eye Contact:Minimal  Speech:Clear and Coherent  Speech Volume:Normal  Handedness:Right   Mood and Affect  Mood:Anxious; Irritable  Affect:Congruent   Thought Process  Thought Processes:Coherent  Descriptions of Associations:Intact  Orientation:Full (Time, Place and Person)  Thought Content:Paranoid Ideation  History of Schizophrenia/Schizoaffective disorder:No  Duration of Psychotic Symptoms:Greater than six months  Hallucinations:AH and VH  Ideas of Reference:Paranoia  Suicidal Thoughts:Denies  Homicidal Thoughts:Denies   Sensorium  Memory:Immediate Good; Remote Fair; Recent Fair  Judgment:Impaired  Insight:Present   Executive Functions  Concentration:Fair  Attention  Span:Fair  Recall:Fair  Fund of Knowledge:Fair  Language:Fair   Psychomotor Activity  Psychomotor Activity:normal   Assets  Assets:Desire for Improvement; Financial Resources/Insurance; Resilience   Sleep  Sleep:Good, 8.3    Physical Exam: Physical Exam ROS Blood pressure (!) 131/92, pulse 64, temperature 98.9 F (37.2 C), temperature source Oral, resp. rate 18, height 5\' 11"  (1.803 m), weight 114.8 kg, SpO2 99 %. Body mass index is 35.29 kg/m.   Treatment Plan Summary: Daily contact with patient to assess and evaluate symptoms and progress in treatment and Medication management78 year old male with chronic PTSD, major depressive disorder with psychotic features, and  polysubstance abuse presenting for hallucinations, suicidal ideations, and homicidal ideations.  Continue Depakote 500 mg every 12 hours. Valproic acid level somewhat subtherapeutic at 49, however, clinically appears somnolent so will not increase. Continue Abilify 10 mg daily for hallucinations. Remeron 7.5 mg nightly as needed for insomnia.  Patient abusing alcohol, marijuana, and cocaine.  Provide cessation counseling and outpatient substance abuse treatment.  Thiamine supplementation daily.  Jesse Sans, MD 06/21/2021, 12:42 PM

## 2021-06-22 DIAGNOSIS — F333 Major depressive disorder, recurrent, severe with psychotic symptoms: Secondary | ICD-10-CM | POA: Diagnosis not present

## 2021-06-22 NOTE — Progress Notes (Signed)
D: Pt alert and oriented. Pt rates depression 8/10, hopelessness 0/10, and anxiety 0/10. Pt goal: "Discharge plan." Pt reports energy level as normal and concentration as being good. Pt reports sleep last night as being fair. Pt did not receive medications for sleep. Pt denies experiencing any pain at this time. Pt denies experiencing any SI/HI, or AVH at this time.   Pt is calm and cooperative however forwards little information and appears as guarded.  A: Scheduled medications administered to pt, per MD orders. Support and encouragement provided. Frequent verbal contact made. Routine safety checks conducted q15 minutes.   R: No adverse drug reactions noted. Pt verbally contracts for safety at this time. Pt complaint with medications and treatment plan. Pt interacts well with others on the unit. Pt remains safe at this time. Will continue to monitor.

## 2021-06-22 NOTE — Progress Notes (Signed)
[facility] MD Progress Note  06/22/2021 1:08 PM Jeremy Frank  MRN:  [id number] Subjective: Patient seen and chart reviewed.  Patient with depression and PTSD.  He tells me he is feeling much better.  Feels less anxious and less depressed.  Not having active suicidal thoughts.  Last night he did have a spell of paranoia.  Cannot really put his finger on what that was about but it seems to have passed today.  Tolerating medicine adequately. Principal Problem: Severe recurrent major depression with psychotic features (HCC) Diagnosis: Principal Problem:   Severe recurrent major depression with psychotic features (HCC) Active Problems:   Posttraumatic stress disorder   Traumatic brain injury (HCC)   Cocaine dependence (HCC)   Marijuana abuse   Nicotine dependence   Severe alcohol dependence (HCC)  Total Time spent with patient: 30 minutes  Past Psychiatric History: Past history of PTSD depression and substance abuse history  Past Medical History:  Past Medical History:  Diagnosis Date   Drug dependence (HCC)    GI bleed    Hiatal hernia    Hypertension    PTSD (post-traumatic stress disorder)     Past Surgical History:  Procedure Laterality Date   KNEE SURGERY     Family History: History reviewed. No pertinent family history. Family Psychiatric  History: See previous Social History:  Social History   Substance and Sexual Activity  Alcohol Use Yes   Alcohol/week: 42.0 standard drinks   Types: 42 Cans of beer per week   Comment: per pt he drinks 6-12 cans of beer daily     Social History   Substance and Sexual Activity  Drug Use Yes   Types: Cocaine, Marijuana    Social History   Socioeconomic History   Marital status: Single    Spouse name: Not on file   Number of children: Not on file   Years of education: Not on file   Highest education level: Not on file  Occupational History   Not on file  Tobacco Use   Smoking status: Every Day    Packs/day: 0.25    Pack  years: 0.00    Types: Cigarettes   Smokeless tobacco: Never  Vaping Use   Vaping Use: Never used  Substance and Sexual Activity   Alcohol use: Yes    Alcohol/week: 42.0 standard drinks    Types: 42 Cans of beer per week    Comment: per pt he drinks 6-12 cans of beer daily   Drug use: Yes    Types: Cocaine, Marijuana   Sexual activity: Not on file  Other Topics Concern   Not on file  Social History Narrative   Not on file   Social Determinants of Health   Financial Resource Strain: Not on file  Food Insecurity: Not on file  Transportation Needs: Not on file  Physical Activity: Not on file  Stress: Not on file  Social Connections: Not on file   Additional Social History:                         Sleep: Fair  Appetite:  Fair  Current Medications: Current Facility-Administered Medications  Medication Dose Route Frequency Provider Last Rate Last Admin   acetaminophen (TYLENOL) tablet 650 mg  650 mg Oral Q6H PRN Ellason Segar T, MD       alum & mag hydroxide-simeth (MAALOX/MYLANTA) 200-200-20 MG/5ML suspension 30 mL  30 mL Oral Q4H PRN Jess Toney, Jackquline Denmark, MD  ARIPiprazole (ABILIFY) tablet 10 mg  10 mg Oral Daily Jesse Sans, MD   10 mg at 06/22/21 0756   divalproex (DEPAKOTE) DR tablet 500 mg  500 mg Oral Q12H Merryl Buckels, Jackquline Denmark, MD   500 mg at 06/22/21 0756   gabapentin (NEURONTIN) capsule 100 mg  100 mg Oral TID Anyla Israelson T, MD   100 mg at 06/22/21 1232   magnesium hydroxide (MILK OF MAGNESIA) suspension 30 mL  30 mL Oral Daily PRN Aniayah Alaniz T, MD       mirtazapine (REMERON) tablet 7.5 mg  7.5 mg Oral QHS PRN Jesse Sans, MD   7.5 mg at 06/19/21 2050   multivitamin with minerals tablet 1 tablet  1 tablet Oral Daily Emmaleah Meroney, Jackquline Denmark, MD   1 tablet at 06/22/21 0756   thiamine tablet 100 mg  100 mg Oral Daily Edwardine Deschepper, Jackquline Denmark, MD   100 mg at 06/22/21 1610    Lab Results: No results found for this or any previous visit (from the past 48  hour(s)).  Blood Alcohol level:  Lab Results  Component Value Date   ETH 88 (H) 06/16/2021   ETH 49 (H) 03/13/2021    Metabolic Disorder Labs: Lab Results  Component Value Date   HGBA1C 5.8 (H) 06/19/2021   MPG 119.76 06/19/2021   No results found for: PROLACTIN Lab Results  Component Value Date   CHOL 145 06/19/2021   TRIG 63 06/19/2021   HDL 60 06/19/2021   CHOLHDL 2.4 06/19/2021   VLDL 13 06/19/2021   LDLCALC 72 06/19/2021    Physical Findings: AIMS:  , ,  ,  ,    CIWA:    COWS:     Musculoskeletal: Strength & Muscle Tone: within normal limits Gait & Station: normal Patient leans: N/A  Psychiatric Specialty Exam:  Presentation  General Appearance: Casual  Eye Contact:Minimal  Speech:Clear and Coherent  Speech Volume:Normal  Handedness:Right   Mood and Affect  Mood:Anxious; Irritable  Affect:Congruent   Thought Process  Thought Processes:Coherent  Descriptions of Associations:Intact  Orientation:Full (Time, Place and Person)  Thought Content:Paranoid Ideation  History of Schizophrenia/Schizoaffective disorder:No  Duration of Psychotic Symptoms:Greater than six months  Hallucinations:No data recorded Ideas of Reference:Paranoia  Suicidal Thoughts:No data recorded Homicidal Thoughts:No data recorded  Sensorium  Memory:Immediate Good; Remote Fair; Recent Fair  Judgment:Impaired  Insight:Present   Executive Functions  Concentration:Fair  Attention Span:Fair  Recall:Fair  Fund of Knowledge:Fair  Language:Fair   Psychomotor Activity  Psychomotor Activity: No data recorded  Assets  Assets:Desire for Improvement; Financial Resources/Insurance; Resilience   Sleep  Sleep: No data recorded   Physical Exam: Physical Exam Vitals and nursing note reviewed.  Constitutional:      Appearance: Normal appearance.  HENT:     Head: Normocephalic and atraumatic.     Mouth/Throat:     Pharynx: Oropharynx is clear.  Eyes:      Pupils: Pupils are equal, round, and reactive to light.  Cardiovascular:     Rate and Rhythm: Normal rate and regular rhythm.  Pulmonary:     Effort: Pulmonary effort is normal.     Breath sounds: Normal breath sounds.  Abdominal:     General: Abdomen is flat.     Palpations: Abdomen is soft.  Musculoskeletal:        General: Normal range of motion.  Skin:    General: Skin is warm and dry.  Neurological:     General: No focal deficit present.  Mental Status: He is alert. Mental status is at baseline.  Psychiatric:        Attention and Perception: Attention normal.        Mood and Affect: Mood normal.        Speech: Speech normal.        Behavior: Behavior is cooperative.        Thought Content: Thought content normal.        Cognition and Memory: Cognition normal.   Review of Systems  Constitutional: Negative.   HENT: Negative.    Eyes: Negative.   Respiratory: Negative.    Cardiovascular: Negative.   Gastrointestinal: Negative.   Musculoskeletal: Negative.   Skin: Negative.   Neurological: Negative.   Psychiatric/Behavioral: Negative.    Blood pressure 134/90, pulse 79, temperature 98.2 F (36.8 C), temperature source Oral, resp. rate 18, height 5\' 11"  (1.803 m), weight 114.8 kg, SpO2 100 %. Body mass index is 35.29 kg/m.   Treatment Plan Summary: Medication management and Plan no change to medication.  Supportive counseling and psychoeducation and encouragement done with the patient.  Glad to see that he is getting up out of his room attending groups being around other people.  , MD 06/22/2021, 1:08 PM

## 2021-06-22 NOTE — [facility] Group Notes (Signed)
LCSW Group Therapy Note  06/22/2021 2:40 PM  Type of Therapy and Topic:  Group Therapy: Avoiding Self-Sabotaging and Enabling Behaviors  Participation Level:  Did Not Attend   Description of Group:   In this group, patients will learn how to identify obstacles, self-sabotaging and enabling behaviors, as well as: what are they, why do we do them and what needs these behaviors meet. Discuss unhealthy relationships and how to have positive healthy boundaries with those that sabotage and enable. Explore aspects of self-sabotage and enabling in yourself and how to limit these self-destructive behaviors in everyday life.   Therapeutic Goals: Patient will identify one obstacle that relates to self-sabotage and enabling behaviors Patient will identify one personal self-sabotaging or enabling behavior they did prior to admission Patient will state a plan to change the above identified behavior Patient will demonstrate ability to communicate their needs through discussion and/or role play.   Summary of Patient Progress: Patient did not attend group despite encouraged participation.    Therapeutic Modalities:   Cognitive Behavioral Therapy Person-Centered Therapy Motivational Interviewing   Aryelle Figg, MSW, LCSWA, LCASA 06/22/2021 2:40 PM 

## 2021-06-22 NOTE — Progress Notes (Signed)
Patient has been spending more time out of his room. Minimal interaction. Denies SI, HI and AVH

## 2021-06-22 NOTE — Plan of Care (Signed)
  Problem: Education: Goal: Ability to state activities that reduce stress will improve Outcome: Progressing   Problem: Coping: Goal: Ability to identify and develop effective coping behavior will improve Outcome: Progressing   Problem: Self-Concept: Goal: Ability to identify factors that promote anxiety will improve Outcome: Progressing Goal: Level of anxiety will decrease Outcome: Progressing Goal: Ability to modify response to factors that promote anxiety will improve Outcome: Progressing   Problem: Education: Goal: Knowledge of [facility] Education information/materials will improve Outcome: Progressing Goal: Emotional status will improve Outcome: Progressing Goal: Mental status will improve Outcome: Progressing Goal: Verbalization of understanding the information provided will improve Outcome: Progressing   Problem: Activity: Goal: Interest or engagement in activities will improve Outcome: Progressing Goal: Sleeping patterns will improve Outcome: Progressing   Problem: Coping: Goal: Ability to verbalize frustrations and anger appropriately will improve Outcome: Progressing Goal: Ability to demonstrate self-control will improve Outcome: Progressing   Problem: Health Behavior/Discharge Planning: Goal: Identification of resources available to assist in meeting health care needs will improve Outcome: Progressing Goal: Compliance with treatment plan for underlying cause of condition will improve Outcome: Progressing   Problem: Physical Regulation: Goal: Ability to maintain clinical measurements within normal limits will improve Outcome: Progressing   Problem: Safety: Goal: Periods of time without injury will increase Outcome: Progressing   Problem: Education: Goal: Utilization of techniques to improve thought processes will improve Outcome: Progressing Goal: Knowledge of the prescribed therapeutic regimen will improve Outcome: Progressing   Problem:  Activity: Goal: Interest or engagement in leisure activities will improve Outcome: Progressing Goal: Imbalance in normal sleep/wake cycle will improve Outcome: Progressing   Problem: Coping: Goal: Coping ability will improve Outcome: Progressing Goal: Will verbalize feelings Outcome: Progressing   Problem: Health Behavior/Discharge Planning: Goal: Ability to make decisions will improve Outcome: Progressing Goal: Compliance with therapeutic regimen will improve Outcome: Progressing   Problem: Role Relationship: Goal: Will demonstrate positive changes in social behaviors and relationships Outcome: Progressing   Problem: Safety: Goal: Ability to disclose and discuss suicidal ideas will improve Outcome: Progressing Goal: Ability to identify and utilize support systems that promote safety will improve Outcome: Progressing   Problem: Self-Concept: Goal: Will verbalize positive feelings about self Outcome: Progressing Goal: Level of anxiety will decrease Outcome: Progressing   

## 2021-06-23 DIAGNOSIS — F333 Major depressive disorder, recurrent, severe with psychotic symptoms: Secondary | ICD-10-CM | POA: Diagnosis not present

## 2021-06-23 NOTE — Progress Notes (Signed)
[facility] MD Progress Note  06/23/2021 12:11 PM Jeremy Frank  MRN:  [id number] Subjective: Follow-up patient with depression with psychotic features.  Mood reported as being good.  Not depressed.  No suicidal thought.  Hallucinations not present.  Does complain of having headaches and thinks it is related to gabapentin.  Looking forward to discharge Principal Problem: Severe recurrent major depression with psychotic features (HCC) Diagnosis: Principal Problem:   Severe recurrent major depression with psychotic features (HCC) Active Problems:   Posttraumatic stress disorder   Traumatic brain injury (HCC)   Cocaine dependence (HCC)   Marijuana abuse   Nicotine dependence   Severe alcohol dependence (HCC)  Total Time spent with patient: 30 minutes  Past Psychiatric History: Past history of recurrent depression and PTSD  Past Medical History:  Past Medical History:  Diagnosis Date   Drug dependence (HCC)    GI bleed    Hiatal hernia    Hypertension    PTSD (post-traumatic stress disorder)     Past Surgical History:  Procedure Laterality Date   KNEE SURGERY     Family History: History reviewed. No pertinent family history. Family Psychiatric  History: See previous Social History:  Social History   Substance and Sexual Activity  Alcohol Use Yes   Alcohol/week: 42.0 standard drinks   Types: 42 Cans of beer per week   Comment: per pt he drinks 6-12 cans of beer daily     Social History   Substance and Sexual Activity  Drug Use Yes   Types: Cocaine, Marijuana    Social History   Socioeconomic History   Marital status: Single    Spouse name: Not on file   Number of children: Not on file   Years of education: Not on file   Highest education level: Not on file  Occupational History   Not on file  Tobacco Use   Smoking status: Every Day    Packs/day: 0.25    Pack years: 0.00    Types: Cigarettes   Smokeless tobacco: Never  Vaping Use   Vaping Use: Never used   Substance and Sexual Activity   Alcohol use: Yes    Alcohol/week: 42.0 standard drinks    Types: 42 Cans of beer per week    Comment: per pt he drinks 6-12 cans of beer daily   Drug use: Yes    Types: Cocaine, Marijuana   Sexual activity: Not on file  Other Topics Concern   Not on file  Social History Narrative   Not on file   Social Determinants of Health   Financial Resource Strain: Not on file  Food Insecurity: Not on file  Transportation Needs: Not on file  Physical Activity: Not on file  Stress: Not on file  Social Connections: Not on file   Additional Social History:                         Sleep: Negative  Appetite:  Negative  Current Medications: Current Facility-Administered Medications  Medication Dose Route Frequency Provider Last Rate Last Admin   acetaminophen (TYLENOL) tablet 650 mg  650 mg Oral Q6H PRN Felicita Nuncio T, MD       alum & mag hydroxide-simeth (MAALOX/MYLANTA) 200-200-20 MG/5ML suspension 30 mL  30 mL Oral Q4H PRN Alania Overholt T, MD       ARIPiprazole (ABILIFY) tablet 10 mg  10 mg Oral Daily Jesse Sans, MD   10 mg at 06/23/21 [phone]  divalproex (DEPAKOTE) DR tablet 500 mg  500 mg Oral Q12H Nakeia Calvi T, MD   500 mg at 06/23/21 0813   magnesium hydroxide (MILK OF MAGNESIA) suspension 30 mL  30 mL Oral Daily PRN Carrissa Taitano T, MD       mirtazapine (REMERON) tablet 7.5 mg  7.5 mg Oral QHS PRN Jesse Sans, MD   7.5 mg at 06/22/21 2106   multivitamin with minerals tablet 1 tablet  1 tablet Oral Daily Noemie Devivo, Jackquline Denmark, MD   1 tablet at 06/23/21 6283   thiamine tablet 100 mg  100 mg Oral Daily Ozzy Bohlken, Jackquline Denmark, MD   100 mg at 06/23/21 0813    Lab Results: No results found for this or any previous visit (from the past 48 hour(s)).  Blood Alcohol level:  Lab Results  Component Value Date   ETH 88 (H) 06/16/2021   ETH 49 (H) 03/13/2021    Metabolic Disorder Labs: Lab Results  Component Value Date   HGBA1C 5.8 (H)  06/19/2021   MPG 119.76 06/19/2021   No results found for: PROLACTIN Lab Results  Component Value Date   CHOL 145 06/19/2021   TRIG 63 06/19/2021   HDL 60 06/19/2021   CHOLHDL 2.4 06/19/2021   VLDL 13 06/19/2021   LDLCALC 72 06/19/2021    Physical Findings: AIMS:  , ,  ,  ,    CIWA:    COWS:     Musculoskeletal: Strength & Muscle Tone: within normal limits Gait & Station: normal Patient leans: N/A  Psychiatric Specialty Exam:  Presentation  General Appearance: Casual  Eye Contact:Minimal  Speech:Clear and Coherent  Speech Volume:Normal  Handedness:Right   Mood and Affect  Mood:Anxious; Irritable  Affect:Congruent   Thought Process  Thought Processes:Coherent  Descriptions of Associations:Intact  Orientation:Full (Time, Place and Person)  Thought Content:Paranoid Ideation  History of Schizophrenia/Schizoaffective disorder:No  Duration of Psychotic Symptoms:Greater than six months  Hallucinations:No data recorded Ideas of Reference:Paranoia  Suicidal Thoughts:No data recorded Homicidal Thoughts:No data recorded  Sensorium  Memory:Immediate Good; Remote Fair; Recent Fair  Judgment:Impaired  Insight:Present   Executive Functions  Concentration:Fair  Attention Span:Fair  Recall:Fair  Fund of Knowledge:Fair  Language:Fair   Psychomotor Activity  Psychomotor Activity: No data recorded  Assets  Assets:Desire for Improvement; Financial Resources/Insurance; Resilience   Sleep  Sleep: No data recorded   Physical Exam: Physical Exam Vitals and nursing note reviewed.  Constitutional:      Appearance: Normal appearance.  HENT:     Head: Normocephalic and atraumatic.     Mouth/Throat:     Pharynx: Oropharynx is clear.  Eyes:     Pupils: Pupils are equal, round, and reactive to light.  Cardiovascular:     Rate and Rhythm: Normal rate and regular rhythm.  Pulmonary:     Effort: Pulmonary effort is normal.     Breath sounds:  Normal breath sounds.  Abdominal:     General: Abdomen is flat.     Palpations: Abdomen is soft.  Musculoskeletal:        General: Normal range of motion.  Skin:    General: Skin is warm and dry.  Neurological:     General: No focal deficit present.     Mental Status: He is alert. Mental status is at baseline.  Psychiatric:        Mood and Affect: Mood normal.        Thought Content: Thought content normal.   Review of Systems  Constitutional: Negative.  HENT: Negative.    Eyes: Negative.   Respiratory: Negative.    Cardiovascular: Negative.   Gastrointestinal: Negative.   Musculoskeletal: Negative.   Skin: Negative.   Neurological:  Positive for headaches.  Psychiatric/Behavioral: Negative.    Blood pressure 115/79, pulse 85, temperature 98.2 F (36.8 C), temperature source Oral, resp. rate 18, height 5\' 11"  (1.803 m), weight 114.8 kg, SpO2 98 %. Body mass index is 35.29 kg/m.   Treatment Plan Summary: Medication management and Plan discontinue gabapentin at patient's request.  No other change to medicine.  Encourage patient to be thinking about what he wants to do after discharge as he is hoping to be discharged as early as tomorrow.  , MD 06/23/2021, 12:11 PM

## 2021-06-23 NOTE — Progress Notes (Signed)
Patient is pleasant and cooperative.  He has been in the day room.  He is less guarded.  Smiles a lot.  Denies SI, HI, and AVH.  He is medication compliant. Safety checks were done throughout the day.

## 2021-06-23 NOTE — Progress Notes (Signed)
Patient has been pleasant and cooperative. Has spent time in the day room drawing and was pleased when staff complimented his artwork. Less guarded. Smiles upon approach. Denies SI, HI and AVH. Requested and received Remeron for sleep per prn order

## 2021-06-23 NOTE — Plan of Care (Signed)
  Problem: Education: Goal: Ability to state activities that reduce stress will improve Outcome: Progressing   Problem: Coping: Goal: Ability to identify and develop effective coping behavior will improve Outcome: Progressing   Problem: Self-Concept: Goal: Ability to identify factors that promote anxiety will improve Outcome: Progressing Goal: Level of anxiety will decrease Outcome: Progressing Goal: Ability to modify response to factors that promote anxiety will improve Outcome: Progressing   Problem: Education: Goal: Knowledge of [place] [facility] Education information/materials will improve Outcome: Progressing Goal: Emotional status will improve Outcome: Progressing Goal: Mental status will improve Outcome: Progressing Goal: Verbalization of understanding the information provided will improve Outcome: Progressing   Problem: Activity: Goal: Interest or engagement in activities will improve Outcome: Progressing Goal: Sleeping patterns will improve Outcome: Progressing   Problem: Coping: Goal: Ability to verbalize frustrations and anger appropriately will improve Outcome: Progressing Goal: Ability to demonstrate self-control will improve Outcome: Progressing   Problem: Health Behavior/Discharge Planning: Goal: Identification of resources available to assist in meeting health care needs will improve Outcome: Progressing Goal: Compliance with treatment plan for underlying cause of condition will improve Outcome: Progressing   Problem: Physical Regulation: Goal: Ability to maintain clinical measurements within normal limits will improve Outcome: Progressing   Problem: Safety: Goal: Periods of time without injury will increase Outcome: Progressing   Problem: Education: Goal: Utilization of techniques to improve thought processes will improve Outcome: Progressing Goal: Knowledge of the prescribed therapeutic regimen will improve Outcome: Progressing   Problem:  Activity: Goal: Interest or engagement in leisure activities will improve Outcome: Progressing Goal: Imbalance in normal sleep/wake cycle will improve Outcome: Progressing   Problem: Coping: Goal: Coping ability will improve Outcome: Progressing Goal: Will verbalize feelings Outcome: Progressing   Problem: Health Behavior/Discharge Planning: Goal: Ability to make decisions will improve Outcome: Progressing Goal: Compliance with therapeutic regimen will improve Outcome: Progressing   Problem: Role Relationship: Goal: Will demonstrate positive changes in social behaviors and relationships Outcome: Progressing   Problem: Safety: Goal: Ability to disclose and discuss suicidal ideas will improve Outcome: Progressing Goal: Ability to identify and utilize support systems that promote safety will improve Outcome: Progressing   Problem: Self-Concept: Goal: Will verbalize positive feelings about self Outcome: Progressing Goal: Level of anxiety will decrease Outcome: Progressing   

## 2021-06-23 NOTE — BHH Group Notes (Signed)
LCSW Group Therapy Note  06/23/2021 2:40 PM  Type of Therapy and Topic:  Group Therapy:  Creating Healthy Boundaries  Participation Level:  Did Not Attend   Description of Group:   Patients in this group will discuss how creating boundaries supports mental health wellness and sobriety. Participants will consider examples of both healthy and unhealthy boundaries and potential consequences of both. Participants are asked to share how they have implemented healthy boundaries in the past and encouraged to create new boundaries to implement in their lives post hospitalization.   Therapeutic Goals: Patient will identify two or more emotions that lead to a relapse for them Patient will identify two emotions that result when they relapse Patient will identify two emotions related to recovery Patient will demonstrate ability to communicate their needs through discussion and/or role plays   Summary of Patient Progress: Patient did not attend group despite encouraged participation.    Therapeutic Modalities:   Cognitive Behavioral Therapy Solution-Focused Therapy Assertiveness Training Relapse Prevention Therapy   Toneisha Savary, MSW, LCSWA, LCASA 06/23/2021 2:40 PM 

## 2021-06-24 ENCOUNTER — Other Ambulatory Visit: Payer: Self-pay

## 2021-06-24 DIAGNOSIS — F333 Major depressive disorder, recurrent, severe with psychotic symptoms: Secondary | ICD-10-CM | POA: Diagnosis not present

## 2021-06-24 MED ORDER — ARIPIPRAZOLE 10 MG PO TABS
10.0000 mg | ORAL_TABLET | Freq: Every day | ORAL | 0 refills | Status: DC
  Filled 2021-06-24: qty 7, 7d supply, fill #0

## 2021-06-24 MED ORDER — DIVALPROEX SODIUM 500 MG PO DR TAB
500.0000 mg | DELAYED_RELEASE_TABLET | Freq: Two times a day (BID) | ORAL | 1 refills | Status: DC
  Filled 2021-06-24: qty 14, 7d supply, fill #0

## 2021-06-24 MED ORDER — MIRTAZAPINE 7.5 MG PO TABS
7.5000 mg | ORAL_TABLET | Freq: Every evening | ORAL | 1 refills | Status: DC | PRN
  Filled 2021-06-24: qty 30, 30d supply, fill #0

## 2021-06-24 MED ORDER — ARIPIPRAZOLE 10 MG PO TABS: 10.0000 mg | ORAL_TABLET | Freq: Every day | ORAL | 1 refills | Status: DC

## 2021-06-24 MED ORDER — DIVALPROEX SODIUM 500 MG PO DR TAB: 500.0000 mg | DELAYED_RELEASE_TABLET | Freq: Two times a day (BID) | ORAL | 1 refills | Status: DC

## 2021-06-24 MED ORDER — MIRTAZAPINE 7.5 MG PO TABS: 7.5000 mg | ORAL_TABLET | Freq: Every evening | ORAL | 1 refills | Status: DC | PRN

## 2021-06-24 NOTE — BHH Counselor (Signed)
CSW contacted [facility] [employer] [facility].  CSW was informed that the patient would need to call [phone] for an intake.  Following completing the intake staff will assist the patient in identifying an appropriate shelter.  She reports that this is not a fast process and will "be longer than a couple of days, it can be as long as 2 weeks or short as 2 days".   Penni Homans, MSW, LCSW 06/24/2021 12:06 PM

## 2021-06-24 NOTE — Plan of Care (Signed)
  Problem: Group Participation Goal: STG - Patient will engage in groups without prompting or encouragement from LRT x3 group sessions within 5 recreation therapy group sessions Description: STG - Patient will engage in groups without prompting or encouragement from LRT x3 group sessions within 5 recreation therapy group sessions Outcome: Completed/Met

## 2021-06-24 NOTE — Progress Notes (Signed)
Pt was educated on prescriptions and follow up care. Pt questions were answered and pt verbalized understanding and did not voice any concerns. Pt's belongings were returned. Pt was safely discharged to [facility]. Patient was not in any distress at time of discharge.

## 2021-06-24 NOTE — BHH Counselor (Signed)
CSW checked with [facility] in regards to bed availability and was informed that "we're slammed" and that no beds were available.   Penni Homans, MSW, LCSW 06/24/2021 11:29 AM

## 2021-06-24 NOTE — Progress Notes (Signed)
Recreation Therapy Notes   Date: 06/24/2021  Time: 10:00 am  Location: Craft room    Behavioral response: Appropriate   Intervention Topic: Relaxation   Discussion/Intervention:  Group content today was focused on relaxation. The group defined relaxation and identified healthy ways to relax. Individuals expressed how much time they spend relaxing. Patients expressed how much their life would be if they did not make time for themselves to relax. The group stated ways they could improve their relaxation techniques in the future.  Individuals participated in the intervention "Time to Relax" where they had a chance to experience different relaxation techniques.  Clinical Observations/Feedback:  Patient came to group and defined relaxation as taking physical strain off your heart. He expressed that relaxation is important to help lower blood pressure. Individual was social with peers and staff while participating in the intervention.     Dennys Traughber LRT/CTRS         Mischa Pollard 06/24/2021 11:43 AM

## 2021-06-24 NOTE — Plan of Care (Signed)
Pt rates depression 3/10, hopelessness 2/10 and anxiety 3/10. Pt was educated on care plan and verbalizes understanding. Torrie Mayers RN Problem: Education: Goal: Ability to state activities that reduce stress will improve Outcome: Adequate for Discharge   Problem: Coping: Goal: Ability to identify and develop effective coping behavior will improve Outcome: Adequate for Discharge   Problem: Self-Concept: Goal: Ability to identify factors that promote anxiety will improve Outcome: Adequate for Discharge Goal: Level of anxiety will decrease Outcome: Adequate for Discharge Goal: Ability to modify response to factors that promote anxiety will improve Outcome: Adequate for Discharge   Problem: Education: Goal: Knowledge of [place] [facility] Education information/materials will improve Outcome: Adequate for Discharge Goal: Emotional status will improve Outcome: Adequate for Discharge Goal: Mental status will improve Outcome: Adequate for Discharge Goal: Verbalization of understanding the information provided will improve Outcome: Adequate for Discharge   Problem: Activity: Goal: Interest or engagement in activities will improve Outcome: Adequate for Discharge Goal: Sleeping patterns will improve Outcome: Adequate for Discharge   Problem: Coping: Goal: Ability to verbalize frustrations and anger appropriately will improve Outcome: Adequate for Discharge Goal: Ability to demonstrate self-control will improve Outcome: Adequate for Discharge   Problem: Health Behavior/Discharge Planning: Goal: Identification of resources available to assist in meeting health care needs will improve Outcome: Adequate for Discharge Goal: Compliance with treatment plan for underlying cause of condition will improve Outcome: Adequate for Discharge   Problem: Physical Regulation: Goal: Ability to maintain clinical measurements within normal limits will improve Outcome: Adequate for Discharge    Problem: Safety: Goal: Periods of time without injury will increase Outcome: Adequate for Discharge   Problem: Education: Goal: Utilization of techniques to improve thought processes will improve Outcome: Adequate for Discharge Goal: Knowledge of the prescribed therapeutic regimen will improve Outcome: Adequate for Discharge   Problem: Activity: Goal: Interest or engagement in leisure activities will improve Outcome: Adequate for Discharge Goal: Imbalance in normal sleep/wake cycle will improve Outcome: Adequate for Discharge   Problem: Coping: Goal: Coping ability will improve Outcome: Adequate for Discharge Goal: Will verbalize feelings Outcome: Adequate for Discharge   Problem: Health Behavior/Discharge Planning: Goal: Ability to make decisions will improve Outcome: Adequate for Discharge Goal: Compliance with therapeutic regimen will improve Outcome: Adequate for Discharge   Problem: Role Relationship: Goal: Will demonstrate positive changes in social behaviors and relationships Outcome: Adequate for Discharge   Problem: Safety: Goal: Ability to disclose and discuss suicidal ideas will improve Outcome: Adequate for Discharge Goal: Ability to identify and utilize support systems that promote safety will improve Outcome: Adequate for Discharge   Problem: Self-Concept: Goal: Will verbalize positive feelings about self Outcome: Adequate for Discharge Goal: Level of anxiety will decrease Outcome: Adequate for Discharge   Problem: Self-Concept: Goal: Will verbalize positive feelings about self Outcome: Adequate for Discharge Goal: Level of anxiety will decrease Outcome: Adequate for Discharge

## 2021-06-24 NOTE — BHH Suicide Risk Assessment (Signed)
[facility] Discharge Suicide Risk Assessment   Principal Problem: Severe recurrent major depression with psychotic features [facility]) Discharge Diagnoses: Principal Problem:   Severe recurrent major depression with psychotic features (HCC) Active Problems:   Posttraumatic stress disorder   Traumatic brain injury (HCC)   Cocaine dependence (HCC)   Marijuana abuse   Nicotine dependence   Severe alcohol dependence (HCC)   Total Time spent with patient: 35 minutes- 25 minutes face-to-face contact with patient, 10 minutes documentation, coordination of care, scripts   Musculoskeletal: Strength & Muscle Tone: within normal limits Gait & Station: normal Patient leans: N/A  Psychiatric Specialty Exam  Presentation  General Appearance: Appropriate for Environment; Casual  Eye Contact:Good  Speech:Clear and Coherent; Normal Rate  Speech Volume:Normal  Handedness:Right   Mood and Affect  Mood:Euthymic  Duration of Depression Symptoms: Greater than two weeks  Affect:Congruent   Thought Process  Thought Processes:Coherent; Goal Directed  Descriptions of Associations:Intact  Orientation:Full (Time, Place and Person)  Thought Content:Logical  History of Schizophrenia/Schizoaffective disorder:No  Duration of Psychotic Symptoms:Greater than six months  Hallucinations:Hallucinations: None  Ideas of Reference:None  Suicidal Thoughts:Suicidal Thoughts: No  Homicidal Thoughts:Homicidal Thoughts: No   Sensorium  Memory:Immediate Good; Recent Good; Remote Good  Judgment:Intact  Insight:Present   Executive Functions  Concentration:Good  Attention Span:Good  Recall:Good  Fund of Knowledge:Good  Language:Good   Psychomotor Activity  Psychomotor Activity:Psychomotor Activity: Normal   Assets  Assets:Communication Skills; Desire for Improvement; Financial Resources/Insurance; Physical Health; Resilience; Social Support; Talents/Skills   Sleep  Sleep:Sleep:  Good Number of Hours of Sleep: 8.5   Physical Exam: Physical Exam ROS Blood pressure 122/83, pulse 70, temperature 99.1 F (37.3 C), temperature source Oral, resp. rate 18, height 5\' 11"  (1.803 m), weight 114.8 kg, SpO2 100 %. Body mass index is 35.29 kg/m.  Mental Status Per Nursing Assessment::   On Admission:  Self-harm thoughts  Demographic Factors:  Male  Loss Factors: NA  Historical Factors: NA  Risk Reduction Factors:   Sense of responsibility to family, Positive social support, Positive therapeutic relationship, and Positive coping skills or problem solving skills  Continued Clinical Symptoms:  Bipolar Disorder:   Depressive phase Alcohol/Substance Abuse/Dependencies Previous Psychiatric Diagnoses and Treatments  Cognitive Features That Contribute To Risk:  None    Suicide Risk:  Minimal: No identifiable suicidal ideation.  Patients presenting with no risk factors but with morbid ruminations; may be classified as minimal risk based on the severity of the depressive symptoms   Follow-up Information     [facility].   Specialty: General Practice Contact information: [address] [place] [phone]                 Plan Of Care/Follow-up recommendations:  Activity:  as tolerated Diet:  regular diet  [phone], MD 06/24/2021, 9:48 AM

## 2021-06-24 NOTE — Progress Notes (Signed)
Recreation Therapy Notes  INPATIENT RECREATION TR PLAN  Patient Details Name: Jeremy Frank MRN: [id number] DOB: 05-31-1986 Today's Date: 06/24/2021  Rec Therapy Plan Is patient appropriate for Therapeutic Recreation?: Yes Treatment times per week: at least 3 Estimated Length of Stay: 5-7 days TR Treatment/Interventions: Group participation (Comment)  Discharge Criteria Pt will be discharged from therapy if:: Discharged Treatment plan/goals/alternatives discussed and agreed upon by:: Patient/family  Discharge Summary Short term goals set: Patient will engage in groups without prompting or encouragement from LRT x3 group sessions within 5 recreation therapy group sessions Short term goals met: Complete Progress toward goals comments: Groups attended Which groups?: Leisure education, Other (Comment) (Relaxation) Reason goals not met: N/A Therapeutic equipment acquired: N/A Reason patient discharged from therapy: Discharge from hospital Pt/family agrees with progress & goals achieved: Yes Date patient discharged from therapy: 06/24/21   Amere Bricco 06/24/2021, 2:01 PM

## 2021-06-24 NOTE — BHH Group Notes (Signed)
LCSW Group Therapy Note   06/24/2021 12:37 PM  Type of Therapy and Topic:  Group Therapy:  Overcoming Obstacles   Participation Level:  Active   Description of Group:    In this group patients will be encouraged to explore what they see as obstacles to their own wellness and recovery. They will be guided to discuss their thoughts, feelings, and behaviors related to these obstacles. The group will process together ways to cope with barriers, with attention given to specific choices patients can make. Each patient will be challenged to identify changes they are motivated to make in order to overcome their obstacles. This group will be process-oriented, with patients participating in exploration of their own experiences as well as giving and receiving support and challenge from other group members.   Therapeutic Goals: Patient will identify personal and current obstacles as they relate to admission. Patient will identify barriers that currently interfere with their wellness or overcoming obstacles.  Patient will identify feelings, thought process and behaviors related to these barriers. Patient will identify two changes they are willing to make to overcome these obstacles:      Summary of Patient Progress Patient was an active participant in group.  Patient shared that his obstacle has been "himself".  Patient shared that he has been struggling with understanding his triggers. Patient was unable to continue the conversation due to being discharged.    Therapeutic Modalities:   Cognitive Behavioral Therapy Solution Focused Therapy Motivational Interviewing Relapse Prevention Therapy  Penni Homans, MSW, LCSW 06/24/2021 12:37 PM

## 2021-06-24 NOTE — Progress Notes (Addendum)
  [facility] Adult Case Management Discharge Plan :  Will you be returning to the same living situation after discharge:  No. At discharge, do you have transportation home?: Yes,  CSW to arrange transportation to [facility]. Do you have the ability to pay for your medications: Yes,  [employer].  Release of information consent forms completed and in the chart;  Patient's signature needed at discharge.  Patient to Follow up at: Follow-up Information     [facility] Schedule an appointment as soon as possible for a visit.   Why: Please call to make an appointment and establish care. Please present to "same day clinic" Mon-Fri @ 0740. This clinic is first-come first-serve, please present at least 15 minutes early. Contact information: [address] [facility] F[address]on [place] [address] [phone]         Next level of care provider has access to [facility] Link:no  Safety Planning and Suicide Prevention discussed: Yes,  Attempts made to contact parole officer.      Has patient been referred to the Quitline?: Patient refused referral  Patient has been referred for addiction treatment: Pt. refused referral  Glenis Smoker, LCSW 06/24/2021, 10:44 AM

## 2021-06-24 NOTE — Plan of Care (Signed)
  Problem: Education: Goal: Ability to state activities that reduce stress will improve Outcome: Progressing   Problem: Coping: Goal: Ability to identify and develop effective coping behavior will improve Outcome: Progressing   Problem: Self-Concept: Goal: Ability to identify factors that promote anxiety will improve Outcome: Progressing Goal: Level of anxiety will decrease Outcome: Progressing Goal: Ability to modify response to factors that promote anxiety will improve Outcome: Progressing   Problem: Education: Goal: Knowledge of [facility] Education information/materials will improve Outcome: Progressing Goal: Emotional status will improve Outcome: Progressing Goal: Mental status will improve Outcome: Progressing Goal: Verbalization of understanding the information provided will improve Outcome: Progressing   Problem: Activity: Goal: Interest or engagement in activities will improve Outcome: Progressing Goal: Sleeping patterns will improve Outcome: Progressing   Problem: Coping: Goal: Ability to verbalize frustrations and anger appropriately will improve Outcome: Progressing Goal: Ability to demonstrate self-control will improve Outcome: Progressing   Problem: Health Behavior/Discharge Planning: Goal: Identification of resources available to assist in meeting health care needs will improve Outcome: Progressing Goal: Compliance with treatment plan for underlying cause of condition will improve Outcome: Progressing   Problem: Physical Regulation: Goal: Ability to maintain clinical measurements within normal limits will improve Outcome: Progressing   Problem: Safety: Goal: Periods of time without injury will increase Outcome: Progressing   Problem: Education: Goal: Utilization of techniques to improve thought processes will improve Outcome: Progressing Goal: Knowledge of the prescribed therapeutic regimen will improve Outcome: Progressing   Problem:  Activity: Goal: Interest or engagement in leisure activities will improve Outcome: Progressing Goal: Imbalance in normal sleep/wake cycle will improve Outcome: Progressing   Problem: Coping: Goal: Coping ability will improve Outcome: Progressing Goal: Will verbalize feelings Outcome: Progressing   Problem: Health Behavior/Discharge Planning: Goal: Ability to make decisions will improve Outcome: Progressing Goal: Compliance with therapeutic regimen will improve Outcome: Progressing   Problem: Role Relationship: Goal: Will demonstrate positive changes in social behaviors and relationships Outcome: Progressing   Problem: Safety: Goal: Ability to disclose and discuss suicidal ideas will improve Outcome: Progressing Goal: Ability to identify and utilize support systems that promote safety will improve Outcome: Progressing   Problem: Self-Concept: Goal: Will verbalize positive feelings about self Outcome: Progressing Goal: Level of anxiety will decrease Outcome: Progressing   

## 2021-06-24 NOTE — Progress Notes (Signed)
Patient has been pleasant and cooperative. Denies SI, HI and AVH. Continues to be isolative to self

## 2021-06-24 NOTE — BHH Counselor (Signed)
CSW spoke with Trinna Post from [facility] regarding program. He stated that they are no longer taking people who have mental health issues. No other concerns expressed. Contact ended without incident.   Vilma Meckel. Algis Greenhouse, MSW, LCSW, LCAS 06/24/2021 11:30 AM

## 2021-06-24 NOTE — Tx Team (Signed)
Interdisciplinary Treatment and Diagnostic Plan Update  06/24/2021 Time of Session: 8:30AM Jeremy Frank MRN: [id number]  Principal Diagnosis: Severe recurrent major depression with psychotic features [facility])  Secondary Diagnoses: Principal Problem:   Severe recurrent major depression with psychotic features (HCC) Active Problems:   Posttraumatic stress disorder   Traumatic brain injury (HCC)   Cocaine dependence (HCC)   Marijuana abuse   Nicotine dependence   Severe alcohol dependence (HCC)   Current Medications:  Current Facility-Administered Medications  Medication Dose Route Frequency Provider Last Rate Last Admin   acetaminophen (TYLENOL) tablet 650 mg  650 mg Oral Q6H PRN Clapacs, John T, MD       alum & mag hydroxide-simeth (MAALOX/MYLANTA) 200-200-20 MG/5ML suspension 30 mL  30 mL Oral Q4H PRN Clapacs, John T, MD       ARIPiprazole (ABILIFY) tablet 10 mg  10 mg Oral Daily Jesse Sans, MD   10 mg at 06/24/21 0748   divalproex (DEPAKOTE) DR tablet 500 mg  500 mg Oral Q12H Clapacs, John T, MD   500 mg at 06/24/21 0748   magnesium hydroxide (MILK OF MAGNESIA) suspension 30 mL  30 mL Oral Daily PRN Clapacs, John T, MD       mirtazapine (REMERON) tablet 7.5 mg  7.5 mg Oral QHS PRN Jesse Sans, MD   7.5 mg at 06/23/21 2107   multivitamin with minerals tablet 1 tablet  1 tablet Oral Daily Clapacs, Jackquline Denmark, MD   1 tablet at 06/24/21 5974   thiamine tablet 100 mg  100 mg Oral Daily Clapacs, Jackquline Denmark, MD   100 mg at 06/24/21 1638   PTA Medications: Medications Prior to Admission  Medication Sig Dispense Refill Last Dose   divalproex (DEPAKOTE) 500 MG DR tablet Take 1 tablet (500 mg total) by mouth every 12 (twelve) hours. (Patient not taking: No sig reported) 60 tablet 1    gabapentin (NEURONTIN) 300 MG capsule Take 300 mg by mouth 3 (three) times daily. (Patient not taking: No sig reported)      omeprazole (PRILOSEC) 40 MG capsule Take 40 mg by mouth daily. (Patient not  taking: No sig reported)       Patient Stressors: Paediatric nurse issue Marital or family conflict Other: homeless  Patient Strengths: Barrister's clerk for treatment/growth  Treatment Modalities: Medication Management, Group therapy, Case management,  1 to 1 session with clinician, Psychoeducation, Recreational therapy.   Physician Treatment Plan for Primary Diagnosis: Severe recurrent major depression with psychotic features (HCC) Long Term Goal(s): Improvement in symptoms so as ready for discharge   Short Term Goals: Ability to identify changes in lifestyle to reduce recurrence of condition will improve Ability to verbalize feelings will improve Ability to disclose and discuss suicidal ideas Ability to demonstrate self-control will improve Ability to identify and develop effective coping behaviors will improve Ability to maintain clinical measurements within normal limits will improve Compliance with prescribed medications will improve Ability to identify triggers associated with substance abuse/mental health issues will improve  Medication Management: Evaluate patient's response, side effects, and tolerance of medication regimen.  Therapeutic Interventions: 1 to 1 sessions, Unit Group sessions and Medication administration.  Evaluation of Outcomes: Adequate for Discharge  Physician Treatment Plan for Secondary Diagnosis: Principal Problem:   Severe recurrent major depression with psychotic features [facility]) Active Problems:   Posttraumatic stress disorder   Traumatic brain injury (HCC)   Cocaine dependence (HCC)   Marijuana abuse   Nicotine dependence   Severe alcohol  dependence (HCC)  Long Term Goal(s): Improvement in symptoms so as ready for discharge   Short Term Goals: Ability to identify changes in lifestyle to reduce recurrence of condition will improve Ability to verbalize feelings will improve Ability to disclose and discuss suicidal  ideas Ability to demonstrate self-control will improve Ability to identify and develop effective coping behaviors will improve Ability to maintain clinical measurements within normal limits will improve Compliance with prescribed medications will improve Ability to identify triggers associated with substance abuse/mental health issues will improve     Medication Management: Evaluate patient's response, side effects, and tolerance of medication regimen.  Therapeutic Interventions: 1 to 1 sessions, Unit Group sessions and Medication administration.  Evaluation of Outcomes: Adequate for Discharge   RN Treatment Plan for Primary Diagnosis: Severe recurrent major depression with psychotic features (HCC) Long Term Goal(s): Knowledge of disease and therapeutic regimen to maintain health will improve  Short Term Goals: Ability to remain free from injury will improve, Ability to verbalize frustration and anger appropriately will improve, Ability to demonstrate self-control, Ability to participate in decision making will improve, Ability to verbalize feelings will improve, Ability to disclose and discuss suicidal ideas, Ability to identify and develop effective coping behaviors will improve, and Compliance with prescribed medications will improve  Medication Management: RN will administer medications as ordered by provider, will assess and evaluate patient's response and provide education to patient for prescribed medication. RN will report any adverse and/or side effects to prescribing provider.  Therapeutic Interventions: 1 on 1 counseling sessions, Psychoeducation, Medication administration, Evaluate responses to treatment, Monitor vital signs and CBGs as ordered, Perform/monitor CIWA, COWS, AIMS and Fall Risk screenings as ordered, Perform wound care treatments as ordered.  Evaluation of Outcomes: Adequate for Discharge   LCSW Treatment Plan for Primary Diagnosis: Severe recurrent major  depression with psychotic features (HCC) Long Term Goal(s): Safe transition to appropriate next level of care at discharge, Engage patient in therapeutic group addressing interpersonal concerns.  Short Term Goals: Engage patient in aftercare planning with referrals and resources, Increase social support, Increase ability to appropriately verbalize feelings, Increase emotional regulation, Facilitate acceptance of mental health diagnosis and concerns, Facilitate patient progression through stages of change regarding substance use diagnoses and concerns, Identify triggers associated with mental health/substance abuse issues, and Increase skills for wellness and recovery  Therapeutic Interventions: Assess for all discharge needs, 1 to 1 time with Social worker, Explore available resources and support systems, Assess for adequacy in community support network, Educate family and significant other(s) on suicide prevention, Complete Psychosocial Assessment, Interpersonal group therapy.  Evaluation of Outcomes: Adequate for Discharge   Progress in Treatment: Attending groups: Yes. Participating in groups: Yes. Taking medication as prescribed: Yes. Toleration medication: Yes. Family/Significant other contact made: Yes, individual(s) contacted:  SPE attempted with Robby Sermon, Parole Officer.  SPE completed with the patient. Patient understands diagnosis: Yes. Discussing patient identified problems/goals with staff: Yes. Medical problems stabilized or resolved: Yes. Denies suicidal/homicidal ideation: No. Issues/concerns per patient self-inventory: No. Other: none  New problem(s) identified: Yes, Describe:  Shared homicidal and suicidal ideation which has subsided since hospitalization.   Update 06/24/2021: No changes at this time.  New Short Term/Long Term Goal(s): detox, elimination of symptoms of psychosis, medication management for mood stabilization; elimination of SI thoughts; development of  comprehensive mental wellness/sobriety plan. Update 06/24/2021: No changes at this time.  Patient Goals:  "not to feel homicidal or suicidal . . . Go home and self medicate" Update 06/24/2021: No changes  at this time.  Discharge Plan or Barriers: Patient shared intent to return to community and continue illicit drug use to "self medicate" and expressed resistance to treatment. Update 06/24/2021: Patient reports plans to go to the [facility] and follow up with the [facility] for treatment services.   Reason for Continuation of Hospitalization: Aggression Anxiety Depression Suicidal ideation  Estimated Length of Stay: Update 06/24/2021: 1-7 days  Attendees: Patient:  06/24/2021 9:17 AM  Physician: Les Pou, MD 06/24/2021 9:17 AM  Nursing:  06/24/2021 9:17 AM  RN Care Manager:  06/24/2021 9:17 AM  Social Worker: Penni Homans, MSW, LCSW  06/24/2021 9:17 AM  Recreational Therapist:  06/24/2021 9:17 AM  Other:  06/24/2021 9:17 AM  Other:  06/24/2021 9:17 AM  Other: 06/24/2021 9:17 AM    Scribe for Treatment Team: Harden Mo, LCSW 06/24/2021 9:17 AM

## 2021-06-24 NOTE — Discharge Summary (Signed)
Physician Discharge Summary Note  Patient:  Jeremy Frank is an 35 y.o., male MRN:  [id number] DOB:  [birth year] Patient phone:  [phone] (home)  Patient address:   [address] [place] [address],  Total Time spent with patient: 35 minutes- 25 minutes face-to-face contact with patient, 10 minutes documentation, coordination of care, scripts   Date of Admission:  06/17/2021 Date of Discharge: 06/24/2021  Reason for Admission:  35 year old male with PTSD, depression, and substance abuse presenting with hallucinations, suicidal ideations, and homicidal ideations.  Principal Problem: Severe recurrent major depression with psychotic features [facility]) Discharge Diagnoses: Principal Problem:   Severe recurrent major depression with psychotic features (HCC) Active Problems:   Posttraumatic stress disorder   Traumatic brain injury (HCC)   Cocaine dependence (HCC)   Marijuana abuse   Nicotine dependence   Severe alcohol dependence (HCC)   Past Psychiatric History: History of chronic PTSD, depression, and suicidality.  Also has a history of polysubstance abuse with marijuana, cocaine, and alcohol.  He notes he has had prior hospitalizations at the [place].  He has been treated with Paxil and Depakote in the past and found Depakote to be helpful.  He is currently supposed to be receiving outpatient services through the [facility], but transportation has been difficult.  Past Medical History:  Past Medical History:  Diagnosis Date   Drug dependence (HCC)    GI bleed    Hiatal hernia    Hypertension    PTSD (post-traumatic stress disorder)     Past Surgical History:  Procedure Laterality Date   KNEE SURGERY     Family History: History reviewed. No pertinent family history. Family Psychiatric  History: None reported Social History:  Social History   Substance and Sexual Activity  Alcohol Use Yes   Alcohol/week: 42.0 standard drinks   Types: 42 Cans of beer per week   Comment:  per pt he drinks 6-12 cans of beer daily     Social History   Substance and Sexual Activity  Drug Use Yes   Types: Cocaine, Marijuana    Social History   Socioeconomic History   Marital status: Single    Spouse name: Not on file   Number of children: Not on file   Years of education: Not on file   Highest education level: Not on file  Occupational History   Not on file  Tobacco Use   Smoking status: Every Day    Packs/day: 0.25    Pack years: 0.00    Types: Cigarettes   Smokeless tobacco: Never  Vaping Use   Vaping Use: Never used  Substance and Sexual Activity   Alcohol use: Yes    Alcohol/week: 42.0 standard drinks    Types: 42 Cans of beer per week    Comment: per pt he drinks 6-12 cans of beer daily   Drug use: Yes    Types: Cocaine, Marijuana   Sexual activity: Not on file  Other Topics Concern   Not on file  Social History Narrative   Not on file   Social Determinants of Health   Financial Resource Strain: Not on file  Food Insecurity: Not on file  Transportation Needs: Not on file  Physical Activity: Not on file  Stress: Not on file  Social Connections: Not on file    Hospital Course:  35 year old male with PTSD, depression, and substance abuse presenting with hallucinations, suicidal ideations, and homicidal ideations. During admission he was restarted on Depakote 500 mg BID with  level 49. He was also placed on Abilify and titrated to 10 mg daily for hallucinations. During admission mood gradually improved. He no longer endorsed suicidal ideations, homicidal ideations, visual hallucinations, or auditory hallucinations. He continues to have some remaining symptoms of PTSD, but he feels they are much more manageable. He plans to go to the [facility] and follow-up with [facility] to continue outpatient mental health and substance abuse treatment.   Physical Findings: AIMS: Facial and Oral Movements Muscles of Facial Expression: None, normal Lips  and Perioral Area: None, normal Jaw: None, normal Tongue: None, normal,Extremity Movements Upper (arms, wrists, hands, fingers): None, normal Lower (legs, knees, ankles, toes): None, normal, Trunk Movements Neck, shoulders, hips: None, normal, Overall Severity Severity of abnormal movements (highest score from questions above): None, normal Incapacitation due to abnormal movements: None, normal Patient's awareness of abnormal movements (rate only patient's report): No Awareness, Dental Status Current problems with teeth and/or dentures?: No Does patient usually wear dentures?: No  CIWA:    COWS:     Musculoskeletal: Strength & Muscle Tone: within normal limits Gait & Station: normal Patient leans: N/A   Psychiatric Specialty Exam:  Presentation  General Appearance: Appropriate for Environment; Casual  Eye Contact:Good  Speech:Clear and Coherent; Normal Rate  Speech Volume:Normal  Handedness:Right   Mood and Affect  Mood:Euthymic  Affect:Congruent   Thought Process  Thought Processes:Coherent; Goal Directed  Descriptions of Associations:Intact  Orientation:Full (Time, Place and Person)  Thought Content:Logical  History of Schizophrenia/Schizoaffective disorder:No  Duration of Psychotic Symptoms:Greater than six months  Hallucinations:Hallucinations: None  Ideas of Reference:None  Suicidal Thoughts:Suicidal Thoughts: No  Homicidal Thoughts:Homicidal Thoughts: No   Sensorium  Memory:Immediate Good; Recent Good; Remote Good  Judgment:Intact  Insight:Present   Executive Functions  Concentration:Good  Attention Span:Good  Recall:Good  Fund of Knowledge:Good  Language:Good   Psychomotor Activity  Psychomotor Activity:Psychomotor Activity: Normal   Assets  Assets:Communication Skills; Desire for Improvement; Financial Resources/Insurance; Physical Health; Resilience; Social Support; Talents/Skills   Sleep  Sleep:Sleep: Good Number of  Hours of Sleep: 8.5    Physical Exam: Physical Exam ROS Blood pressure 122/83, pulse 70, temperature 99.1 F (37.3 C), temperature source Oral, resp. rate 18, height 5\' 11"  (1.803 m), weight 114.8 kg, SpO2 100 %. Body mass index is 35.29 kg/m.   Social History   Tobacco Use  Smoking Status Every Day   Packs/day: 0.25   Pack years: 0.00   Types: Cigarettes  Smokeless Tobacco Never   Tobacco Cessation:  A prescription for an FDA-approved tobacco cessation medication was offered at discharge and the patient refused   Blood Alcohol level:  Lab Results  Component Value Date   ETH 88 (H) 06/16/2021   ETH 49 (H) 03/13/2021    Metabolic Disorder Labs:  Lab Results  Component Value Date   HGBA1C 5.8 (H) 06/19/2021   MPG 119.76 06/19/2021   No results found for: PROLACTIN Lab Results  Component Value Date   CHOL 145 06/19/2021   TRIG 63 06/19/2021   HDL 60 06/19/2021   CHOLHDL 2.4 06/19/2021   VLDL 13 06/19/2021   LDLCALC 72 06/19/2021    See Psychiatric Specialty Exam and Suicide Risk Assessment completed by Attending Physician prior to discharge.  Discharge destination:  Other:  06/21/2021  Is patient on multiple antipsychotic therapies at discharge:  No   Has Patient had three or more failed trials of antipsychotic monotherapy by history:  No  Recommended Plan for Multiple Antipsychotic Therapies: NA  Discharge Instructions     Diet general   Complete by: As directed    Increase activity slowly   Complete by: As directed       Allergies as of 06/24/2021       Reactions   Cheese Diarrhea   Eggs Or Egg-derived Products Diarrhea   Peanut-containing Drug Products Diarrhea        Medication List     STOP taking these medications    gabapentin 300 MG capsule Commonly known as: NEURONTIN       TAKE these medications      Indication  ARIPiprazole 10 MG tablet Commonly known as: ABILIFY Take 1 tablet (10 mg total) by mouth  daily. Start taking on: June 25, 2021  Indication: Major Depressive Disorder   divalproex 500 MG DR tablet Commonly known as: DEPAKOTE Take 1 tablet (500 mg total) by mouth every 12 (twelve) hours.  Indication: Depressive Phase of Manic-Depression   mirtazapine 7.5 MG tablet Commonly known as: REMERON Take 1 tablet (7.5 mg total) by mouth at bedtime as needed (insomnia).  Indication: Major Depressive Disorder, Insomnia   omeprazole 40 MG capsule Commonly known as: PRILOSEC Take 40 mg by mouth daily.  Indication: Gastroesophageal Reflux Disease        Follow-up Information     [facility].   Specialty: General Practice Contact information: [address] [place] [address] [phone]                 Follow-up recommendations:  Activity:  as tolerated Diet:  regular diet  Comments:  Printed 30-day scripts with 1 refill provided to patient at discharge.   Signed: Jesse Sans, MD 06/24/2021, 9:55 AM

## 2021-11-01 ENCOUNTER — Other Ambulatory Visit: Payer: Self-pay

## 2021-11-01 ENCOUNTER — Encounter: Payer: Self-pay | Admitting: Emergency Medicine

## 2021-11-01 ENCOUNTER — Emergency Department
Admission: EM | Admit: 2021-11-01 | Discharge: 2021-11-03 | Disposition: A | Discharge status: Home / Self Care | Payer: No Typology Code available for payment source | Attending: Student in an Organized Health Care Education/Training Program | Admitting: Student in an Organized Health Care Education/Training Program

## 2021-11-01 DIAGNOSIS — Z046 Encounter for general psychiatric examination, requested by authority: Secondary | ICD-10-CM | POA: Insufficient documentation

## 2021-11-01 DIAGNOSIS — Z79899 Other long term (current) drug therapy: Secondary | ICD-10-CM | POA: Diagnosis not present

## 2021-11-01 DIAGNOSIS — F17210 Nicotine dependence, cigarettes, uncomplicated: Secondary | ICD-10-CM | POA: Diagnosis not present

## 2021-11-01 DIAGNOSIS — F15959 Other stimulant use, unspecified with stimulant-induced psychotic disorder, unspecified: Secondary | ICD-10-CM | POA: Insufficient documentation

## 2021-11-01 DIAGNOSIS — Z20822 Contact with and (suspected) exposure to covid-19: Secondary | ICD-10-CM | POA: Diagnosis not present

## 2021-11-01 DIAGNOSIS — Y900 Blood alcohol level of less than 20 mg/100 ml: Secondary | ICD-10-CM | POA: Diagnosis not present

## 2021-11-01 DIAGNOSIS — F323 Major depressive disorder, single episode, severe with psychotic features: Secondary | ICD-10-CM

## 2021-11-01 DIAGNOSIS — I10 Essential (primary) hypertension: Secondary | ICD-10-CM | POA: Insufficient documentation

## 2021-11-01 DIAGNOSIS — F15951 Other stimulant use, unspecified with stimulant-induced psychotic disorder with hallucinations: Secondary | ICD-10-CM | POA: Diagnosis present

## 2021-11-01 DIAGNOSIS — F29 Unspecified psychosis not due to a substance or known physiological condition: Secondary | ICD-10-CM

## 2021-11-01 LAB — URINE DRUG SCREEN, QUALITATIVE (ARMC ONLY)
Amphetamines, Ur Screen: POSITIVE — AB
Barbiturates, Ur Screen: NOT DETECTED
Benzodiazepine, Ur Scrn: NOT DETECTED
Cannabinoid 50 Ng, Ur ~~LOC~~: POSITIVE — AB
Cocaine Metabolite,Ur ~~LOC~~: NOT DETECTED
MDMA (Ecstasy)Ur Screen: NOT DETECTED
Methadone Scn, Ur: NOT DETECTED
Opiate, Ur Screen: NOT DETECTED
Phencyclidine (PCP) Ur S: NOT DETECTED
Tricyclic, Ur Screen: NOT DETECTED

## 2021-11-01 LAB — ACETAMINOPHEN LEVEL: Acetaminophen (Tylenol), Serum: <10 ug/mL — ABNORMAL LOW (ref 10–30)

## 2021-11-01 LAB — COMPREHENSIVE METABOLIC PANEL
ALT: 22 U/L (ref 0–44)
AST: 34 U/L (ref 15–41)
Albumin: 5.4 g/dL — ABNORMAL HIGH (ref 3.5–5.0)
Alkaline Phosphatase: 74 U/L (ref 38–126)
Anion gap: 16 — ABNORMAL HIGH (ref 5–15)
BUN: 23 mg/dL — ABNORMAL HIGH (ref 6–20)
CO2: 19 mmol/L — ABNORMAL LOW (ref 22–32)
Calcium: 10.1 mg/dL (ref 8.9–10.3)
Chloride: 103 mmol/L (ref 98–111)
Creatinine, Ser: 1.88 mg/dL — ABNORMAL HIGH (ref 0.61–1.24)
GFR, Estimated: 47 mL/min — ABNORMAL LOW (ref >60)
Glucose, Bld: 138 mg/dL — ABNORMAL HIGH (ref 70–99)
Potassium: 3.3 mmol/L — ABNORMAL LOW (ref 3.5–5.1)
Sodium: 138 mmol/L (ref 135–145)
Total Bilirubin: 1.4 mg/dL — ABNORMAL HIGH (ref 0.3–1.2)
Total Protein: 9.8 g/dL — ABNORMAL HIGH (ref 6.5–8.1)

## 2021-11-01 LAB — CBC
HCT: 49.7 % (ref 39.0–52.0)
Hemoglobin: 16.6 g/dL (ref 13.0–17.0)
MCH: 29.2 pg (ref 26.0–34.0)
MCHC: 33.4 g/dL (ref 30.0–36.0)
MCV: 87.5 fL (ref 80.0–100.0)
Platelets: 267 10*3/uL (ref 150–400)
RBC: 5.68 MIL/uL (ref 4.22–5.81)
RDW: 13.4 % (ref 11.5–15.5)
WBC: 15.6 10*3/uL — ABNORMAL HIGH (ref 4.0–10.5)
nRBC: 0 % (ref 0.0–0.2)

## 2021-11-01 LAB — RESP PANEL BY RT-PCR (FLU A&B, COVID) ARPGX2
Influenza A by PCR: NEGATIVE
Influenza B by PCR: NEGATIVE
SARS Coronavirus 2 by RT PCR: NEGATIVE

## 2021-11-01 LAB — SALICYLATE LEVEL: Salicylate Lvl: <7 mg/dL — ABNORMAL LOW (ref 7.0–30.0)

## 2021-11-01 LAB — ETHANOL: Alcohol, Ethyl (B): <10 mg/dL (ref <10)

## 2021-11-01 LAB — TROPONIN I (HIGH SENSITIVITY): Troponin I (High Sensitivity): 9 ng/L (ref <18)

## 2021-11-01 MED ORDER — SODIUM CHLORIDE 0.9 % IV BOLUS: 1000.0000 mL | Freq: Once | INTRAVENOUS | Status: DC

## 2021-11-01 MED ORDER — DIPHENHYDRAMINE HCL 50 MG/ML IJ SOLN
25.0000 mg | Freq: Once | INTRAMUSCULAR | Status: AC
  Administered 2021-11-01: 25 mg via INTRAMUSCULAR
  Filled 2021-11-01: qty 1

## 2021-11-01 MED ORDER — ARIPIPRAZOLE 10 MG PO TABS
10.0000 mg | ORAL_TABLET | Freq: Every day | ORAL | Status: DC
  Filled 2021-11-01 (×2): qty 1

## 2021-11-01 MED ORDER — MIDAZOLAM HCL 2 MG/2ML IJ SOLN
4.0000 mg | Freq: Once | INTRAMUSCULAR | Status: DC
  Filled 2021-11-01: qty 4

## 2021-11-01 MED ORDER — HALOPERIDOL LACTATE 5 MG/ML IJ SOLN
5.0000 mg | Freq: Once | INTRAMUSCULAR | Status: AC
  Administered 2021-11-01: 5 mg via INTRAMUSCULAR
  Filled 2021-11-01: qty 1

## 2021-11-01 NOTE — ED Notes (Signed)
Pt able to be verbally deescalated with help from [place], EDT and [place], security- pt took benadryl and haldol IM with no force needed- pt constantly asking if he is safe or if anyone is here to harm him- pt reassured that he was in a safe place

## 2021-11-01 NOTE — ED Triage Notes (Signed)
Pt comes into the ED via POV c/o psychiatric evaluation.  PT is here voluntary at this time.  Pt presents with severe hallucinations and paranoia.  Pt states he knows he is hallucinating and he is seeing militant people.  Pt states he also saw someone in his hotel room with him.  Pt states that he does have schizophrenia and he normally takes medication, but he has been off of his meds for about a month.  Pt denies any SI or HI, but states "I just feel like my life is in danger and I have no way to defend myself".

## 2021-11-01 NOTE — ED Notes (Signed)
[facility] inpatient admit 

## 2021-11-01 NOTE — BH Assessment (Addendum)
Referral information for Psychiatric Hospitalization faxed to;   [facility] [phone] EXT [phone]) Marcelino Duster AOD reports facility is currently on diversion and not accepting patients  [facility] [phone], press 0 for operator) Bonita Quin AOD reports that facility has limited beds available and staff has not had time to look through referrals, Bonita Quin reports that she will pass along referral to psychiatrist and to contact back later this morning  [facility] [phone], press 0 for operator) No answer  [facility]  [phone], press 0 for operator) Nadine Counts AOD reports facility is currently on diversion and not accepting patients

## 2021-11-01 NOTE — BH Assessment (Signed)
Comprehensive Clinical Assessment (CCA) Note  11/01/2021 Jeremy Frank [id number]  Chief Complaint:  Chief Complaint  Patient presents with   Psychiatric Evaluation   Visit Diagnosis: Psychosis   Jeremy Frank is a 35 year old male who presents to the ER due to having A/H with commands. He is stating they are telling him to harm his self and other people. The voices have increased within the last month. Currently they are preventing him from sleeping and taking care of his daily responsibilities. He is now paranoid and feels unsafe to be around people. He also believes that someone is trying to harm him. He further reports, he hasn't had his medication within the last month. He connected with [employer] and hasn't followed up with his psych appointments. Last night, he used drugs as a means to cope with the voices. He states, the substances have increased his paranoia but he was hearing voices prior him using drugs.  During the interview, the patient was calm, cooperative and pleasant. It appeared he was he was hearing voice, while Probation officer was speaking with him. He made attempts to focus on the conversation but he would look away in the room as if something was said from that direction. He would look back at the writer and asked them to repeat what was said or asked.  CCA Screening, Triage and Referral (STR)  Patient Reported Information How did you hear about [place]? Self  What Is the Reason for Your Visit/Call Today? Patient having AV/H and they are with commands. He hasn't had his medications in over a month.  How Long Has This Been Causing You Problems? 1 wk - 1 month  What Do You Feel Would Help You the Most Today? Treatment for Depression or other mood problem; Alcohol or Drug Use Treatment   Have You Recently Had Any Thoughts About Hurting Yourself? Yes  Are You Planning to Commit Suicide/Harm Yourself At This time? No   Have you Recently Had Thoughts About [place]? No  Are You Planning to Harm Someone at This Time? No  Explanation: No data recorded  Have You Used Any Alcohol or Drugs in the Past 24 Hours? Yes  How Long Ago Did You Use Drugs or Alcohol? No data recorded What Did You Use and How Much? Cocaine, Methamphetamine, cannabis & alcohol. Unknown amount.   Do You Currently Have a Therapist/Psychiatrist? Yes  Name of Therapist/Psychiatrist: [place] Recently Discharged From Any M[employer] or Programs? No  Explanation of Discharge From Practice/Program: No data recorded    CCA Screening Triage Referral Assessment Type of Contact: Face-to-Face  Telemedicine Service Delivery:   Is this Initial or Reassessment? No data recorded Date Telepsych consult ordered in [facility]:  No data recorded Time Telepsych consult ordered in [facility]:  No data recorded Location of Assessment: [facility] ED  Provider Location: [facility] ED   Collateral Involvement: No data recorded  Does Patient Have a [place]? No data recorded Name and Contact of Legal Guardian: No data recorded If Minor and Not Living with Parent(s), Who has Custody? No data recorded Is CPS involved or ever been involved? Never  Is APS involved or ever been involved? Never   Patient Determined To Be At Risk for Harm To Self or Others Based on Review of Patient Reported Information or Presenting Complaint? No  Method: No data recorded Availability of Means: No data recorded Intent: No data recorded Notification Required: No data  recorded Additional Information for Danger to Others Potential: No data recorded Additional Comments for Danger to Others Potential: No data recorded Are There Guns or Other Weapons in [place]? No data recorded Types of Guns/Weapons: No data recorded Are These Weapons Safely Secured?                            No data recorded Who Could Verify You Are Able To Have These Secured: No data  recorded Do You Have any Outstanding Charges, Pending Court Dates, Parole/Probation? No data recorded Contacted To Inform of Risk of Harm To Self or Others: No data recorded   Does Patient Present under Involuntary Commitment? Yes  IVC Papers Initial File Date: 11/01/21   [place] of Residence: [place]   Patient Currently Receiving the Following Services: Medication Management   Determination of Need: Emergent (2 hours)   Options For Referral: Inpatient Hospitalization     CCA Biopsychosocial Patient Reported Schizophrenia/Schizoaffective Diagnosis in Past: Yes   Strengths: Have some insight, wants help, asking to start back on medications.   Mental Health Symptoms Depression:   Hopelessness; Change in energy/activity; Worthlessness   Duration of Depressive symptoms:  Duration of Depressive Symptoms: Greater than two weeks   Mania:   Racing thoughts; Change in energy/activity   Anxiety:    Fatigue; Restlessness; Worrying; Sleep   Psychosis:   Hallucinations   Duration of Psychotic symptoms:  Duration of Psychotic Symptoms: Less than six months   Trauma:   N/A   Obsessions:   N/A   Compulsions:   N/A   Inattention:   N/A   Hyperactivity/Impulsivity:   N/A   Oppositional/Defiant Behaviors:   N/A   Emotional Irregularity:   N/A   Other Mood/Personality Symptoms:  No data recorded   Mental Status Exam Appearance and self-care  Stature:   Average   Weight:   Average weight   Clothing:   Age-appropriate   Grooming:   Normal   Cosmetic use:   None   Posture/gait:   Normal   Motor activity:   -- (Within normal range)   Sensorium  Attention:   Normal   Concentration:   Variable   Orientation:   X5   Recall/memory:   Normal   Affect and Mood  Affect:   Appropriate; Full Range   Mood:   Depressed; Hopeless   Relating  Eye contact:   Fleeting   Facial expression:   Depressed   Attitude toward examiner:    Cooperative   Thought and Language  Speech flow:  Clear and Coherent; Blocked   Thought content:   Appropriate to Mood and Circumstances; Suspicious   Preoccupation:   None   Hallucinations:   Auditory; Visual   Organization:  No data recorded  [employer] of Knowledge:   Fair   Intelligence:   Average   Abstraction:   Functional   Judgement:   Impaired   Reality Testing:   Adequate   Insight:   Fair   Decision Making:   Vacilates   Social Functioning  Social Maturity:   Isolates   Social Judgement:   Normal; "Street Smart"   Stress  Stressors:   Other (Comment)   Coping Ability:   Overwhelmed; Normal   Skill Deficits:   None   Supports:   Family     Religion: Religion/Spirituality Are You A Religious Person?: No  Leisure/Recreation: Leisure / Recreation Do You Have Hobbies?: No  Exercise/Diet: Exercise/Diet Do You Exercise?: No Have You Gained or Lost A Significant Amount of Weight in the Past Six Months?: No Do You Have Any Trouble Sleeping?: Yes Explanation of Sleeping Difficulties: Trouble falling and staying asleep.   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: [employer]  Education: Education Is Patient Currently Attending School?: No Did You Have An Individualized Education Program (IIEP): No Did You Have Any Difficulty At [employer]?: No Patient's Education Has Been Impacted by Current Illness: No   CCA Family/Childhood History Family and Relationship History: Family history Marital status: Single Does patient have children?: Yes How many children?: 1 How is patient's  relationship with their children?: States it is good  Childhood History:  Childhood History By whom was/is the patient raised?: Mother Did patient suffer any verbal/emotional/physical/sexual abuse as a child?: No Did patient suffer from severe childhood neglect?: No Has patient ever been sexually abused/assaulted/raped as an adolescent or adult?: No Was the patient ever a victim of a crime or a disaster?: No Witnessed domestic violence?: No Has patient been affected by domestic violence as an adult?: No  Child/Adolescent Assessment:     CCA Substance Use Alcohol/Drug Use: Alcohol / Drug Use Pain Medications: See PTA Prescriptions: See PTA Over the Counter: See PTA History of alcohol / drug use?: Yes Longest period of sobriety (when/how long): Unable to quantify Substance #1 Name of Substance 1: Cocaine 1 - Last Use / Amount: 10/31/2021 Substance #2 Name of Substance 2: Methamphetamine 2 - Last Use / Amount: 10/31/2021 Substance #3 Name of Substance 3: Cannabis 3 - Last Use / Amount: 10/31/2021 Substance #4 Name of Substance 4: Alcohol 4 - Last Use / Amount: 10/31/2021   ASAM's:  Six Dimensions of Multidimensional Assessment  Dimension 1:  Acute Intoxication and/or Withdrawal Potential:      Dimension 2:  Biomedical Conditions and Complications:      Dimension 3:  Emotional, Behavioral, or Cognitive Conditions and Complications:     Dimension 4:  Readiness to Change:     Dimension 5:  Relapse, Continued use, or Continued Problem Potential:     Dimension 6:  Recovery/Living Environment:     ASAM Severity Score:    ASAM Recommended Level of Treatment:     Substance use Disorder (SUD)    Recommendations for Services/Supports/Treatments:    Discharge Disposition:    DSM5 Diagnoses: Patient Active Problem List   Diagnosis Date Noted   Cannabis dependence, continuous (HCC) 06/18/2021   Alcohol abuse with alcohol-induced mood disorder (HCC) 06/18/2021    Crohn's disease (HCC) 06/18/2021   Infected abrasion or friction burn of foot and toe 06/18/2021   Nicotine dependence 06/18/2021   Other personal history presenting hazards to health 06/18/2021   Peptic ulcer 06/18/2021   Severe alcohol dependence (HCC) 06/18/2021   Severe recurrent major depression with psychotic features (HCC) 06/17/2021   Major depressive disorder, recurrent episode, severe, with psychosis (HCC) 06/16/2021   Suicidal ideations 06/16/2021   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   Hypertension 10/19/2020   Psychosis (HCC) 06/13/2015   Posttraumatic stress disorder 06/13/2015   Traumatic brain injury (HCC) 06/13/2015   Cocaine dependence (HCC) 06/13/2015   Marijuana abuse 06/13/2015    Referrals to Alternative Service(s): Referred  to Alternative Service(s):   Place:   Date:   Time:    Referred to Alternative Service(s):   Place:   Date:   Time:    Referred to Alternative Service(s):   Place:   Date:   Time:    Referred to Alternative Service(s):   Place:   Date:   Time:     Gunnar Fusi MS, LCAS, [facility], [facility] Therapeutic Triage Specialist 11/01/2021 5:04 PM

## 2021-11-01 NOTE — ED Notes (Addendum)
Pt. Transferred to BHU from ED to room 4 after screening for contraband. Report to include Situation, Background, Assessment and Recommendations from [employer] Pt. Oriented to unit including Q15 minute rounds as well as the security cameras for their protection. Patient is alert and oriented, warm and dry in no acute distress. Patient denies SI and HI. Reported AVH. Pt. Encouraged to let me know if needs arise.

## 2021-11-01 NOTE — ED Provider Notes (Signed)
[facility] Emergency Department Provider Note    Event Date/Time   First MD Initiated Contact with Patient 11/01/21 [phone]     (approximate)  I have reviewed the triage vital signs and the nursing notes.   HISTORY  Chief Complaint Psychiatric Evaluation  Level V caveat:  acute psychosis  HPI Jeremy Frank is a 35 y.o. male below listed past medical history presents to the ER for evaluation of paranoia and anxiety.  Patient very paranoid hallucinating stating that he thinks the [place] is coming after him.  Does not feel safe.  He called police who brought him to the ER.  States has been off of his psychiatric medications for quite some time.  Does have a history of polysubstance abuse.  Denies any pain.  No nausea or vomiting.  No chest pain or shortness of breath.  No fevers.  Past Medical History:  Diagnosis Date  . Drug dependence ([place])   . GI bleed   . Hiatal hernia   . Hypertension   . PTSD (post-traumatic stress disorder)    History reviewed. No pertinent family history. Past Surgical History:  Procedure Laterality Date  . KNEE SURGERY     Patient Active Problem List   Diagnosis Date Noted  . Cannabis dependence, continuous ([place]) 06/18/2021  . Alcohol abuse with alcohol-induced mood disorder ([place]) 06/18/2021  . Crohn's disease ([place]) 06/18/2021  . Infected abrasion or friction burn of foot and toe 06/18/2021  . Nicotine dependence 06/18/2021  . Other personal history presenting hazards to health 06/18/2021  . Peptic ulcer 06/18/2021  . Severe alcohol dependence ([place]) 06/18/2021  . Severe recurrent major depression with psychotic features ([place]) 06/17/2021  . Major depressive disorder, recurrent episode, severe, with psychosis ([place]) 06/16/2021  . Suicidal ideations 06/16/2021  . Amphetamine and psychostimulant-induced psychosis with hallucinations ([place]) 10/19/2020  . Hypertension 10/19/2020  . Psychosis ([place]) 06/13/2015  . Posttraumatic  stress disorder 06/13/2015  . Traumatic brain injury ([place]) 06/13/2015  . Cocaine dependence ([place]) 06/13/2015  . Marijuana abuse 06/13/2015      Prior to Admission medications   Medication Sig Start Date End Date Taking? Authorizing Provider  ARIPiprazole (ABILIFY) 10 MG tablet Take 1 tablet (10 mg total) by mouth daily. 06/25/21   Salley Scarlet, MD  divalproex (DEPAKOTE) 500 MG DR tablet Take 1 tablet (500 mg total) by mouth every 12 (twelve) hours. 06/24/21   Salley Scarlet, MD  mirtazapine (REMERON) 7.5 MG tablet Take 1 tablet (7.5 mg total) by mouth at bedtime as needed (insomnia). 06/24/21   Salley Scarlet, MD  omeprazole (PRILOSEC) 40 MG capsule Take 40 mg by mouth daily. 04/18/20   [provider]    Allergies Cheese, Eggs or egg-derived products, and Peanut-containing drug products    Social History Social History   Tobacco Use  . Smoking status: Every Day    Packs/day: 0.25    Types: Cigarettes  . Smokeless tobacco: Never  Vaping Use  . Vaping Use: Never used  Substance Use Topics  . Alcohol use: Yes    Alcohol/week: 42.0 standard drinks    Types: 42 Cans of beer per week    Comment: per pt he drinks 6-12 cans of beer daily  . Drug use: Yes    Types: Cocaine, Marijuana    Review of Systems Patient denies headaches, rhinorrhea, blurry vision, numbness, shortness of breath, chest pain, edema, cough, abdominal pain, nausea, vomiting, diarrhea, dysuria, fevers, rashes or hallucinations unless otherwise  stated above in HPI. ____________________________________________   PHYSICAL EXAM:  VITAL SIGNS: Vitals:   11/01/21 0807 11/01/21 1341  BP: (!) 134/114 120/83  Pulse: (!) 124 96  Resp: 20 18  Temp: 98.2 F (36.8 C) 99.1 F (37.3 C)  SpO2: 98% 97%    Constitutional: Alert,  Diaphoretic, anxious appearing Eyes: Conjunctivae are normal. Pupils dilated Head: Atraumatic. Nose: No congestion/rhinnorhea. Mouth/Throat: Mucous membranes are moist.    Neck: No stridor. Painless ROM.  Cardiovascular: mildly tachycardic, regular rhythm. Grossly normal heart sounds.  Good peripheral circulation. Respiratory: Normal respiratory effort.  No retractions. Lungs CTAB. Gastrointestinal: Soft and nontender. No distention. No abdominal bruits. No CVA tenderness. Genitourinary:  Musculoskeletal: No lower extremity tenderness nor edema.  No joint effusions. Neurologic:  no clonus, no rigidity, able to ambulate with steady gait No gross focal neurologic deficits are appreciated. No facial droop Skin:  Skin is warm, diaphoretic, and intact. No rash noted. Psychiatric:  hypervigilant, anxious appearing, disheveled  ____________________________________________   LABS (all labs ordered are listed, but only abnormal results are displayed)  Results for orders placed or performed during the hospital encounter of 11/01/21 (from the past 24 hour(s))  Comprehensive metabolic panel     Status: Abnormal   Collection Time: 11/01/21  8:13 AM  Result Value Ref Range   Sodium 138 135 - 145 mmol/L   Potassium 3.3 (L) 3.5 - 5.1 mmol/L   Chloride 103 98 - 111 mmol/L   CO2 19 (L) 22 - 32 mmol/L   Glucose, Bld 138 (H) 70 - 99 mg/dL   BUN 23 (H) 6 - 20 mg/dL   Creatinine, Ser 1.88 (H) 0.61 - 1.24 mg/dL   Calcium 10.1 8.9 - 10.3 mg/dL   Total Protein 9.8 (H) 6.5 - 8.1 g/dL   Albumin 5.4 (H) 3.5 - 5.0 g/dL   AST 34 15 - 41 U/L   ALT 22 0 - 44 U/L   Alkaline Phosphatase 74 38 - 126 U/L   Total Bilirubin 1.4 (H) 0.3 - 1.2 mg/dL   GFR, Estimated 47 (L) >60 mL/min   Anion gap 16 (H) 5 - 15  Ethanol     Status: None   Collection Time: 11/01/21  8:13 AM  Result Value Ref Range   Alcohol, Ethyl (B) Q000111Q Q000111Q mg/dL  Salicylate level     Status: Abnormal   Collection Time: 11/01/21  8:13 AM  Result Value Ref Range   Salicylate Lvl Q000111Q (L) 7.0 - 30.0 mg/dL  Acetaminophen level     Status: Abnormal   Collection Time: 11/01/21  8:13 AM  Result Value Ref Range    Acetaminophen (Tylenol), Serum <10 (L) 10 - 30 ug/mL  cbc     Status: Abnormal   Collection Time: 11/01/21  8:13 AM  Result Value Ref Range   WBC 15.6 (H) 4.0 - 10.5 K/uL   RBC 5.68 4.22 - 5.81 MIL/uL   Hemoglobin 16.6 13.0 - 17.0 g/dL   HCT 49.7 39.0 - 52.0 %   MCV 87.5 80.0 - 100.0 fL   MCH 29.2 26.0 - 34.0 pg   MCHC 33.4 30.0 - 36.0 g/dL   RDW 13.4 11.5 - 15.5 %   Platelets 267 150 - 400 K/uL   nRBC 0.0 0.0 - 0.2 %  Troponin I (High Sensitivity)     Status: None   Collection Time: 11/01/21  8:13 AM  Result Value Ref Range   Troponin I (High Sensitivity) 9 <18 ng/L  Urine Drug Screen,  Qualitative     Status: Abnormal   Collection Time: 11/01/21  8:52 AM  Result Value Ref Range   Tricyclic, Ur Screen NONE DETECTED NONE DETECTED   Amphetamines, Ur Screen POSITIVE (A) NONE DETECTED   MDMA (Ecstasy)Ur Screen NONE DETECTED NONE DETECTED   Cocaine Metabolite,Ur [place] NONE DETECTED NONE DETECTED   Opiate, Ur Screen NONE DETECTED NONE DETECTED   Phencyclidine (PCP) Ur S NONE DETECTED NONE DETECTED   Cannabinoid 50 Ng, Ur [place] POSITIVE (A) NONE DETECTED   Barbiturates, Ur Screen NONE DETECTED NONE DETECTED   Benzodiazepine, Ur Scrn NONE DETECTED NONE DETECTED   Methadone Scn, Ur NONE DETECTED NONE DETECTED   ____________________________________________  EKG My review and personal interpretation at Time: 12:47   Indication: diaphoresis  Rate: 110  Rhythm: sinus Axis: normal Other: nonspecific st and t wave abn, no stemi ____________________________________________  RADIOLOGY  I personally reviewed all radiographic images ordered to evaluate for the above acute complaints and reviewed radiology reports and findings.  These findings were personally discussed with the patient.  Please see medical record for radiology report.  ____________________________________________   PROCEDURES  Procedure(s) performed:  .Critical Care Performed by: Merlyn Lot, MD Authorized by:  Merlyn Lot, MD   Critical care provider statement:    Critical care time (minutes):  34   Critical care was necessary to treat or prevent imminent or life-threatening deterioration of the following conditions: acute psychosis requiring calming agent and monitoring.   Critical care was time spent personally by me on the following activities:  Ordering and performing treatments and interventions, ordering and review of laboratory studies, ordering and review of radiographic studies, pulse oximetry, re-evaluation of patient's condition, review of old charts, obtaining history from patient or surrogate, examination of patient, evaluation of patient's response to treatment, discussions with primary provider, discussions with consultants and development of treatment plan with patient or surrogate    Critical Care performed: yes ____________________________________________   INITIAL IMPRESSION / [place] / ED COURSE  Pertinent labs & imaging results that were available during my care of the patient were reviewed by me and considered in my medical decision making (see chart for details).   DDX: Psychosis, delirium, medication effect, noncompliance, polysubstance abuse, Si, Hi, depression   Jeremy Frank is a 35 y.o. who presents to the ED with for evaluation of acute agitation and paranoia with hallucinations.  Patient has psych history of schizophrenia and substance abuse.  Patient was made IVC and due to his acute agitation very disorganized for his safety due to his increasing agitation was given IM calming agent so that we can more completely evaluate him..  Laboratory testing was ordered to evaluation for underlying electrolyte derangement or signs of underlying organic pathology to explain today's presentation.  Based on history and physical and laboratory evaluation, it appears that the patient's presentation is 2/2 underlying psychiatric disorder and will require further  evaluation and management by inpatient psychiatry.  Disposition pending psychiatric evaluation.    The patient has been placed in psychiatric observation due to the need to provide a safe environment for the patient while obtaining psychiatric consultation and evaluation, as well as ongoing medical and medication management to treat the patient's condition.  The patient has been placed under full IVC at this time.   The patient was evaluated in Emergency Department today for the symptoms described in the history of present illness. He/she was evaluated in the context of the global COVID-19 pandemic, which necessitated consideration that  the patient might be at risk for infection with the SARS-CoV-2 virus that causes COVID-19. Institutional protocols and algorithms that pertain to the evaluation of patients at risk for COVID-19 are in a state of rapid change based on information released by regulatory bodies including the CDC and federal and state organizations. These policies and algorithms were followed during the patient's care in the ED.  As part of my medical decision making, I reviewed the following data within the electronic MEDICAL RECORD NUMBER Nursing notes reviewed and incorporated, Labs reviewed, notes from prior ED visits and [place] Controlled Substance Database   ____________________________________________   FINAL CLINICAL IMPRESSION(S) / ED DIAGNOSES  Final diagnoses:  Psychosis, unspecified psychosis type (HCC)      NEW MEDICATIONS STARTED DURING THIS VISIT:  New Prescriptions   No medications on file     Note:  This document was prepared using Dragon voice recognition software and may include unintentional dictation errors.    Willy Eddy, MD 11/01/21 1453

## 2021-11-01 NOTE — ED Notes (Signed)

## 2021-11-01 NOTE — ED Notes (Signed)
Hourly rounding reveals patient in room. No complaints, stable, in no acute distress. Q15 minute rounds and monitoring via Security Cameras to continue. 

## 2021-11-01 NOTE — Consult Note (Signed)
[facility] Face-to-Face Psychiatry Consult   Reason for Consult:  psychosis Referring Physician:  Dr. Roxan Hockey Patient Identification: Jeremy Frank MRN:  [id number] Principal Diagnosis: <principal problem not specified> Diagnosis:  Active Problems:   * No active hospital problems. *   Total Time spent with patient: 45 minutes  Subjective:   Jeremy Frank is a 35 y.o. male patient with psych history of PTSD, depression, substance use disorder, who was admitted to ER with with hallucinations and paranoia.  HPI:   Patient reports he is experiencing auditory and visual hallucination - voices commanding him to hurt himself and seeing militant people. He disclosed not taking psychotropic medications for about a month. He also admitted to substance use - amphetamines and THC. He reports hallucinations make him feel depressed and anxious. Reports also feeling paranoid that he is in danger. He denies feeling suicidal or homicidal, although reports feeling not feeling safe from impulsively hurting self due to commanding voices. He is a Administrator, [employer] and has outpatient mental health via [facility] clinic, states he had not been there for a while and does not have scheduled appointment, although they have a walk-in clinic. He feel not safe outside the hospital and is willing to be admitted for safety and stabilization on medications. He denies any current physical complaints. He agreed to restart Abilify at a dose 10mg  daily.  Past Psychiatric History:  Dx: MDD, anxiety, PTSD, cocaine use d/o, stimulant use disorder. Reports h/o past suicidal attempts and psych admissions. Past med trials: Abilify, Depakote, Prazosin, Haldol, Risperidone.  Risk to Self:   Risk to Others:   Prior Inpatient Therapy:   Prior Outpatient Therapy:    Past Medical History:  Past Medical History:  Diagnosis Date   Drug dependence (HCC)    GI bleed    Hiatal hernia    Hypertension    PTSD (post-traumatic stress disorder)     Past  Surgical History:  Procedure Laterality Date   KNEE SURGERY     Family History: History reviewed. No pertinent family history. Family Psychiatric  History: unknown Social History:  Social History   Substance and Sexual Activity  Alcohol Use Yes   Alcohol/week: 42.0 standard drinks   Types: 42 Cans of beer per week   Comment: per pt he drinks 6-12 cans of beer daily     Social History   Substance and Sexual Activity  Drug Use Yes   Types: Cocaine, Marijuana    Social History   Socioeconomic History   Marital status: Single    Spouse name: Not on file   Number of children: Not on file   Years of education: Not on file   Highest education level: Not on file  Occupational History   Not on file  Tobacco Use   Smoking status: Every Day    Packs/day: 0.25    Types: Cigarettes   Smokeless tobacco: Never  Vaping Use   Vaping Use: Never used  Substance and Sexual Activity   Alcohol use: Yes    Alcohol/week: 42.0 standard drinks    Types: 42 Cans of beer per week    Comment: per pt he drinks 6-12 cans of beer daily   Drug use: Yes    Types: Cocaine, Marijuana   Sexual activity: Not on file  Other Topics Concern   Not on file  Social History Narrative   Not on file   Social Determinants of Health   Financial Resource Strain: Not on file  Food Insecurity: Not  on file  Transportation Needs: Not on file  Physical Activity: Not on file  Stress: Not on file  Social Connections: Not on file   Additional Social History:    Allergies:   Allergies  Allergen Reactions   Cheese Diarrhea   Eggs Or Egg-Derived Products Diarrhea   Peanut-Containing Drug Products Diarrhea    Labs:  Results for orders placed or performed during the hospital encounter of 11/01/21 (from the past 48 hour(s))  Comprehensive metabolic panel     Status: Abnormal   Collection Time: 11/01/21  8:13 AM  Result Value Ref Range   Sodium 138 135 - 145 mmol/L   Potassium 3.3 (L) 3.5 - 5.1 mmol/L    Chloride 103 98 - 111 mmol/L   CO2 19 (L) 22 - 32 mmol/L   Glucose, Bld 138 (H) 70 - 99 mg/dL    Comment: Glucose reference range applies only to samples taken after fasting for at least 8 hours.   BUN 23 (H) 6 - 20 mg/dL   Creatinine, Ser 1.88 (H) 0.61 - 1.24 mg/dL   Calcium 10.1 8.9 - 10.3 mg/dL   Total Protein 9.8 (H) 6.5 - 8.1 g/dL   Albumin 5.4 (H) 3.5 - 5.0 g/dL   AST 34 15 - 41 U/L   ALT 22 0 - 44 U/L   Alkaline Phosphatase 74 38 - 126 U/L   Total Bilirubin 1.4 (H) 0.3 - 1.2 mg/dL   GFR, Estimated 47 (L) >60 mL/min    Comment: (NOTE) Calculated using the CKD-EPI Creatinine Equation (2021)    Anion gap 16 (H) 5 - 15    Comment: Performed at [facility], [address] [place], [place] [address]  Ethanol     Status: None   Collection Time: 11/01/21  8:13 AM  Result Value Ref Range   Alcohol, Ethyl (B) <10 <10 mg/dL    Comment: (NOTE) Lowest detectable limit for serum alcohol is 10 mg/dL.  For medical purposes only. Performed at [facility], [address] [place], [place] [id number]   Salicylate level     Status: Abnormal   Collection Time: 11/01/21  8:13 AM  Result Value Ref Range   Salicylate Lvl Q000111Q (L) 7.0 - 30.0 mg/dL    Comment: Performed at [facility], [address] [place], [place] [address]  Acetaminophen level     Status: Abnormal   Collection Time: 11/01/21  8:13 AM  Result Value Ref Range   Acetaminophen (Tylenol), Serum <10 (L) 10 - 30 ug/mL    Comment: (NOTE) Therapeutic concentrations vary significantly. A range of 10-30 ug/mL  may be an effective concentration for many patients. However, some  are best treated at concentrations outside of this range. Acetaminophen concentrations >150 ug/mL at 4 hours after ingestion  and >50 ug/mL at 12 hours after ingestion are often associated with  toxic reactions.  Performed at [facility], [address] [place], [place] [address]   cbc     Status: Abnormal    Collection Time: 11/01/21  8:13 AM  Result Value Ref Range   WBC 15.6 (H) 4.0 - 10.5 K/uL   RBC 5.68 4.22 - 5.81 MIL/uL   Hemoglobin 16.6 13.0 - 17.0 g/dL   HCT 49.7 39.0 - 52.0 %   MCV 87.5 80.0 - 100.0 fL   MCH 29.2 26.0 - 34.0 pg   MCHC 33.4 30.0 - 36.0 g/dL   RDW 13.4 11.5 - 15.5 %   Platelets 267  150 - 400 K/uL   nRBC 0.0 0.0 - 0.2 %    Comment: Performed at [facility], [place], [place] [address]  Troponin I (High Sensitivity)     Status: None   Collection Time: 11/01/21  8:13 AM  Result Value Ref Range   Troponin I (High Sensitivity) 9 <18 ng/L    Comment: (NOTE) Elevated high sensitivity troponin I (hsTnI) values and significant  changes across serial measurements may suggest ACS but many other  chronic and acute conditions are known to elevate hsTnI results.  Refer to the "Links" section for chest pain algorithms and additional  guidance. Performed at [facility], [address] [place], [place] [address]   Urine Drug Screen, Qualitative     Status: Abnormal   Collection Time: 11/01/21  8:52 AM  Result Value Ref Range   Tricyclic, Ur Screen NONE DETECTED NONE DETECTED   Amphetamines, Ur Screen POSITIVE (A) NONE DETECTED   MDMA (Ecstasy)Ur Screen NONE DETECTED NONE DETECTED   Cocaine Metabolite,Ur [place] NONE DETECTED NONE DETECTED   Opiate, Ur Screen NONE DETECTED NONE DETECTED   Phencyclidine (PCP) Ur S NONE DETECTED NONE DETECTED   Cannabinoid 50 Ng, Ur [place] POSITIVE (A) NONE DETECTED   Barbiturates, Ur Screen NONE DETECTED NONE DETECTED   Benzodiazepine, Ur Scrn NONE DETECTED NONE DETECTED   Methadone Scn, Ur NONE DETECTED NONE DETECTED    Comment: (NOTE) Tricyclics + metabolites, urine    Cutoff 1000 ng/mL Amphetamines + metabolites, urine  Cutoff 1000 ng/mL MDMA (Ecstasy), urine              Cutoff 500 ng/mL Cocaine Metabolite, urine          Cutoff 300 ng/mL Opiate + metabolites, urine        Cutoff 300  ng/mL Phencyclidine (PCP), urine         Cutoff 25 ng/mL Cannabinoid, urine                 Cutoff 50 ng/mL Barbiturates + metabolites, urine  Cutoff 200 ng/mL Benzodiazepine, urine              Cutoff 200 ng/mL Methadone, urine                   Cutoff 300 ng/mL  The urine drug screen provides only a preliminary, unconfirmed analytical test result and should not be used for non-medical purposes. Clinical consideration and professional judgment should be applied to any positive drug screen result due to possible interfering substances. A more specific alternate chemical method must be used in order to obtain a confirmed analytical result. Gas chromatography / mass spectrometry (GC/MS) is the preferred confirm atory method. Performed at [facility], [address] [place],  [address]     Current Facility-Administered Medications  Medication Dose Route Frequency Provider Last Rate Last Admin   ARIPiprazole (ABILIFY) tablet 10 mg  10 mg Oral Daily Merlyn Lot, MD       midazolam (VERSED) injection 4 mg  4 mg Intramuscular Once Merlyn Lot, MD       Current Outpatient Medications  Medication Sig Dispense Refill   ARIPiprazole (ABILIFY) 10 MG tablet Take 1 tablet (10 mg total) by mouth daily. 7 tablet 0   divalproex (DEPAKOTE) 500 MG DR tablet Take 1 tablet (500 mg total) by mouth every 12 (twelve) hours. 60 tablet 1   mirtazapine (REMERON) 7.5 MG tablet Take 1 tablet (7.5 mg total) by mouth  at bedtime as needed (insomnia). 30 tablet 1   omeprazole (PRILOSEC) 40 MG capsule Take 40 mg by mouth daily.      Musculoskeletal: Strength & Muscle Tone: within normal limits Gait & Station: normal Patient leans: N/A    Psychiatric Specialty Exam: MENTAL STATUS EXAM: Appearance:  AAM, appearing stated age, appears well-nourished;  wearing appropriate hospital clothes, with fair grooming and hygiene. Normal level of alertness and appropriate facial  expression.  Attitude/Behavior: calm, cooperative, engaging with appropriate eye contact.  Motor: WNL; dyskinesias not evident. Gait appears in full range.  Speech: spontaneous, clear, coherent, normal comprehension.  Mood: "I feel unsafe".  Affect: restricted.  Thought process: patient appears coherent, mostly-organized.  Thought content: patient denies suicidal thoughts, denies homicidal thoughts; expresses paranoid delusions.  Thought perception: patient reports auditory and visual hallucinations. Did not appear internally stimulated during the interview.  Cognition: patient is alert and oriented in self, place, date.  Insight: fair, in regards of understanding of presence, nature, cause, and significance of mental or emotional problem.  Judgement: limited, in regards of ability to make good decisions concerning the appropriate thing to do in various situations, including ability to form opinions regarding their mental health condition.   Physical Exam: Physical Exam ROS Blood pressure 120/83, pulse 96, temperature 99.1 F (37.3 C), temperature source Oral, resp. rate 18, height 5\' 11"  (1.803 m), weight 114.8 kg, SpO2 97 %. Body mass index is 35.3 kg/m.  Treatment Plan Summary: Daily contact with patient to assess and evaluate symptoms and progress in treatment and Medication management  ASSESSMENT: 35yo M with h/o depression, ptsd, substance use disorder, presenting to ER with hallucinations, paranoia in settings of being off psych medication for a month and substance abuse. During my assessment, patient expresses auditory and visual hallucinations and paranoid delusions. Cannot contract for safety if discharged. Agreed to restart Abilify.    -recommended for inpatient psychiatric hospitalization (will be referred to [facility] first).  -he is voluntary  -restart Abilify 10mg  po daily  -Psych Consult service will follow while here.   Disposition: Recommend psychiatric  Inpatient admission when medically cleared.   Larita Fife, MD 11/01/2021 2:05 PM

## 2021-11-01 NOTE — ED Notes (Signed)
Behavioral med at bedside for pt evaluation

## 2021-11-02 DIAGNOSIS — F15951 Other stimulant use, unspecified with stimulant-induced psychotic disorder with hallucinations: Secondary | ICD-10-CM

## 2021-11-02 MED ORDER — DIVALPROEX SODIUM 500 MG PO DR TAB
500.0000 mg | DELAYED_RELEASE_TABLET | Freq: Two times a day (BID) | ORAL | Status: DC
  Administered 2021-11-02 – 2021-11-03 (×3): 500 mg via ORAL
  Filled 2021-11-02 (×3): qty 1

## 2021-11-02 MED ORDER — GABAPENTIN 300 MG PO CAPS
300.0000 mg | ORAL_CAPSULE | Freq: Three times a day (TID) | ORAL | Status: DC
  Administered 2021-11-02 – 2021-11-03 (×3): 300 mg via ORAL
  Filled 2021-11-02 (×3): qty 1

## 2021-11-02 MED ORDER — OLANZAPINE 10 MG PO TABS: 10.0000 mg | ORAL_TABLET | Freq: Two times a day (BID) | ORAL | Status: DC

## 2021-11-02 NOTE — ED Notes (Signed)
Hourly rounding reveals patient in room. No complaints, stable, in no acute distress. Q15 minute rounds and monitoring via Security Cameras to continue. 

## 2021-11-02 NOTE — Consult Note (Signed)
[facility] Consult   Reason for Consult:  psychosis Referring Physician:  Dr. Quentin Cornwall Patient Identification: Jeremy Frank MRN:  [id number] Principal Diagnosis: Methamphetamine induced psychosis Diagnosis:  Methamphetamine induced psychosis  Total Time spent with patient: 30 minutes  Subjective:   Jeremy Frank is a 35 y.o. male patient with psych history of PTSD, depression, substance use disorder, who was admitted to ER with with hallucinations and paranoia.  Client is calm, clear and coherent today with no suicidal/homicidal ideations, hallucinations, or withdrawal symptoms.  Medications started and will monitor to see if he maintains his stability for discharge.    HPI per Dr Danella Sensing on admission:   Patient reports he is experiencing auditory and visual hallucination - voices commanding him to hurt himself and seeing militant people. He disclosed not taking psychotropic medications for about a month. He also admitted to substance use - amphetamines and THC. He reports hallucinations make him feel depressed and anxious. Reports also feeling paranoid that he is in danger. He denies feeling suicidal or homicidal, although reports feeling not feeling safe from impulsively hurting self due to commanding voices. 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 He feel not safe outside the hospital and is willing to be admitted for safety and stabilization on medications. He denies any current physical complaints. He agreed to restart Abilify at a dose 10mg  daily.  Past Psychiatric History:  Dx: MDD, anxiety, PTSD, cocaine use d/o, stimulant use disorder. Reports h/o past suicidal attempts and psych admissions. Past med trials: Abilify, Depakote, Prazosin, Haldol, Risperidone.  Risk to Self:  none Risk to Others:  none Prior Inpatient  Therapy:  several Prior Outpatient Therapy:  ACT team in the past  Past Medical History:  Past Medical History:  Diagnosis Date   Drug dependence ([place])    GI bleed    Hiatal hernia    Hypertension    PTSD (post-traumatic stress disorder)     Past Surgical History:  Procedure Laterality Date   KNEE SURGERY     Family History: History reviewed. No pertinent family history. Family Psychiatric  History: unknown Social History:  Social History   Substance and Sexual Activity  Alcohol Use Yes   Alcohol/week: 42.0 standard drinks   Types: 42 Cans of beer per week   Comment: per pt he drinks 6-12 cans of beer daily     Social History   Substance and Sexual Activity  Drug Use Yes   Types: Cocaine, Marijuana    Social History   Socioeconomic History   Marital status: Single    Spouse name: Not on file   Number of children: Not on file   Years of education: Not on file   Highest education level: Not on file  Occupational History   Not on file  Tobacco Use   Smoking status: Every Day    Packs/day: 0.25    Types: Cigarettes   Smokeless tobacco: Never  Vaping Use   Vaping Use: Never used  Substance and Sexual Activity   Alcohol use: Yes    Alcohol/week: 42.0 standard drinks    Types: 42 Cans of beer per week    Comment: per pt he drinks 6-12 cans of beer daily   Drug use: Yes    Types: Cocaine, Marijuana   Sexual activity: Not on file  Other Topics  Concern   Not on file  Social History Narrative   Not on file   Social Determinants of Health   Financial Resource Strain: Not on file  Food Insecurity: Not on file  Transportation Needs: Not on file  Physical Activity: Not on file  Stress: Not on file  Social Connections: Not on file   Additional Social History:    Allergies:   Allergies  Allergen Reactions   Cheese Diarrhea   Eggs Or Egg-Derived Products Diarrhea   Peanut-Containing Drug Products Diarrhea    Labs:  Results for orders placed or  performed during the hospital encounter of 11/01/21 (from the past 48 hour(s))  Comprehensive metabolic panel     Status: Abnormal   Collection Time: 11/01/21  8:13 AM  Result Value Ref Range   Sodium 138 135 - 145 mmol/L   Potassium 3.3 (L) 3.5 - 5.1 mmol/L   Chloride 103 98 - 111 mmol/L   CO2 19 (L) 22 - 32 mmol/L   Glucose, Bld 138 (H) 70 - 99 mg/dL    Comment: Glucose reference range applies only to samples taken after fasting for at least 8 hours.   BUN 23 (H) 6 - 20 mg/dL   Creatinine, Ser 8.75 (H) 0.61 - 1.24 mg/dL   Calcium 64.3 8.9 - 32.9 mg/dL   Total Protein 9.8 (H) 6.5 - 8.1 g/dL   Albumin 5.4 (H) 3.5 - 5.0 g/dL   AST 34 15 - 41 U/L   ALT 22 0 - 44 U/L   Alkaline Phosphatase 74 38 - 126 U/L   Total Bilirubin 1.4 (H) 0.3 - 1.2 mg/dL   GFR, Estimated 47 (L) >60 mL/min    Comment: (NOTE) Calculated using the CKD-EPI Creatinine Equation (2021)    Anion gap 16 (H) 5 - 15    Comment: Performed at [facility], [address] [place], [place] [address]  Ethanol     Status: None   Collection Time: 11/01/21  8:13 AM  Result Value Ref Range   Alcohol, Ethyl (B) <10 <10 mg/dL    Comment: (NOTE) Lowest detectable limit for serum alcohol is 10 mg/dL.  For medical purposes only. Performed at [facility], [address] [place], [place] [address]   Salicylate level     Status: Abnormal   Collection Time: 11/01/21  8:13 AM  Result Value Ref Range   Salicylate Lvl <7.0 (L) 7.0 - 30.0 mg/dL    Comment: Performed at [facility], [address] [place], [place] [address]  Acetaminophen level     Status: Abnormal   Collection Time: 11/01/21  8:13 AM  Result Value Ref Range   Acetaminophen (Tylenol), Serum <10 (L) 10 - 30 ug/mL    Comment: (NOTE) Therapeutic concentrations vary significantly. A range of 10-30 ug/mL  may be an effective concentration for many patients. However, some  are best treated at concentrations outside of this  range. Acetaminophen concentrations >150 ug/mL at 4 hours after ingestion  and >50 ug/mL at 12 hours after ingestion are often associated with  toxic reactions.  Performed at [facility], [address] [place], [place] [address]   cbc     Status: Abnormal   Collection Time: 11/01/21  8:13 AM  Result Value Ref Range   WBC 15.6 (H) 4.0 - 10.5 K/uL   RBC 5.68 4.22 - 5.81 MIL/uL   Hemoglobin 16.6 13.0 - 17.0 g/dL   HCT 35.5 73.2 - 20.2 %   MCV  87.5 80.0 - 100.0 fL   MCH 29.2 26.0 - 34.0 pg   MCHC 33.4 30.0 - 36.0 g/dL   RDW 20.3 55.9 - 74.1 %   Platelets 267 150 - 400 K/uL   nRBC 0.0 0.0 - 0.2 %    Comment: Performed at [facility], [address], [place], [place] [address]  Troponin I (High Sensitivity)     Status: None   Collection Time: 11/01/21  8:13 AM  Result Value Ref Range   Troponin I (High Sensitivity) 9 <18 ng/L    Comment: (NOTE) Elevated high sensitivity troponin I (hsTnI) values and significant  changes across serial measurements may suggest ACS but many other  chronic and acute conditions are known to elevate hsTnI results.  Refer to the "Links" section for chest pain algorithms and additional  guidance. Performed at [facility], [address] [place], [place] [address]   Urine Drug Screen, Qualitative     Status: Abnormal   Collection Time: 11/01/21  8:52 AM  Result Value Ref Range   Tricyclic, Ur Screen NONE DETECTED NONE DETECTED   Amphetamines, Ur Screen POSITIVE (A) NONE DETECTED   MDMA (Ecstasy)Ur Screen NONE DETECTED NONE DETECTED   Cocaine Metabolite,Ur [place] NONE DETECTED NONE DETECTED   Opiate, Ur Screen NONE DETECTED NONE DETECTED   Phencyclidine (PCP) Ur S NONE DETECTED NONE DETECTED   Cannabinoid 50 Ng, Ur [place] POSITIVE (A) NONE DETECTED   Barbiturates, Ur Screen NONE DETECTED NONE DETECTED   Benzodiazepine, Ur Scrn NONE DETECTED NONE DETECTED   Methadone Scn, Ur NONE DETECTED NONE DETECTED    Comment:  (NOTE) Tricyclics + metabolites, urine    Cutoff 1000 ng/mL Amphetamines + metabolites, urine  Cutoff 1000 ng/mL MDMA (Ecstasy), urine              Cutoff 500 ng/mL Cocaine Metabolite, urine          Cutoff 300 ng/mL Opiate + metabolites, urine        Cutoff 300 ng/mL Phencyclidine (PCP), urine         Cutoff 25 ng/mL Cannabinoid, urine                 Cutoff 50 ng/mL Barbiturates + metabolites, urine  Cutoff 200 ng/mL Benzodiazepine, urine              Cutoff 200 ng/mL Methadone, urine                   Cutoff 300 ng/mL  The urine drug screen provides only a preliminary, unconfirmed analytical test result and should not be used for non-medical purposes. Clinical consideration and professional judgment should be applied to any positive drug screen result due to possible interfering substances. A more specific alternate chemical method must be used in order to obtain a confirmed analytical result. Gas chromatography / mass spectrometry (GC/MS) is the preferred confirm atory method. Performed at [facility], [address] [place], [place] [address]   Resp Panel by RT-PCR (Flu A&B, Covid) Nasopharyngeal Swab     Status: None   Collection Time: 11/01/21  2:24 PM   Specimen: Nasopharyngeal Swab; Nasopharyngeal(NP) swabs in vial transport medium  Result Value Ref Range   SARS Coronavirus 2 by RT PCR NEGATIVE NEGATIVE    Comment: (NOTE) SARS-CoV-2 target nucleic acids are NOT DETECTED.  The SARS-CoV-2 RNA is generally detectable in upper respiratory specimens during the acute phase of infection. The lowest concentration of SARS-CoV-2 viral copies this assay  can detect is 138 copies/mL. A negative result does not preclude SARS-Cov-2 infection and should not be used as the sole basis for treatment or other patient management decisions. A negative result may occur with  improper specimen collection/handling, submission of specimen other than nasopharyngeal swab, presence of  viral mutation(s) within the areas targeted by this assay, and inadequate number of viral copies(<138 copies/mL). A negative result must be combined with clinical observations, patient history, and epidemiological information. The expected result is Negative.  Fact Sheet for Patients:  EntrepreneurPulse.com.au  Fact Sheet for Healthcare Providers:  IncredibleEmployment.be  This test is no t yet approved or cleared by the [place] FDA and  has been authorized for detection and/or diagnosis of SARS-CoV-2 by FDA under an Emergency Use Authorization (EUA). This EUA will remain  in effect (meaning this test can be used) for the duration of the COVID-19 declaration under Section 564(b)(1) of the Act, 21 U.S.C.section 360bbb-3(b)(1), unless the authorization is terminated  or revoked sooner.       Influenza A by PCR NEGATIVE NEGATIVE   Influenza B by PCR NEGATIVE NEGATIVE    Comment: (NOTE) The Xpert Xpress SARS-CoV-2/FLU/RSV plus assay is intended as an aid in the diagnosis of influenza from Nasopharyngeal swab specimens and should not be used as a sole basis for treatment. Nasal washings and aspirates are unacceptable for Xpert Xpress SARS-CoV-2/FLU/RSV testing.  Fact Sheet for Patients: EntrepreneurPulse.com.au  Fact Sheet for Healthcare Providers: IncredibleEmployment.be  This test is not yet approved or cleared by the [place] FDA and has been authorized for detection and/or diagnosis of SARS-CoV-2 by FDA under an Emergency Use Authorization (EUA). This EUA will remain in effect (meaning this test can be used) for the duration of the COVID-19 declaration under Section 564(b)(1) of the Act, 21 U.S.C. section 360bbb-3(b)(1), unless the authorization is terminated or revoked.  Performed at [facility], [address] [place], [place] [address]     Current Facility-Administered  Medications  Medication Dose Route Frequency Provider Last Rate Last Admin   divalproex (DEPAKOTE) DR tablet 500 mg  500 mg Oral Q12H Patrecia Pour, NP   500 mg at 11/02/21 1245   gabapentin (NEURONTIN) capsule 300 mg  300 mg Oral TID Patrecia Pour, NP       midazolam (VERSED) injection 4 mg  4 mg Intramuscular Once Merlyn Lot, MD       Current Outpatient Medications  Medication Sig Dispense Refill   ARIPiprazole (ABILIFY) 10 MG tablet Take 1 tablet (10 mg total) by mouth daily. (Patient not taking: Reported on 11/01/2021) 7 tablet 0   divalproex (DEPAKOTE) 500 MG DR tablet Take 1 tablet (500 mg total) by mouth every 12 (twelve) hours. (Patient not taking: Reported on 11/01/2021) 60 tablet 1   mirtazapine (REMERON) 7.5 MG tablet Take 1 tablet (7.5 mg total) by mouth at bedtime as needed (insomnia). (Patient not taking: Reported on 11/01/2021) 30 tablet 1   omeprazole (PRILOSEC) 40 MG capsule Take 40 mg by mouth daily.      Musculoskeletal: Strength & Muscle Tone: within normal limits Gait & Station: normal Patient leans: N/A    Psychiatric Specialty Exam: MENTAL STATUS EXAM: Appearance:  AAM, appearing stated age, appears well-nourished;  wearing appropriate hospital clothes, with fair grooming and hygiene. Normal level of alertness and appropriate facial expression.  Attitude/Behavior: calm, cooperative, engaging with appropriate eye contact.  Motor: WNL; dyskinesias not evident. Gait appears in full range.  Speech: spontaneous, clear, coherent, normal  comprehension.  Mood: Anxious  Affect: WDL  Thought process: Clear, coherent, logical  Thought content: patient denies suicidal thoughts, denies homicidal thoughts; no paranoia  Thought perception: WDL  Cognition: patient is alert and oriented in self, place, date.  Insight: fair  Judgement: fair  Physical Exam: Physical Exam Vitals and nursing note reviewed.  Constitutional:      Appearance: Normal  appearance.  HENT:     Head: Normocephalic.     Nose: Nose normal.  Pulmonary:     Effort: Pulmonary effort is normal.  Musculoskeletal:        General: Normal range of motion.     Cervical back: Normal range of motion.  Neurological:     General: No focal deficit present.     Mental Status: He is alert.  Psychiatric:        Attention and Perception: Attention and perception normal.        Mood and Affect: Mood is anxious.        Speech: Speech normal.        Behavior: Behavior normal. Behavior is cooperative.        Thought Content: Thought content normal.        Cognition and Memory: Cognition and memory normal.        Judgment: Judgment normal.   Review of Systems  Psychiatric/Behavioral:  Positive for substance abuse. The patient is nervous/anxious.   All other systems reviewed and are negative. Blood pressure 125/80, pulse 61, temperature 98.8 F (37.1 C), temperature source Oral, resp. rate 17, height 5\' 11"  (1.803 m), weight 114.8 kg, SpO2 100 %. Body mass index is 35.3 kg/m.  Treatment Plan Summary: Daily contact with patient to assess and evaluate symptoms and progress in treatment and Medication management  ASSESSMENT: Methamphetamine induced psychosis: -Started gabapentin 300 mg TID  Mood disorder: -Started Depakote 500 mg BID, responded well in the past to this   Disposition:  Started medication, re-assess in the am for stability.   Waylan Boga, NP 11/02/2021 1:59 PM

## 2021-11-02 NOTE — ED Notes (Signed)
Pt sent grilled cheese for dinner even though has an allergy-dietary made aware and is sending alt sandwich

## 2021-11-02 NOTE — ED Notes (Signed)
VS not taken, patient asleep 

## 2021-11-02 NOTE — ED Provider Notes (Signed)
Emergency Medicine Observation Re-evaluation Note  Jeremy Frank is a 35 y.o. male, seen on rounds today.  Pt initially presented to the ED for complaints of Psychiatric Evaluation   Physical Exam  BP 125/80 (BP Location: Left Arm)   Pulse 61   Temp 98.8 F (37.1 C) (Oral)   Resp 17   Ht 5\' 11"  (1.803 m)   Wt 114.8 kg   SpO2 100%   BMI 35.30 kg/m  Physical Exam General: no acute distress Psych: calm  ED Course / MDM  EKG:  I have reviewed the labs performed to date as well as medications administered while in observation.  Recent changes in the last 24 hours include none.  Plan  Current plan is for inpatient. Jeremy Frank is under involuntary commitment.      Jeremy Hansen, MD 11/02/21 [id number]

## 2021-11-02 NOTE — ED Notes (Signed)
Psych NP at bedside with officer

## 2021-11-02 NOTE — Consult Note (Signed)
Per security guard, Jeremy Frank, the client yelled a number last night and they called it.  It was his mother (number on file is not correct for her) and she reported that he was using drugs and stopped taking his medications.  She confirmed that he does drugs, he admits to using meth, and this why he ends up in the ED.  He is complaint with his meds and will continue to monitor if he continues to remain stable.  Nanine Means, PMHNP

## 2021-11-02 NOTE — ED Notes (Addendum)
Pt laying on bed. Every thing staff says he just states that nothing is helping and he wants to go home and drink. He said he just got paid and has hotel for the week and plenty of alcohol. Pt states that the Abilify does not work for him. Pt states that the shot worked for him last night and that he just wants to go home. Pt acts like what we say is a lie and kinda rolls eyes and smirks at the staff after talking. Pt expresses concerns for himself in feeling unsafe with hisself and knows he needs help but feels like he isnt getting it anywhere.   Pt received breakfast tray and drink.

## 2021-11-02 NOTE — ED Notes (Signed)
Pt informed they wanted to restart his abilify. Pt states "I just want to leave" over and over and will not give this RN a straight answer to whether or not he wants to take his medication.

## 2021-11-03 DIAGNOSIS — F15951 Other stimulant use, unspecified with stimulant-induced psychotic disorder with hallucinations: Secondary | ICD-10-CM | POA: Diagnosis not present

## 2021-11-03 MED ORDER — GABAPENTIN 100 MG PO CAPS: 100.0000 mg | ORAL_CAPSULE | Freq: Three times a day (TID) | ORAL | Status: DC

## 2021-11-03 MED ORDER — DIVALPROEX SODIUM 500 MG PO DR TAB: 500.0000 mg | DELAYED_RELEASE_TABLET | Freq: Two times a day (BID) | ORAL | 1 refills | Status: DC

## 2021-11-03 MED ORDER — GABAPENTIN 100 MG PO CAPS: 100.0000 mg | ORAL_CAPSULE | Freq: Three times a day (TID) | ORAL | 1 refills | Status: DC

## 2021-11-03 NOTE — ED Notes (Signed)
IV[facility] pending reassessment 

## 2021-11-03 NOTE — ED Provider Notes (Signed)
Patient seen by psychiatry service today on rounds and cleared for discharge.  Discharged in stable condition   Gilles Chiquito, MD 11/03/21 1105

## 2021-11-03 NOTE — ED Notes (Signed)
Breakfast tray given. °

## 2021-11-03 NOTE — BH Assessment (Signed)
Writer spoke with the patient to complete an updated/reassessment. Patient denies SI/HI and AV/H.  Writer provided the patient with contact information with [facility] and [facility]. Patient states he has an appointment with them, which is required for condition of his probation.

## 2021-11-03 NOTE — ED Notes (Signed)
Patient states he is calling his sister for a ride.

## 2021-11-03 NOTE — ED Notes (Signed)
Pt. Got breakfast tray and this tech was able to collect AM vitals, also pt. Wanted to know if the NP was going to be here today, to talk to the pt. Notified RN about pt. Request.

## 2021-11-03 NOTE — Consult Note (Addendum)
[facility] Consult   Reason for Consult:  psychosis Referring Physician:  Dr. Quentin Cornwall Patient Identification: Jeremy Frank MRN:  [id number] Principal Diagnosis: Methamphetamine induced psychosis Diagnosis:  Methamphetamine induced psychosis  Total Time spent with patient: 30 minutes  Subjective:   Jeremy Frank is a 35 y.o. male patient with psych history of PTSD, depression, substance use disorder, who was admitted to ER with with hallucinations and paranoia.  Client is calm, cooperative, and coherent.  Denies any withdrawal symptoms or side effects from his medications.  He has a court Insurance underwriter with [facility] this month, agreeable to reach out to their liaison, Lanae Boast.  Information provided to contact him and also to get his medications at a reduced cost via [facility].  The client acknowledges he has issues with meth and other substances that is preventing him from seeing his 66 yo daughter which bothers him significantly.  Discussed strategies and focus to things he can control at this time.  He also support from the [place] as he is a English as a second language teacher.  NO suicidal/homicidal ideations, hallucinations, or withdrawal symptoms.  Psychiatrically stable for discharge.  11/02/21: Client is calm, clear and coherent today with no suicidal/homicidal ideations, hallucinations, or withdrawal symptoms.  Medications started and will monitor to see if he maintains his stability for discharge.    HPI per Dr Danella Sensing on admission:   Patient reports he is experiencing auditory and visual hallucination - voices commanding him to hurt himself and seeing militant people. He disclosed not taking psychotropic medications for about a month. He also admitted to substance use - amphetamines and THC. He reports hallucinations make him feel depressed and anxious. Reports also feeling paranoid that he is in danger. He denies feeling suicidal or homicidal, although reports feeling not feeling safe from impulsively  hurting self due to commanding voices. 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 He feel not safe outside the hospital and is willing to be admitted for safety and stabilization on medications. He denies any current physical complaints. He agreed to restart Abilify at a dose 10mg  daily.  Past Psychiatric History:  Dx: MDD, anxiety, PTSD, cocaine use d/o, stimulant use disorder. Reports h/o past suicidal attempts and psych admissions. Past med trials: Abilify, Depakote, Prazosin, Haldol, Risperidone.  Risk to Self:  none Risk to Others:  none Prior Inpatient Therapy:  several Prior Outpatient Therapy:  ACT team in the past  Past Medical History:  Past Medical History:  Diagnosis Date   Drug dependence ([place])    GI bleed    Hiatal hernia    Hypertension    PTSD (post-traumatic stress disorder)     Past Surgical History:  Procedure Laterality Date   KNEE SURGERY     Family History: History reviewed. No pertinent family history. Family Psychiatric  History: unknown Social History:  Social History   Substance and Sexual Activity  Alcohol Use Yes   Alcohol/week: 42.0 standard drinks   Types: 42 Cans of beer per week   Comment: per pt he drinks 6-12 cans of beer daily     Social History   Substance and Sexual Activity  Drug Use Yes   Types: Cocaine, Marijuana    Social History   Socioeconomic History   Marital status: Single    Spouse name: Not on file   Number of children: Not  on file   Years of education: Not on file   Highest education level: Not on file  Occupational History   Not on file  Tobacco Use   Smoking status: Every Day    Packs/day: 0.25    Types: Cigarettes   Smokeless tobacco: Never  Vaping Use   Vaping Use: Never used  Substance and Sexual Activity   Alcohol use: Yes    Alcohol/week: 42.0 standard drinks     Types: 42 Cans of beer per week    Comment: per pt he drinks 6-12 cans of beer daily   Drug use: Yes    Types: Cocaine, Marijuana   Sexual activity: Not on file  Other Topics Concern   Not on file  Social History Narrative   Not on file   Social Determinants of Health   Financial Resource Strain: Not on file  Food Insecurity: Not on file  Transportation Needs: Not on file  Physical Activity: Not on file  Stress: Not on file  Social Connections: Not on file   Additional Social History:    Allergies:   Allergies  Allergen Reactions   Cheese Diarrhea   Eggs Or Egg-Derived Products Diarrhea   Peanut-Containing Drug Products Diarrhea    Labs:  Results for orders placed or performed during the hospital encounter of 11/01/21 (from the past 48 hour(s))  Resp Panel by RT-PCR (Flu A&B, Covid) Nasopharyngeal Swab     Status: None   Collection Time: 11/01/21  2:24 PM   Specimen: Nasopharyngeal Swab; Nasopharyngeal(NP) swabs in vial transport medium  Result Value Ref Range   SARS Coronavirus 2 by RT PCR NEGATIVE NEGATIVE    Comment: (NOTE) SARS-CoV-2 target nucleic acids are NOT DETECTED.  The SARS-CoV-2 RNA is generally detectable in upper respiratory specimens during the acute phase of infection. The lowest concentration of SARS-CoV-2 viral copies this assay can detect is 138 copies/mL. A negative result does not preclude SARS-Cov-2 infection and should not be used as the sole basis for treatment or other patient management decisions. A negative result may occur with  improper specimen collection/handling, submission of specimen other than nasopharyngeal swab, presence of viral mutation(s) within the areas targeted by this assay, and inadequate number of viral copies(<138 copies/mL). A negative result must be combined with clinical observations, patient history, and epidemiological information. The expected result is Negative.  Fact Sheet for Patients:   BloggerCourse.com  Fact Sheet for Healthcare Providers:  SeriousBroker.it  This test is no t yet approved or cleared by the [place] FDA and  has been authorized for detection and/or diagnosis of SARS-CoV-2 by FDA under an Emergency Use Authorization (EUA). This EUA will remain  in effect (meaning this test can be used) for the duration of the COVID-19 declaration under Section 564(b)(1) of the Act, 21 U.S.C.section 360bbb-3(b)(1), unless the authorization is terminated  or revoked sooner.       Influenza A by PCR NEGATIVE NEGATIVE   Influenza B by PCR NEGATIVE NEGATIVE    Comment: (NOTE) The Xpert Xpress SARS-CoV-2/FLU/RSV plus assay is intended as an aid in the diagnosis of influenza from Nasopharyngeal swab specimens and should not be used as a sole basis for treatment. Nasal washings and aspirates are unacceptable for Xpert Xpress SARS-CoV-2/FLU/RSV testing.  Fact Sheet for Patients: BloggerCourse.com  Fact Sheet for Healthcare Providers: SeriousBroker.it  This test is not yet approved or cleared by the [place] FDA and has been authorized for detection and/or diagnosis of SARS-CoV-2 by FDA under an  Emergency Use Authorization (EUA). This EUA will remain in effect (meaning this test can be used) for the duration of the COVID-19 declaration under Section 564(b)(1) of the Act, 21 U.S.C. section 360bbb-3(b)(1), unless the authorization is terminated or revoked.  Performed at [facility], [address] [place], [place] [address]     Current Facility-Administered Medications  Medication Dose Route Frequency Provider Last Rate Last Admin   divalproex (DEPAKOTE) DR tablet 500 mg  500 mg Oral Q12H Charm Rings, NP   500 mg at 11/03/21 0913   gabapentin (NEURONTIN) capsule 300 mg  300 mg Oral TID Charm Rings, NP   300 mg at 11/03/21 6237    Current Outpatient Medications  Medication Sig Dispense Refill   ARIPiprazole (ABILIFY) 10 MG tablet Take 1 tablet (10 mg total) by mouth daily. (Patient not taking: Reported on 11/01/2021) 7 tablet 0   divalproex (DEPAKOTE) 500 MG DR tablet Take 1 tablet (500 mg total) by mouth every 12 (twelve) hours. (Patient not taking: Reported on 11/01/2021) 60 tablet 1   mirtazapine (REMERON) 7.5 MG tablet Take 1 tablet (7.5 mg total) by mouth at bedtime as needed (insomnia). (Patient not taking: Reported on 11/01/2021) 30 tablet 1   omeprazole (PRILOSEC) 40 MG capsule Take 40 mg by mouth daily.      Musculoskeletal: Strength & Muscle Tone: within normal limits Gait & Station: normal Patient leans: N/A    Psychiatric Specialty Exam: MENTAL STATUS EXAM: Appearance:  AAM, appearing stated age, appears well-nourished;  wearing appropriate hospital clothes, with fair grooming and hygiene. Normal level of alertness and appropriate facial expression.  Attitude/Behavior: calm, cooperative, engaging with appropriate eye contact.  Motor: WNL; dyskinesias not evident. Gait appears in full range.  Speech: spontaneous, clear, coherent, normal comprehension.  Mood: Euthymic  Affect: WDL  Thought process: Clear, coherent, logical  Thought content: patient denies suicidal thoughts, denies homicidal thoughts; no paranoia  Thought perception: WDL  Cognition: patient is alert and oriented in self, place, date.  Insight: good  Judgement: fair  Physical Exam: Physical Exam Vitals and nursing note reviewed.  Constitutional:      Appearance: Normal appearance.  HENT:     Head: Normocephalic.     Nose: Nose normal.  Pulmonary:     Effort: Pulmonary effort is normal.  Musculoskeletal:        General: Normal range of motion.     Cervical back: Normal range of motion.  Neurological:     General: No focal deficit present.     Mental Status: He is alert.  Psychiatric:        Attention and  Perception: Attention and perception normal.        Mood and Affect: Mood is anxious.        Speech: Speech normal.        Behavior: Behavior normal. Behavior is cooperative.        Thought Content: Thought content normal.        Cognition and Memory: Cognition and memory normal.        Judgment: Judgment normal.   Review of Systems  Psychiatric/Behavioral:  Positive for substance abuse. The patient is nervous/anxious.   All other systems reviewed and are negative. Blood pressure 136/87, pulse 67, temperature 98.8 F (37.1 C), temperature source Oral, resp. rate 20, height 5\' 11"  (1.803 m), weight 114.8 kg, SpO2 100 %. Body mass index is 35.3 kg/m.  Treatment Plan Summary: Daily contact with patient to assess and evaluate  symptoms and progress in treatment and Medication management  ASSESSMENT: Methamphetamine induced psychosis: -Decreased gabapentin 300 mg TID to 100 mg TID -Follow up with [facility] and Lanae Boast, the liaison   Mood disorder: -Continued Depakote 500 mg BID, responded well in the past to this   Disposition:  Discharge home   Waylan Boga, NP 11/03/2021 10:51 AM

## 2021-11-08 ENCOUNTER — Encounter: Payer: Self-pay | Admitting: Emergency Medicine

## 2021-11-08 ENCOUNTER — Emergency Department
Admission: EM | Admit: 2021-11-08 | Discharge: 2021-11-09 | Disposition: A | Discharge status: Other Acute Inpatient Hospital | Payer: No Typology Code available for payment source | Attending: Emergency Medicine | Admitting: Emergency Medicine

## 2021-11-08 ENCOUNTER — Other Ambulatory Visit: Payer: Self-pay

## 2021-11-08 DIAGNOSIS — F333 Major depressive disorder, recurrent, severe with psychotic symptoms: Secondary | ICD-10-CM | POA: Diagnosis present

## 2021-11-08 DIAGNOSIS — F15951 Other stimulant use, unspecified with stimulant-induced psychotic disorder with hallucinations: Secondary | ICD-10-CM | POA: Diagnosis present

## 2021-11-08 DIAGNOSIS — Z79899 Other long term (current) drug therapy: Secondary | ICD-10-CM | POA: Diagnosis not present

## 2021-11-08 DIAGNOSIS — F17210 Nicotine dependence, cigarettes, uncomplicated: Secondary | ICD-10-CM | POA: Diagnosis not present

## 2021-11-08 DIAGNOSIS — F15959 Other stimulant use, unspecified with stimulant-induced psychotic disorder, unspecified: Secondary | ICD-10-CM | POA: Diagnosis present

## 2021-11-08 DIAGNOSIS — R4689 Other symptoms and signs involving appearance and behavior: Secondary | ICD-10-CM

## 2021-11-08 DIAGNOSIS — R456 Violent behavior: Secondary | ICD-10-CM | POA: Insufficient documentation

## 2021-11-08 DIAGNOSIS — Y900 Blood alcohol level of less than 20 mg/100 ml: Secondary | ICD-10-CM | POA: Insufficient documentation

## 2021-11-08 DIAGNOSIS — Z91010 Allergy to peanuts: Secondary | ICD-10-CM | POA: Insufficient documentation

## 2021-11-08 DIAGNOSIS — I10 Essential (primary) hypertension: Secondary | ICD-10-CM | POA: Insufficient documentation

## 2021-11-08 DIAGNOSIS — Z20822 Contact with and (suspected) exposure to covid-19: Secondary | ICD-10-CM | POA: Diagnosis not present

## 2021-11-08 LAB — COMPREHENSIVE METABOLIC PANEL
ALT: 19 U/L (ref 0–44)
AST: 37 U/L (ref 15–41)
Albumin: 4.9 g/dL (ref 3.5–5.0)
Alkaline Phosphatase: 66 U/L (ref 38–126)
Anion gap: 16 — ABNORMAL HIGH (ref 5–15)
BUN: 17 mg/dL (ref 6–20)
CO2: 22 mmol/L (ref 22–32)
Calcium: 9.7 mg/dL (ref 8.9–10.3)
Chloride: 104 mmol/L (ref 98–111)
Creatinine, Ser: 1.59 mg/dL — ABNORMAL HIGH (ref 0.61–1.24)
GFR, Estimated: 57 mL/min — ABNORMAL LOW (ref >60)
Glucose, Bld: 90 mg/dL (ref 70–99)
Potassium: 3.4 mmol/L — ABNORMAL LOW (ref 3.5–5.1)
Sodium: 142 mmol/L (ref 135–145)
Total Bilirubin: 0.7 mg/dL (ref 0.3–1.2)
Total Protein: 8.7 g/dL — ABNORMAL HIGH (ref 6.5–8.1)

## 2021-11-08 LAB — CBC
HCT: 43.7 % (ref 39.0–52.0)
Hemoglobin: 14.4 g/dL (ref 13.0–17.0)
MCH: 30.1 pg (ref 26.0–34.0)
MCHC: 33 g/dL (ref 30.0–36.0)
MCV: 91.4 fL (ref 80.0–100.0)
Platelets: 194 10*3/uL (ref 150–400)
RBC: 4.78 MIL/uL (ref 4.22–5.81)
RDW: 12.8 % (ref 11.5–15.5)
WBC: 10.9 10*3/uL — ABNORMAL HIGH (ref 4.0–10.5)
nRBC: 0 % (ref 0.0–0.2)

## 2021-11-08 LAB — ETHANOL: Alcohol, Ethyl (B): <10 mg/dL (ref <10)

## 2021-11-08 LAB — ACETAMINOPHEN LEVEL: Acetaminophen (Tylenol), Serum: <10 ug/mL — ABNORMAL LOW (ref 10–30)

## 2021-11-08 LAB — SALICYLATE LEVEL: Salicylate Lvl: <7 mg/dL — ABNORMAL LOW (ref 7.0–30.0)

## 2021-11-08 MED ORDER — DIPHENHYDRAMINE HCL 50 MG/ML IJ SOLN
50.0000 mg | Freq: Once | INTRAMUSCULAR | Status: AC
  Administered 2021-11-08: 50 mg via INTRAMUSCULAR

## 2021-11-08 MED ORDER — HALOPERIDOL LACTATE 5 MG/ML IJ SOLN
INTRAMUSCULAR | Status: AC
  Filled 2021-11-08: qty 1

## 2021-11-08 MED ORDER — LORAZEPAM 2 MG/ML IJ SOLN
INTRAMUSCULAR | Status: AC
  Filled 2021-11-08: qty 1

## 2021-11-08 MED ORDER — LORAZEPAM 2 MG/ML IJ SOLN
2.0000 mg | Freq: Once | INTRAMUSCULAR | Status: AC
  Administered 2021-11-08: 2 mg via INTRAMUSCULAR

## 2021-11-08 MED ORDER — HALOPERIDOL LACTATE 5 MG/ML IJ SOLN
5.0000 mg | Freq: Once | INTRAMUSCULAR | Status: AC
  Administered 2021-11-08: 5 mg via INTRAMUSCULAR

## 2021-11-08 MED ORDER — DIPHENHYDRAMINE HCL 50 MG/ML IJ SOLN
INTRAMUSCULAR | Status: AC
  Filled 2021-11-08: qty 1

## 2021-11-08 NOTE — BH Assessment (Signed)
Patient currently unable to be assessed at this time due to medications, will attempt at a later time

## 2021-11-08 NOTE — ED Triage Notes (Signed)
Pt to ED via [employer] for psychiatric eval. Pt noted to be extremely combative and agitated on arrival to ED with [employer]. Pt noted to be paranoid on arrival, repeatedly stating that staff was going to kill him and that out people were going to kill him.   Per [employer] called out by bystanders due to patient being homeless and patient was arguing with himself and fighting with himself. Per [employer] pt was pacing up and down the road with paranoid delusions stating someone was going to kill him, and making homicidal statements.

## 2021-11-08 NOTE — ED Notes (Signed)
Report to include Situation, Background, Assessment, and Recommendations received from [facility]. Patient alert and oriented, warm and dry, in no acute distress. UTA SI, HI, AVH and pain. Patient made aware of Q15 minute rounds and Psychologist, counselling presence for their safety. Patient instructed to come to me with needs or concerns.

## 2021-11-08 NOTE — ED Notes (Signed)
At this time pt not appropriate to move to [facility].

## 2021-11-08 NOTE — ED Notes (Signed)
Pt visualized resting in bed with eyes closed. Q43min safety obs in place, security rounding at this time. NAD noted at this time.

## 2021-11-08 NOTE — ED Provider Notes (Signed)
[facility] Emergency Department Provider Note  ____________________________________________   I have reviewed the triage vital signs and the nursing notes.   HISTORY  Chief Complaint Psychiatric Evaluation   History limited by and level 5 caveat due to: Not cooperative   HPI Jeremy Frank is a 35 y.o. male who presents to the emergency department today because of concerns for abnormal behavior and homicidal ideation.  The patient himself is not able to give any history so history was obtained by police who brought patient in.  Apparently patient called 911 today because he was worried 70 broken into his house.  When police got there there was no evidence of any other person or intruder.  Patient did become agitated when p.o. police were there and started talk about shooting up and killing people.  At that time patient was placed under IVC.  Records reviewed. Per medical record review patient has a history of psychiatric illness. Substance abuse.   Past Medical History:  Diagnosis Date   Drug dependence ([place])    GI bleed    Hiatal hernia    Hypertension    PTSD (post-traumatic stress disorder)     Patient Active Problem List   Diagnosis Date Noted   Cannabis dependence, continuous ([place]) 06/18/2021   Alcohol abuse with alcohol-induced mood disorder ([place]) 06/18/2021   Crohn's disease ([place]) 06/18/2021   Nicotine dependence 06/18/2021   Other personal history presenting hazards to health 06/18/2021   Peptic ulcer 06/18/2021   Severe alcohol dependence ([place]) 06/18/2021   Major depressive disorder, recurrent episode, severe, with psychosis ([place]) 06/16/2021   Amphetamine and psychostimulant-induced psychosis with hallucinations ([place]) 10/19/2020   Hypertension 10/19/2020   Psychosis ([place]) 06/13/2015   Posttraumatic stress disorder 06/13/2015   Traumatic brain injury ([place]) 06/13/2015   Cocaine dependence ([place]) 06/13/2015   Marijuana abuse 06/13/2015     Past Surgical History:  Procedure Laterality Date   KNEE SURGERY      Prior to Admission medications   Medication Sig Start Date End Date Taking? Authorizing Provider  divalproex (DEPAKOTE) 500 MG DR tablet Take 1 tablet (500 mg total) by mouth every 12 (twelve) hours. 11/03/21   Patrecia Pour, NP  gabapentin (NEURONTIN) 100 MG capsule Take 1 capsule (100 mg total) by mouth 3 (three) times daily. 11/03/21   Patrecia Pour, NP  omeprazole (PRILOSEC) 40 MG capsule Take 40 mg by mouth daily. 04/18/20   [provider]    Allergies Cheese, Eggs or egg-derived products, and Peanut-containing drug products  No family history on file.  Social History Social History   Tobacco Use   Smoking status: Every Day    Packs/day: 0.25    Types: Cigarettes   Smokeless tobacco: Never  Vaping Use   Vaping Use: Never used  Substance Use Topics   Alcohol use: Yes    Alcohol/week: 42.0 standard drinks    Types: 42 Cans of beer per week    Comment: per pt he drinks 6-12 cans of beer daily   Drug use: Yes    Types: Cocaine, Marijuana    Review of Systems Unable to obtain  ____________________________________________   PHYSICAL EXAM:  VITAL SIGNS: ED Triage Vitals  Enc Vitals Group     BP 11/08/21 2144 (!) 129/99     Pulse Rate 11/08/21 2144 (!) 110     Resp 11/08/21 2144 20     Temp 11/08/21 2144 98.1 F (36.7 C)     Temp Source 11/08/21  2144 Oral     SpO2 11/08/21 2144 96 %     Weight 11/08/21 2148 253 lb 1.4 oz (114.8 kg)     Height 11/08/21 2148 5\' 11"  (1.803 m)     Head Circumference --      Peak Flow --      Pain Score 11/08/21 2148 0   Constitutional: Awake, agitated. Eyes: Conjunctivae are normal.  ENT      Head: Normocephalic and atraumatic.      Nose: No congestion/rhinnorhea.      Mouth/Throat: Mucous membranes are moist.      Neck: No stridor. Hematological/Lymphatic/Immunilogical: No cervical lymphadenopathy. Cardiovascular: Normal rate, regular  rhythm.  No murmurs, rubs, or gallops.  Respiratory: Normal respiratory effort without tachypnea nor retractions. Breath sounds are clear and equal bilaterally. No wheezes/rales/rhonchi. Gastrointestinal: Soft and non tender. No rebound. No guarding.  Genitourinary: Deferred Musculoskeletal: Normal range of motion in all extremities. No lower extremity edema. Neurologic:  Normal speech and language. No gross focal neurologic deficits are appreciated.  Skin:  Skin is warm, dry and intact. No rash noted. Psychiatric: Agitated, yelling  ____________________________________________    LABS (pertinent positives/negatives)  Salicylate, acetaminophen, ethanol below threshold CBC wbc 10.9, hgb 14.4, plt 194 CMP na 142, k 3.4, cr 1.59  ____________________________________________   EKG  None  ____________________________________________    RADIOLOGY  None  ____________________________________________   PROCEDURES  Procedures  ____________________________________________   INITIAL IMPRESSION / ASSESSMENT AND PLAN / ED COURSE  Pertinent labs & imaging results that were available during my care of the patient were reviewed by me and considered in my medical decision making (see chart for details).   Patient presented to the emergency department today under IVC because of concerns for abnormal and agitated behavior as well as homicidal statements.  On my exam patient is quite agitated.  He is yelling.  Given concern for safety of staff and patient himself the decision was made to give him some medicine to try to help calm him down.  After the medicine he was able to become quite calm and sleep.  Per chart review does have a history of both psychiatric illness as well as polysubstance abuse.  The patient has been placed in psychiatric observation due to the need to provide a safe environment for the patient while obtaining psychiatric consultation and evaluation, as well as ongoing  medical and medication management to treat the patient's condition.  The patient has been placed under full IVC at this time.   ____________________________________________   FINAL CLINICAL IMPRESSION(S) / ED DIAGNOSES  Final diagnoses:  Aggressive behavior     Note: This dictation was prepared with Dragon dictation. Any transcriptional errors that result from this process are unintentional     2149, MD 11/08/21 2325

## 2021-11-09 ENCOUNTER — Encounter: Payer: Self-pay | Admitting: Psychiatry

## 2021-11-09 ENCOUNTER — Inpatient Hospital Stay
Admission: RE | Admit: 2021-11-09 | Discharge: 2021-11-11 | DRG: 885 | Disposition: A | Discharge status: Home / Self Care | Payer: No Typology Code available for payment source | Source: Intra-hospital | Attending: Psychiatry | Admitting: Psychiatry

## 2021-11-09 ENCOUNTER — Other Ambulatory Visit: Payer: Self-pay

## 2021-11-09 DIAGNOSIS — F4310 Post-traumatic stress disorder, unspecified: Secondary | ICD-10-CM | POA: Diagnosis present

## 2021-11-09 DIAGNOSIS — K219 Gastro-esophageal reflux disease without esophagitis: Secondary | ICD-10-CM | POA: Diagnosis present

## 2021-11-09 DIAGNOSIS — Z91010 Allergy to peanuts: Secondary | ICD-10-CM | POA: Diagnosis not present

## 2021-11-09 DIAGNOSIS — F15151 Other stimulant abuse with stimulant-induced psychotic disorder with hallucinations: Secondary | ICD-10-CM | POA: Diagnosis present

## 2021-11-09 DIAGNOSIS — F1420 Cocaine dependence, uncomplicated: Secondary | ICD-10-CM | POA: Diagnosis present

## 2021-11-09 DIAGNOSIS — Z87820 Personal history of traumatic brain injury: Secondary | ICD-10-CM

## 2021-11-09 DIAGNOSIS — F333 Major depressive disorder, recurrent, severe with psychotic symptoms: Secondary | ICD-10-CM

## 2021-11-09 DIAGNOSIS — I10 Essential (primary) hypertension: Secondary | ICD-10-CM | POA: Diagnosis present

## 2021-11-09 DIAGNOSIS — F15959 Other stimulant use, unspecified with stimulant-induced psychotic disorder, unspecified: Secondary | ICD-10-CM | POA: Diagnosis not present

## 2021-11-09 DIAGNOSIS — F17210 Nicotine dependence, cigarettes, uncomplicated: Secondary | ICD-10-CM | POA: Diagnosis present

## 2021-11-09 DIAGNOSIS — Z91011 Allergy to milk products: Secondary | ICD-10-CM

## 2021-11-09 DIAGNOSIS — F15951 Other stimulant use, unspecified with stimulant-induced psychotic disorder with hallucinations: Secondary | ICD-10-CM

## 2021-11-09 DIAGNOSIS — Z91012 Allergy to eggs: Secondary | ICD-10-CM | POA: Diagnosis not present

## 2021-11-09 DIAGNOSIS — S069XAA Unspecified intracranial injury with loss of consciousness status unknown, initial encounter: Secondary | ICD-10-CM | POA: Diagnosis present

## 2021-11-09 DIAGNOSIS — G4700 Insomnia, unspecified: Secondary | ICD-10-CM | POA: Diagnosis present

## 2021-11-09 LAB — RESP PANEL BY RT-PCR (FLU A&B, COVID) ARPGX2
Influenza A by PCR: NEGATIVE
Influenza B by PCR: NEGATIVE
SARS Coronavirus 2 by RT PCR: NEGATIVE

## 2021-11-09 MED ORDER — ACETAMINOPHEN 325 MG PO TABS: 650.0000 mg | ORAL_TABLET | Freq: Four times a day (QID) | ORAL | Status: DC | PRN

## 2021-11-09 MED ORDER — GABAPENTIN 300 MG PO CAPS
300.0000 mg | ORAL_CAPSULE | Freq: Three times a day (TID) | ORAL | Status: DC
  Administered 2021-11-09: 300 mg via ORAL
  Filled 2021-11-09: qty 1

## 2021-11-09 MED ORDER — DIVALPROEX SODIUM 500 MG PO DR TAB
500.0000 mg | DELAYED_RELEASE_TABLET | Freq: Two times a day (BID) | ORAL | Status: DC
  Filled 2021-11-09 (×2): qty 1

## 2021-11-09 MED ORDER — GABAPENTIN 300 MG PO CAPS
300.0000 mg | ORAL_CAPSULE | Freq: Three times a day (TID) | ORAL | Status: DC
  Filled 2021-11-09 (×2): qty 1

## 2021-11-09 MED ORDER — ALUM & MAG HYDROXIDE-SIMETH 200-200-20 MG/5ML PO SUSP: 30.0000 mL | ORAL | Status: DC | PRN

## 2021-11-09 MED ORDER — HALOPERIDOL 1 MG PO TABS
2.0000 mg | ORAL_TABLET | Freq: Two times a day (BID) | ORAL | Status: DC
  Filled 2021-11-09 (×2): qty 2

## 2021-11-09 MED ORDER — MAGNESIUM HYDROXIDE 400 MG/5ML PO SUSP: 30.0000 mL | Freq: Every day | ORAL | Status: DC | PRN

## 2021-11-09 MED ORDER — PANTOPRAZOLE SODIUM 40 MG PO TBEC
80.0000 mg | DELAYED_RELEASE_TABLET | Freq: Every day | ORAL | Status: DC
  Filled 2021-11-09 (×2): qty 2

## 2021-11-09 MED ORDER — DIVALPROEX SODIUM 500 MG PO DR TAB
500.0000 mg | DELAYED_RELEASE_TABLET | Freq: Two times a day (BID) | ORAL | Status: DC
  Administered 2021-11-09: 500 mg via ORAL
  Filled 2021-11-09: qty 1

## 2021-11-09 MED ORDER — HALOPERIDOL 0.5 MG PO TABS
2.0000 mg | ORAL_TABLET | Freq: Two times a day (BID) | ORAL | Status: DC
  Administered 2021-11-09: 2 mg via ORAL
  Filled 2021-11-09 (×2): qty 1

## 2021-11-09 NOTE — ED Notes (Signed)
VS not taken, patient asleep 

## 2021-11-09 NOTE — ED Notes (Signed)
Hourly rounding reveals patient in room. No complaints, stable, in no acute distress. Q15 minute rounds and monitoring via Rover and Officer to continue.   

## 2021-11-09 NOTE — BH Assessment (Signed)
Patient currently unable to be assessed at this time due to medications, will attempt at a later time

## 2021-11-09 NOTE — Plan of Care (Signed)
  Problem: Education: Goal: Knowledge of [place] [facility] Education information/materials will improve Outcome: Not Progressing Goal: Emotional status will improve Outcome: Not Progressing Goal: Mental status will improve Outcome: Not Progressing Goal: Verbalization of understanding the information provided will improve Outcome: Not Progressing   Problem: Coping: Goal: Ability to verbalize frustrations and anger appropriately will improve Outcome: Not Progressing Goal: Ability to demonstrate self-control will improve Outcome: Not Progressing   Problem: Health Behavior/Discharge Planning: Goal: Identification of resources available to assist in meeting health care needs will improve Outcome: Not Progressing Goal: Compliance with treatment plan for underlying cause of condition will improve Outcome: Not Progressing   Problem: Physical Regulation: Goal: Ability to maintain clinical measurements within normal limits will improve Outcome: Not Progressing   Problem: Safety: Goal: Periods of time without injury will increase Outcome: Not Progressing   Problem: Activity: Goal: Will verbalize the importance of balancing activity with adequate rest periods Outcome: Not Progressing   Problem: Education: Goal: Will be free of psychotic symptoms Outcome: Not Progressing Goal: Knowledge of the prescribed therapeutic regimen will improve Outcome: Not Progressing   Problem: Coping: Goal: Coping ability will improve Outcome: Not Progressing Goal: Will verbalize feelings Outcome: Not Progressing   Problem: Health Behavior/Discharge Planning: Goal: Compliance with prescribed medication regimen will improve Outcome: Not Progressing

## 2021-11-09 NOTE — Progress Notes (Signed)
Patient refused medications due this evening. States, "I will rather take them later tonight. " RN called pharmacy to request if medication could be given with evening meds. Medication moved to QHS x1 dose

## 2021-11-09 NOTE — ED Notes (Signed)
Pt discharged to BMU.  VS stable.  Belongings sent with patient.

## 2021-11-09 NOTE — Tx Team (Signed)
Initial Treatment Plan 11/09/2021 6:33 PM Jeremy Frank [id number]    PATIENT STRESSORS: Medication change or noncompliance   Substance abuse     PATIENT STRENGTHS: Other: Patient has poor insight   PATIENT IDENTIFIED PROBLEMS: Psychosis                     DISCHARGE CRITERIA:  Ability to meet basic life and health needs Adequate post-discharge living arrangements Motivation to continue treatment in a less acute level of care  PRELIMINARY DISCHARGE PLAN: Placement in alternative living arrangements  PATIENT/FAMILY INVOLVEMENT: This treatment plan has been presented to and reviewed with the patient Jeremy Frank.  The patient and family have been given the opportunity to ask questions and make suggestions.  [address] [place] 11/09/2021, 6:33 PM

## 2021-11-09 NOTE — ED Provider Notes (Signed)
Emergency Medicine Observation Re-evaluation Note  Jeremy Frank is a 35 y.o. male, seen on rounds today.  Pt initially presented to the ED for complaints of Psychiatric Evaluation Currently, the patient is resting.  Physical Exam  BP 104/60 (BP Location: Right Arm)   Pulse 88   Temp 98.1 F (36.7 C) (Oral)   Resp 18   Ht 1.803 m (5\' 11" )   Wt 114.8 kg   SpO2 97%   BMI 35.30 kg/m  Physical Exam Gen:  No acute distress Resp:  Breathing easily and comfortably, no accessory muscle usage Neuro:  Moving all four extremities, no gross focal neuro deficits Psych:  Resting currently, was combative and uncooperative requiring calming agent administration earlier.  ED Course / MDM  EKG:   I have reviewed the labs performed to date as well as medications administered while in observation.  Recent changes in the last 24 hours include initial assessment by EDP.  Plan  Current plan is for evaluation by psychiatry when awake and alert. ROLLEN SELDERS is under involuntary commitment.      Earvin Hansen, MD 11/09/21 [phone]

## 2021-11-09 NOTE — Consult Note (Addendum)
[place] [facility] Consult   Reason for Consult:  psychosis Referring Physician:  Dr. Quentin Cornwall Patient Identification: Jeremy Frank MRN:  [id number] Principal Diagnosis: Methamphetamine induced psychosis Diagnosis:  Methamphetamine induced psychosis  Total Time spent with patient: 30 minutes  Subjective:   Jeremy Frank is a 35 y.o. male patient with psych history of PTSD, depression, substance use disorder, who was admitted to ER with with hallucinations and paranoia along with homicidal ideations.  Client continues to see "shadow people" and homicidal ideations towards the people that "stole my rent money and kicked me out" of his boarding house.  Last night, he was using meth and called the police as he thought people broke into room at the boarding house.  He was also agitated, calm and cooperative today with a little irritability, more rationale.  He continues to self-medicate his depression with substances, high depression, no suicidal ideations.  [facility].  HPI per Dr Danella Sensing last weekend on 11/4:   Patient reports he is experiencing auditory and visual hallucination - voices commanding him to hurt himself and seeing militant people. He disclosed not taking psychotropic medications for about a month. He also admitted to substance use - amphetamines and THC. He reports hallucinations make him feel depressed and anxious. Reports also feeling paranoid that he is in danger. He denies feeling suicidal or homicidal, although reports feeling not feeling safe from impulsively hurting self due to commanding voices. 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 He feel not safe outside the hospital and is willing to be admitted for safety and stabilization on medications. He denies any current physical complaints. He agreed to  restart Abilify at a dose 10mg  daily.  Past Psychiatric History:  Dx: MDD, anxiety, PTSD, cocaine use d/o, stimulant use disorder. Reports h/o past suicidal attempts and psych admissions. Past med trials: Abilify, Depakote, Prazosin, Haldol, Risperidone.  Risk to Self:  none Risk to Others:  none Prior Inpatient Therapy:  several Prior Outpatient Therapy:  ACT team in the past  Past Medical History:  Past Medical History:  Diagnosis Date   Drug dependence ([place])    GI bleed    Hiatal hernia    Hypertension    PTSD (post-traumatic stress disorder)     Past Surgical History:  Procedure Laterality Date   KNEE SURGERY     Family History: History reviewed. No pertinent family history. Family Psychiatric  History: unknown Social History:  Social History   Substance and Sexual Activity  Alcohol Use Yes   Alcohol/week: 42.0 standard drinks   Types: 42 Cans of beer per week   Comment: per pt he drinks 6-12 cans of beer daily     Social History   Substance and Sexual Activity  Drug Use Yes   Types: Cocaine, Marijuana    Social History   Socioeconomic History   Marital status: Single    Spouse name: Not on file   Number of children: Not on file   Years of education: Not on file   Highest education level: Not on file  Occupational History   Not on file  Tobacco Use   Smoking status: Every Day    Packs/day: 0.25    Types: Cigarettes   Smokeless tobacco: Never  Vaping Use   Vaping Use: Never used  Substance and  Sexual Activity   Alcohol use: Yes    Alcohol/week: 42.0 standard drinks    Types: 42 Cans of beer per week    Comment: per pt he drinks 6-12 cans of beer daily   Drug use: Yes    Types: Cocaine, Marijuana   Sexual activity: Not on file  Other Topics Concern   Not on file  Social History Narrative   Not on file   Social Determinants of Health   Financial Resource Strain: Not on file  Food Insecurity: Not on file  Transportation Needs: Not on file   Physical Activity: Not on file  Stress: Not on file  Social Connections: Not on file   Additional Social History:    Allergies:   Allergies  Allergen Reactions   Cheese Diarrhea   Eggs Or Egg-Derived Products Diarrhea   Peanut-Containing Drug Products Diarrhea    Labs:  Results for orders placed or performed during the hospital encounter of 11/08/21 (from the past 48 hour(s))  Comprehensive metabolic panel     Status: Abnormal   Collection Time: 11/08/21 10:09 PM  Result Value Ref Range   Sodium 142 135 - 145 mmol/L   Potassium 3.4 (L) 3.5 - 5.1 mmol/L   Chloride 104 98 - 111 mmol/L   CO2 22 22 - 32 mmol/L   Glucose, Bld 90 70 - 99 mg/dL    Comment: Glucose reference range applies only to samples taken after fasting for at least 8 hours.   BUN 17 6 - 20 mg/dL   Creatinine, Ser 1.59 (H) 0.61 - 1.24 mg/dL   Calcium 9.7 8.9 - 10.3 mg/dL   Total Protein 8.7 (H) 6.5 - 8.1 g/dL   Albumin 4.9 3.5 - 5.0 g/dL   AST 37 15 - 41 U/L   ALT 19 0 - 44 U/L   Alkaline Phosphatase 66 38 - 126 U/L   Total Bilirubin 0.7 0.3 - 1.2 mg/dL   GFR, Estimated 57 (L) >60 mL/min    Comment: (NOTE) Calculated using the CKD-EPI Creatinine Equation (2021)    Anion gap 16 (H) 5 - 15    Comment: Performed at [facility], [address] [place], [place] [address]  Ethanol     Status: None   Collection Time: 11/08/21 10:09 PM  Result Value Ref Range   Alcohol, Ethyl (B) <10 <10 mg/dL    Comment: (NOTE) Lowest detectable limit for serum alcohol is 10 mg/dL.  For medical purposes only. Performed at [facility], [address] [place], [place] [id number]   Salicylate level     Status: Abnormal   Collection Time: 11/08/21 10:09 PM  Result Value Ref Range   Salicylate Lvl Q000111Q (L) 7.0 - 30.0 mg/dL    Comment: Performed at [facility], [address] [place], [place] [address]  Acetaminophen level     Status: Abnormal   Collection Time: 11/08/21 10:09 PM  Result  Value Ref Range   Acetaminophen (Tylenol), Serum <10 (L) 10 - 30 ug/mL    Comment: (NOTE) Therapeutic concentrations vary significantly. A range of 10-30 ug/mL  may be an effective concentration for many patients. However, some  are best treated at concentrations outside of this range. Acetaminophen concentrations >150 ug/mL at 4 hours after ingestion  and >50 ug/mL at 12 hours after ingestion are often associated with  toxic reactions.  Performed at [facility], [address], [place], [place] [address]   cbc     Status: Abnormal  Collection Time: 11/08/21 10:09 PM  Result Value Ref Range   WBC 10.9 (H) 4.0 - 10.5 K/uL   RBC 4.78 4.22 - 5.81 MIL/uL   Hemoglobin 14.4 13.0 - 17.0 g/dL   HCT 87.5 64.3 - 32.9 %   MCV 91.4 80.0 - 100.0 fL   MCH 30.1 26.0 - 34.0 pg   MCHC 33.0 30.0 - 36.0 g/dL   RDW 51.8 84.1 - 66.0 %   Platelets 194 150 - 400 K/uL   nRBC 0.0 0.0 - 0.2 %    Comment: Performed at [facility], [address] [place], [place] [address]    No current facility-administered medications for this encounter.   Current Outpatient Medications  Medication Sig Dispense Refill   divalproex (DEPAKOTE) 500 MG DR tablet Take 1 tablet (500 mg total) by mouth every 12 (twelve) hours. 60 tablet 1   gabapentin (NEURONTIN) 100 MG capsule Take 1 capsule (100 mg total) by mouth 3 (three) times daily. 90 capsule 1   omeprazole (PRILOSEC) 40 MG capsule Take 40 mg by mouth daily.      Musculoskeletal: Strength & Muscle Tone: within normal limits Gait & Station: normal Patient leans: N/A  Psychiatric Specialty Exam: MENTAL STATUS EXAM: Appearance:  AAM, appearing stated age, appears well-nourished;  wearing appropriate hospital clothes, with fair grooming and hygiene. Normal level of alertness and appropriate facial expression.  Attitude/Behavior: calm, cooperative, engaging with appropriate eye contact.  Motor: WNL; dyskinesias not evident. Gait appears in  full range.  Speech: spontaneous, clear, coherent, normal comprehension.  Mood: Anxious  Affect: WDL  Thought process: Clear, coherent, logical  Thought content: patient denies suicidal thoughts, denies homicidal thoughts; no paranoia  Thought perception: WDL  Cognition: patient is alert and oriented in self, place, date.  Insight: fair  Judgement: fair  Physical Exam: Physical Exam Vitals and nursing note reviewed.  Constitutional:      Appearance: Normal appearance.  HENT:     Head: Normocephalic.     Nose: Nose normal.  Pulmonary:     Effort: Pulmonary effort is normal.  Musculoskeletal:        General: Normal range of motion.     Cervical back: Normal range of motion.  Neurological:     General: No focal deficit present.     Mental Status: He is alert.  Psychiatric:        Attention and Perception: Attention and perception normal.        Mood and Affect: Mood is anxious.        Speech: Speech normal.        Behavior: Behavior normal. Behavior is cooperative.        Thought Content: Thought content normal.        Cognition and Memory: Cognition and memory normal.        Judgment: Judgment normal.   Review of Systems  Psychiatric/Behavioral:  Positive for substance abuse. The patient is nervous/anxious.   All other systems reviewed and are negative. Blood pressure 104/60, pulse 88, temperature 98.1 F (36.7 C), temperature source Oral, resp. rate 18, height 5\' 11"  (1.803 m), weight 114.8 kg, SpO2 97 %. Body mass index is 35.3 kg/m.  Treatment Plan Summary: Daily contact with patient to assess and evaluate symptoms and progress in treatment and Medication management  ASSESSMENT: Methamphetamine induced psychosis: -Started gabapentin 300 mg TID -Started Haldol 2 mg BID  Mood disorder: -Started Depakote 500 mg BID, responded well in the past to this   Disposition:  Admit to [place] for stabilization   Waylan Boga, NP 11/09/2021 9:30 AM

## 2021-11-09 NOTE — ED Notes (Signed)
Report to [facility] nurse.  Pt to be transported at this time.

## 2021-11-09 NOTE — BH Assessment (Signed)
Spoke with [facility] [phone] ex [phone]) they are on diversion, and lone with other VA's. The patient can be admitted to [facility].

## 2021-11-09 NOTE — BH Assessment (Signed)
Comprehensive Clinical Assessment (CCA) Note  11/09/2021 Jeremy Frank [id number]  Chief Complaint:  Chief Complaint  Patient presents with   Psychiatric Evaluation   Visit Diagnosis: Psychosis    Jeremy Frank 35 year old male who presents the ER after he was found in the community talking to his self and paranoid. Upon arrival to the ER he was agitated and irritable. He had to receive IM medication. Patient admits to the use of substance use and it increased the amount of "shadow people" that are trying to harm him. He states he want to hurt the shadow people because they are tying to harm him, they took his money and putting him out of his apartment.  During the interview the patient attempted to be cooperative but there was some difficulty of answering the questions. He admits the use of substance. Patient receives treatment from [facility] and it's unclear when the last time he followed up with treatment. Per the conversation from pervious ER visit, the patient is currently on probation. He is to follow up with [facility] (local community health) for treatment.  CCA Screening, Triage and Referral (STR)  Patient Reported Information How did you hear about [place]? Other (Comment)  What Is the Reason for Your Visit/Call Today? Patient was found in the public responding to internal stimuli and paranoid.  How Long Has This Been Causing You Problems? 1 wk - 1 month  What Do You Feel Would Help You the Most Today? Treatment for Depression or other mood problem; Alcohol or Drug Use Treatment   Have You Recently Had Any Thoughts About Hurting Yourself? No  Are You Planning to Commit Suicide/Harm Yourself At This time? No   Have you Recently Had Thoughts About Jeremy Frank? No  Are You Planning to Harm Someone at This Time? No  Explanation: No data recorded  Have You Used Any Alcohol or Drugs in the Past 24 Hours? Yes  How Long Ago Did You Use Drugs or Alcohol? No data  recorded What Did You Use and How Much? Methamphetamine, cocaine, cannabis and alcohol   Do You Currently Have a Therapist/Psychiatrist? Yes  Name of Therapist/Psychiatrist: [place] Recently Discharged From Any M[facility] or Programs? No  Explanation of Discharge From Practice/Program: No data recorded    CCA Screening Triage Referral Assessment Type of Contact: Face-to-Face  Telemedicine Service Delivery:   Is this Initial or Reassessment? No data recorded Date Telepsych consult ordered in [facility]:  No data recorded Time Telepsych consult ordered in [facility]:  No data recorded Location of Assessment: [facility] ED  Provider Location: [facility] ED   Collateral Involvement: No data recorded  Does Patient Have a Standard Frank? No data recorded Name and Contact of Legal Guardian: No data recorded If Minor and Not Living with Parent(s), Who has Custody? No data recorded Is CPS involved or ever been involved? Never  Is APS involved or ever been involved? Never   Patient Determined To Be At Risk for Harm To Self or Others Based on Review of Patient Reported Information or Presenting Complaint? No  Method: No data recorded Availability of Means: No data recorded Intent: No data recorded Notification Required: No data recorded Additional Information for Danger to Others Potential: No data recorded Additional Comments for Danger to Others Potential: No data recorded Are There Guns or Other Weapons in Your Home? No data recorded Types of Guns/Weapons: No data recorded Are These Weapons Safely Secured?  No data recorded Who Could Verify You Are Able To Have These Secured: No data recorded Do You Have any Outstanding Charges, Pending Court Dates, Parole/Probation? No data recorded Contacted To Inform of Risk of Harm To Self or Others: No data recorded   Does Patient Present under Involuntary Commitment? Yes  IVC Papers Initial  File Date: 11/09/21   [place] of Residence: [place]   Patient Currently Receiving the Following Services: Medication Management   Determination of Need: Emergent (2 hours)   Options For Referral: Inpatient Hospitalization     CCA Biopsychosocial Patient Reported Schizophrenia/Schizoaffective Diagnosis in Past: Yes   Strengths: Some insight, have tried to get help in the past, have support system.   Mental Health Symptoms Depression:   Hopelessness   Duration of Depressive symptoms:  Duration of Depressive Symptoms: Greater than two weeks   Mania:   Racing thoughts; Recklessness   Anxiety:    Restlessness; Worrying   Psychosis:   Hallucinations   Duration of Psychotic symptoms:  Duration of Psychotic Symptoms: Greater than six months   Trauma:   N/A   Obsessions:   N/A   Compulsions:   N/A   Inattention:   N/A   Hyperactivity/Impulsivity:   N/A   Oppositional/Defiant Behaviors:   N/A   Emotional Irregularity:   N/A   Other Mood/Personality Symptoms:  No data recorded   Mental Status Exam Appearance and self-care  Stature:   Average   Weight:   Average weight   Clothing:   Neat/clean; Age-appropriate   Grooming:   Normal   Cosmetic use:   None   Posture/gait:   Normal   Motor activity:   -- (Within normal range)   Sensorium  Attention:   Normal   Concentration:   Normal   Orientation:   X5   Recall/memory:   Normal   Affect and Mood  Affect:   Appropriate; Depressed; Full Range   Mood:   Anxious   Relating  Eye contact:   None   Facial expression:   Anxious; Responsive   Attitude toward examiner:   Guarded; Suspicious; Cooperative   Thought and Language  Speech flow:  Clear and Coherent   Thought content:   Appropriate to Mood and Circumstances   Preoccupation:   Phobias   Hallucinations:   Auditory; Visual   Organization:  No data recorded  [employer] of Knowledge:    Fair   Intelligence:   Average   Abstraction:   Normal   Judgement:   Fair   Dance movement psychotherapist:   Distorted   Insight:   Poor   Decision Making:   Impulsive   Social Functioning  Social Maturity:   Irresponsible; Impulsive   Social Judgement:   "Chief of Staff"; Heedless; Victimized   Stress  Stressors:   Relationship; Financial; Armed forces operational officer; Work; Transitions   Coping Ability:   Normal   Skill Deficits:   Scientist, physiological; Self-control   Supports:   Friends/Service system     Religion: Religion/Spirituality Are You A Religious Person?: No  Leisure/Recreation: Leisure / Recreation Do You Have Hobbies?: No  Exercise/Diet: Exercise/Diet Do You Exercise?: No Have You Gained or Lost A Significant Amount of Weight in the Past Six Months?: No Do You Follow a Special Diet?: No Do You Have Any Trouble Sleeping?: Yes Explanation of Sleeping Difficulties: Trouble falling and staying asleep   CCA Employment/Education Employment/Work Situation: Employment / Work Situation Employment Situation: Unemployed Patient's Job has Been Impacted by Current Illness: No  Has Patient ever Been in the [place]?: Yes (Describe in comment) Did You Receive Any Psychiatric Treatment/Services While in the [place]?: No Type of Psychiatric Treatment/Services in [employer]: Receive treatment from [employer]: Education Is Patient Currently Attending School?: No Did You Have An Individualized Education Program (IIEP): No Did You Have Any Difficulty At [employer]?: No Patient's Education Has Been Impacted by Current Illness: No   CCA Family/Childhood History Family and Relationship History: Family history Marital status: Single Does patient have children?: Yes How many children?: 1 How is patient's relationship with their children?: Distant  Childhood History:  Childhood History By whom was/is the patient raised?: Mother Did patient suffer any  verbal/emotional/physical/sexual abuse as a child?: No Did patient suffer from severe childhood neglect?: No Has patient ever been sexually abused/assaulted/raped as an adolescent or adult?: No Was the patient ever a victim of a crime or a disaster?: No Witnessed domestic violence?: No Has patient been affected by domestic violence as an adult?: No  Child/Adolescent Assessment:     CCA Substance Use Alcohol/Drug Use: Alcohol / Drug Use Pain Medications: See PTA Prescriptions: See PTA Over the Counter: See PTA Longest period of sobriety (when/how long): Unable to quantify Substance #1 Name of Substance 1: Methamphetamine Substance #2 Name of Substance 2: Cocaine Substance #3 Name of Substance 3: Cannabis Substance #4 Name of Substance 4: Alcohol                 ASAM's:  Six Dimensions of Multidimensional Assessment  Dimension 1:  Acute Intoxication and/or Withdrawal Potential:      Dimension 2:  Biomedical Conditions and Complications:      Dimension 3:  Emotional, Behavioral, or Cognitive Conditions and Complications:     Dimension 4:  Readiness to Change:     Dimension 5:  Relapse, Continued use, or Continued Problem Potential:     Dimension 6:  Recovery/Living Environment:     ASAM Severity Score:    ASAM Recommended Level of Treatment:     Substance use Disorder (SUD)    Recommendations for Services/Supports/Treatments:    Discharge Disposition:    DSM5 Diagnoses: Patient Active Problem List   Diagnosis Date Noted   Cannabis dependence, continuous ([place]) 06/18/2021   Alcohol abuse with alcohol-induced mood disorder ([place]) 06/18/2021   Crohn's disease ([place]) 06/18/2021   Nicotine dependence 06/18/2021   Other personal history presenting hazards to health 06/18/2021   Peptic ulcer 06/18/2021   Severe alcohol dependence ([place]) 06/18/2021   Major depressive disorder, recurrent episode, severe, with psychosis ([place]) 06/16/2021   Amphetamine and  psychostimulant-induced psychosis with hallucinations ([place]) 10/19/2020   Hypertension 10/19/2020   Psychosis ([place]) 06/13/2015   Posttraumatic stress disorder 06/13/2015   Traumatic brain injury ([place]) 06/13/2015   Cocaine dependence ([place]) 06/13/2015   Marijuana abuse 06/13/2015    Referrals to Alternative Service(s): Referred to Alternative Service(s):   Place:   Date:   Time:    Referred to Alternative Service(s):   Place:   Date:   Time:    Referred to Alternative Service(s):   Place:   Date:   Time:    Referred to Alternative Service(s):   Place:   Date:   Time:     Gunnar Fusi MS, LCAS, [facility], [facility] Therapeutic Triage Specialist 11/09/2021 1:56 PM

## 2021-11-09 NOTE — Progress Notes (Signed)
Admission Note  Patient admitted to behavorial health unit IV c'd for auditory and visual hallucinations as well as homicidal ideations towards individual in boarding home. Patient is guarded and reluctant to answer questions during admission assessment. Denies SI/HI/AH/VH and pain. But refuses to answer all other questions about anxiety, depression, personal contact person ect. Patient's response was " I don't know you." 'I'm incompetent." Patient followed some commands but was falling asleep during assessment.   Vital signs stable and skin assessment completed with RN's x2. Cont Q15 minute check for safety.

## 2021-11-09 NOTE — ED Notes (Signed)
Report received, care of pt assumed.  Pt remains in bed, eyes closed, resp even and nonlabored.  VS deferred at this time, will obtain once pt is awake.

## 2021-11-09 NOTE — BH Assessment (Signed)
Patient is to be admitted to [facility] by Psychiatric Nurse Practitioner Nanine Means.  Attending Physician will be Dr.  Toni Amend .   Patient has been assigned to room 322, by [facility] Charge Nurse [place].   ER staff is aware of the admission: Ronnie,ER Secretary   Amy B., Patient's Nurse  Joyce Gross, Patient Access.

## 2021-11-10 MED ORDER — ZIPRASIDONE MESYLATE 20 MG IM SOLR
20.0000 mg | Freq: Three times a day (TID) | INTRAMUSCULAR | Status: DC | PRN
  Administered 2021-11-10: 20 mg via INTRAMUSCULAR
  Filled 2021-11-10: qty 20

## 2021-11-10 MED ORDER — LORAZEPAM 2 MG/ML IJ SOLN
2.0000 mg | Freq: Once | INTRAMUSCULAR | Status: AC
  Administered 2021-11-10: 2 mg via INTRAMUSCULAR
  Filled 2021-11-10: qty 1

## 2021-11-10 MED ORDER — HALOPERIDOL LACTATE 5 MG/ML IJ SOLN: 5.0000 mg | Freq: Four times a day (QID) | INTRAMUSCULAR | Status: DC | PRN

## 2021-11-10 MED ORDER — DIPHENHYDRAMINE HCL 50 MG/ML IJ SOLN: 50.0000 mg | Freq: Four times a day (QID) | INTRAMUSCULAR | Status: DC | PRN

## 2021-11-10 MED ORDER — LORAZEPAM 2 MG/ML IJ SOLN: 2.0000 mg | Freq: Four times a day (QID) | INTRAMUSCULAR | Status: DC | PRN

## 2021-11-10 MED ORDER — DIPHENHYDRAMINE HCL 50 MG/ML IJ SOLN
50.0000 mg | Freq: Once | INTRAMUSCULAR | Status: AC
  Administered 2021-11-10: 50 mg via INTRAMUSCULAR
  Filled 2021-11-10: qty 1

## 2021-11-10 NOTE — [facility] Suicide Risk Assessment (Signed)
Suicide Risk Assessment  Admission Assessment    [facility] Admission Suicide Risk Assessment   Nursing information obtained from:  Patient Demographic factors:  Male, Adolescent or young adult, Low socioeconomic status, Living alone, Unemployed Current Mental Status:  NA Loss Factors:  NA Historical Factors:  Impulsivity Risk Reduction Factors:  NA  Total Time spent with patient: 20 minutes Principal Problem: <principal problem not specified> Diagnosis:  Active Problems:   Major depressive disorder, recurrent episode, severe, with psychosis ([place])  Subjective Data: Jeremy Frank is a 35 y.o. male patient with psych history of PTSD, depression, substance use disorder, who was admitted to ER with with hallucinations and paranoia. Patient reports he is experiencing auditory and visual hallucination - voices commanding him to hurt himself and seeing militant people. He disclosed not taking psychotropic medications for about a month. He also admitted to substance use - amphetamines and THC. He reports hallucinations make him feel depressed and anxious. Reports also feeling paranoid that he is in danger. He denies feeling suicidal or homicidal, although reports not feeling safe from impulsively hurting self due to commanding voices. 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 He denies any current physical complaints. He agreed to restart Abilify at a dose 10mg  daily.  Continued Clinical Symptoms:  Alcohol Use Disorder Identification Test Final Score (AUDIT): 2 The "Alcohol Use Disorders Identification Test", Guidelines for Use in Primary Care, Second Edition.  World Pharmacologist [facility]). Score between 0-7:  no or low risk or alcohol related problems. Score between 8-15:  moderate risk of alcohol related problems. Score between 16-19:  high risk of alcohol  related problems. Score 20 or above:  warrants further diagnostic evaluation for alcohol dependence and treatment.   CLINICAL FACTORS:   Depression:   Aggression Comorbid alcohol abuse/dependence Impulsivity More than one psychiatric diagnosis Currently Psychotic   Musculoskeletal: Strength & Muscle Tone: within normal limits Gait & Station: normal Patient leans: N/A  Psychiatric Specialty Exam:  Presentation  General Appearance: Appropriate for Environment; Casual  Eye Contact:None  Speech:Slow  Speech Volume:Decreased  Handedness:Right   Mood and Affect  Mood:Dysphoric; Anxious; Irritable  Affect:Restricted   Thought Process  Thought Processes:Coherent  Descriptions of Associations:Circumstantial  Orientation:Full (Time, Place and Person)  Thought Content:Paranoid Ideation  History of Schizophrenia/Schizoaffective disorder:No  Duration of Psychotic Symptoms:Less than six months  Hallucinations:Hallucinations: Auditory  Ideas of Reference:Paranoia  Suicidal Thoughts:Suicidal Thoughts: No  Homicidal Thoughts:Homicidal Thoughts: No   Sensorium  Memory:Immediate Fair; Remote Fair  Judgment:Impaired  Insight:Poor   Executive Functions  Concentration:Fair  Attention Span:Fair  [place]   Psychomotor Activity  Psychomotor Activity:Psychomotor Activity: Normal   Assets  Assets:Communication Skills; Desire for Improvement; Financial Resources/Insurance; Physical Health; Resilience; Social Support; Talents/Skills   Sleep  Sleep:No data recorded   Physical Exam: Physical Exam ROS Blood pressure 109/80, pulse 84, temperature 98.6 F (37 C), temperature source Oral, resp. rate 20, height 5\' 11"  (1.803 m), weight 103.4 kg, SpO2 98 %. Body mass index is 31.8 kg/m.   COGNITIVE FEATURES THAT CONTRIBUTE TO RISK:  Closed-mindedness    SUICIDE RISK:   Moderate:  Frequent suicidal ideation  with limited intensity, and duration, some specificity in terms of plans, no associated intent, good self-control, limited dysphoria/symptomatology, some risk factors present, and identifiable protective factors, including available and accessible social  support.  PLAN OF CARE: Continue current meds.   I certify that inpatient services furnished can reasonably be expected to improve the patient's condition.   Leo Rod 11/10/2021, 11:50 AM

## 2021-11-10 NOTE — Group Note (Signed)
LCSW Group Therapy Note  Group Date: 11/10/2021 Start Time: 1315 End Time: 1400   Type of Therapy and Topic:  Group Therapy - How To Cope with Nervousness about Discharge   Participation Level:  Did Not Attend   Description of Group This process group involved identification of patients' feelings about discharge. Some of them are scheduled to be discharged soon, while others are new admissions, but each of them was asked to share thoughts and feelings surrounding discharge from the hospital. One common theme was that they are excited at the prospect of going home, while another was that many of them are apprehensive about sharing why they were hospitalized. Patients were given the opportunity to discuss these feelings with their peers in preparation for discharge.  Therapeutic Goals  Patient will identify their overall feelings about pending discharge. Patient will think about how they might proactively address issues that they believe will once again arise once they get home (i.e. with parents). Patients will participate in discussion about having hope for change.   Summary of Patient Progress: Patient did not attend group despite encouraged participation.    Therapeutic Modalities Cognitive Behavioral Therapy   [name], [name] 11/10/2021  2:16 PM

## 2021-11-10 NOTE — Progress Notes (Signed)
Patient refused to answer any questions including suicide risk assessment, refused scheduled medications, Isolative to his room and irritable. Encouragement provided, Q 15 minutes safety checks ongoing. Patient appears to be in no distress. Will continue to monitor.

## 2021-11-10 NOTE — [facility] Counselor (Signed)
This CSW greeted patient at bedside in attempt to complete PSA with patient. Patient responded to this CSW when greeted and was observed laying in bed turned away from the door. CSW introduced self and role and asked patient if he was willing to complete an assessment. Patient responded, "no." Another attempt will need to be made at a later time.   Albertine Patricia, MSW, [place], [place] 11/10/2021 9:14AM

## 2021-11-10 NOTE — Progress Notes (Signed)
This Presenter, broadcasting while doing rounds noticed a curtain outside the Patients door. Went to check and noticed the room was destroyed the curtains had been pulled off, the mattress were on the floor, trash can was turned upside down, laundry basket turned upside down with clothes all over the room and covers all over the room and trash. Pillows had been tore into pieces. This RN went to check for the Patient and the Patient was in the day room. I later went to and notified the Press photographer. The Charge Nurse and I went to the Patient room to look the Patient was in the room. We asked the Patient if he was feeling alright if anything was bothering him. Patient was started yelling " I need to get out of this place I called the Police and they brought me here, I am not suppose to be here. I need to get discharged and go get out and get HIGH" " In fact I am trying to figure a way out to get out through this window and leave" Patient kept yelling while the Charge, and RN were listening, tried to de-escalate Patient to no avail. PRN medication for anxiety and agitation offered to Patient and Patient refused. "This medication do not work they do not help me" We closed the Patient door to give him time and went and notified the Provider. New order of Geodon IM ordered (see Mar)  Patient continued to escalate, ripping up sheets and pillows in the room, making threats to harm staff, threats to leave. Staff continued verbal de-escalation, patient continued to yell and refuse staff support, also stating he wasn't going to take medication. Poured coffee on the floor Patient then barricaded himself in the bathroom, sitting on the toilet continuing to make threats to staff, refusing medication. Staffed then offered IM medication, he refused to comply. Patient then began swinging and posturing towards staff, staff then went hands on engaging in an upper arm shoulder hold to administer medication safely and to keep himself and Staff safe  from harm. Staff was able to administer IM medication, Patient continued to being aggressive against Staff refusing to calm down and comply with verbal direction. Staff then transferred him to the restrain chair, with Provider order then transported Patient to the seclusion room. Patient observed 1:1 while in restrain chair. Provider came to do the one to one assessment, Patient continued to be agitated while speaking with Provider. After speaking with Patient, Provider ordered Ativan and benadryl (see Mar) IM medication. Additional IM medication was administered. Water, food and toilet were offered to Patient. All six points restrains were assessed. V/S checked every 15 minutes while Patient was in open door seclusion room.   Patient slowly calmed down. At approximately 16:30, the first restraint point was released. Staff continued to talk with Patient about contracting for safety. The remaining restraint points were released every 15 minutes, with the last restraint removed at 17;25. Patient contracted for safety and then walked, ambulating well, with Staff back to his new assigned room. Patient ate his dinner and went to sleep. Q 15 minutes safety checks ongoing Patient remains safe.

## 2021-11-10 NOTE — H&P (Signed)
Psychiatric Admission Assessment Adult  Patient Identification: Jeremy Frank MRN:  [id number] Date of Evaluation:  11/10/2021 Chief Complaint:  Major depressive disorder, recurrent episode, severe, with psychosis ([place]) [F33.3] Principal Diagnosis: <principal problem not specified> Diagnosis:  Active Problems:   Major depressive disorder, recurrent episode, severe, with psychosis ([place])  History of Present Illness: Jeremy Frank is a 35 y.o. male patient with psych history of PTSD, depression, substance use disorder, who was admitted to ER with with hallucinations and paranoia. Patient reports he is experiencing auditory and visual hallucination - voices commanding him to hurt himself and seeing militant people. He disclosed not taking psychotropic medications for about a month. He also admitted to substance use - amphetamines and THC. He reports hallucinations make him feel depressed and anxious. Reports also feeling paranoid that he is in danger. He denies feeling suicidal or homicidal, although reports not feeling safe from impulsively hurting self due to commanding voices. 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 He denies any current physical complaints. He agreed to restart Abilify at a dose 10mg  daily.  Patient has been sleeping in his room most of the and has not been much co-operative with staff. He has been sporadically compliant with his meds and this morning he refused to get up or come out of his room to speak to me. He kept repeating he wants to go home and is denying current SI/HI. He does acknowledge getting paranoid and that someone was in his room when he had called cops.    Associated Signs/Symptoms: Depression Symptoms:  insomnia, psychomotor agitation, anxiety, disturbed sleep, Duration of Depression Symptoms: Greater than two  weeks  (Hypo) Manic Symptoms:  Delusions, Hallucinations, Impulsivity, Irritable Mood, Anxiety Symptoms:  Excessive Worry, Psychotic Symptoms:  Hallucinations: Auditory PTSD Symptoms: Had a traumatic exposure:  Military related Total Time spent with patient: 45 minutes  Past Psychiatric History: MDD, anxiety, PTSD, cocaine use d/o, stimulant use disorder. Reports h/o past suicidal attempts and psych admissions. Past med trials: Abilify, Depakote, Prazosin, Haldol, Risperidone  Is the patient at risk to self? Yes.    Has the patient been a risk to self in the past 6 months? No.  Has the patient been a risk to self within the distant past? No.  Is the patient a risk to others? Yes.    Has the patient been a risk to others in the past 6 months? No.  Has the patient been a risk to others within the distant past? No.   Prior Inpatient Therapy:   Prior Outpatient Therapy:    Alcohol Screening: Patient refused Alcohol Screening Tool: Yes 1. How often do you have a drink containing alcohol?: Monthly or less 2. How many drinks containing alcohol do you have on a typical day when you are drinking?: 1 or 2 3. How often do you have six or more drinks on one occasion?: Less than monthly AUDIT-C Score: 2 4. How often during the last year have you found that you were not able to stop drinking once you had started?: Never 5. How often during the last year have you failed to do what was normally expected from you because of drinking?: Never 6. How often during the last year have you needed a first drink in the morning to get yourself going after a heavy drinking session?: Never 7.  How often during the last year have you had a feeling of guilt of remorse after drinking?: Never 8. How often during the last year have you been unable to remember what happened the night before because you had been drinking?: Never 9. Have you or someone else been injured as a result of your drinking?: No 10. Has a  relative or friend or a doctor or another health worker been concerned about your drinking or suggested you cut down?: No Alcohol Use Disorder Identification Test Final Score (AUDIT): 2 Substance Abuse History in the last 12 months:  Yes.   Consequences of Substance Abuse: Psychosis Previous Psychotropic Medications: Yes  Psychological Evaluations: No  Past Medical History:  Past Medical History:  Diagnosis Date   Drug dependence ([place])    GI bleed    Hiatal hernia    Hypertension    PTSD (post-traumatic stress disorder)     Past Surgical History:  Procedure Laterality Date   KNEE SURGERY     Family History: History reviewed. No pertinent family history. Family Psychiatric  History: Denies  Tobacco Screening:   Social History:  Social History   Substance and Sexual Activity  Alcohol Use Yes   Alcohol/week: 42.0 standard drinks   Types: 42 Cans of beer per week   Comment: per pt he drinks 6-12 cans of beer daily     Social History   Substance and Sexual Activity  Drug Use Yes   Types: Cocaine, Marijuana    Additional Social History:                           Allergies:   Allergies  Allergen Reactions   Peanut-Containing Drug Products Diarrhea, Anaphylaxis and Swelling   Cheese Diarrhea   Eggs Or Egg-Derived Products Diarrhea   Lab Results:  Results for orders placed or performed during the hospital encounter of 11/08/21 (from the past 48 hour(s))  Comprehensive metabolic panel     Status: Abnormal   Collection Time: 11/08/21 10:09 PM  Result Value Ref Range   Sodium 142 135 - 145 mmol/L   Potassium 3.4 (L) 3.5 - 5.1 mmol/L   Chloride 104 98 - 111 mmol/L   CO2 22 22 - 32 mmol/L   Glucose, Bld 90 70 - 99 mg/dL    Comment: Glucose reference range applies only to samples taken after fasting for at least 8 hours.   BUN 17 6 - 20 mg/dL   Creatinine, Ser 1.59 (H) 0.61 - 1.24 mg/dL   Calcium 9.7 8.9 - 10.3 mg/dL   Total Protein 8.7 (H) 6.5 - 8.1 g/dL    Albumin 4.9 3.5 - 5.0 g/dL   AST 37 15 - 41 U/L   ALT 19 0 - 44 U/L   Alkaline Phosphatase 66 38 - 126 U/L   Total Bilirubin 0.7 0.3 - 1.2 mg/dL   GFR, Estimated 57 (L) >60 mL/min    Comment: (NOTE) Calculated using the CKD-EPI Creatinine Equation (2021)    Anion gap 16 (H) 5 - 15    Comment: Performed at [facility], [address] [place], [place] [address]  Ethanol     Status: None   Collection Time: 11/08/21 10:09 PM  Result Value Ref Range   Alcohol, Ethyl (B) <10 <10 mg/dL    Comment: (NOTE) Lowest detectable limit for serum alcohol is 10 mg/dL.  For medical purposes only. Performed at [facility], [place], [place]  [id number]   Salicylate level     Status: Abnormal   Collection Time: 11/08/21 10:09 PM  Result Value Ref Range   Salicylate Lvl [id number] (L) 7.0 - 30.0 mg/dL    Comment: Performed at [facility], [address] [place], [place] [address]  Acetaminophen level     Status: Abnormal   Collection Time: 11/08/21 10:09 PM  Result Value Ref Range   Acetaminophen (Tylenol), Serum <10 (L) 10 - 30 ug/mL    Comment: (NOTE) Therapeutic concentrations vary significantly. A range of 10-30 ug/mL  may be an effective concentration for many patients. However, some  are best treated at concentrations outside of this range. Acetaminophen concentrations >150 ug/mL at 4 hours after ingestion  and >50 ug/mL at 12 hours after ingestion are often associated with  toxic reactions.  Performed at [facility], [address] [place], [place]  [address]   cbc     Status: Abnormal   Collection Time: 11/08/21 10:09 PM  Result Value Ref Range   WBC 10.9 (H) 4.0 - 10.5 K/uL   RBC 4.78 4.22 - 5.81 MIL/uL   Hemoglobin 14.4 13.0 - 17.0 g/dL   HCT 43.7 39.0 - 52.0 %   MCV 91.4 80.0 - 100.0 fL   MCH 30.1 26.0 - 34.0 pg   MCHC 33.0 30.0 - 36.0 g/dL   RDW 12.8 11.5 - 15.5 %   Platelets 194 150 - 400 K/uL   nRBC 0.0 0.0 - 0.2 %     Comment: Performed at [facility], [address], [place], [place] [address]  Resp Panel by RT-PCR (Flu A&B, Covid) Nasopharyngeal Swab     Status: None   Collection Time: 11/09/21  2:15 PM   Specimen: Nasopharyngeal Swab; Nasopharyngeal(NP) swabs in vial transport medium  Result Value Ref Range   SARS Coronavirus 2 by RT PCR NEGATIVE NEGATIVE    Comment: (NOTE) SARS-CoV-2 target nucleic acids are NOT DETECTED.  The SARS-CoV-2 RNA is generally detectable in upper respiratory specimens during the acute phase of infection. The lowest concentration of SARS-CoV-2 viral copies this assay can detect is 138 copies/mL. A negative result does not preclude SARS-Cov-2 infection and should not be used as the sole basis for treatment or other patient management decisions. A negative result may occur with  improper specimen collection/handling, submission of specimen other than nasopharyngeal swab, presence of viral mutation(s) within the areas targeted by this assay, and inadequate number of viral copies(<138 copies/mL). A negative result must be combined with clinical observations, patient history, and epidemiological information. The expected result is Negative.  Fact Sheet for Patients:  EntrepreneurPulse.com.au  Fact Sheet for Healthcare Providers:  IncredibleEmployment.be  This test is no t yet approved or cleared by the [place] FDA and  has been authorized for detection and/or diagnosis of SARS-CoV-2 by FDA under an Emergency Use Authorization (EUA). This EUA will remain  in effect (meaning this test can be used) for the duration of the COVID-19 declaration under Section 564(b)(1) of the Act, 21 U.S.C.section 360bbb-3(b)(1), unless the authorization is terminated  or revoked sooner.       Influenza A by PCR NEGATIVE NEGATIVE   Influenza B by PCR NEGATIVE NEGATIVE    Comment: (NOTE) The Xpert Xpress SARS-CoV-2/FLU/RSV plus assay  is intended as an aid in the diagnosis of influenza from Nasopharyngeal swab specimens and should not be used as a sole basis for treatment. Nasal washings and aspirates are unacceptable for Xpert Xpress SARS-CoV-2/FLU/RSV testing.  Fact Sheet for Patients: BloggerCourse.com  Fact Sheet for Healthcare Providers: SeriousBroker.it  This test is not yet approved or cleared by the [place] FDA and has been authorized for detection and/or diagnosis of SARS-CoV-2 by FDA under an Emergency Use Authorization (EUA). This EUA will remain in effect (meaning this test can be used) for the duration of the COVID-19 declaration under Section 564(b)(1) of the Act, 21 U.S.C. section 360bbb-3(b)(1), unless the authorization is terminated or revoked.  Performed at [facility], [address] [place], [place] [address]     Blood Alcohol level:  Lab Results  Component Value Date   [facility] <10 11/08/2021   ETH <10 11/01/2021    Metabolic Disorder Labs:  Lab Results  Component Value Date   HGBA1C 5.8 (H) 06/19/2021   MPG 119.76 06/19/2021   No results found for: PROLACTIN Lab Results  Component Value Date   CHOL 145 06/19/2021   TRIG 63 06/19/2021   HDL 60 06/19/2021   CHOLHDL 2.4 06/19/2021   VLDL 13 06/19/2021   LDLCALC 72 06/19/2021    Current Medications: Current Facility-Administered Medications  Medication Dose Route Frequency Provider Last Rate Last Admin   acetaminophen (TYLENOL) tablet 650 mg  650 mg Oral Q6H PRN Charm Rings, NP       alum & mag hydroxide-simeth (MAALOX/MYLANTA) 200-200-20 MG/5ML suspension 30 mL  30 mL Oral Q4H PRN Charm Rings, NP       divalproex (DEPAKOTE) DR tablet 500 mg  500 mg Oral Q12H Charm Rings, NP       gabapentin (NEURONTIN) capsule 300 mg  300 mg Oral TID Charm Rings, NP       haloperidol (HALDOL) tablet 2 mg  2 mg Oral BID Charm Rings, NP       magnesium  hydroxide (MILK OF MAGNESIA) suspension 30 mL  30 mL Oral Daily PRN Charm Rings, NP       pantoprazole (PROTONIX) EC tablet 80 mg  80 mg Oral Daily Charm Rings, NP       PTA Medications: Medications Prior to Admission  Medication Sig Dispense Refill Last Dose   divalproex (DEPAKOTE) 500 MG DR tablet Take 1 tablet (500 mg total) by mouth every 12 (twelve) hours. (Patient not taking: No sig reported) 60 tablet 1    gabapentin (NEURONTIN) 100 MG capsule Take 1 capsule (100 mg total) by mouth 3 (three) times daily. (Patient not taking: No sig reported) 90 capsule 1    omeprazole (PRILOSEC) 40 MG capsule Take 40 mg by mouth daily. (Patient not taking: No sig reported)       Musculoskeletal: Strength & Muscle Tone: within normal limits Gait & Station: normal Patient leans: N/A            Psychiatric Specialty Exam:  Presentation  General Appearance: Appropriate for Environment; Casual  Eye Contact:None  Speech:Slow  Speech Volume:Decreased  Handedness:Right   Mood and Affect  Mood:Dysphoric; Anxious; Irritable  Affect:Restricted   Thought Process  Thought Processes:Coherent  Duration of Psychotic Symptoms: Less than six months  Past Diagnosis of Schizophrenia or Psychoactive disorder: No  Descriptions of Associations:Circumstantial  Orientation:Full (Time, Place and Person)  Thought Content:Paranoid Ideation  Hallucinations:Hallucinations: Auditory Ideas of Reference:Paranoia  Suicidal Thoughts:Suicidal Thoughts: No Homicidal Thoughts:Homicidal Thoughts: No  Sensorium  Memory:Immediate Fair; Remote Fair  Judgment:Impaired  Insight:Poor   Executive Functions  Concentration:Fair  Attention Span:Fair  Recall:Fair  Fund of Knowledge:Fair  Language:Fair   Psychomotor Activity  Psychomotor Activity:Psychomotor Activity: Normal  Assets  Assets:Communication Skills; Desire for Improvement; Financial Resources/Insurance; Physical  Health; Resilience; Social Support; Talents/Skills   Sleep  Sleep:No data recorded   Physical Exam: Physical Exam Constitutional:      Appearance: Normal appearance. He is normal weight.  HENT:     Head: Normocephalic and atraumatic.     Nose: Nose normal.     Mouth/Throat:     Mouth: Mucous membranes are moist.     Pharynx: Oropharynx is clear.  Eyes:     Extraocular Movements: Extraocular movements intact.     Conjunctiva/sclera: Conjunctivae normal.     Pupils: Pupils are equal, round, and reactive to light.  Cardiovascular:     Rate and Rhythm: Normal rate and regular rhythm.     Heart sounds: Normal heart sounds.  Pulmonary:     Breath sounds: Normal breath sounds.  Abdominal:     General: Abdomen is flat. Bowel sounds are normal.     Palpations: Abdomen is soft.  Musculoskeletal:        General: Normal range of motion.     Cervical back: Neck supple.  Skin:    General: Skin is warm and dry.  Neurological:     General: No focal deficit present.     Mental Status: He is alert.   Review of Systems  Constitutional: Negative.   HENT: Negative.    Eyes: Negative.   Respiratory: Negative.    Cardiovascular: Negative.   Gastrointestinal: Negative.   Genitourinary: Negative.   Musculoskeletal: Negative.   Skin: Negative.   Neurological: Negative.   Endo/Heme/Allergies: Negative.   Psychiatric/Behavioral:  Positive for hallucinations and substance abuse. The patient is nervous/anxious.   Blood pressure 109/80, pulse 84, temperature 98.6 F (37 C), temperature source Oral, resp. rate 20, height 5\' 11"  (1.803 m), weight 103.4 kg, SpO2 98 %. Body mass index is 31.8 kg/m.  Treatment Plan Summary: Daily contact with patient to assess and evaluate symptoms and progress in treatment, Medication management, and Plan : Continue current meds.   Observation Level/Precautions:  15 minute checks  Laboratory:   none  Psychotherapy:    Medications:    Consultations:     Discharge Concerns:    Estimated LOS:  Other:     Physician Treatment Plan for Primary Diagnosis: <principal problem not specified> Long Term Goal(s):   Short Term Goals: Ability to verbalize feelings will improve, Ability to maintain clinical measurements within normal limits will improve, Compliance with prescribed medications will improve, and Ability to identify triggers associated with substance abuse/mental health issues will improve  Physician Treatment Plan for Secondary Diagnosis: Active Problems:   Major depressive disorder, recurrent episode, severe, with psychosis ([place])  Long Term Goal(s): Improvement in symptoms so as ready for discharge  Short Term Goals: Ability to verbalize feelings will improve, Compliance with prescribed medications will improve, and Ability to identify triggers associated with substance abuse/mental health issues will improve  I certify that inpatient services furnished can reasonably be expected to improve the patient's condition.    Doyl Bitting 11/13/202210:55 AM

## 2021-11-10 NOTE — Progress Notes (Signed)
Patient is not receptive to staff, refused to answer risk assessment questions like SI/HI/AVH he forwards very little, minimal eye contact, isolated to the room throughout the evening shift refused evening scheduled medication will continue to monitor.

## 2021-11-11 DIAGNOSIS — F333 Major depressive disorder, recurrent, severe with psychotic symptoms: Secondary | ICD-10-CM | POA: Diagnosis not present

## 2021-11-11 MED ORDER — PANTOPRAZOLE SODIUM 40 MG PO TBEC: 80.0000 mg | DELAYED_RELEASE_TABLET | Freq: Every day | ORAL | 1 refills | Status: DC

## 2021-11-11 MED ORDER — DIVALPROEX SODIUM 500 MG PO DR TAB: 500.0000 mg | DELAYED_RELEASE_TABLET | Freq: Two times a day (BID) | ORAL | 1 refills | Status: DC

## 2021-11-11 MED ORDER — GABAPENTIN 300 MG PO CAPS
300.0000 mg | ORAL_CAPSULE | Freq: Three times a day (TID) | ORAL | 1 refills | Status: DC
Start: 2021-11-11 — End: 2022-02-12

## 2021-11-11 MED ORDER — HALOPERIDOL 2 MG PO TABS: 2.0000 mg | ORAL_TABLET | Freq: Two times a day (BID) | ORAL | 1 refills | Status: DC

## 2021-11-11 NOTE — Progress Notes (Signed)
  [facility] Adult Case Management Discharge Plan :  Will you be returning to the same living situation after discharge:  No. At discharge, do you have transportation home?: Yes,  pt reports he will arrange transportation himself. Do you have the ability to pay for your medications: Yes,  [facility]  Release of information consent forms completed and in the chart;  Patient's signature needed at discharge.  Patient to Follow up at:  Follow-up Information     [facility] Follow up.   Why: If needed, walk-in hours are Monday, Wednesday, and Friday, 8am-4pm. Contact information: [address] [place] [address] [phone]         [facility] Follow up.   Why: Another resource with walk-in hourse Monday through Friday, 9am-4pm. Contact information: [address] [place] [address] [phone]         [facility], [facility] Follow [facility].   Specialty: General Practice Contact information: [address] [place] [address] [phone]         [facility]   Specialty: General Practice Contact information: [address] [place] [address] [phone]         Center, [facility] .   Contact information: [address] [place] [address] [phone]                 Next level of care provider has access to [facility] Link:no  Safety Planning and Suicide Prevention discussed: Yes,  SPE completed with pt.     Has patient been referred to the Quitline?: Patient refused referral  Patient has been referred for addiction treatment: Pt. refused referral  Glenis Smoker, LCSW 11/11/2021, 3:41 PM

## 2021-11-11 NOTE — Plan of Care (Addendum)
Patient continues to refuse medications, refuse to talk to staff and interact appropriately with staff and peers. Remains isolated throughout the shift. No s/s of distress noted. Cont to monitor for safety.  Problem: Education: Goal: Knowledge of [place] [facility] Education information/materials will improve Outcome: Not Progressing Goal: Emotional status will improve Outcome: Not Progressing Goal: Mental status will improve Outcome: Not Progressing Goal: Verbalization of understanding the information provided will improve Outcome: Not Progressing   Problem: Coping: Goal: Ability to verbalize frustrations and anger appropriately will improve Outcome: Not Progressing Goal: Ability to demonstrate self-control will improve Outcome: Not Progressing   Problem: Physical Regulation: Goal: Ability to maintain clinical measurements within normal limits will improve Outcome: Not Progressing

## 2021-11-11 NOTE — BHH Suicide Risk Assessment (Signed)
[facility] Discharge Suicide Risk Assessment   Principal Problem: Major depressive disorder, recurrent episode, severe, with psychosis (HCC) Discharge Diagnoses: Principal Problem:   Major depressive disorder, recurrent episode, severe, with psychosis (HCC) Active Problems:   Posttraumatic stress disorder   Traumatic brain injury (HCC)   Cocaine dependence (HCC)   Amphetamine and psychostimulant-induced psychosis with hallucinations (HCC)   Total Time spent with patient: 45 minutes  Musculoskeletal: Strength & Muscle Tone: within normal limits Gait & Station: normal Patient leans: N/A  Psychiatric Specialty Exam  Presentation  General Appearance: Appropriate for Environment; Casual  Eye Contact:None  Speech:Slow  Speech Volume:Decreased  Handedness:Right   Mood and Affect  Mood:Dysphoric; Anxious; Irritable  Duration of Depression Symptoms: Greater than two weeks  Affect:Restricted   Thought Process  Thought Processes:Coherent  Descriptions of Associations:Circumstantial  Orientation:Full (Time, Place and Person)  Thought Content:Paranoid Ideation  History of Schizophrenia/Schizoaffective disorder:No  Duration of Psychotic Symptoms:Less than six months  Hallucinations:Hallucinations: Auditory  Ideas of Reference:Paranoia  Suicidal Thoughts:Suicidal Thoughts: No  Homicidal Thoughts:Homicidal Thoughts: No   Sensorium  Memory:Immediate Fair; Remote Fair  Judgment:Impaired  Insight:Poor   Executive Functions  Concentration:Fair  Attention Span:Fair  Recall:Fair  Fund of Knowledge:Fair  Language:Fair   Psychomotor Activity  Psychomotor Activity:Psychomotor Activity: Normal   Assets  Assets:Communication Skills; Desire for Improvement; Financial Resources/Insurance; Physical Health; Resilience; Social Support; Talents/Skills   Sleep  Sleep:No data recorded  Physical Exam: Physical Exam Vitals and nursing note reviewed.   Constitutional:      Appearance: Normal appearance.  HENT:     Head: Normocephalic and atraumatic.     Mouth/Throat:     Pharynx: Oropharynx is clear.  Eyes:     Pupils: Pupils are equal, round, and reactive to light.  Cardiovascular:     Rate and Rhythm: Normal rate and regular rhythm.  Pulmonary:     Effort: Pulmonary effort is normal.     Breath sounds: Normal breath sounds.  Abdominal:     General: Abdomen is flat.     Palpations: Abdomen is soft.  Musculoskeletal:        General: Normal range of motion.  Skin:    General: Skin is warm and dry.  Neurological:     General: No focal deficit present.     Mental Status: He is alert. Mental status is at baseline.  Psychiatric:        Attention and Perception: He is inattentive.        Mood and Affect: Mood normal. Affect is blunt.        Speech: Speech is delayed.        Behavior: Behavior is slowed.        Thought Content: Thought content normal. Thought content does not include suicidal ideation.        Cognition and Memory: Cognition normal.        Judgment: Judgment normal.   Review of Systems  Constitutional: Negative.   HENT: Negative.    Eyes: Negative.   Respiratory: Negative.    Cardiovascular: Negative.   Gastrointestinal: Negative.   Musculoskeletal: Negative.   Skin: Negative.   Neurological: Negative.   Psychiatric/Behavioral: Negative.    Blood pressure 116/74, pulse 76, temperature 97.9 F (36.6 C), temperature source Oral, resp. rate 18, height 5\' 11"  (1.803 m), weight 103.4 kg, SpO2 100 %. Body mass index is 31.8 kg/m.  Mental Status Per Nursing Assessment::   On Admission:  NA  Demographic Factors:  Male and Low socioeconomic status  Loss  Factors: Financial problems/change in socioeconomic status  Historical Factors: Impulsivity  Risk Reduction Factors:   NA  Continued Clinical Symptoms:  Depression:   Impulsivity Alcohol/Substance Abuse/Dependencies  Cognitive Features That  Contribute To Risk:  Closed-mindedness    Suicide Risk:  Minimal: No identifiable suicidal ideation.  Patients presenting with no risk factors but with morbid ruminations; may be classified as minimal risk based on the severity of the depressive symptoms   Follow-up Information     [facility] Follow up.   Why: If needed, walk-in hours are Monday, Wednesday, and Friday, 8am-4pm. Contact information: [address] [place] [address] [phone]         [facility] Follow up.   Why: Another resource with walk-in hourse Monday through Friday, 9am-4pm. Contact information: [place] [address] [phone]         [facility] [place] Follow up.   Specialty: General Practice Contact information: [place] [address] [place]: General Practice Contact information: [place] [id number] [place], [place] information: [address] [place] [address] [phone]                 Plan Of Care/Follow-up recommendations:  Activity as tolerated.  Regular diet.  [facility] [place].  Continue current medicine  Alethia Berthold, MD 11/11/2021, 3:49 PM
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
## 2021-11-11 NOTE — Group Note (Signed)
[facility] LCSW Group Therapy Note    Group Date: 11/11/2021 Start Time: 1300 End Time: 1400  Type of Therapy and Topic:  Group Therapy:  Overcoming Obstacles  Participation Level:  [facility] PARTICIPATION LEVEL: Did Not Attend  Mood:  Description of Group:   In this group patients will be encouraged to explore what they see as obstacles to their own wellness and recovery. They will be guided to discuss their thoughts, feelings, and behaviors related to these obstacles. The group will process together ways to cope with barriers, with attention given to specific choices patients can make. Each patient will be challenged to identify changes they are motivated to make in order to overcome their obstacles. This group will be process-oriented, with patients participating in exploration of their own experiences as well as giving and receiving support and challenge from other group members.  Therapeutic Goals: 1. Patient will identify personal and current obstacles as they relate to admission. 2. Patient will identify barriers that currently interfere with their wellness or overcoming obstacles.  3. Patient will identify feelings, thought process and behaviors related to these barriers. 4. Patient will identify two changes they are willing to make to overcome these obstacles:    Summary of Patient Progress   X   Therapeutic Modalities:   Cognitive Behavioral Therapy Solution Focused Therapy Motivational Interviewing Relapse Prevention Therapy   Harden Mo, LCSW

## 2021-11-11 NOTE — BH IP Treatment Plan (Addendum)
Interdisciplinary Treatment and Diagnostic Plan Update  11/11/2021 Time of Session: 9:00 AM Jeremy Frank MRN: [id number]  Principal Diagnosis: <principal problem not specified>  Secondary Diagnoses: Active Problems:   Major depressive disorder, recurrent episode, severe, with psychosis ([place])   Current Medications:  Current Facility-Administered Medications  Medication Dose Route Frequency Provider Last Rate Last Admin   acetaminophen (TYLENOL) tablet 650 mg  650 mg Oral Q6H PRN Patrecia Pour, NP       alum & mag hydroxide-simeth (MAALOX/MYLANTA) 200-200-20 MG/5ML suspension 30 mL  30 mL Oral Q4H PRN Patrecia Pour, NP       diphenhydrAMINE (BENADRYL) injection 50 mg  50 mg Intramuscular Q6H PRN Patrecia Pour, NP       divalproex (DEPAKOTE) DR tablet 500 mg  500 mg Oral Q12H Lord, Asa Saunas, NP       gabapentin (NEURONTIN) capsule 300 mg  300 mg Oral TID Patrecia Pour, NP       haloperidol (HALDOL) tablet 2 mg  2 mg Oral BID Patrecia Pour, NP       haloperidol lactate (HALDOL) injection 5 mg  5 mg Intramuscular Q6H PRN Patrecia Pour, NP       And   LORazepam (ATIVAN) injection 2 mg  2 mg Intramuscular Q6H PRN Patrecia Pour, NP       magnesium hydroxide (MILK OF MAGNESIA) suspension 30 mL  30 mL Oral Daily PRN Patrecia Pour, NP       pantoprazole (PROTONIX) EC tablet 80 mg  80 mg Oral Daily Patrecia Pour, NP       PTA Medications: Medications Prior to Admission  Medication Sig Dispense Refill Last Dose   divalproex (DEPAKOTE) 500 MG DR tablet Take 1 tablet (500 mg total) by mouth every 12 (twelve) hours. (Patient not taking: No sig reported) 60 tablet 1    gabapentin (NEURONTIN) 100 MG capsule Take 1 capsule (100 mg total) by mouth 3 (three) times daily. (Patient not taking: No sig reported) 90 capsule 1    omeprazole (PRILOSEC) 40 MG capsule Take 40 mg by mouth daily. (Patient not taking: No sig reported)       Patient Stressors: Medication change or  noncompliance   Substance abuse    Patient Strengths: Other: Patient has poor insight  Treatment Modalities: Medication Management, Group therapy, Case management,  1 to 1 session with clinician, Psychoeducation, Recreational therapy.   Physician Treatment Plan for Primary Diagnosis: <principal problem not specified> Long Term Goal(s): Improvement in symptoms so as ready for discharge   Short Term Goals: Ability to verbalize feelings will improve Compliance with prescribed medications will improve Ability to identify triggers associated with substance abuse/mental health issues will improve Ability to maintain clinical measurements within normal limits will improve  Medication Management: Evaluate patient's response, side effects, and tolerance of medication regimen.  Therapeutic Interventions: 1 to 1 sessions, Unit Group sessions and Medication administration.  Evaluation of Outcomes: Not Met  Physician Treatment Plan for Secondary Diagnosis: Active Problems:   Major depressive disorder, recurrent episode, severe, with psychosis ([place])  Long Term Goal(s): Improvement in symptoms so as ready for discharge   Short Term Goals: Ability to verbalize feelings will improve Compliance with prescribed medications will improve Ability to identify triggers associated with substance abuse/mental health issues will improve Ability to maintain clinical measurements within normal limits will improve     Medication Management: Evaluate patient's response, side effects, and tolerance of medication regimen.  Therapeutic Interventions: 1 to 1 sessions, Unit Group sessions and Medication administration.  Evaluation of Outcomes: Not Met   RN Treatment Plan for Primary Diagnosis: <principal problem not specified> Long Term Goal(s): Knowledge of disease and therapeutic regimen to maintain health will improve  Short Term Goals: Ability to remain free from injury will improve, Ability to verbalize  frustration and anger appropriately will improve, Ability to demonstrate self-control, Ability to verbalize feelings will improve, Ability to identify and develop effective coping behaviors will improve, and Compliance with prescribed medications will improve  Medication Management: RN will administer medications as ordered by provider, will assess and evaluate patient's response and provide education to patient for prescribed medication. RN will report any adverse and/or side effects to prescribing provider.  Therapeutic Interventions: 1 on 1 counseling sessions, Psychoeducation, Medication administration, Evaluate responses to treatment, Monitor vital signs and CBGs as ordered, Perform/monitor CIWA, COWS, AIMS and Fall Risk screenings as ordered, Perform wound care treatments as ordered.  Evaluation of Outcomes: Not Met   LCSW Treatment Plan for Primary Diagnosis: <principal problem not specified> Long Term Goal(s): Safe transition to appropriate next level of care at discharge, Engage patient in therapeutic group addressing interpersonal concerns.  Short Term Goals: Engage patient in aftercare planning with referrals and resources, Increase social support, Increase ability to appropriately verbalize feelings, Increase emotional regulation, Facilitate acceptance of mental health diagnosis and concerns, Facilitate patient progression through stages of change regarding substance use diagnoses and concerns, Identify triggers associated with mental health/substance abuse issues, and Increase skills for wellness and recovery  Therapeutic Interventions: Assess for all discharge needs, 1 to 1 time with Social worker, Explore available resources and support systems, Assess for adequacy in community support network, Educate family and significant other(s) on suicide prevention, Complete Psychosocial Assessment, Interpersonal group therapy.  Evaluation of Outcomes: Not Met   Progress in  Treatment: Attending groups: No. Participating in groups: No. Taking medication as prescribed: Yes. Toleration medication: Yes. Family/Significant other contact made: No, will contact:  when given permission. Patient understands diagnosis: Yes. Discussing patient identified problems/goals with staff: No. Medical problems stabilized or resolved: Yes. Denies suicidal/homicidal ideation: Yes. Issues/concerns per patient self-inventory: No. Other: none.  New problem(s) identified: No, Describe:  none.  New Short Term/Long Term Goal(s): detox, elimination of symptoms of psychosis, medication management for mood stabilization; elimination of SI thoughts; development of comprehensive mental wellness/sobriety plan.  Patient Goals: "My goal is to leave."   Discharge Plan or Barriers: CSW will assist pt with development of an appropriate aftercare/discharge plan.  Reason for Continuation of Hospitalization: Aggression Depression Medication stabilization  Estimated Length of Stay: 1-7 days   Scribe for Treatment Team: Shirl Harris, LCSW 11/11/2021 10:19 AM

## 2021-11-11 NOTE — Progress Notes (Signed)
Patient remains sleep this am. Refused to get up for meals and medications.

## 2021-11-11 NOTE — Progress Notes (Signed)
Patient awake and sitting in the day room. Approached by RN and asked if he was going to take his medication for the day. Patient refused to speak to nurse.   RN made second attempt to communicate with patient to perform high risk assessment and ask if he was going to take medications.   Patient initially refused to response and then stated, "Take medications for what? So it can cover up the way I am  feeling? So it can make me feel worse than I'm already feeling? No I'm not taking no meds."

## 2021-11-11 NOTE — Discharge Summary (Signed)
Physician Discharge Summary Note  Patient:  Jeremy Frank is an 35 y.o., male MRN:  [id number] DOB:  31-Dec-1985 Patient phone:  [phone] (home)  Patient address:   [address] [place] [address],  Total Time spent with patient: 45 minutes  Date of Admission:  11/09/2021 Date of Discharge: 11/11/2021  Reason for Admission: Admitted after presenting with dysphoria confusion and substance abuse some possible suicidal ideation  Principal Problem: Major depressive disorder, recurrent episode, severe, with psychosis (HCC) Discharge Diagnoses: Principal Problem:   Major depressive disorder, recurrent episode, severe, with psychosis (HCC) Active Problems:   Posttraumatic stress disorder   Traumatic brain injury (HCC)   Cocaine dependence (HCC)   Amphetamine and psychostimulant-induced psychosis with hallucinations (HCC)   Past Psychiatric History: History of mood instability with substance-induced psychosis on top of depression PTSD head injury  Past Medical History:  Past Medical History:  Diagnosis Date   Drug dependence (HCC)    GI bleed    Hiatal hernia    Hypertension    PTSD (post-traumatic stress disorder)     Past Surgical History:  Procedure Laterality Date   KNEE SURGERY     Family History: History reviewed. No pertinent family history. Family Psychiatric  History: See previous Social History:  Social History   Substance and Sexual Activity  Alcohol Use Yes   Alcohol/week: 42.0 standard drinks   Types: 42 Cans of beer per week   Comment: per pt he drinks 6-12 cans of beer daily     Social History   Substance and Sexual Activity  Drug Use Yes   Types: Cocaine, Marijuana    Social History   Socioeconomic History   Marital status: Single    Spouse name: Not on file   Number of children: Not on file   Years of education: Not on file   Highest education level: Not on file  Occupational History   Not on file  Tobacco Use   Smoking status:  Every Day    Packs/day: 0.25    Types: Cigarettes   Smokeless tobacco: Never  Vaping Use   Vaping Use: Never used  Substance and Sexual Activity   Alcohol use: Yes    Alcohol/week: 42.0 standard drinks    Types: 42 Cans of beer per week    Comment: per pt he drinks 6-12 cans of beer daily   Drug use: Yes    Types: Cocaine, Marijuana   Sexual activity: Not Currently  Other Topics Concern   Not on file  Social History Narrative   Not on file   Social Determinants of Health   Financial Resource Strain: Not on file  Food Insecurity: Not on file  Transportation Needs: Not on file  Physical Activity: Not on file  Stress: Not on file  Social Connections: Not on file    Hospital Course: Admitted to psychiatric unit.  Continued on 15-minute checks.  Patient showed no dangerous or aggressive behavior in the hospital.  Denied suicidal ideation.  Patient was prescribed medication consistent with what he had taken in the past but was only minimally cooperative with treatment.  He repeatedly stated he had no suicidal thoughts and denied psychotic symptoms and asked for discharge.  At this point does not appear to meet commitment criteria.  Supportive counseling and review of treatment plan.  Prescriptions will still be provided at discharge.  Patient will be discharged from the hospital to outpatient treatment at his request  Physical Findings: AIMS:  , ,  ,  ,  CIWA:    COWS:     Musculoskeletal: Strength & Muscle Tone: within normal limits Gait & Station: normal Patient leans: N/A   Psychiatric Specialty Exam:  Presentation  General Appearance: Appropriate for Environment; Casual  Eye Contact:None  Speech:Slow  Speech Volume:Decreased  Handedness:Right   Mood and Affect  Mood:Dysphoric; Anxious; Irritable  Affect:Restricted   Thought Process  Thought Processes:Coherent  Descriptions of Associations:Circumstantial  Orientation:Full (Time, Place and  Person)  Thought Content:Paranoid Ideation  History of Schizophrenia/Schizoaffective disorder:No  Duration of Psychotic Symptoms:Less than six months  Hallucinations:Hallucinations: Auditory  Ideas of Reference:Paranoia  Suicidal Thoughts:Suicidal Thoughts: No  Homicidal Thoughts:Homicidal Thoughts: No   Sensorium  Memory:Immediate Fair; Remote Fair  Judgment:Impaired  Insight:Poor   Executive Functions  Concentration:Fair  Attention Span:Fair  [place]   Psychomotor Activity  Psychomotor Activity:Psychomotor Activity: Normal   Assets  Assets:Communication Skills; Desire for Improvement; Financial Resources/Insurance; Physical Health; Resilience; Social Support; Talents/Skills   Sleep  Sleep:No data recorded   Physical Exam: Physical Exam Vitals and nursing note reviewed.  Constitutional:      Appearance: Normal appearance.  HENT:     Head: Normocephalic and atraumatic.     Mouth/Throat:     Pharynx: Oropharynx is clear.  Eyes:     Pupils: Pupils are equal, round, and reactive to light.  Cardiovascular:     Rate and Rhythm: Normal rate and regular rhythm.  Pulmonary:     Effort: Pulmonary effort is normal.     Breath sounds: Normal breath sounds.  Abdominal:     General: Abdomen is flat.     Palpations: Abdomen is soft.  Musculoskeletal:        General: Normal range of motion.  Skin:    General: Skin is warm and dry.  Neurological:     General: No focal deficit present.     Mental Status: He is alert. Mental status is at baseline.  Psychiatric:        Attention and Perception: He is inattentive.        Mood and Affect: Mood normal. Affect is blunt.        Speech: He is noncommunicative.        Behavior: Behavior is cooperative.        Thought Content: Thought content normal. Thought content does not include suicidal ideation.        Cognition and Memory: Cognition normal.        Judgment:  Judgment is impulsive.   Review of Systems  Constitutional: Negative.   HENT: Negative.    Eyes: Negative.   Respiratory: Negative.    Cardiovascular: Negative.   Gastrointestinal: Negative.   Musculoskeletal: Negative.   Skin: Negative.   Neurological: Negative.   Psychiatric/Behavioral:  Negative for depression, hallucinations, substance abuse and suicidal ideas. The patient is not nervous/anxious.   Blood pressure 116/74, pulse 76, temperature 97.9 F (36.6 C), temperature source Oral, resp. rate 18, height 5\' 11"  (1.803 m), weight 103.4 kg, SpO2 100 %. Body mass index is 31.8 kg/m.   Social History   Tobacco Use  Smoking Status Every Day   Packs/day: 0.25   Types: Cigarettes  Smokeless Tobacco Never   Tobacco Cessation:  A prescription for an FDA-approved tobacco cessation medication was offered at discharge and the patient refused   Blood Alcohol level:  Lab Results  Component Value Date   [facility] <10 11/08/2021   ETH <10 [id number]    Metabolic Disorder Labs:  Lab  Results  Component Value Date   HGBA1C 5.8 (H) 06/19/2021   MPG 119.76 06/19/2021   No results found for: PROLACTIN Lab Results  Component Value Date   CHOL 145 06/19/2021   TRIG 63 06/19/2021   HDL 60 06/19/2021   CHOLHDL 2.4 06/19/2021   VLDL 13 06/19/2021   LDLCALC 72 06/19/2021    See Psychiatric Specialty Exam and Suicide Risk Assessment completed by Attending Physician prior to discharge.  Discharge destination:  Home  Is patient on multiple antipsychotic therapies at discharge:  No   Has Patient had three or more failed trials of antipsychotic monotherapy by history:  No  Recommended Plan for Multiple Antipsychotic Therapies: NA  Discharge Instructions     Diet - low sodium heart healthy   Complete by: As directed    Increase activity slowly   Complete by: As directed       Allergies as of 11/11/2021       Reactions   Peanut-containing Drug Products Diarrhea, Anaphylaxis,  Swelling   Cheese Diarrhea   Eggs Or Egg-derived Products Diarrhea        Medication List     STOP taking these medications    omeprazole 40 MG capsule Commonly known as: PRILOSEC Replaced by: pantoprazole 40 MG tablet       TAKE these medications      Indication  divalproex 500 MG DR tablet Commonly known as: DEPAKOTE Take 1 tablet (500 mg total) by mouth every 12 (twelve) hours.  Indication: Depressive Phase of Manic-Depression   gabapentin 300 MG capsule Commonly known as: NEURONTIN Take 1 capsule (300 mg total) by mouth 3 (three) times daily. What changed:  medication strength how much to take  Indication: Abuse or Misuse of Alcohol   haloperidol 2 MG tablet Commonly known as: HALDOL Take 1 tablet (2 mg total) by mouth 2 (two) times daily.  Indication: MIXED BIPOLAR AFFECTIVE DISORDER   pantoprazole 40 MG tablet Commonly known as: PROTONIX Take 2 tablets (80 mg total) by mouth daily. Replaces: omeprazole 40 MG capsule  Indication: Gastroesophageal Reflux Disease        Follow-up Information     [facility] Follow up.   Why: If needed, walk-in hours are Monday, Wednesday, and Friday, 8am-4pm. Contact information: [address] [place] [address] [phone]         [facility] Follow up.   Why: Another resource with walk-in hourse Monday through Friday, 9am-4pm. Contact information: [place] [address] [phone]         [facility] Follow up.   Specialty: General Practice Contact information: [place] [address] [place]: General Practice Contact information: [place] [address] [place], [place] information: [address] [place] [address] [phone]                 Follow-up recommendations:  Activity as tolerated.  Regular diet.  Continue current medicine.  Engage in outpatient substance abuse and mental health treatment especially through the [place].  Comments: Prescriptions provided  Signed: Alethia Berthold, MD 11/11/2021, 3:53 PM

## 2021-11-11 NOTE — Progress Notes (Signed)
Patient alert and oriented x 2 with periods of confusion to time and situation, he was in bed most of the evening shift, selective mutism at times. He appears labile and tense, unwilling to participate in treatment plan, dismissive of staff, does not make eye contact. He refused to answer risk assessment questions, he also refused scheduled evening medication. No distress noted at this time, 15 minutes safety checks maintained will continue to closely monitor.

## 2021-11-11 NOTE — [facility] Suicide Risk Assessment (Signed)
[facility] INPATIENT:  Family/Significant Other Suicide Prevention Education  Suicide Prevention Education:  Patient Refusal for Family/Significant Other Suicide Prevention Education: The patient Jeremy Frank has refused to provide written consent for family/significant other to be provided Family/Significant Other Suicide Prevention Education during admission and/or prior to discharge.  Physician notified.  SPE completed with pt, as pt refused to consent to family contact. SPI pamphlet provided to pt and pt was encouraged to share information with support network, ask questions, and talk about any concerns relating to SPE. Pt denies access to guns/firearms and verbalized understanding of information provided. Mobile Crisis information also provided to pt.  Glenis Smoker 11/11/2021, 3:36 PM

## 2021-11-11 NOTE — [facility] Counselor (Signed)
Adult Comprehensive Assessment  Patient ID: Jeremy Frank, male   DOB: 1986-07-08, 35 y.o.   MRN: [id number]  Information Source: Information source: (P) Patient (Previous PSA from enounter 06/17/21)  Current Stressors:  Patient states their primary concerns and needs for treatment are:: (P) Patient states that he did not want to come here and declined the assessment. Patient states their goals for this hospitilization and ongoing recovery are:: (P) "My goal is to leave."  Living/Environment/Situation:     Family History:     Childhood History:     Education:     Employment/Work Situation:      Architect:      Alcohol/Substance Abuse:      Social Support System:      Leisure/Recreation:      Strengths/Needs:      Discharge Plan:      Summary/Recommendations:   Emergency planning/management officer and Recommendations (to be completed by the evaluator): Patient is a 35 year old [place] [facility] [name] [name] He states that he did not want to come here and expressed desire to leave. Previously noted that he reported he receives Service Connected Disability. CSW unable to update information from previous encounter. Pt declined to participate and was guarded, focused on how "no one wants to help him". He has a primary diagnosis of Severe recurrent major depression with psychotic features. History of cocaine, alcohol, and marijuana use noted per previous PSA. Pt reports that he is homeless and declined interest in aftercare when speaking with CSW regarding discharge. Recommendations include: crisis stabilization, therapeutic milieu, encourage group attendance and participation, medication management for mood stabilization and development of comprehensive mental wellness and sobriety plan. He would benefit from continue mental health treatment in the community and [facility], however, pt declines this.  Jeremy Frank. 11/11/2021

## 2021-11-30 ENCOUNTER — Emergency Department: Payer: No Typology Code available for payment source

## 2021-11-30 ENCOUNTER — Other Ambulatory Visit: Payer: Self-pay

## 2021-11-30 ENCOUNTER — Emergency Department
Admission: EM | Admit: 2021-11-30 | Discharge: 2021-12-01 | Disposition: A | Discharge status: Home / Self Care | Payer: No Typology Code available for payment source | Attending: Emergency Medicine | Admitting: Emergency Medicine

## 2021-11-30 DIAGNOSIS — Z91010 Allergy to peanuts: Secondary | ICD-10-CM | POA: Insufficient documentation

## 2021-11-30 DIAGNOSIS — R0789 Other chest pain: Secondary | ICD-10-CM | POA: Insufficient documentation

## 2021-11-30 DIAGNOSIS — F17210 Nicotine dependence, cigarettes, uncomplicated: Secondary | ICD-10-CM | POA: Insufficient documentation

## 2021-11-30 DIAGNOSIS — R000 Tachycardia, unspecified: Secondary | ICD-10-CM | POA: Diagnosis not present

## 2021-11-30 DIAGNOSIS — I10 Essential (primary) hypertension: Secondary | ICD-10-CM | POA: Diagnosis not present

## 2021-11-30 LAB — BASIC METABOLIC PANEL
Anion gap: 12 (ref 5–15)
BUN: 25 mg/dL — ABNORMAL HIGH (ref 6–20)
CO2: 22 mmol/L (ref 22–32)
Calcium: 9.6 mg/dL (ref 8.9–10.3)
Chloride: 100 mmol/L (ref 98–111)
Creatinine, Ser: 1.8 mg/dL — ABNORMAL HIGH (ref 0.61–1.24)
GFR, Estimated: 49 mL/min — ABNORMAL LOW (ref >60)
Glucose, Bld: 122 mg/dL — ABNORMAL HIGH (ref 70–99)
Potassium: 3.4 mmol/L — ABNORMAL LOW (ref 3.5–5.1)
Sodium: 134 mmol/L — ABNORMAL LOW (ref 135–145)

## 2021-11-30 LAB — CBC
HCT: 42.3 % (ref 39.0–52.0)
Hemoglobin: 14.6 g/dL (ref 13.0–17.0)
MCH: 30.2 pg (ref 26.0–34.0)
MCHC: 34.5 g/dL (ref 30.0–36.0)
MCV: 87.6 fL (ref 80.0–100.0)
Platelets: 230 10*3/uL (ref 150–400)
RBC: 4.83 MIL/uL (ref 4.22–5.81)
RDW: 13.6 % (ref 11.5–15.5)
WBC: 12.1 10*3/uL — ABNORMAL HIGH (ref 4.0–10.5)
nRBC: 0 % (ref 0.0–0.2)

## 2021-11-30 LAB — TROPONIN I (HIGH SENSITIVITY): Troponin I (High Sensitivity): 10 ng/L (ref <18)

## 2021-11-30 MED ORDER — SODIUM CHLORIDE 0.9 % IV SOLN
1000.0000 mL | Freq: Once | INTRAVENOUS | Status: AC
  Administered 2021-11-30: 1000 mL via INTRAVENOUS

## 2021-11-30 NOTE — ED Triage Notes (Signed)
Pt presents to ER c/o chest pain which pt states is d/t "severe dehydratioon.  Pt states he has been doing drugs but will not admit which drugs he has been doing. Pt restless in triage at this time, and cant seem to give straight answers.  Pt cannot describe chest pain, how long it has been going on for, or where it hurts in his chest.

## 2021-11-30 NOTE — ED Provider Notes (Signed)
[facility] Emergency Department Provider Note   ____________________________________________    I have reviewed the triage vital signs and the nursing notes.   HISTORY  Chief Complaint Chest Pain     HPI Jeremy Frank is a 35 y.o. male with history of substance abuse who presents with complaints of chest discomfort and feeling of dehydration.  Patient reports that he has been doing drugs and feels that he needs fluids because he is dehydrated.  He reports he had chest discomfort earlier today, now resolved.  He reports he is not here for "mental issues"  Past Medical History:  Diagnosis Date   Drug dependence ([place])    GI bleed    Hiatal hernia    Hypertension    PTSD (post-traumatic stress disorder)     Patient Active Problem List   Diagnosis Date Noted   Cannabis dependence, continuous ([place]) 06/18/2021   Alcohol abuse with alcohol-induced mood disorder ([place]) 06/18/2021   Crohn's disease ([place]) 06/18/2021   Nicotine dependence 06/18/2021   Other personal history presenting hazards to health 06/18/2021   Peptic ulcer 06/18/2021   Severe alcohol dependence ([place]) 06/18/2021   Major depressive disorder, recurrent episode, severe, with psychosis ([place]) 06/16/2021   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 Authorizing Provider  divalproex (DEPAKOTE) 500 MG DR tablet Take 1 tablet (500 mg total) by mouth every 12 (twelve) hours. 11/11/21   Clapacs, Madie Reno, MD  gabapentin (NEURONTIN) 300 MG capsule Take 1 capsule (300 mg total) by mouth 3  (three) times daily. 11/11/21   Clapacs, Madie Reno, MD  haloperidol (HALDOL) 2 MG tablet Take 1 tablet (2 mg total) by mouth 2 (two) times daily. 11/11/21   Clapacs, Madie Reno, MD  pantoprazole (PROTONIX) 40 MG tablet Take 2 tablets (80 mg total) by mouth daily. 11/11/21   Clapacs, Madie Reno, MD     Allergies Peanut-containing drug products, Cheese, and Eggs or egg-derived products  History reviewed. No pertinent family history.  Social History Social History   Tobacco Use   Smoking status: Every Day    Packs/day: 0.25    Types: Cigarettes   Smokeless tobacco: Never  Vaping Use   Vaping Use: Never used  Substance Use Topics   Alcohol use: Yes    Alcohol/week: 42.0 standard drinks    Types: 42 Cans of beer per week    Comment: per pt he drinks 6-12 cans of beer daily   Drug use: Yes    Types: Cocaine, Marijuana    Review of Systems  Constitutional: No fever/chills  Cardiovascular: As above Respiratory: Denies shortness of breath. Gastrointestinal: No abdominal pain.  No nausea, no vomiting.    Musculoskeletal: Negative for back pain.  Neurological: Negative for headaches or weakness   ____________________________________________   PHYSICAL EXAM:  VITAL SIGNS: ED Triage Vitals [11/30/21 2023]  Enc Vitals Group     BP (!) 162/115     Pulse Rate (!) 130     Resp 20     Temp 99.6 F (37.6 C)     Temp Source Oral  SpO2 98 %     Weight      Height      Head Circumference      Peak Flow      Pain Score      Pain Loc      Pain Edu?      Excl. in GC?     Constitutional: Alert and oriented.  Anxious Eyes: Conjunctivae are normal.   Nose: No congestion/rhinnorhea. Mouth/Throat: Mucous membranes are moist.    Cardiovascular: Tachycardia, regular rhythm. Grossly normal heart sounds.  Good peripheral circulation. Respiratory: Normal respiratory effort.  No retractions. Lungs CTAB. Gastrointestinal: Soft and nontender. No distention.  No CVA  tenderness.  Musculoskeletal:   Warm and well perfused Neurologic:  Normal speech and language. No gross focal neurologic deficits are appreciated.  Skin:  Skin is warm, dry and intact. No rash noted. Psychiatric: Patient is slightly paranoid however that appears to be typical for him, he is not acutely psychotic and is appropriately oriented, no SI or HI  ____________________________________________   LABS (all labs ordered are listed, but only abnormal results are displayed)  Labs Reviewed  BASIC METABOLIC PANEL - Abnormal; Notable for the following components:      Result Value   Sodium 134 (*)    Potassium 3.4 (*)    Glucose, Bld 122 (*)    BUN 25 (*)    Creatinine, Ser 1.80 (*)    GFR, Estimated 49 (*)    All other components within normal limits  CBC - Abnormal; Notable for the following components:   WBC 12.1 (*)    All other components within normal limits  RESP PANEL BY RT-PCR (FLU A&B, COVID) ARPGX2  URINE DRUG SCREEN, QUALITATIVE ([facility] ONLY)  TROPONIN I (HIGH SENSITIVITY)   ____________________________________________  EKG  ED ECG REPORT I, Jene Every, the attending physician, personally viewed and interpreted this ECG.  Date: 11/30/2021  Rhythm: Sinus tachycardia QRS Axis: normal Intervals: normal ST/T Wave abnormalities: normal Narrative Interpretation: no evidence of acute ischemia  ____________________________________________  RADIOLOGY  Patient refusing chest x-ray ____________________________________________   PROCEDURES  Procedure(s) performed: No  Procedures   Critical Care performed: No ____________________________________________   INITIAL IMPRESSION / ASSESSMENT AND PLAN / ED COURSE  Pertinent labs & imaging results that were available during my care of the patient were reviewed by me and considered in my medical decision making (see chart for details).   Patient with history of substance abuse presents with complaints of  chest discomfort earlier today now asymptomatic.  Does state that he feels dehydrated.  He is tachycardic upon arrival, unclear whether this may be related to dehydration or substance abuse.  Will treat with IV fluids, check EKG, troponin, labs and chest x-ray  Patient felt better after IV fluids, his EKG is reassuring, high sensitive troponin is normal.  Not consistent with ACS PE or dissection  Feel the patient is appropriate for discharge however we have no way of getting the patient home at this time, his mother is unable to come get him/his not answering the phone      ____________________________________________   FINAL CLINICAL IMPRESSION(S) / ED DIAGNOSES  Final diagnoses:  Atypical chest pain        Note:  This document was prepared using Dragon voice recognition software and may include unintentional dictation errors.    Jene Every, MD 11/30/21 [phone]

## 2021-11-30 NOTE — ED Notes (Signed)
Patient standing and stating that he is afraid of the IV fluids and concerned that he might die.  Patient reassured and calmed.  Patient now sitting and not attempting to pull out IV.

## 2021-11-30 NOTE — ED Notes (Signed)
Patient pulled IV out, stating that felt like there was a bulge in vein.  Several people attempting to explain to patient that pain he is feeling is because he pulled out his IV.  Attempted to reassure patient that he was going to be ok but patient continues to ask if he is going to be ok.

## 2021-11-30 NOTE — ED Notes (Signed)
Patient very guarded and tense.  Reports that he has had some chest pain and thinks he is dehydrated.  IV initiated with fluid bolus, unable to obtain blood specimens.  Lab called to come draw specimens.

## 2021-11-30 NOTE — ED Notes (Signed)
Ice pack placed on right arm for comfort.

## 2021-11-30 NOTE — ED Notes (Signed)
Attempted to call patient's mother at patient request, but no answer at number listed in computer.

## 2021-11-30 NOTE — ED Notes (Signed)
Patient resting quietly with eyes closed at this time.

## 2021-11-30 NOTE — ED Notes (Signed)
IV tubing changed to to leaking fluid.  Security officer who has been talking with patient reports that patient had been biting the tubing prior to it leaking.

## 2021-12-01 NOTE — BH Assessment (Signed)
This writer attempted to contact pt's mother Jeremy Frank, Jeremy Frank (Mother) [phone]  however there was no answer. TTS to follow up.

## 2021-12-01 NOTE — BH Assessment (Signed)
Comprehensive Clinical Assessment (CCA) Note  12/01/2021 HOSS PALERMO AT:[id number] Recommendations for Services/Supports/Treatments: Patient recommended for continued observation and discharge when medically sober.   Jeremy Frank is a 35 year old, English speaking, black male with a psych hx of MDD, psychosis, and PTSD. Additionally, pt has a hx of TBI and polysubstance abuse. Pt presented to [facility] ED via mother with complaints of chest pain and dehydration. On assessment, pt. presented with bizarre and disorganized behavior. Pt had restless psychomotor activity. Pt had soft and barely intelligible speech. Pt began rambling about experiencing pain in his forearm from his IV injection site. Pt was preoccupied with several somatic complaints such as chest pain and ingesting poisonous chemicals throughout the assessment. Pt admitted to using unknown amounts of multiple substances prior to arrival including meth, marijuana, cocaine, and alcohol. Pt was paranoid and seemed to have thought blocking. Pt had difficulty concentrating and his answer were irrelevant to questions asked. Pt had poor reality testing. Pt had no insight and his judgement was impaired. Pt SI/HI/AV/H. Pt avoided all eye contact.    Chief Complaint:  Chief Complaint  Patient presents with   Chest Pain   Visit Diagnosis: Psychosis    CCA Screening, Triage and Referral (STR)  Patient Reported Information How did you hear about [place]? Other (Comment)  Referral name: No data recorded Referral phone number: No data recorded  Whom do you see for routine medical problems? 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 Pt presented to the ED for c/o dehydration and polysubstance abuse.  How Long Has This Been Causing You Problems? <Week  What Do You Feel Would Help You the Most Today? -- (IV Fluids)   Have You Recently Been in Any Inpatient Treatment (Hospital/Detox/Crisis Center/28-Day Program)? Yes  [facility] [place] [place]  How Long Were You There? 2 weeks  When Were You Discharged? No data recorded  Have You Ever Received Services From [facility] Before? No data recorded Who Do You See at [facility]? No data recorded  Have You Recently Had Any Thoughts About Hurting Yourself? No  Are You Planning to Commit Suicide/Harm Yourself At This time? No   Have you Recently Had Thoughts About [place]? No  Explanation: No data recorded  Have You Used Any Alcohol or Drugs in the Past 24 Hours? Yes  How Long Ago Did You Use Drugs or Alcohol? No data recorded What Did You Use and How Much? Methamphetamine, cocaine, cannabis and alcohol; unknown amounts   Do You Currently Have a Therapist/Psychiatrist? Yes  Name of Therapist/Psychiatrist: [facility] Recently Discharged From Any Mudlogger or Programs? No  Explanation of Discharge From Practice/Program: No data recorded    CCA Screening Triage Referral Assessment Type of Contact: Face-to-Face  Is this Initial or Reassessment? No data recorded Date Telepsych consult ordered in [facility]:  No data recorded Time Telepsych consult ordered in [facility]:  No data recorded  Patient Reported Information Reviewed? Yes  Patient Left Without Being Seen? No data recorded Reason for Not Completing Assessment: No data recorded  Collateral Involvement: None provided   Does Patient Have a  [place]? No data recorded Name and Contact of Legal Guardian: No data recorded If Minor and Not Living with Parent(s), Who has Custody?  n/a  Is CPS involved or ever been involved? Never  Is APS involved or ever been involved? Never   Patient Determined To Be At Risk for Harm To Self or Others Based on Review of Patient Reported Information or Presenting Complaint? No  Method: No data recorded Availability of Means: No data recorded Intent: No data recorded Notification Required: No data recorded Additional Information for Danger to Others Potential: No data recorded Additional Comments for Danger to Others Potential: No data recorded Are There Guns or Other Weapons in Your Home? No data recorded Types of Guns/Weapons: No data recorded Are These Weapons Safely Secured?                            No data recorded Who Could Verify You Are Able To Have These Secured: No data recorded Do You Have any Outstanding Charges, Pending Court Dates, Parole/Probation? No data recorded Contacted To Inform of Risk of Harm To Self or Others: No data recorded  [facility] of Assessment[facility] ED   Does Patient Present under Involuntary Commitment? No  IVC Papers Initial File Date: 11/09/21   [place] of Residence: [place]   Patient Currently Receiving the Following Services: Medication Management   Determination of Need: Emergent (2 hours)   Options For Referral: Therapeutic Triage Services     CCA Biopsychosocial Intake/Chief Complaint:  Patient reports he hallucinating and has not taken his psych medications in 10 days. Patient states he "seld-medicates" with alcohol and cocaine.  Current Symptoms/Problems: Visual and auditory hallucinations, lack of sleep, binge eating   Patient Reported Schizophrenia/Schizoaffective Diagnosis in Past: No   Strengths: Pt has a support system  Preferences: Unk  Abilities: Unk   Type of Services Patient Feels are Needed: medication management   Initial Clinical Notes/Concerns: None   Mental Health Symptoms Depression:   None   Duration of Depressive symptoms:  N/A    Mania:   None   Anxiety:    Restlessness; Worrying; Difficulty concentrating   Psychosis:   Grossly disorganized or catatonic behavior; Grossly disorganized speech   Duration of Psychotic symptoms:  Less than six months   Trauma:   N/A   Obsessions:   Intrusive/time consuming; Recurrent & persistent thoughts/impulses/images; Cause anxiety   Compulsions:   Intended to reduce stress or prevent another outcome; Poor Insight; Disrupts with routine/functioning; Absent insight/delusional   Inattention:   N/A   Hyperactivity/Impulsivity:   N/A   Oppositional/Defiant Behaviors:   None   Emotional Irregularity:   None   Other Mood/Personality Symptoms:  No data recorded   Mental Status Exam Appearance and self-care  Stature:   Average   Weight:   Average weight   Clothing:   Age-appropriate   Grooming:   Normal   Cosmetic use:   None   Posture/gait:   Normal   Motor activity:   Restless   Sensorium  Attention:   Distractible; Inattentive   Concentration:   Anxiety interferes; Focuses on irrelevancies; Preoccupied   Orientation:   X5   Recall/memory:   Normal   Affect and Mood  Affect:   Appropriate; Depressed; Full Range   Mood:   Anxious   Relating  Eye contact:   None   Facial expression:   Anxious   Attitude toward examiner:   Guarded;  Suspicious; Cooperative   Thought and Language  Speech flow:  Soft   Thought content:   Delusions; Suspicious   Preoccupation:   Somatic   Hallucinations:   Auditory   Organization:  No data recorded  [employer] of Knowledge:   Fair   Intelligence:   Average   Abstraction:   Overly abstract   Judgement:   Poor   Reality Testing:   Distorted   Insight:   Poor   Decision Making:   Impulsive   Social Functioning  Social Maturity:   Irresponsible; Impulsive   Social Judgement:   "Chief of Staff"; Heedless   Stress  Stressors:   Relationship;  Financial; Armed forces operational officer; Work; Transitions   Coping Ability:   Exhausted   Skill Deficits:   Scientist, physiological; Self-control; Self-care   Supports:   Friends/Service system; Family; Support needed     Religion: Religion/Spirituality Are You A Religious Person?: No  Leisure/Recreation: Leisure / Recreation Do You Have Hobbies?: No  Exercise/Diet: Exercise/Diet Do You Exercise?: No Have You Gained or Lost A Significant Amount of Weight in the Past Six Months?: No Do You Follow a Special Diet?: No Do You Have Any Trouble Sleeping?: Yes Explanation of Sleeping Difficulties: Trouble falling and staying asleep   CCA Employment/Education Employment/Work Situation: Employment / Work Situation Employment Situation: Unemployed Patient's Job has Been Impacted by Current Illness: No Has Patient ever Been in Equities trader?: Yes (Describe in comment) Did You Receive Any Psychiatric Treatment/Services While in the [employer]?: No Type of Psychiatric Treatment/Services in Hotel manager: Receive treatment from [employer]  Education: Education Is Patient Currently Attending School?: No Did Theme park manager?: No Did You Have An Individualized Education Program (IIEP): No Did You Have Any Difficulty At [employer]?: No Patient's Education Has Been Impacted by Current Illness: No   CCA Family/Childhood History Family and Relationship History: Family history Marital status: Single Does patient have children?: Yes How many children?: 1 How is patient's relationship with their children?: Distant  Childhood History:  Childhood History By whom was/is the patient raised?: Mother Did patient suffer any verbal/emotional/physical/sexual abuse as a child?: No Did patient suffer from severe childhood neglect?: No Has patient ever been sexually abused/assaulted/raped as an adolescent or adult?: No Was the patient ever a victim of a crime or a disaster?: No Witnessed domestic violence?: No Has patient been  affected by domestic violence as an adult?: No  Child/Adolescent Assessment:     CCA Substance Use Alcohol/Drug Use: Alcohol / Drug Use Pain Medications: See PTA Prescriptions: See PTA Over the Counter: See PTA History of alcohol / drug use?: Yes Longest period of sobriety (when/how long): Unable to quantify Negative Consequences of Use: Legal Withdrawal Symptoms: Tremors Substance #1 Name of Substance 1: Methamphetamine 1 - Last Use / Amount: 12/43/22 Substance #2 Name of Substance 2: Cocaine 2 - Age of First Use: unk 2 - Amount (size/oz): "A lot" 2 - Frequency: Daily 2 - Duration: unk 2 - Last Use / Amount: 11/30/21 Substance #3 Name of Substance 3: Cannabis 3 - Last Use / Amount: 11/30/21 Substance #4 Name of Substance 4: Alcohol 4 - Frequency: Daily 4 - Last Use / Amount: 12/20/21                 ASAM's:  Six Dimensions of Multidimensional Assessment  Dimension 1:  Acute Intoxication and/or Withdrawal Potential:   Dimension 1:  Description of individual's past and current experiences of substance use and withdrawal: Pt has a chronic  hx of polysubstance abuse  Dimension 2:  Biomedical Conditions and Complications:      Dimension 3:  Emotional, Behavioral, or Cognitive Conditions and Complications:     Dimension 4:  Readiness to Change:     Dimension 5:  Relapse, Continued use, or Continued Problem Potential:     Dimension 6:  Recovery/Living Environment:     ASAM Severity Score: ASAM's Severity Rating Score: 16  ASAM Recommended Level of Treatment: ASAM Recommended Level of Treatment: Level III Residential Treatment   Substance use Disorder (SUD) Substance Use Disorder (SUD)  Checklist Symptoms of Substance Use: Continued use despite having a persistent/recurrent physical/psychological problem caused/exacerbated by use, Continued use despite persistent or recurrent social, interpersonal problems, caused or exacerbated by use, Presence of craving or strong  urge to use  Recommendations for Services/Supports/Treatments: Recommendations for Services/Supports/Treatments Recommendations For Services/Supports/Treatments: SAIOP (Substance Abuse Intensive Outpatient Program), Medication Management, Inpatient Hospitalization  DSM5 Diagnoses: Patient Active Problem List   Diagnosis Date Noted   Cannabis dependence, continuous ([place]) 06/18/2021   Alcohol abuse with alcohol-induced mood disorder ([place]) 06/18/2021   Crohn's disease ([place]) 06/18/2021   Nicotine dependence 06/18/2021   Other personal history presenting hazards to health 06/18/2021   Peptic ulcer 06/18/2021   Severe alcohol dependence ([place]) 06/18/2021   Major depressive disorder, recurrent episode, severe, with psychosis ([place]) 06/16/2021   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

## 2021-12-01 NOTE — ED Notes (Signed)
Pt left before discharge papers or vitals could be obtained. E-signature not working at this time. Pt verbalized understanding of D/C instructions, prescriptions and follow up care with no further questions at this time. Pt in NAD and ambulatory at time of D/C.

## 2021-12-08 ENCOUNTER — Other Ambulatory Visit: Payer: Self-pay

## 2021-12-08 ENCOUNTER — Emergency Department
Admission: EM | Admit: 2021-12-08 | Discharge: 2021-12-11 | Disposition: A | Discharge status: Home / Self Care | Payer: No Typology Code available for payment source | Attending: Emergency Medicine | Admitting: Emergency Medicine

## 2021-12-08 DIAGNOSIS — Y900 Blood alcohol level of less than 20 mg/100 ml: Secondary | ICD-10-CM | POA: Insufficient documentation

## 2021-12-08 DIAGNOSIS — Z20822 Contact with and (suspected) exposure to covid-19: Secondary | ICD-10-CM | POA: Insufficient documentation

## 2021-12-08 DIAGNOSIS — I10 Essential (primary) hypertension: Secondary | ICD-10-CM | POA: Insufficient documentation

## 2021-12-08 DIAGNOSIS — Z046 Encounter for general psychiatric examination, requested by authority: Secondary | ICD-10-CM | POA: Diagnosis not present

## 2021-12-08 DIAGNOSIS — Z91010 Allergy to peanuts: Secondary | ICD-10-CM | POA: Diagnosis not present

## 2021-12-08 DIAGNOSIS — F4389 Other reactions to severe stress: Secondary | ICD-10-CM | POA: Diagnosis not present

## 2021-12-08 DIAGNOSIS — Z79899 Other long term (current) drug therapy: Secondary | ICD-10-CM | POA: Diagnosis not present

## 2021-12-08 DIAGNOSIS — F22 Delusional disorders: Secondary | ICD-10-CM | POA: Diagnosis not present

## 2021-12-08 DIAGNOSIS — F29 Unspecified psychosis not due to a substance or known physiological condition: Secondary | ICD-10-CM | POA: Insufficient documentation

## 2021-12-08 DIAGNOSIS — F4310 Post-traumatic stress disorder, unspecified: Secondary | ICD-10-CM

## 2021-12-08 LAB — CBC WITH DIFFERENTIAL/PLATELET
Abs Immature Granulocytes: 0.03 10*3/uL (ref 0.00–0.07)
Basophils Absolute: 0.1 10*3/uL (ref 0.0–0.1)
Basophils Relative: 1 %
Eosinophils Absolute: 0.3 10*3/uL (ref 0.0–0.5)
Eosinophils Relative: 2 %
HCT: 47.4 % (ref 39.0–52.0)
Hemoglobin: 15.8 g/dL (ref 13.0–17.0)
Immature Granulocytes: 0 %
Lymphocytes Relative: 26 %
Lymphs Abs: 3.2 10*3/uL (ref 0.7–4.0)
MCH: 29.4 pg (ref 26.0–34.0)
MCHC: 33.3 g/dL (ref 30.0–36.0)
MCV: 88.3 fL (ref 80.0–100.0)
Monocytes Absolute: 0.7 10*3/uL (ref 0.1–1.0)
Monocytes Relative: 6 %
Neutro Abs: 8 10*3/uL — ABNORMAL HIGH (ref 1.7–7.7)
Neutrophils Relative %: 65 %
Platelets: 236 10*3/uL (ref 150–400)
RBC: 5.37 MIL/uL (ref 4.22–5.81)
RDW: 12.9 % (ref 11.5–15.5)
WBC: 12.3 10*3/uL — ABNORMAL HIGH (ref 4.0–10.5)
nRBC: 0 % (ref 0.0–0.2)

## 2021-12-08 LAB — RESP PANEL BY RT-PCR (FLU A&B, COVID) ARPGX2
Influenza A by PCR: NEGATIVE
Influenza B by PCR: NEGATIVE
SARS Coronavirus 2 by RT PCR: NEGATIVE

## 2021-12-08 LAB — COMPREHENSIVE METABOLIC PANEL
ALT: 18 U/L (ref 0–44)
AST: 27 U/L (ref 15–41)
Albumin: 5.2 g/dL — ABNORMAL HIGH (ref 3.5–5.0)
Alkaline Phosphatase: 70 U/L (ref 38–126)
Anion gap: 10 (ref 5–15)
BUN: 20 mg/dL (ref 6–20)
CO2: 24 mmol/L (ref 22–32)
Calcium: 9.8 mg/dL (ref 8.9–10.3)
Chloride: 99 mmol/L (ref 98–111)
Creatinine, Ser: 1.86 mg/dL — ABNORMAL HIGH (ref 0.61–1.24)
GFR, Estimated: 47 mL/min — ABNORMAL LOW (ref >60)
Glucose, Bld: 95 mg/dL (ref 70–99)
Potassium: 4 mmol/L (ref 3.5–5.1)
Sodium: 133 mmol/L — ABNORMAL LOW (ref 135–145)
Total Bilirubin: 1 mg/dL (ref 0.3–1.2)
Total Protein: 9.3 g/dL — ABNORMAL HIGH (ref 6.5–8.1)

## 2021-12-08 LAB — ETHANOL: Alcohol, Ethyl (B): <10 mg/dL (ref <10)

## 2021-12-08 LAB — SALICYLATE LEVEL: Salicylate Lvl: <7 mg/dL — ABNORMAL LOW (ref 7.0–30.0)

## 2021-12-08 LAB — ACETAMINOPHEN LEVEL: Acetaminophen (Tylenol), Serum: <10 ug/mL — ABNORMAL LOW (ref 10–30)

## 2021-12-08 NOTE — ED Notes (Signed)
Patient states he is here because he feels like someone is going to hurt him.  Patient is vague and not forthcoming with information.

## 2021-12-08 NOTE — ED Notes (Signed)
PATIENT PRESENTS WITH THE FOLLOWING  1 PAIR OF BLACK SHOES  GREY SWEATPANTS  WHITE T-SHIRT  1 GREEN HAT  1 BOOKBAG  1 POCKET KNIFE (GIVEN TO SECURITY)  1 GOLD RING   GREY HOODIE  1 CELLPHONE  1 BLUETOOTH SPEAKER  LOOSE CHANGE  1 LIGTHER  INK PEN  SNUFF

## 2021-12-08 NOTE — ED Triage Notes (Signed)
Pt BIB [employer] under IVC. Per police officer, pt called PD hallucinating saying people are trying to kill him. Pt denies SI/HI, auditory or visual hallucinations.

## 2021-12-08 NOTE — ED Provider Notes (Signed)
[facility] Emergency Department Provider Note   ____________________________________________   Event Date/Time   First MD Initiated Contact with Patient 12/08/21 2309     (approximate)  I have reviewed the triage vital signs and the nursing notes.   HISTORY  Chief Complaint IVC    HPI Jeremy Frank is a 35 y.o. male brought to the ED by [employer] under IVC for hallucinating and paranoia, thinks people are trying to kill him.  Patient currently denies SI/HI/AH/VH.  Voices no medical complaints.  Of note, patient is wearing an ankle monitor.     Past Medical History:  Diagnosis Date   Drug dependence ([place])    GI bleed    Hiatal hernia    Hypertension    PTSD (post-traumatic stress disorder)     Patient Active Problem List   Diagnosis Date Noted   Cannabis dependence, continuous ([place]) 06/18/2021   Alcohol abuse with alcohol-induced mood disorder ([place]) 06/18/2021   Crohn's disease ([place]) 06/18/2021   Nicotine dependence 06/18/2021   Other personal history presenting hazards to health 06/18/2021   Peptic ulcer 06/18/2021   Severe alcohol dependence ([place]) 06/18/2021   Major depressive disorder, recurrent episode, severe, with psychosis ([place]) 06/16/2021   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 Authorizing Provider  divalproex (DEPAKOTE) 500 MG DR tablet Take 1 tablet (500 mg total) by mouth every 12 (twelve) hours. 11/11/21   Clapacs, Madie [place], MD  gabapentin (NEURONTIN) 300 MG capsule Take 1 capsule (300 mg total) by mouth 3 (three) times daily.  11/11/21   Clapacs, Madie Reno, MD  haloperidol (HALDOL) 2 MG tablet Take 1 tablet (2 mg total) by mouth 2 (two) times daily. 11/11/21   Clapacs, Madie Reno, MD  pantoprazole (PROTONIX) 40 MG tablet Take 2 tablets (80 mg total) by mouth daily. 11/11/21   Clapacs, Madie Reno, MD    Allergies Peanut-containing drug products, Cheese, and Eggs or egg-derived products  No family history on file.  Social History Social History   Tobacco Use   Smoking status: Every Day    Packs/day: 0.25    Types: Cigarettes   Smokeless tobacco: Never  Vaping Use   Vaping Use: Never used  Substance Use Topics   Alcohol use: Yes    Alcohol/week: 42.0 standard drinks    Types: 42 Cans of beer per week    Comment: per pt he drinks 6-12 cans of beer daily   Drug use: Yes    Types: Cocaine, Marijuana    Review of Systems  Constitutional: No fever/chills Eyes: No visual changes. ENT: No sore throat. Cardiovascular: Denies chest pain. Respiratory: Denies shortness of breath. Gastrointestinal: No abdominal pain.  No nausea, no vomiting.  No diarrhea.  No constipation. Genitourinary: Negative for dysuria. Musculoskeletal: Negative for back pain. Skin: Negative for rash. Neurological: Negative for headaches, focal weakness or numbness. Psychiatric: Positive for hallucinations.  ____________________________________________   PHYSICAL EXAM:  VITAL SIGNS: ED Triage Vitals  Enc Vitals Group     BP 12/08/21 2045 (!) 142/89     Pulse Rate 12/08/21 2045 (!) 103  Resp 12/08/21 2045 18     Temp 12/08/21 2045 99.4 F (37.4 C)     Temp Source 12/08/21 2045 Oral     SpO2 12/08/21 2045 98 %     Weight --      Height --      Head Circumference --      Peak Flow --      Pain Score 12/08/21 2050 0     Pain Loc --      Pain Edu? --      Excl. in [place]? --     Constitutional: Sleep, awakened for exam.  Alert and oriented. Well appearing and in no acute distress. Eyes: Conjunctivae are normal. PERRL.  EOMI. Head: Atraumatic. Nose: No congestion/rhinnorhea. Mouth/Throat: Mucous membranes are moist.  Oropharynx non-erythematous. Neck: No stridor.   Cardiovascular: Normal rate, regular rhythm. Grossly normal heart sounds.  Good peripheral circulation. Respiratory: Normal respiratory effort.  No retractions. Lungs CTAB. Gastrointestinal: Soft and nontender. No distention. No abdominal bruits. No CVA tenderness. Musculoskeletal: No lower extremity tenderness nor edema.  No joint effusions. Neurologic:  Normal speech and language. No gross focal neurologic deficits are appreciated. No gait instability. Skin:  Skin is warm, dry and intact. No rash noted. Psychiatric: Mood and affect are normal. Speech and behavior are normal.  ____________________________________________   LABS (all labs ordered are listed, but only abnormal results are displayed)  Labs Reviewed  COMPREHENSIVE METABOLIC PANEL - Abnormal; Notable for the following components:      Result Value   Sodium 133 (*)    Creatinine, Ser 1.86 (*)    Total Protein 9.3 (*)    Albumin 5.2 (*)    GFR, Estimated 47 (*)    All other components within normal limits  CBC WITH DIFFERENTIAL/PLATELET - Abnormal; Notable for the following components:   WBC 12.3 (*)    Neutro Abs 8.0 (*)    All other components within normal limits  SALICYLATE LEVEL - Abnormal; Notable for the following components:   Salicylate Lvl [id number] (*)    All other components within normal limits  ACETAMINOPHEN LEVEL - Abnormal; Notable for the following components:   Acetaminophen (Tylenol), Serum <10 (*)    All other components within normal limits  RESP PANEL BY RT-PCR (FLU A&B, COVID) ARPGX2  ETHANOL  URINE DRUG SCREEN, QUALITATIVE ([facility] ONLY)   ____________________________________________  EKG  None ____________________________________________  RADIOLOGY I, Zackarie Chason J, personally viewed and evaluated these images (plain radiographs) as part of my  medical decision making, as well as reviewing the written report by the radiologist.  ED MD interpretation: None  Official radiology report(s): No results found.  ____________________________________________   PROCEDURES  Procedure(s) performed (including Critical Care):  Procedures   ____________________________________________   INITIAL IMPRESSION / ASSESSMENT AND PLAN / ED COURSE  As part of my medical decision making, I reviewed the following data within the [place] notes reviewed and incorporated, Labs reviewed, Old chart reviewed, A consult was requested and obtained from this/these consultant(s) Psychiatry, and Notes from prior ED visits     35 year old male brought for IVC for hallucinations. Medically cleared. The patient has been placed in psychiatric observation due to the need to provide a safe environment for the patient while obtaining psychiatric consultation and evaluation, as well as ongoing medical and medication management to treat the patient's condition.  The patient has been placed under full IVC at this time.   Clinical Course as of 12/09/21 0546  Mon Dec 09, 2021  1694 Remains in the ED under IVC pending psychiatric evaluation and disposition. [JS]    Clinical Course User Index [JS] Irean Hong, MD     ____________________________________________   FINAL CLINICAL IMPRESSION(S) / ED DIAGNOSES  Final diagnoses:  Psychosis, unspecified psychosis type (HCC)  PTSD (post-traumatic stress disorder)     ED Discharge Orders     None        Note:  This document was prepared using Dragon voice recognition software and may include unintentional dictation errors.    Irean Hong, MD 12/09/21 [phone]

## 2021-12-09 DIAGNOSIS — F29 Unspecified psychosis not due to a substance or known physiological condition: Secondary | ICD-10-CM

## 2021-12-09 MED ORDER — GABAPENTIN 300 MG PO CAPS
300.0000 mg | ORAL_CAPSULE | Freq: Three times a day (TID) | ORAL | Status: DC
  Administered 2021-12-09 – 2021-12-11 (×5): 300 mg via ORAL
  Filled 2021-12-09 (×5): qty 1

## 2021-12-09 MED ORDER — HALOPERIDOL 0.5 MG PO TABS
2.0000 mg | ORAL_TABLET | Freq: Two times a day (BID) | ORAL | Status: DC
  Administered 2021-12-09 – 2021-12-11 (×4): 2 mg via ORAL
  Filled 2021-12-09 (×2): qty 4
  Filled 2021-12-09 (×4): qty 1
  Filled 2021-12-09: qty 4

## 2021-12-09 MED ORDER — PANTOPRAZOLE SODIUM 40 MG PO TBEC
80.0000 mg | DELAYED_RELEASE_TABLET | Freq: Every day | ORAL | Status: DC
  Administered 2021-12-10 – 2021-12-11 (×2): 80 mg via ORAL
  Filled 2021-12-09 (×2): qty 2

## 2021-12-09 MED ORDER — LORAZEPAM 2 MG/ML IJ SOLN
2.0000 mg | Freq: Once | INTRAMUSCULAR | Status: AC
  Administered 2021-12-09: 2 mg via INTRAMUSCULAR
  Filled 2021-12-09: qty 1

## 2021-12-09 MED ORDER — DIPHENHYDRAMINE HCL 50 MG/ML IJ SOLN
50.0000 mg | Freq: Once | INTRAMUSCULAR | Status: AC
  Administered 2021-12-09: 50 mg via INTRAMUSCULAR
  Filled 2021-12-09: qty 1

## 2021-12-09 MED ORDER — DIVALPROEX SODIUM 500 MG PO DR TAB
500.0000 mg | DELAYED_RELEASE_TABLET | Freq: Two times a day (BID) | ORAL | Status: DC
  Administered 2021-12-09 – 2021-12-11 (×4): 500 mg via ORAL
  Filled 2021-12-09 (×4): qty 1

## 2021-12-09 MED ORDER — HALOPERIDOL LACTATE 5 MG/ML IJ SOLN
5.0000 mg | Freq: Once | INTRAMUSCULAR | Status: AC
  Administered 2021-12-09: 5 mg via INTRAMUSCULAR
  Filled 2021-12-09: qty 1

## 2021-12-09 NOTE — BH Assessment (Signed)
[facility] is currently [facility]; however[facility].   Per Kyla at [facility] [phone] ext. [phone], there is 1 bed available and 2 other patients are under review. Kyla reports she will follow up later this evening or tomm morning.  Per Jacki Cones at [facility] [phone] ext [phone], there are 2 beds available. Fax packet to be reviewed. [phone] Per Corrie Dandy, at [place], [place] [phone] ext. [phone], they are not accepting transfers.   Writer faxed referral to [facility]. Under review.

## 2021-12-09 NOTE — ED Notes (Signed)
Patient is upset about his ankle bracelet still being on. He wants to leave so he can get if off! Declined his shower and doesn't want to be here.

## 2021-12-09 NOTE — ED Notes (Signed)
Patient was given breakfast tray with juice.

## 2021-12-09 NOTE — ED Notes (Signed)
Snack and drink was given 

## 2021-12-09 NOTE — ED Notes (Signed)
Pt into bathroom and unable to be verbally directed out by [employer]. Pt states he is thirsty, declines water we give him because he thinks that we have done something to it. States he needs to leave and that we cannot make him stay here. Pt eventually directed to room after approx 5 min of talking by [employer] officer. Security at pt side and attempts to get patient back into room. Explained that pt was under IVC for his own safety and pt continues to argue that he came in voluntarily. Explained need for IM medications as pt is agitated, unable to be redirected, and uncooperative with care. Assured that he was safe in the ER. Pt eventually steps back into room when  multiple security officers become present and [employer]. After attempted verbal de-escalation for 5 minutes, pt sat down on the bed voluntarily and took IM injections X 2 by RN and Sam, ED Consulting civil engineer. Pt continues to sit on bed as staff leave room.

## 2021-12-09 NOTE — ED Notes (Signed)
Hospital meal provided.  100% consumed, pt tolerated w/o complaints.  Waste discarded appropriately.   

## 2021-12-09 NOTE — ED Notes (Signed)
Belongings placed in [place] locker room

## 2021-12-09 NOTE — ED Notes (Addendum)
RN asked pt if he would take his daily medications, pt then proceeds to start talking loudly stating that he is leaving and that he shouldn't be here. Refused PO's. Pt then walked outside of his room and then is verbally de-escalated by [employer]. [employer] Officer able to verbally de-escalate patient back to his room.   EDP aware and ordered IM medications if needed.  Pt calm at this time but uncooperative with medications and care. Will hold IM medications at this time after conversation with security and [employer]; with consensus being that pt went back to room with verbal de-escalation and staying in room at this time. Will continue to monitor.

## 2021-12-09 NOTE — ED Notes (Signed)
Pt given lunch and sprite to drink Pt currently asleep, this tech placed lunch dray within reach of patient.

## 2021-12-09 NOTE — ED Notes (Signed)
Pt yelling, cursing, states that he is leaving. Thinks that we are keeping him hostage and that we made him miss his appointment to remove his ankle bracelet. BPD at bedside to verbally deescalate. Writer conversed with psych NP to see what disposition consult resulted in. Pt was allegedly non cooperative with assessment and would not provide psych team any information. At this time pt remains IVC. Pt in room currently and obviously unhappy with staying but is quiet at this time.

## 2021-12-09 NOTE — ED Notes (Addendum)
Snack placed at side. Pt sleeping.  

## 2021-12-09 NOTE — Consult Note (Signed)
[facility] Face-to-Face Psychiatry Consult   Reason for Consult: Paranoia Referring Physician: EDP Patient Identification: Jeremy Frank MRN:  [id number] Principal Diagnosis: Psychosis [facility]) Diagnosis:  Principal Problem:   Psychosis ([place])   Total Time spent with patient: 45 minutes  Subjective:   Jeremy Frank is a 35 y.o. male patient admitted with "I just want to get out of here."  HPI: Patient seen and chart was reviewed.  Patient is irritable, states that he does not want to talk to me.  He does admit that he "knows" people are following him everywhere for over a month.  "They talk trash about me they are carrying guns and try to exploit people with mental illness."  Patient states that "I want to live."  He states that he does not have any suicidal thoughts and he is not going to disclose anything else that to me.  He states he has been staying at a hotel that he has been homeless for 1 day and he needs to be immediately to find a place to live.  He states that he will harm the people that are trying to harm him, but does not expound on his plans.  He just says they are "trashy ass white people."  Patient refuses to acknowledge whether or not he is hearing voices or having visual hallucinations. He may be responding t oniternal stimuli.  Patient states he eats and sleeps "when I can."  He refuses to provide information regarding drug use.  Advised patient that he will be staying in the hospital for stabilization. Writer attempted to contact mother, Dorcas Carrow, [phone], whose name was on IVC paperwork.  No answer and unable to think a message.  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 Family Psychiatric  History: Unknown Social History:  Social History   Substance and Sexual Activity  Alcohol Use Yes   Alcohol/week: 42.0 standard drinks   Types: 42 Cans of beer per week   Comment: per pt he drinks 6-12 cans of beer daily     Social History   Substance and Sexual Activity  Drug Use Yes   Types: Cocaine, Marijuana    Social History   Socioeconomic History   Marital status: Single    Spouse name: Not on file   Number of children: Not on file   Years of education: Not on file   Highest education level: Not on file  Occupational History   Not on file  Tobacco Use   Smoking status: Every Day    Packs/day: 0.25    Types: Cigarettes   Smokeless tobacco: Never  Vaping Use   Vaping Use: Never used  Substance and Sexual Activity   Alcohol use: Yes    Alcohol/week: 42.0 standard drinks    Types: 42 Cans of beer per week    Comment: per pt he drinks 6-12 cans of beer daily   Drug use: Yes    Types:  Cocaine, Marijuana   Sexual activity: Not Currently  Other Topics Concern   Not on file  Social History Narrative   Not on file   Social Determinants of Health   Financial Resource Strain: Not on file  Food Insecurity: Not on file  Transportation Needs: Not on file  Physical Activity: Not on file  Stress: Not on file  Social Connections: Not on file   Additional Social History:    Allergies:   Allergies  Allergen Reactions   Peanut-Containing Drug Products Diarrhea, Anaphylaxis and Swelling   Cheese Diarrhea   Eggs Or Egg-Derived Products Diarrhea    Labs:  Results for orders placed or performed during the hospital encounter of 12/08/21 (from the past 48 hour(s))  Resp Panel by RT-PCR (Flu A&B, Covid) Nasopharyngeal Swab     Status:  None   Collection Time: 12/08/21  8:50 PM   Specimen: Nasopharyngeal Swab; Nasopharyngeal(NP) swabs in vial transport medium  Result Value Ref Range   SARS Coronavirus 2 by RT PCR NEGATIVE NEGATIVE    Comment: (NOTE) SARS-CoV-2 target nucleic acids are NOT DETECTED.  The SARS-CoV-2 RNA is generally detectable in upper respiratory specimens during the acute phase of infection. The lowest concentration of SARS-CoV-2 viral copies this assay can detect is 138 copies/mL. A negative result does not preclude SARS-Cov-2 infection and should not be used as the sole basis for treatment or other patient management decisions. A negative result may occur with  improper specimen collection/handling, submission of specimen other than nasopharyngeal swab, presence of viral mutation(s) within the areas targeted by this assay, and inadequate number of viral copies(<138 copies/mL). A negative result must be combined with clinical observations, patient history, and epidemiological information. The expected result is Negative.  Fact Sheet for Patients:  EntrepreneurPulse.com.au  Fact Sheet for Healthcare Providers:  IncredibleEmployment.be  This test is no t yet approved or cleared by the [place] FDA and  has been authorized for detection and/or diagnosis of SARS-CoV-2 by FDA under an Emergency Use Authorization (EUA). This EUA will remain  in effect (meaning this test can be used) for the duration of the COVID-19 declaration under Section 564(b)(1) of the Act, 21 U.S.C.section 360bbb-3(b)(1), unless the authorization is terminated  or revoked sooner.       Influenza A by PCR NEGATIVE NEGATIVE   Influenza B by PCR NEGATIVE NEGATIVE    Comment: (NOTE) The Xpert Xpress SARS-CoV-2/FLU/RSV plus assay is intended as an aid in the diagnosis of influenza from Nasopharyngeal swab specimens and should not be used as a sole basis for treatment. Nasal washings  and aspirates are unacceptable for Xpert Xpress SARS-CoV-2/FLU/RSV testing.  Fact Sheet for Patients: EntrepreneurPulse.com.au  Fact Sheet for Healthcare Providers: IncredibleEmployment.be  This test is not yet approved or cleared by the [place] FDA and has been authorized for detection and/or diagnosis of SARS-CoV-2 by FDA under an Emergency Use Authorization (EUA). This EUA will remain in effect (meaning this test can be used) for the duration of the COVID-19 declaration under Section 564(b)(1) of the Act, 21 U.S.C. section 360bbb-3(b)(1), unless the authorization is terminated or revoked.  Performed at [facility], [address] [place],  [address]   Comprehensive metabolic panel     Status: Abnormal   Collection Time: 12/08/21  8:50 PM  Result Value Ref Range   Sodium 133 (L) 135 - 145 mmol/L   Potassium 4.0 3.5 - 5.1 mmol/L   Chloride 99 98 - 111 mmol/L   CO2 24  22 - 32 mmol/L   Glucose, Bld 95 70 - 99 mg/dL    Comment: Glucose reference range applies only to samples taken after fasting for at least 8 hours.   BUN 20 6 - 20 mg/dL   Creatinine, Ser 1.86 (H) 0.61 - 1.24 mg/dL   Calcium 9.8 8.9 - 10.3 mg/dL   Total Protein 9.3 (H) 6.5 - 8.1 g/dL   Albumin 5.2 (H) 3.5 - 5.0 g/dL   AST 27 15 - 41 U/L   ALT 18 0 - 44 U/L   Alkaline Phosphatase 70 38 - 126 U/L   Total Bilirubin 1.0 0.3 - 1.2 mg/dL   GFR, Estimated 47 (L) >60 mL/min    Comment: (NOTE) Calculated using the CKD-EPI Creatinine Equation (2021)    Anion gap 10 5 - 15    Comment: Performed at [facility], [address] [place], [place] [address]  Ethanol     Status: None   Collection Time: 12/08/21  8:50 PM  Result Value Ref Range   Alcohol, Ethyl (B) <10 <10 mg/dL    Comment: (NOTE) Lowest detectable limit for serum alcohol is 10 mg/dL.  For medical purposes only. Performed at [facility], [address]  [place], [place] [address]   CBC with Diff     Status: Abnormal   Collection Time: 12/08/21  8:50 PM  Result Value Ref Range   WBC 12.3 (H) 4.0 - 10.5 K/uL   RBC 5.37 4.22 - 5.81 MIL/uL   Hemoglobin 15.8 13.0 - 17.0 g/dL   HCT 47.4 39.0 - 52.0 %   MCV 88.3 80.0 - 100.0 fL   MCH 29.4 26.0 - 34.0 pg   MCHC 33.3 30.0 - 36.0 g/dL   RDW 12.9 11.5 - 15.5 %   Platelets 236 150 - 400 K/uL   nRBC 0.0 0.0 - 0.2 %   Neutrophils Relative % 65 %   Neutro Abs 8.0 (H) 1.7 - 7.7 K/uL   Lymphocytes Relative 26 %   Lymphs Abs 3.2 0.7 - 4.0 K/uL   Monocytes Relative 6 %   Monocytes Absolute 0.7 0.1 - 1.0 K/uL   Eosinophils Relative 2 %   Eosinophils Absolute 0.3 0.0 - 0.5 K/uL   Basophils Relative 1 %   Basophils Absolute 0.1 0.0 - 0.1 K/uL   Immature Granulocytes 0 %   Abs Immature Granulocytes 0.03 0.00 - 0.07 K/uL    Comment: Performed at [facility], [address] [place], [place] [id number]  Salicylate level     Status: Abnormal   Collection Time: 12/08/21  8:50 PM  Result Value Ref Range   Salicylate Lvl Q000111Q (L) 7.0 - 30.0 mg/dL    Comment: Performed at [facility], [address] [place], [place] [address]  Acetaminophen level     Status: Abnormal   Collection Time: 12/08/21  8:50 PM  Result Value Ref Range   Acetaminophen (Tylenol), Serum <10 (L) 10 - 30 ug/mL    Comment: (NOTE) Therapeutic concentrations vary significantly. A range of 10-30 ug/mL  may be an effective concentration for many patients. However, some  are best treated at concentrations outside of this range. Acetaminophen concentrations >150 ug/mL at 4 hours after ingestion  and >50 ug/mL at 12 hours after ingestion are often associated with  toxic reactions.  Performed at [facility], [address], [place], [place] [address]     Current Facility-Administered Medications  Medication Dose Route Frequency Provider Last Rate  Last Admin   diphenhydrAMINE (BENADRYL) injection 50 mg   50 mg Intramuscular Once Delton Prairie, MD       divalproex (DEPAKOTE) DR tablet 500 mg  500 mg Oral Q12H Delton Prairie, MD       gabapentin (NEURONTIN) capsule 300 mg  300 mg Oral TID Delton Prairie, MD       haloperidol (HALDOL) tablet 2 mg  2 mg Oral BID Delton Prairie, MD       haloperidol lactate (HALDOL) injection 5 mg  5 mg Intramuscular Once Delton Prairie, MD       LORazepam (ATIVAN) injection 2 mg  2 mg Intramuscular Once Delton Prairie, MD       pantoprazole (PROTONIX) EC tablet 80 mg  80 mg Oral Daily Delton Prairie, MD       Current Outpatient Medications  Medication Sig Dispense Refill   divalproex (DEPAKOTE) 500 MG DR tablet Take 1 tablet (500 mg total) by mouth every 12 (twelve) hours. 60 tablet 1   gabapentin (NEURONTIN) 300 MG capsule Take 1 capsule (300 mg total) by mouth 3 (three) times daily. 90 capsule 1   haloperidol (HALDOL) 2 MG tablet Take 1 tablet (2 mg total) by mouth 2 (two) times daily. 60 tablet 1   pantoprazole (PROTONIX) 40 MG tablet Take 2 tablets (80 mg total) by mouth daily. 60 tablet 1    Musculoskeletal: Strength & Muscle Tone: within normal limits Gait & Station: normal Patient leans: N/A            Psychiatric Specialty Exam:  Presentation  General Appearance: Appropriate for Environment  Eye Contact:Fleeting  Speech:Clear and Coherent  Speech Volume:Normal  Handedness:Right   Mood and Affect  Mood:Irritable  Affect:Blunt; Congruent; Flat   Thought Process  Thought Processes:Disorganized  Descriptions of Associations:Loose  Orientation:Full (Time, Place and Person)  Thought Content:Delusions  History of Schizophrenia/Schizoaffective disorder:No  Duration of Psychotic Symptoms:Less than six months  Hallucinations:Hallucinations: Visual Ideas of Reference:Paranoia  Suicidal Thoughts:Suicidal Thoughts: No Homicidal Thoughts:Homicidal Thoughts: No  Sensorium  Memory:Immediate  Poor  Judgment:Impaired  Insight:Lacking   Executive Functions  Concentration:Poor  Attention Span:Poor  Recall:Poor  Fund of Knowledge:Poor  Language:Poor   Psychomotor Activity  Psychomotor Activity:Psychomotor Activity: Normal  Assets  Assets:Resilience   Sleep  Sleep:Sleep: Fair  Physical Exam: Physical Exam Vitals and nursing note reviewed.  HENT:     Head: Normocephalic.     Nose: No congestion or rhinorrhea.  Eyes:     General:        Right eye: No discharge.        Left eye: No discharge.  Cardiovascular:     Rate and Rhythm: Normal rate.  Pulmonary:     Effort: Pulmonary effort is normal.  Musculoskeletal:        General: Normal range of motion.     Cervical back: Normal range of motion.  Skin:    General: Skin is dry.  Neurological:     Mental Status: He is alert and oriented to person, place, and time.  Psychiatric:        Attention and Perception: He is inattentive.        Mood and Affect: Affect is flat and angry.        Speech: Speech normal.        Behavior: Behavior is uncooperative.        Thought Content: Thought content is paranoid.        Cognition and Memory: Cognition is impaired.  Judgment: Judgment is impulsive.   Review of Systems  Psychiatric/Behavioral:  Positive for hallucinations and substance abuse.   All other systems reviewed and are negative. Blood pressure 115/79, pulse 75, temperature 97.7 F (36.5 C), temperature source Oral, resp. rate 17, SpO2 100 %. There is no height or weight on file to calculate BMI.  Treatment Plan Summary: Daily contact with patient to assess and evaluate symptoms and progress in treatment, Medication management, and Plan a 35 year old male presents via IVC for paranoia.  Patient has history of drug-induced psychosis.  Patient is not safe to leave at this point.  Will remain in the hospital and will be faxed out for hospitalization for stabilization.  Disposition: Recommend  psychiatric Inpatient admission when medically cleared.  Sherlon Handing, NP 12/09/2021 10:47 AM

## 2021-12-10 NOTE — ED Notes (Signed)
Snack tray given with beverage

## 2021-12-10 NOTE — ED Notes (Signed)
MD at bedside. 

## 2021-12-10 NOTE — BH Assessment (Signed)
Writer spoke with Jacki Cones at [facility]. There is 1 bed still available. Writer re-faxed packet to [phone]. Jacki Cones states packet will be reviewed and will call back later this evening.

## 2021-12-10 NOTE — ED Notes (Signed)
[facility]

## 2021-12-10 NOTE — ED Notes (Signed)
[facility]

## 2021-12-10 NOTE — ED Notes (Signed)
On initial round after report Pt is resting quietly in room without any s/s of distress.  Will continue to monitor throughout shift as ordered for any changes in behaviors and for continued safety.  °

## 2021-12-10 NOTE — ED Notes (Signed)
Patient provided snack at appropriate snack time.  Pt declined.  Cont to monitor as ordered

## 2021-12-10 NOTE — ED Notes (Signed)
Hospital meal provided, pt tolerated w/o complaints.  Waste discarded appropriately.  

## 2021-12-10 NOTE — ED Notes (Signed)
Pt given lunch tray.

## 2021-12-11 DIAGNOSIS — F22 Delusional disorders: Secondary | ICD-10-CM

## 2021-12-11 NOTE — ED Notes (Signed)
Patient provided snack at appropriate snack time.  Pt consumed 100% of snack provided, tolerated well w/o complaints   Trash disposted of appropriately by patient.  

## 2021-12-11 NOTE — ED Notes (Signed)
Unlocked bathroom door to allow patient to shower.  Staff continuously monitored dayroom outside of bathroom door during pt shower.  Pt was given hygiene items as well as:  I clean top, 1 clean bottom, with 1 pair of disposable underwear.  Pt changed out into clean clothing.  Staff disposed of all shower supplies. Shower room cleaned and secured for next use.  

## 2021-12-11 NOTE — ED Provider Notes (Signed)
----------------------------------------- °  10:43 AM on 12/11/2021 ----------------------------------------- Patient has been seen and evaluated by psychiatry.  They believe the patient is safe for discharge home from psychiatric standpoint.  Patient's medical work-up has been nonrevealing.  Patient will be discharged with outpatient resources.   Minna Antis, MD 12/11/21 1044

## 2021-12-11 NOTE — ED Notes (Signed)
Hospital meal provided, pt tolerated w/o complaints.  Waste discarded appropriately.  

## 2021-12-11 NOTE — ED Notes (Signed)
After shower pt approached staff and stated "I don't know why y'all forcing me to stay, I'm gonna keep doin drugs, I Like doin drugs and y'all ain't gonna stop me.  [facility] let me [facility] and I can go get high"

## 2021-12-11 NOTE — Discharge Instructions (Signed)
You have been seen in the emergency department for a  psychiatric concern. You have been evaluated both medically as well as psychiatrically. Please follow-up with your outpatient resources provided. Return to the emergency department for any worsening symptoms, or any thoughts of hurting yourself or anyone else so that we may attempt to help you. 

## 2021-12-11 NOTE — ED Notes (Signed)
On initial round after report Pt is resting quietly in room without any s/s of distress.  Will continue to monitor throughout shift as ordered for any changes in behaviors and for continued safety.  °

## 2021-12-11 NOTE — ED Provider Notes (Signed)
Emergency Medicine Observation Re-evaluation Note  Jeremy Frank is a 35 y.o. male, seen on rounds today.  Pt initially presented to the ED for complaints of psychiatric   Currently, the patient is sleeping  Physical Exam  Blood pressure 110/72, pulse 77, temperature 98.5 F (36.9 C), temperature source Oral, resp. rate 14, SpO2 97 %.  Physical Exam General: No apparent distress Neuro: resting     ED Course / MDM   Clinical Course as of 12/11/21 0421  Mon Dec 09, 2021  0545 Remains in the ED under IVC pending psychiatric evaluation and disposition. [JS]    Clinical Course User Index [JS] Irean Hong, MD    I have reviewed the labs performed to date as well as medications administered while in observation.  Recent changes in the last 24 hours include none  Plan   [facility]  Patient is under full IVC at this time.   Concha Se, MD 12/11/21 [phone]

## 2021-12-11 NOTE — ED Notes (Signed)
Pt is A/O x3 denies any SI / HI reports no A/V hallucinations.  Jeremy Frank was provided a list of homeless shelters for men as well as a schedule of the [place] transportation system.

## 2021-12-11 NOTE — ED Notes (Signed)
Pt given snack. 

## 2021-12-11 NOTE — Consult Note (Signed)
[facility] Face-to-Face Psychiatry Consult   Reason for Consult:  re-assessment Referring Physician:  EDP Patient Identification: Jeremy Frank MRN:  [id number] Principal Diagnosis: Psychosis [facility]) Diagnosis:  Principal Problem:   Psychosis (HCC)   Total Time spent with patient: 30 minutes  Subjective:   Jeremy Frank is a 35 y.o. male patient admitted with psychosis/paranoia.  HPI:  See previous consult note.  Patient is denying any thoughts of harm to anyone or himself.  He is clear and coherent at this time.  He has been calm and cooperative.  Denies any auditory or visual hallucinations or paranoia.  Patient not exhibiting any signs of psychotic behavior.  Patient does not appear to be responding to internal stimuli.  Patient states this morning "I think I just needed some rest."  He does say that he still believes people were probably following him at one time but does not believe they are following him now.  He does admit today that he is not sure if he is allowed back at his mother's house, as they did have an argument. Writer has attempted contact with patient's mother without success several times.  Patient has an ankle brace on and needs to report to his parole officer he states that since what he will be doing when he leaves.  The battery is dead and we do not have any charger for this.  Patient wants to be discharged.  He does not appear to be a risk to himself or anyone else at this time.  He no longer meets criteria for IVC and he wants to leave.  Past Psychiatric History: see previous  Risk to Self:   Risk to Others:   Prior Inpatient Therapy:   Prior Outpatient Therapy:    Past Medical History:  Past Medical History:  Diagnosis Date   Drug dependence (HCC)    GI bleed    Hiatal hernia    Hypertension    PTSD (post-traumatic stress disorder)     Past Surgical History:  Procedure Laterality Date   KNEE SURGERY     Family History: No family history on file. Family  Psychiatric  History: see previousd Social History:  Social History   Substance and Sexual Activity  Alcohol Use Yes   Alcohol/week: 42.0 standard drinks   Types: 42 Cans of beer per week   Comment: per pt he drinks 6-12 cans of beer daily     Social History   Substance and Sexual Activity  Drug Use Yes   Types: Cocaine, Marijuana    Social History   Socioeconomic History   Marital status: Single    Spouse name: Not on file   Number of children: Not on file   Years of education: Not on file   Highest education level: Not on file  Occupational History   Not on file  Tobacco Use   Smoking status: Every Day    Packs/day: 0.25    Types: Cigarettes   Smokeless tobacco: Never  Vaping Use   Vaping Use: Never used  Substance and Sexual Activity   Alcohol use: Yes    Alcohol/week: 42.0 standard drinks    Types: 42 Cans of beer per week    Comment: per pt he drinks 6-12 cans of beer daily   Drug use: Yes    Types: Cocaine, Marijuana   Sexual activity: Not Currently  Other Topics Concern   Not on file  Social History Narrative   Not on file   Social Determinants  of Health   Financial Resource Strain: Not on file  Food Insecurity: Not on file  Transportation Needs: Not on file  Physical Activity: Not on file  Stress: Not on file  Social Connections: Not on file   Additional Social History:    Allergies:   Allergies  Allergen Reactions   Peanut-Containing Drug Products Diarrhea, Anaphylaxis and Swelling   Cheese Diarrhea   Eggs Or Egg-Derived Products Diarrhea    Labs: No results found for this or any previous visit (from the past 48 hour(s)).  No current facility-administered medications for this encounter.   Current Outpatient Medications  Medication Sig Dispense Refill   divalproex (DEPAKOTE) 500 MG DR tablet Take 1 tablet (500 mg total) by mouth every 12 (twelve) hours. 60 tablet 1   gabapentin (NEURONTIN) 300 MG capsule Take 1 capsule (300 mg total) by  mouth 3 (three) times daily. 90 capsule 1   haloperidol (HALDOL) 2 MG tablet Take 1 tablet (2 mg total) by mouth 2 (two) times daily. 60 tablet 1   pantoprazole (PROTONIX) 40 MG tablet Take 2 tablets (80 mg total) by mouth daily. 60 tablet 1    Musculoskeletal: Strength & Muscle Tone: within normal limits Gait & Station: normal Patient leans: N/A     Psychiatric Specialty Exam:  Presentation  General Appearance: Appropriate for Environment  Eye Contact:Good  Speech:Clear and Coherent  Speech Volume:Normal  Handedness:Right   Mood and Affect  Mood:Euthymic  Affect:Appropriate   Thought Process  Thought Processes:Coherent  Descriptions of Associations:Intact  Orientation:Full (Time, Place and Person)  Thought Content:WDL  History of Schizophrenia/Schizoaffective disorder:No  Duration of Psychotic Symptoms:Less than six months  Hallucinations:Hallucinations: None Ideas of Reference:Delusions ("I still beleive people WERE at the house")  Suicidal Thoughts:Suicidal Thoughts: No Homicidal Thoughts:Homicidal Thoughts: No  Sensorium  Memory:Immediate Good  Judgment:Fair  Insight:Poor   Executive Functions  Concentration:Good  Attention Span:Good  [place]  Language:Good   Psychomotor Activity  Psychomotor Activity:Psychomotor Activity: Normal  Assets  Assets:Physical Health; Resilience   Sleep  Sleep:Sleep: Good  Physical Exam: Physical Exam Vitals and nursing note reviewed.  HENT:     Head: Normocephalic.     Nose: No congestion or rhinorrhea.  Eyes:     General:        Right eye: No discharge.        Left eye: No discharge.  Cardiovascular:     Rate and Rhythm: Normal rate.  Pulmonary:     Effort: Pulmonary effort is normal.  Musculoskeletal:        General: Normal range of motion.     Cervical back: Normal range of motion.  Skin:    General: Skin is dry.  Neurological:     Mental Status: He is  alert and oriented to person, place, and time.  Psychiatric:        Attention and Perception: Attention normal.        Mood and Affect: Mood normal.        Speech: Speech normal.        Behavior: Behavior normal.        Thought Content: Thought content normal.        Cognition and Memory: Cognition normal.        Judgment: Judgment normal.   Review of Systems  Psychiatric/Behavioral:  Positive for hallucinations (hx of) and substance abuse (hx of). Negative for depression, memory loss and suicidal ideas. The patient is not nervous/anxious and does not have insomnia.  All other systems reviewed and are negative. Blood pressure 123/75, pulse 77, temperature 98.7 F (37.1 C), temperature source Oral, resp. rate 16, SpO2 97 %. There is no height or weight on file to calculate BMI.  Treatment Plan Summary: 35 year old male presented to the ED under IVC with paranoia, likely due to drug use.  Patient has been in the hospital for a few days and appears to have cleared.  Patient denies any suicidal or homicidal thoughts.  He does not appear to be responding to internal stimuli.  He denies any auditory or visual hallucinations.  He is calm and cooperative today and is requesting discharge.  Discussed with Dr. Weber Cooks and EDP.  Rescinded IVC  Disposition: No evidence of imminent risk to self or others at present.   Supportive therapy provided about ongoing stressors. Discussed crisis plan, support from social network, calling 911, coming to the Emergency Department, and calling Suicide Hotline.  Sherlon Handing, NP 12/11/2021 5:14 PM

## 2021-12-11 NOTE — BH Assessment (Signed)
This writer contacted [facility] [phone] ext [phone] but no answer. TTS to follow-up

## 2021-12-11 NOTE — ED Notes (Signed)
IVC rescinded informed RN Florentina Addison

## 2021-12-31 ENCOUNTER — Emergency Department
Admission: EM | Admit: 2021-12-31 | Discharge: 2021-12-31 | Disposition: A | Discharge status: Other Acute Inpatient Hospital | Payer: No Typology Code available for payment source | Attending: Emergency Medicine | Admitting: Emergency Medicine

## 2021-12-31 ENCOUNTER — Encounter: Payer: Self-pay | Admitting: Emergency Medicine

## 2021-12-31 ENCOUNTER — Other Ambulatory Visit: Payer: Self-pay

## 2021-12-31 DIAGNOSIS — Y900 Blood alcohol level of less than 20 mg/100 ml: Secondary | ICD-10-CM | POA: Diagnosis not present

## 2021-12-31 DIAGNOSIS — F17200 Nicotine dependence, unspecified, uncomplicated: Secondary | ICD-10-CM | POA: Diagnosis present

## 2021-12-31 DIAGNOSIS — D72829 Elevated white blood cell count, unspecified: Secondary | ICD-10-CM | POA: Insufficient documentation

## 2021-12-31 DIAGNOSIS — K5090 Crohn's disease, unspecified, without complications: Secondary | ICD-10-CM | POA: Insufficient documentation

## 2021-12-31 DIAGNOSIS — F1210 Cannabis abuse, uncomplicated: Secondary | ICD-10-CM | POA: Diagnosis not present

## 2021-12-31 DIAGNOSIS — Z046 Encounter for general psychiatric examination, requested by authority: Secondary | ICD-10-CM | POA: Insufficient documentation

## 2021-12-31 DIAGNOSIS — F22 Delusional disorders: Secondary | ICD-10-CM

## 2021-12-31 DIAGNOSIS — F1020 Alcohol dependence, uncomplicated: Secondary | ICD-10-CM | POA: Diagnosis present

## 2021-12-31 DIAGNOSIS — F15959 Other stimulant use, unspecified with stimulant-induced psychotic disorder, unspecified: Secondary | ICD-10-CM | POA: Insufficient documentation

## 2021-12-31 DIAGNOSIS — F4310 Post-traumatic stress disorder, unspecified: Secondary | ICD-10-CM | POA: Diagnosis not present

## 2021-12-31 DIAGNOSIS — F29 Unspecified psychosis not due to a substance or known physiological condition: Secondary | ICD-10-CM | POA: Insufficient documentation

## 2021-12-31 DIAGNOSIS — F1420 Cocaine dependence, uncomplicated: Secondary | ICD-10-CM | POA: Insufficient documentation

## 2021-12-31 DIAGNOSIS — F333 Major depressive disorder, recurrent, severe with psychotic symptoms: Secondary | ICD-10-CM | POA: Diagnosis not present

## 2021-12-31 DIAGNOSIS — F1014 Alcohol abuse with alcohol-induced mood disorder: Secondary | ICD-10-CM | POA: Diagnosis not present

## 2021-12-31 DIAGNOSIS — F1220 Cannabis dependence, uncomplicated: Secondary | ICD-10-CM | POA: Diagnosis present

## 2021-12-31 DIAGNOSIS — R443 Hallucinations, unspecified: Secondary | ICD-10-CM

## 2021-12-31 DIAGNOSIS — F1910 Other psychoactive substance abuse, uncomplicated: Secondary | ICD-10-CM

## 2021-12-31 DIAGNOSIS — F15951 Other stimulant use, unspecified with stimulant-induced psychotic disorder with hallucinations: Secondary | ICD-10-CM | POA: Diagnosis present

## 2021-12-31 DIAGNOSIS — Z20822 Contact with and (suspected) exposure to covid-19: Secondary | ICD-10-CM | POA: Diagnosis not present

## 2021-12-31 DIAGNOSIS — S069XAA Unspecified intracranial injury with loss of consciousness status unknown, initial encounter: Secondary | ICD-10-CM | POA: Diagnosis present

## 2021-12-31 LAB — COMPREHENSIVE METABOLIC PANEL
ALT: 62 U/L — ABNORMAL HIGH (ref 0–44)
AST: 62 U/L — ABNORMAL HIGH (ref 15–41)
Albumin: 5 g/dL (ref 3.5–5.0)
Alkaline Phosphatase: 71 U/L (ref 38–126)
Anion gap: 9 (ref 5–15)
BUN: 20 mg/dL (ref 6–20)
CO2: 26 mmol/L (ref 22–32)
Calcium: 10 mg/dL (ref 8.9–10.3)
Chloride: 105 mmol/L (ref 98–111)
Creatinine, Ser: 2 mg/dL — ABNORMAL HIGH (ref 0.61–1.24)
GFR, Estimated: 43 mL/min — ABNORMAL LOW (ref >60)
Glucose, Bld: 108 mg/dL — ABNORMAL HIGH (ref 70–99)
Potassium: 3.3 mmol/L — ABNORMAL LOW (ref 3.5–5.1)
Sodium: 140 mmol/L (ref 135–145)
Total Bilirubin: 1 mg/dL (ref 0.3–1.2)
Total Protein: 9.1 g/dL — ABNORMAL HIGH (ref 6.5–8.1)

## 2021-12-31 LAB — CBC
HCT: 44.5 % (ref 39.0–52.0)
Hemoglobin: 14.8 g/dL (ref 13.0–17.0)
MCH: 29.6 pg (ref 26.0–34.0)
MCHC: 33.3 g/dL (ref 30.0–36.0)
MCV: 89 fL (ref 80.0–100.0)
Platelets: 360 10*3/uL (ref 150–400)
RBC: 5 MIL/uL (ref 4.22–5.81)
RDW: 13.4 % (ref 11.5–15.5)
WBC: 19.1 10*3/uL — ABNORMAL HIGH (ref 4.0–10.5)
nRBC: 0 % (ref 0.0–0.2)

## 2021-12-31 LAB — RESP PANEL BY RT-PCR (FLU A&B, COVID) ARPGX2
Influenza A by PCR: NEGATIVE
Influenza B by PCR: NEGATIVE
SARS Coronavirus 2 by RT PCR: NEGATIVE

## 2021-12-31 LAB — SALICYLATE LEVEL: Salicylate Lvl: <7 mg/dL — ABNORMAL LOW (ref 7.0–30.0)

## 2021-12-31 LAB — ETHANOL: Alcohol, Ethyl (B): <10 mg/dL (ref <10)

## 2021-12-31 LAB — ACETAMINOPHEN LEVEL: Acetaminophen (Tylenol), Serum: <10 ug/mL — ABNORMAL LOW (ref 10–30)

## 2021-12-31 MED ORDER — MELATONIN 5 MG PO TABS
5.0000 mg | ORAL_TABLET | Freq: Every day | ORAL | Status: DC
  Administered 2021-12-31: 5 mg via ORAL
  Filled 2021-12-31: qty 1

## 2021-12-31 MED ORDER — LORAZEPAM 2 MG PO TABS
2.0000 mg | ORAL_TABLET | Freq: Once | ORAL | Status: AC
Start: 2021-12-31 — End: 2021-12-31
  Administered 2021-12-31: 2 mg via ORAL
  Filled 2021-12-31: qty 1

## 2021-12-31 MED ORDER — LORAZEPAM 2 MG PO TABS
2.0000 mg | ORAL_TABLET | Freq: Once | ORAL | Status: AC
  Administered 2021-12-31: 2 mg via ORAL
  Filled 2021-12-31: qty 1

## 2021-12-31 MED ORDER — ZIPRASIDONE MESYLATE 20 MG IM SOLR
20.0000 mg | Freq: Once | INTRAMUSCULAR | Status: AC
  Administered 2021-12-31: 20 mg via INTRAMUSCULAR
  Filled 2021-12-31: qty 20

## 2021-12-31 MED ORDER — MELATONIN 5 MG PO TABS: 5.0000 mg | ORAL_TABLET | Freq: Every day | ORAL | Status: DC

## 2021-12-31 MED ORDER — LORAZEPAM 2 MG/ML IJ SOLN
2.0000 mg | Freq: Once | INTRAMUSCULAR | Status: AC
Start: 2021-12-31 — End: 2021-12-31
  Administered 2021-12-31: 2 mg via INTRAMUSCULAR
  Filled 2021-12-31: qty 1

## 2021-12-31 NOTE — ED Notes (Signed)
Medication administered by this RN and Marlena Clipper. Pt requiring extensive redirection in room and extensive reassurance that he will not die. Pt allowed staff to administer injections with security at bedside. Pt allowed to use the phone at this time.

## 2021-12-31 NOTE — ED Notes (Signed)
Breakfast tray held at nurse's station.

## 2021-12-31 NOTE — ED Notes (Signed)
Pt discharged under IVC to [facility].  VS stable. 3 belonging bags and backpack sent with officer. Pt cooperative with transport.

## 2021-12-31 NOTE — ED Notes (Signed)
Pt took PO Ativan with encouragement from staff / security.

## 2021-12-31 NOTE — ED Triage Notes (Signed)
Patient ambulatory to triage with steady gait, without difficulty or distress noted; pt accomp by [place] Frank [employer] for IVC; pt st "I'm here for my safety and everyone else's safety"; pt denies SI or HI however

## 2021-12-31 NOTE — ED Notes (Signed)
4 bags of belongings given to officer for transport.

## 2021-12-31 NOTE — ED Notes (Signed)
Pt now resting in bed with eyes closed, lights dimmed, respirations even and unlabored.

## 2021-12-31 NOTE — ED Notes (Signed)
Pt was standing, agitated and flexing his muscles during VS assessment.  Pt would not allow BP to be checked a 2nd time.
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
## 2021-12-31 NOTE — ED Notes (Signed)
Pt awake and asking to speak with psychiatrist.

## 2021-12-31 NOTE — ED Notes (Signed)
Medication administered as ordered. Pt noted to be increasingly paranoid at this time however remains redirectable and cooperative. Security remains at bedside with patient and speaking with patient, reassuring him at this time.

## 2021-12-31 NOTE — ED Notes (Signed)
Pt now attempting to leave. Pt repeatedly stating that he is scared. Pt able to be redirected by multiple security officers back to his room.

## 2021-12-31 NOTE — ED Notes (Signed)
EDP made aware of patient increasing diaphoresis. VORB for 2mg  PO ativan.
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
## 2021-12-31 NOTE — BH Assessment (Signed)
Writer faxed referral to [facility] [phone].   [facility]remy [facility] [place] is on capacity alert but may have a bed available on tomm 01/01/22.

## 2021-12-31 NOTE — ED Provider Notes (Signed)
[facility] Provider Note    Event Date/Time   First MD Initiated Contact with Patient 12/31/21 0130     (approximate)   History   Mental Health Problem   HPI  CHANCELER KOLB is a 36 y.o. male with a history of cannabis dependence, amphetamine abuse, cocaine abuse, alcohol abuse, PTSD, psychosis, major depressive disorder who presents under IVC for psychiatric evaluation.  Patient reports that he has not been taking his medications for at least a month.  Continues to use drugs on a daily basis.  Patient is concerned that somebody is trying to kill him.  Patient denies feeling suicidal or homicidal.  Patient does endorse auditory and visual hallucinations.  Patient denies any medical complaint     Physical Exam   Triage Vital Signs: ED Triage Vitals  Enc Vitals Group     BP 12/31/21 0025 (!) 119/94     Pulse Rate 12/31/21 0025 (!) 125     Resp 12/31/21 0025 18     Temp 12/31/21 0025 98.4 F (36.9 C)     Temp Source 12/31/21 0025 Tympanic     SpO2 12/31/21 0025 99 %     Weight 12/31/21 0025 220 lb (99.8 kg)     Height 12/31/21 0025 5\' 10"  (1.778 m)     Head Circumference --      Peak Flow --      Pain Score 12/31/21 0048 0     Pain Loc --      Pain Edu? --      Excl. in [place]? --     Most recent vital signs: Vitals:   12/31/21 0025  BP: (!) 119/94  Pulse: (!) 125  Resp: 18  Temp: 98.4 F (36.9 C)  SpO2: 99%     General: No apparent distress HEENT: moist mucous membranes CV: RRR Pulm: Normal WOB GI: soft and non tender MSK: no edema or cyanosis Neuro: face symmetric, moving all extremities Psych: Calm and cooperative, normal speech and behavior, mood and affect normal    ED Results / Procedures / Treatments   Labs (all labs ordered are listed, but only abnormal results are displayed) Labs Reviewed  COMPREHENSIVE METABOLIC PANEL - Abnormal; Notable for the following components:      Result Value   Potassium 3.3 (*)     Glucose, Bld 108 (*)    Creatinine, Ser 2.00 (*)    Total Protein 9.1 (*)    AST 62 (*)    ALT 62 (*)    GFR, Estimated 43 (*)    All other components within normal limits  SALICYLATE LEVEL - Abnormal; Notable for the following components:   Salicylate Lvl [id number] (*)    All other components within normal limits  ACETAMINOPHEN LEVEL - Abnormal; Notable for the following components:   Acetaminophen (Tylenol), Serum <10 (*)    All other components within normal limits  CBC - Abnormal; Notable for the following components:   WBC 19.1 (*)    All other components within normal limits  RESP PANEL BY RT-PCR (FLU A&B, COVID) [id number]  ETHANOL  URINE DRUG SCREEN, QUALITATIVE ([facility] ONLY)     EKG  none   RADIOLOGY none   PROCEDURES:  Critical Care performed: no  Procedures   MEDICATIONS ORDERED IN ED: Medications - No data to display   IMPRESSION / MDM / [place] / ED COURSE  I reviewed the triage vital signs and the nursing notes.  36 y.o.  male with a history of cannabis dependence, amphetamine abuse, cocaine abuse, alcohol abuse, PTSD, psychosis, major depressive disorder who presents under IVC for psychiatric evaluation.  Patient endorses polysubstance abuse and noncompliance with his psychiatric medications.  Patient is having auditory and visual hallucinations, patient is paranoid that people are trying to kill him.  He denies SI or HI.  We will keep patient under IVC.  We will get labs for medical clearance.  We will consult psychiatry.   ED COURSE: Labs done for medical clearance reviewed by me.  Negative COVID and flu swab.  Stable GFR/kidney function with no significant electrolyte derangements.  Negative alcohol salicylate and acetaminophen levels.  Elevated white count in the setting of polysubstance abuse.  Patient denies any infectious etiology at this time, has had no fever.  I do believe these findings are consistent with cocaine, alcohol, and amphetamine  abuse.  Psychiatry has evaluated patient DD and recommended inpatient placement.  Patient is now medically cleared.   EMR reviewed including patient's last psychiatric admission in July 2022 for major depressive disorder, PTSD, TBI, cocaine and amphetamine abuse.   The patient has been placed in psychiatric observation due to the need to provide a safe environment for the patient while obtaining psychiatric consultation and evaluation, as well as ongoing medical and medication management to treat the patient's condition.  The patient has been placed under full IVC at this time.         FINAL CLINICAL IMPRESSION(S) / ED DIAGNOSES   Final diagnoses:  Hallucinations  Paranoia ([place])  Polysubstance abuse ([place])     Rx / DC Orders   ED Discharge Orders     None        Note:  This document was prepared using Dragon voice recognition software and may include unintentional dictation errors.    Alfred Levins, [place], MD 12/31/21 [phone]

## 2021-12-31 NOTE — ED Notes (Signed)
Pt speaking with Animal nutritionist.  Pt is calm at this time.

## 2021-12-31 NOTE — BH Assessment (Signed)
Adult MH ° °Referral information for Psychiatric Hospitalization faxed to: ° °[facility] ° °· [facility] ([phone]-or- [phone]), ° °· [facility] ([phone]), ° °· [facility] ([phone] -or- [phone]), ° °· [place] ([phone]---[phone]---[phone]), ° °· [facility] ([phone] or [phone]) ° °· [facility] ([phone] or [phone]), ° °· [facility] ([phone]) °

## 2021-12-31 NOTE — Consult Note (Signed)
[facility] Face-to-Face Psychiatry Consult   Reason for Consult: Mental Health Problem Referring Physician: Dr. Don Perking Patient Identification: Jeremy Frank MRN:  [id number] Principal Diagnosis: <principal problem not specified> Diagnosis:  Active Problems:   Psychosis (HCC)   Posttraumatic stress disorder   Traumatic brain injury (HCC)   Cocaine dependence (HCC)   Marijuana abuse   Amphetamine and psychostimulant-induced psychosis with hallucinations (HCC)   Major depressive disorder, recurrent episode, severe, with psychosis (HCC)   Cannabis dependence, continuous (HCC)   Alcohol abuse with alcohol-induced mood disorder (HCC)   Crohn's disease (HCC)   Nicotine dependence   Severe alcohol dependence (HCC)   Total Time spent with patient: 1 hour  Subjective: "I am here for my safety." Jeremy Frank is a 36 y.o. male patient presented to [facility] ED via law enforcement with a psychiatric history of PTSD, depression, and substance use disorder. The patient is here under involuntary commitment status (IVC). The patient shared, "I'm here for my safety and everyone else's safety." The patient shared that he is currently not taking his prescribed medication. He shared that he is on Depakote, Prazosin, and "another medication, but I can't remember the name."  The patient was seen face-to-face by this provider; the chart was reviewed and consulted with Dr.Veronese on 12/31/2021 due to the patient's care. It was discussed with the EDP that the patient does meet the criteria to be admitted to the psychiatric inpatient unit.  On evaluation, the patient is alert and oriented x 3 and does endorse auditory and visual hallucinations but is cooperative and mood-congruent with affect. The patient does appear to be responding to internal and external stimuli. The patient is presenting with some delusional thinking. The patient admitted to auditory and visual hallucinations. The patient denies any suicidal,  homicidal, or self-harm ideations. The patient is presenting with some psychotic and paranoid behaviors.   HPI: Per Dr. Don Perking, Jeremy Frank is a 36 y.o. male with a history of cannabis dependence, amphetamine abuse, cocaine abuse, alcohol abuse, PTSD, psychosis, major depressive disorder who presents under IVC for psychiatric evaluation.  Patient reports that he has not been taking his medications for at least a month.  Continues to use drugs on a daily basis.  Patient is concerned that somebody is trying to kill him.  Patient denies feeling suicidal or homicidal.  Patient does endorse auditory and visual hallucinations.  Patient denies any medical complaint  Past Psychiatric History:  major depressive disorder PTSD  TBI  Cocaine abuse  Amphetamine abuse.  Risk to Self:   Risk to Others:   Prior Inpatient Therapy:   Prior Outpatient Therapy:    Past Medical History:  Past Medical History:  Diagnosis Date   Drug dependence (HCC)    GI bleed    Hiatal hernia    Hypertension    PTSD (post-traumatic stress disorder)     Past Surgical History:  Procedure Laterality Date   KNEE SURGERY     Family History: No family history on file. Family Psychiatric  History:  Social History:  Social History   Substance and Sexual Activity  Alcohol Use Yes   Alcohol/week: 42.0 standard drinks   Types: 42 Cans of beer per week   Comment: per pt he drinks 6-12 cans of beer daily     Social History   Substance and Sexual Activity  Drug Use Yes   Types: Cocaine, Marijuana    Social History   Socioeconomic History   Marital status: Single  Spouse name: Not on file   Number of children: Not on file   Years of education: Not on file   Highest education level: Not on file  Occupational History   Not on file  Tobacco Use   Smoking status: Every Day    Packs/day: 0.25    Types: Cigarettes   Smokeless tobacco: Never  Vaping Use   Vaping Use: Never used  Substance and Sexual  Activity   Alcohol use: Yes    Alcohol/week: 42.0 standard drinks    Types: 42 Cans of beer per week    Comment: per pt he drinks 6-12 cans of beer daily   Drug use: Yes    Types: Cocaine, Marijuana   Sexual activity: Not Currently  Other Topics Concern   Not on file  Social History Narrative   Not on file   Social Determinants of Health   Financial Resource Strain: Not on file  Food Insecurity: Not on file  Transportation Needs: Not on file  Physical Activity: Not on file  Stress: Not on file  Social Connections: Not on file   Additional Social History:    Allergies:   Allergies  Allergen Reactions   Peanut-Containing Drug Products Diarrhea, Anaphylaxis and Swelling   Cheese Diarrhea   Eggs Or Egg-Derived Products Diarrhea    Labs:  Results for orders placed or performed during the hospital encounter of 12/31/21 (from the past 48 hour(s))  Comprehensive metabolic panel     Status: Abnormal   Collection Time: 12/31/21  1:29 AM  Result Value Ref Range   Sodium 140 135 - 145 mmol/L   Potassium 3.3 (L) 3.5 - 5.1 mmol/L   Chloride 105 98 - 111 mmol/L   CO2 26 22 - 32 mmol/L   Glucose, Bld 108 (H) 70 - 99 mg/dL    Comment: Glucose reference range applies only to samples taken after fasting for at least 8 hours.   BUN 20 6 - 20 mg/dL   Creatinine, Ser 6.27 (H) 0.61 - 1.24 mg/dL   Calcium 03.5 8.9 - 00.9 mg/dL   Total Protein 9.1 (H) 6.5 - 8.1 g/dL   Albumin 5.0 3.5 - 5.0 g/dL   AST 62 (H) 15 - 41 U/L   ALT 62 (H) 0 - 44 U/L   Alkaline Phosphatase 71 38 - 126 U/L   Total Bilirubin 1.0 0.3 - 1.2 mg/dL   GFR, Estimated 43 (L) >60 mL/min    Comment: (NOTE) Calculated using the CKD-EPI Creatinine Equation (2021)    Anion gap 9 5 - 15    Comment: Performed at [facility], [address] [place], [place] [address]  Ethanol     Status: None   Collection Time: 12/31/21  1:29 AM  Result Value Ref Range   Alcohol, Ethyl (B) <10 <10 mg/dL    Comment:  (NOTE) Lowest detectable limit for serum alcohol is 10 mg/dL.  For medical purposes only. Performed at [facility], [address] [place], [place] [address]   Salicylate level     Status: Abnormal   Collection Time: 12/31/21  1:29 AM  Result Value Ref Range   Salicylate Lvl <7.0 (L) 7.0 - 30.0 mg/dL    Comment: Performed at [facility], [address] [place], [place] [address]  Acetaminophen level     Status: Abnormal   Collection Time: 12/31/21  1:29 AM  Result Value Ref Range   Acetaminophen (Tylenol), Serum <10 (L) 10 - 30 ug/mL  Comment: (NOTE) Therapeutic concentrations vary significantly. A range of 10-30 ug/mL  may be an effective concentration for many patients. However, some  are best treated at concentrations outside of this range. Acetaminophen concentrations >150 ug/mL at 4 hours after ingestion  and >50 ug/mL at 12 hours after ingestion are often associated with  toxic reactions.  Performed at [facility], [address] [place], [place] [address]   cbc     Status: Abnormal   Collection Time: 12/31/21  1:29 AM  Result Value Ref Range   WBC 19.1 (H) 4.0 - 10.5 K/uL   RBC 5.00 4.22 - 5.81 MIL/uL   Hemoglobin 14.8 13.0 - 17.0 g/dL   HCT 44.5 39.0 - 52.0 %   MCV 89.0 80.0 - 100.0 fL   MCH 29.6 26.0 - 34.0 pg   MCHC 33.3 30.0 - 36.0 g/dL   RDW 13.4 11.5 - 15.5 %   Platelets 360 150 - 400 K/uL   nRBC 0.0 0.0 - 0.2 %    Comment: Performed at [facility], [address], [place], [place] [address]  Resp Panel by RT-PCR (Flu A&B, Covid) Nasopharyngeal Swab     Status: None   Collection Time: 12/31/21  2:22 AM   Specimen: Nasopharyngeal Swab; Nasopharyngeal(NP) swabs in vial transport medium  Result Value Ref Range   SARS Coronavirus 2 by RT PCR NEGATIVE NEGATIVE    Comment: (NOTE) SARS-CoV-2 target nucleic acids are NOT DETECTED.  The SARS-CoV-2 RNA is generally detectable in upper respiratory specimens during  the acute phase of infection. The lowest concentration of SARS-CoV-2 viral copies this assay can detect is 138 copies/mL. A negative result does not preclude SARS-Cov-2 infection and should not be used as the sole basis for treatment or other patient management decisions. A negative result may occur with  improper specimen collection/handling, submission of specimen other than nasopharyngeal swab, presence of viral mutation(s) within the areas targeted by this assay, and inadequate number of viral copies(<138 copies/mL). A negative result must be combined with clinical observations, patient history, and epidemiological information. The expected result is Negative.  Fact Sheet for Patients:  EntrepreneurPulse.com.au  Fact Sheet for Healthcare Providers:  IncredibleEmployment.be  This test is no t yet approved or cleared by the [place] FDA and  has been authorized for detection and/or diagnosis of SARS-CoV-2 by FDA under an Emergency Use Authorization (EUA). This EUA will remain  in effect (meaning this test can be used) for the duration of the COVID-19 declaration under Section 564(b)(1) of the Act, 21 U.S.C.section 360bbb-3(b)(1), unless the authorization is terminated  or revoked sooner.       Influenza A by PCR NEGATIVE NEGATIVE   Influenza B by PCR NEGATIVE NEGATIVE    Comment: (NOTE) The Xpert Xpress SARS-CoV-2/FLU/RSV plus assay is intended as an aid in the diagnosis of influenza from Nasopharyngeal swab specimens and should not be used as a sole basis for treatment. Nasal washings and aspirates are unacceptable for Xpert Xpress SARS-CoV-2/FLU/RSV testing.  Fact Sheet for Patients: EntrepreneurPulse.com.au  Fact Sheet for Healthcare Providers: IncredibleEmployment.be  This test is not yet approved or cleared by the [place] FDA and has been authorized for detection and/or diagnosis of  SARS-CoV-2 by FDA under an Emergency Use Authorization (EUA). This EUA will remain in effect (meaning this test can be used) for the duration of the COVID-19 declaration under Section 564(b)(1) of the Act, 21 U.S.C. section 360bbb-3(b)(1), unless the authorization is terminated or revoked.  Performed at  [facility], [address], [place], [place] [address]     Current Facility-Administered Medications  Medication Dose Route Frequency Provider Last Rate Last Admin   melatonin tablet 5 mg  5 mg Oral QHS Dorothe Pea, RPH   5 mg at 12/31/21 [id number]   Current Outpatient Medications  Medication Sig Dispense Refill   divalproex (DEPAKOTE) 500 MG DR tablet Take 1 tablet (500 mg total) by mouth every 12 (twelve) hours. 60 tablet 1   gabapentin (NEURONTIN) 300 MG capsule Take 1 capsule (300 mg total) by mouth 3 (three) times daily. 90 capsule 1   haloperidol (HALDOL) 2 MG tablet Take 1 tablet (2 mg total) by mouth 2 (two) times daily. 60 tablet 1   pantoprazole (PROTONIX) 40 MG tablet Take 2 tablets (80 mg total) by mouth daily. 60 tablet 1    Musculoskeletal: Strength & Muscle Tone: within normal limits Gait & Station: normal Patient leans: N/A  Psychiatric Specialty Exam:  Presentation  General Appearance: Bizarre  Eye Contact:Fleeting  Speech:Pressured  Speech Volume:Normal  Handedness:Right   Mood and Affect  Mood:Anxious; Irritable  Affect:Inappropriate; Full Range   Thought Process  Thought Processes:Disorganized  Descriptions of Associations:Loose  Orientation:Full (Time, Place and Person)  Thought Content:Obsessions; Paranoid Ideation; Delusions  History of Schizophrenia/Schizoaffective disorder:No  Duration of Psychotic Symptoms:Less than six months  Hallucinations:Hallucinations: Auditory Description of Auditory Hallucinations: Loud noises  Ideas of Reference:Delusions; Paranoia  Suicidal Thoughts:Suicidal Thoughts: No  Homicidal  Thoughts:Homicidal Thoughts: No   Sensorium  Memory:Immediate Fair; Recent Fair; Remote Fair  Judgment:Fair  Insight:Poor   Executive Functions  Concentration:Good  Attention Span:Good  [place]  Language:Good   Psychomotor Activity  Psychomotor Activity:Psychomotor Activity: Normal   Assets  Assets:Desire for Improvement; Financial Resources/Insurance; Housing; Leisure Time; Social Support   Sleep  Sleep:Sleep: Poor   Physical Exam: Physical Exam Vitals and nursing note reviewed.  Constitutional:      Appearance: Normal appearance. He is normal weight.  HENT:     Head: Normocephalic and atraumatic.     Right Ear: External ear normal.     Left Ear: External ear normal.     Nose: Nose normal.  Cardiovascular:     Rate and Rhythm: Tachycardia present.  Pulmonary:     Effort: Pulmonary effort is normal.  Musculoskeletal:        General: Normal range of motion.     Cervical back: Normal range of motion and neck supple.  Neurological:     General: No focal deficit present.     Mental Status: He is alert. He is disoriented.  Psychiatric:        Attention and Perception: Attention normal. He perceives auditory and visual hallucinations.        Mood and Affect: Mood is anxious. Affect is inappropriate.        Speech: Speech normal.        Behavior: Behavior is agitated and withdrawn. Behavior is cooperative.        Thought Content: Thought content is paranoid and delusional.        Cognition and Memory: Cognition and memory normal.        Judgment: Judgment is inappropriate.   Review of Systems  Psychiatric/Behavioral:  Positive for hallucinations. The patient is nervous/anxious and has insomnia.   All other systems reviewed and are negative. Blood pressure (!) 119/94, pulse (!) 125, temperature 98.4 F (36.9 C), temperature source Tympanic, resp. rate 18, height 5\' 10"  (1.778 m),  weight 99.8 kg, SpO2 99 %. Body mass index is  31.57 kg/m.  Treatment Plan Summary: Plan Patient does meet criteria for psychiatric inpatient admission  Disposition: Recommend psychiatric Inpatient admission when medically cleared. Supportive therapy provided about ongoing stressors.  Caroline Sauger, NP 12/31/2021 4:53 AM

## 2021-12-31 NOTE — ED Notes (Signed)
RN unable to call report.  [facility] stated they did not have papers on the patient.  ER secretary and Nat Math, TTS made aware.

## 2021-12-31 NOTE — ED Notes (Signed)
Pt given medication to help him sleep per his request. Pt standing in doorway speaking with security. Pt appears to be responding to internal stimuli at this time. Pt with noted flat affect, staring at security staff and this RN and making comments regarding "it feels weird" and questioning his safety. Pt provided with book to "help his mind" per his request. Lights turned on per his request. Pt also noted to be profusely diaphoretic at this time, states it is due to his PTSD.

## 2021-12-31 NOTE — ED Notes (Signed)
Pt brought in by [employer], consult ordered

## 2021-12-31 NOTE — BH Assessment (Signed)
Patient has been accepted to [facility] today 12/31/21.  Patient assigned to DDU Unit Accepting physician is Dr. Margarita Rana.  Call report to [phone].  Representative was [employer].   ER Staff is aware of it:  Misty Stanley, ER Secretary  Dr. Roxan Hockey, ER MD  Amy, Patient's Nurse

## 2021-12-31 NOTE — ED Notes (Addendum)
RN received confirmation that [facility] had received the required intake papers.  RN able to call report.

## 2021-12-31 NOTE — ED Notes (Signed)
Pt standing in doorway at this time, pt requesting a phone to call his mother, pt increasingly convinced that he is going to die, noted to be increasingly paranoid, states that he is hearing "shots fired and that means he is going to die". Pt redirected back to his room, standing in doorway speaking with security at this time. Phone times explained to patient at this time.

## 2022-02-10 ENCOUNTER — Other Ambulatory Visit: Payer: Self-pay

## 2022-02-10 ENCOUNTER — Observation Stay: Payer: No Typology Code available for payment source

## 2022-02-10 ENCOUNTER — Inpatient Hospital Stay
Admission: AD | Admit: 2022-02-10 | Discharge: 2022-02-12 | DRG: 897 | Disposition: A | Discharge status: Home / Self Care | Payer: No Typology Code available for payment source | Source: Intra-hospital | Attending: Psychiatry | Admitting: Psychiatry

## 2022-02-10 ENCOUNTER — Encounter: Payer: Self-pay | Admitting: Family Medicine

## 2022-02-10 ENCOUNTER — Observation Stay
Admission: EM | Admit: 2022-02-10 | Discharge: 2022-02-10 | Payer: No Typology Code available for payment source | Attending: Internal Medicine | Admitting: Internal Medicine

## 2022-02-10 ENCOUNTER — Emergency Department: Payer: No Typology Code available for payment source

## 2022-02-10 DIAGNOSIS — F22 Delusional disorders: Secondary | ICD-10-CM

## 2022-02-10 DIAGNOSIS — R1013 Epigastric pain: Secondary | ICD-10-CM | POA: Diagnosis not present

## 2022-02-10 DIAGNOSIS — R778 Other specified abnormalities of plasma proteins: Secondary | ICD-10-CM | POA: Insufficient documentation

## 2022-02-10 DIAGNOSIS — F1510 Other stimulant abuse, uncomplicated: Secondary | ICD-10-CM

## 2022-02-10 DIAGNOSIS — F15150 Other stimulant abuse with stimulant-induced psychotic disorder with delusions: Secondary | ICD-10-CM | POA: Diagnosis not present

## 2022-02-10 DIAGNOSIS — F15151 Other stimulant abuse with stimulant-induced psychotic disorder with hallucinations: Principal | ICD-10-CM | POA: Diagnosis present

## 2022-02-10 DIAGNOSIS — K219 Gastro-esophageal reflux disease without esophagitis: Secondary | ICD-10-CM | POA: Diagnosis present

## 2022-02-10 DIAGNOSIS — Z720 Tobacco use: Secondary | ICD-10-CM | POA: Diagnosis not present

## 2022-02-10 DIAGNOSIS — Z79899 Other long term (current) drug therapy: Secondary | ICD-10-CM | POA: Diagnosis not present

## 2022-02-10 DIAGNOSIS — Z91010 Allergy to peanuts: Secondary | ICD-10-CM | POA: Insufficient documentation

## 2022-02-10 DIAGNOSIS — Z87820 Personal history of traumatic brain injury: Secondary | ICD-10-CM | POA: Diagnosis not present

## 2022-02-10 DIAGNOSIS — F32A Depression, unspecified: Secondary | ICD-10-CM | POA: Diagnosis present

## 2022-02-10 DIAGNOSIS — F15951 Other stimulant use, unspecified with stimulant-induced psychotic disorder with hallucinations: Secondary | ICD-10-CM | POA: Diagnosis not present

## 2022-02-10 DIAGNOSIS — R918 Other nonspecific abnormal finding of lung field: Secondary | ICD-10-CM | POA: Diagnosis not present

## 2022-02-10 DIAGNOSIS — Z20822 Contact with and (suspected) exposure to covid-19: Secondary | ICD-10-CM | POA: Insufficient documentation

## 2022-02-10 DIAGNOSIS — S069XAA Unspecified intracranial injury with loss of consciousness status unknown, initial encounter: Secondary | ICD-10-CM | POA: Diagnosis present

## 2022-02-10 DIAGNOSIS — F19950 Other psychoactive substance use, unspecified with psychoactive substance-induced psychotic disorder with delusions: Secondary | ICD-10-CM

## 2022-02-10 DIAGNOSIS — R440 Auditory hallucinations: Secondary | ICD-10-CM

## 2022-02-10 DIAGNOSIS — I10 Essential (primary) hypertension: Secondary | ICD-10-CM | POA: Diagnosis not present

## 2022-02-10 DIAGNOSIS — F1910 Other psychoactive substance abuse, uncomplicated: Secondary | ICD-10-CM | POA: Diagnosis not present

## 2022-02-10 DIAGNOSIS — F1420 Cocaine dependence, uncomplicated: Secondary | ICD-10-CM | POA: Diagnosis present

## 2022-02-10 DIAGNOSIS — I129 Hypertensive chronic kidney disease with stage 1 through stage 4 chronic kidney disease, or unspecified chronic kidney disease: Secondary | ICD-10-CM | POA: Insufficient documentation

## 2022-02-10 DIAGNOSIS — F17210 Nicotine dependence, cigarettes, uncomplicated: Secondary | ICD-10-CM | POA: Diagnosis present

## 2022-02-10 DIAGNOSIS — F29 Unspecified psychosis not due to a substance or known physiological condition: Secondary | ICD-10-CM | POA: Diagnosis not present

## 2022-02-10 DIAGNOSIS — F4310 Post-traumatic stress disorder, unspecified: Secondary | ICD-10-CM | POA: Diagnosis present

## 2022-02-10 DIAGNOSIS — N1831 Chronic kidney disease, stage 3a: Secondary | ICD-10-CM | POA: Diagnosis present

## 2022-02-10 DIAGNOSIS — F419 Anxiety disorder, unspecified: Secondary | ICD-10-CM | POA: Diagnosis present

## 2022-02-10 DIAGNOSIS — R079 Chest pain, unspecified: Secondary | ICD-10-CM | POA: Insufficient documentation

## 2022-02-10 DIAGNOSIS — R911 Solitary pulmonary nodule: Secondary | ICD-10-CM | POA: Diagnosis not present

## 2022-02-10 DIAGNOSIS — F12150 Cannabis abuse with psychotic disorder with delusions: Secondary | ICD-10-CM | POA: Diagnosis not present

## 2022-02-10 DIAGNOSIS — G43909 Migraine, unspecified, not intractable, without status migrainosus: Secondary | ICD-10-CM | POA: Diagnosis present

## 2022-02-10 DIAGNOSIS — F1010 Alcohol abuse, uncomplicated: Secondary | ICD-10-CM | POA: Diagnosis not present

## 2022-02-10 DIAGNOSIS — R7989 Other specified abnormal findings of blood chemistry: Secondary | ICD-10-CM | POA: Diagnosis present

## 2022-02-10 DIAGNOSIS — R443 Hallucinations, unspecified: Secondary | ICD-10-CM | POA: Diagnosis present

## 2022-02-10 HISTORY — DX: Migraine, unspecified, not intractable, without status migrainosus: G43.909

## 2022-02-10 LAB — CBC WITH DIFFERENTIAL/PLATELET
Abs Immature Granulocytes: 0 10*3/uL (ref 0.00–0.07)
Basophils Absolute: 0 10*3/uL (ref 0.0–0.1)
Basophils Relative: 0 %
Eosinophils Absolute: 0 10*3/uL (ref 0.0–0.5)
Eosinophils Relative: 0 %
HCT: 48.3 % (ref 39.0–52.0)
Hemoglobin: 15.6 g/dL (ref 13.0–17.0)
Lymphocytes Relative: 24 %
Lymphs Abs: 2.4 10*3/uL (ref 0.7–4.0)
MCH: 29.1 pg (ref 26.0–34.0)
MCHC: 32.3 g/dL (ref 30.0–36.0)
MCV: 90.1 fL (ref 80.0–100.0)
Monocytes Absolute: 0.9 10*3/uL (ref 0.1–1.0)
Monocytes Relative: 9 %
Neutro Abs: 6.8 10*3/uL (ref 1.7–7.7)
Neutrophils Relative %: 67 %
Platelets: 280 10*3/uL (ref 150–400)
RBC: 5.36 MIL/uL (ref 4.22–5.81)
RDW: 13.5 % (ref 11.5–15.5)
WBC: 10.1 10*3/uL (ref 4.0–10.5)
nRBC: 0 % (ref 0.0–0.2)

## 2022-02-10 LAB — URINE DRUG SCREEN, QUALITATIVE (ARMC ONLY)
Amphetamines, Ur Screen: POSITIVE — AB
Barbiturates, Ur Screen: NOT DETECTED
Benzodiazepine, Ur Scrn: NOT DETECTED
Cannabinoid 50 Ng, Ur ~~LOC~~: POSITIVE — AB
Cocaine Metabolite,Ur ~~LOC~~: NOT DETECTED
MDMA (Ecstasy)Ur Screen: NOT DETECTED
Methadone Scn, Ur: NOT DETECTED
Opiate, Ur Screen: NOT DETECTED
Phencyclidine (PCP) Ur S: NOT DETECTED
Tricyclic, Ur Screen: NOT DETECTED

## 2022-02-10 LAB — COMPREHENSIVE METABOLIC PANEL
ALT: 20 U/L (ref 0–44)
AST: 29 U/L (ref 15–41)
Albumin: 4.8 g/dL (ref 3.5–5.0)
Alkaline Phosphatase: 74 U/L (ref 38–126)
Anion gap: 11 (ref 5–15)
BUN: 16 mg/dL (ref 6–20)
CO2: 25 mmol/L (ref 22–32)
Calcium: 10 mg/dL (ref 8.9–10.3)
Chloride: 98 mmol/L (ref 98–111)
Creatinine, Ser: 1.42 mg/dL — ABNORMAL HIGH (ref 0.61–1.24)
GFR, Estimated: >60 mL/min (ref >60)
Glucose, Bld: 107 mg/dL — ABNORMAL HIGH (ref 70–99)
Potassium: 4.2 mmol/L (ref 3.5–5.1)
Sodium: 134 mmol/L — ABNORMAL LOW (ref 135–145)
Total Bilirubin: 0.9 mg/dL (ref 0.3–1.2)
Total Protein: 8.6 g/dL — ABNORMAL HIGH (ref 6.5–8.1)

## 2022-02-10 LAB — RESP PANEL BY RT-PCR (FLU A&B, COVID) ARPGX2
Influenza A by PCR: NEGATIVE
Influenza B by PCR: NEGATIVE
SARS Coronavirus 2 by RT PCR: NEGATIVE

## 2022-02-10 LAB — PROTIME-INR
INR: 1 (ref 0.8–1.2)
Prothrombin Time: 13.1 seconds (ref 11.4–15.2)

## 2022-02-10 LAB — TROPONIN I (HIGH SENSITIVITY)
Troponin I (High Sensitivity): 12 ng/L (ref <18)
Troponin I (High Sensitivity): 20 ng/L — ABNORMAL HIGH (ref <18)
Troponin I (High Sensitivity): 29 ng/L — ABNORMAL HIGH (ref <18)
Troponin I (High Sensitivity): 25 ng/L — ABNORMAL HIGH (ref <18)

## 2022-02-10 LAB — ETHANOL: Alcohol, Ethyl (B): <10 mg/dL (ref <10)

## 2022-02-10 LAB — LIPASE, BLOOD: Lipase: 28 U/L (ref 11–51)

## 2022-02-10 LAB — APTT: aPTT: 32 seconds (ref 24–36)

## 2022-02-10 MED ORDER — FOLIC ACID 1 MG PO TABS
1.0000 mg | ORAL_TABLET | Freq: Every day | ORAL | Status: DC
  Administered 2022-02-10: 1 mg via ORAL
  Filled 2022-02-10: qty 1

## 2022-02-10 MED ORDER — LORAZEPAM 2 MG/ML IJ SOLN: 0.0000 mg | Freq: Two times a day (BID) | INTRAMUSCULAR | Status: DC

## 2022-02-10 MED ORDER — LACTATED RINGERS IV BOLUS: 1000.0000 mL | Freq: Once | INTRAVENOUS | Status: DC

## 2022-02-10 MED ORDER — NICOTINE 21 MG/24HR TD PT24
21.0000 mg | MEDICATED_PATCH | Freq: Every day | TRANSDERMAL | Status: DC
  Administered 2022-02-10: 21 mg via TRANSDERMAL
  Filled 2022-02-10: qty 1

## 2022-02-10 MED ORDER — OLANZAPINE 10 MG IM SOLR
5.0000 mg | Freq: Once | INTRAMUSCULAR | Status: AC | PRN
  Administered 2022-02-10: 5 mg via INTRAMUSCULAR
  Filled 2022-02-10: qty 10

## 2022-02-10 MED ORDER — HALOPERIDOL 2 MG PO TABS
2.0000 mg | ORAL_TABLET | Freq: Two times a day (BID) | ORAL | Status: DC
  Administered 2022-02-10 (×2): 2 mg via ORAL
  Filled 2022-02-10 (×4): qty 1

## 2022-02-10 MED ORDER — HALOPERIDOL LACTATE 5 MG/ML IJ SOLN
5.0000 mg | Freq: Once | INTRAMUSCULAR | Status: AC
  Administered 2022-02-10: 5 mg via INTRAVENOUS
  Filled 2022-02-10: qty 1

## 2022-02-10 MED ORDER — HYDRALAZINE HCL 20 MG/ML IJ SOLN: 5.0000 mg | INTRAMUSCULAR | Status: DC | PRN

## 2022-02-10 MED ORDER — AMLODIPINE BESYLATE 5 MG PO TABS
5.0000 mg | ORAL_TABLET | Freq: Every day | ORAL | Status: DC
  Administered 2022-02-10: 5 mg via ORAL
  Filled 2022-02-10: qty 1

## 2022-02-10 MED ORDER — HALOPERIDOL LACTATE 5 MG/ML IJ SOLN: 2.0000 mg | Freq: Four times a day (QID) | INTRAMUSCULAR | Status: DC | PRN

## 2022-02-10 MED ORDER — HYDROXYZINE HCL 50 MG PO TABS: 50.0000 mg | ORAL_TABLET | ORAL | Status: DC | PRN

## 2022-02-10 MED ORDER — NITROGLYCERIN 0.4 MG SL SUBL
0.4000 mg | SUBLINGUAL_TABLET | Freq: Once | SUBLINGUAL | Status: AC
  Administered 2022-02-10: 0.4 mg via SUBLINGUAL
  Filled 2022-02-10: qty 1

## 2022-02-10 MED ORDER — ACETAMINOPHEN 325 MG PO TABS: 650.0000 mg | ORAL_TABLET | Freq: Four times a day (QID) | ORAL | Status: DC | PRN

## 2022-02-10 MED ORDER — ALUM & MAG HYDROXIDE-SIMETH 200-200-20 MG/5ML PO SUSP: 30.0000 mL | ORAL | Status: DC | PRN

## 2022-02-10 MED ORDER — PANTOPRAZOLE SODIUM 40 MG PO TBEC
80.0000 mg | DELAYED_RELEASE_TABLET | Freq: Every day | ORAL | Status: DC
  Administered 2022-02-11 – 2022-02-12 (×2): 80 mg via ORAL
  Filled 2022-02-10 (×2): qty 2

## 2022-02-10 MED ORDER — IOHEXOL 350 MG/ML SOLN
100.0000 mL | Freq: Once | INTRAVENOUS | Status: AC | PRN
  Administered 2022-02-10: 75 mL via INTRAVENOUS

## 2022-02-10 MED ORDER — HEPARIN (PORCINE) 25000 UT/250ML-% IV SOLN
1350.0000 [IU]/h | INTRAVENOUS | Status: DC
  Administered 2022-02-10: 1350 [IU]/h via INTRAVENOUS
  Filled 2022-02-10: qty 250

## 2022-02-10 MED ORDER — HALOPERIDOL 2 MG PO TABS
2.0000 mg | ORAL_TABLET | Freq: Four times a day (QID) | ORAL | Status: DC | PRN
  Filled 2022-02-10: qty 1

## 2022-02-10 MED ORDER — GABAPENTIN 300 MG PO CAPS
300.0000 mg | ORAL_CAPSULE | Freq: Three times a day (TID) | ORAL | Status: DC
  Administered 2022-02-10 (×3): 300 mg via ORAL
  Filled 2022-02-10 (×3): qty 1

## 2022-02-10 MED ORDER — LORAZEPAM 1 MG PO TABS: 1.0000 mg | ORAL_TABLET | ORAL | Status: DC | PRN

## 2022-02-10 MED ORDER — ADULT MULTIVITAMIN W/MINERALS CH
1.0000 | ORAL_TABLET | Freq: Every day | ORAL | Status: DC
  Administered 2022-02-10: 1 via ORAL
  Filled 2022-02-10: qty 1

## 2022-02-10 MED ORDER — ENOXAPARIN SODIUM 40 MG/0.4ML IJ SOSY: 40.0000 mg | PREFILLED_SYRINGE | INTRAMUSCULAR | Status: DC

## 2022-02-10 MED ORDER — ONDANSETRON HCL 4 MG/2ML IJ SOLN: 4.0000 mg | Freq: Three times a day (TID) | INTRAMUSCULAR | Status: DC | PRN

## 2022-02-10 MED ORDER — ALUM & MAG HYDROXIDE-SIMETH 200-200-20 MG/5ML PO SUSP
30.0000 mL | Freq: Once | ORAL | Status: AC
  Administered 2022-02-10: 30 mL via ORAL
  Filled 2022-02-10: qty 30

## 2022-02-10 MED ORDER — PANTOPRAZOLE SODIUM 40 MG PO TBEC
80.0000 mg | DELAYED_RELEASE_TABLET | Freq: Every day | ORAL | Status: DC
  Administered 2022-02-10: 80 mg via ORAL
  Filled 2022-02-10: qty 2

## 2022-02-10 MED ORDER — OLANZAPINE 5 MG PO TABS
5.0000 mg | ORAL_TABLET | Freq: Every day | ORAL | Status: AC
  Administered 2022-02-10: 5 mg via ORAL
  Filled 2022-02-10: qty 1

## 2022-02-10 MED ORDER — HALOPERIDOL 1 MG PO TABS
2.0000 mg | ORAL_TABLET | Freq: Two times a day (BID) | ORAL | Status: DC
  Administered 2022-02-11: 2 mg via ORAL
  Filled 2022-02-10: qty 2

## 2022-02-10 MED ORDER — THIAMINE HCL 100 MG/ML IJ SOLN: 100.0000 mg | Freq: Every day | INTRAMUSCULAR | Status: DC

## 2022-02-10 MED ORDER — LORAZEPAM 2 MG/ML IJ SOLN
1.0000 mg | INTRAMUSCULAR | Status: DC | PRN
  Administered 2022-02-10: 3 mg via INTRAVENOUS

## 2022-02-10 MED ORDER — LACTATED RINGERS IV BOLUS
1000.0000 mL | Freq: Once | INTRAVENOUS | Status: AC
  Administered 2022-02-10: 1000 mL via INTRAVENOUS

## 2022-02-10 MED ORDER — LORAZEPAM 2 MG PO TABS: 2.0000 mg | ORAL_TABLET | Freq: Four times a day (QID) | ORAL | Status: DC | PRN

## 2022-02-10 MED ORDER — DIVALPROEX SODIUM 500 MG PO DR TAB
500.0000 mg | DELAYED_RELEASE_TABLET | Freq: Two times a day (BID) | ORAL | Status: DC
  Administered 2022-02-11 – 2022-02-12 (×3): 500 mg via ORAL
  Filled 2022-02-10 (×3): qty 1

## 2022-02-10 MED ORDER — LORAZEPAM 2 MG/ML IJ SOLN
0.0000 mg | Freq: Four times a day (QID) | INTRAMUSCULAR | Status: DC
  Administered 2022-02-10: 1 mg via INTRAVENOUS
  Filled 2022-02-10: qty 2
  Filled 2022-02-10: qty 1

## 2022-02-10 MED ORDER — LACTATED RINGERS IV BOLUS
1500.0000 mL | Freq: Once | INTRAVENOUS | Status: AC
  Administered 2022-02-10: 1500 mL via INTRAVENOUS

## 2022-02-10 MED ORDER — ASPIRIN 81 MG PO CHEW
324.0000 mg | CHEWABLE_TABLET | Freq: Once | ORAL | Status: AC
  Administered 2022-02-10: 324 mg via ORAL
  Filled 2022-02-10: qty 4

## 2022-02-10 MED ORDER — HEPARIN BOLUS VIA INFUSION
4000.0000 [IU] | Freq: Once | INTRAVENOUS | Status: AC
Start: 2022-02-10 — End: 2022-02-10
  Administered 2022-02-10: 4000 [IU] via INTRAVENOUS
  Filled 2022-02-10: qty 4000

## 2022-02-10 MED ORDER — DIVALPROEX SODIUM 500 MG PO DR TAB
500.0000 mg | DELAYED_RELEASE_TABLET | Freq: Two times a day (BID) | ORAL | Status: DC
  Administered 2022-02-10 (×2): 500 mg via ORAL
  Filled 2022-02-10 (×3): qty 1

## 2022-02-10 MED ORDER — DIPHENHYDRAMINE HCL 50 MG/ML IJ SOLN
50.0000 mg | Freq: Once | INTRAMUSCULAR | Status: AC
  Administered 2022-02-10: 50 mg via INTRAVENOUS
  Filled 2022-02-10: qty 1

## 2022-02-10 MED ORDER — ASPIRIN EC 81 MG PO TBEC: 81.0000 mg | DELAYED_RELEASE_TABLET | Freq: Every day | ORAL | Status: DC

## 2022-02-10 MED ORDER — THIAMINE HCL 100 MG PO TABS
100.0000 mg | ORAL_TABLET | Freq: Every day | ORAL | Status: DC
  Administered 2022-02-10: 100 mg via ORAL
  Filled 2022-02-10: qty 1

## 2022-02-10 MED ORDER — HALOPERIDOL 1 MG PO TABS: 2.0000 mg | ORAL_TABLET | Freq: Four times a day (QID) | ORAL | Status: DC | PRN

## 2022-02-10 MED ORDER — LORAZEPAM 2 MG/ML IJ SOLN
2.0000 mg | Freq: Once | INTRAMUSCULAR | Status: AC
  Administered 2022-02-10: 2 mg via INTRAVENOUS
  Filled 2022-02-10: qty 1

## 2022-02-10 MED ORDER — MAGNESIUM HYDROXIDE 400 MG/5ML PO SUSP: 30.0000 mL | Freq: Every day | ORAL | Status: DC | PRN

## 2022-02-10 MED ORDER — TRAZODONE HCL 100 MG PO TABS: 100.0000 mg | ORAL_TABLET | Freq: Every evening | ORAL | Status: DC | PRN

## 2022-02-10 MED ORDER — GABAPENTIN 300 MG PO CAPS
300.0000 mg | ORAL_CAPSULE | Freq: Three times a day (TID) | ORAL | Status: DC
  Administered 2022-02-11 – 2022-02-12 (×5): 300 mg via ORAL
  Filled 2022-02-10 (×5): qty 1

## 2022-02-10 MED ORDER — ACETAMINOPHEN 325 MG PO TABS
650.0000 mg | ORAL_TABLET | Freq: Four times a day (QID) | ORAL | Status: DC | PRN
  Administered 2022-02-10: 650 mg via ORAL
  Filled 2022-02-10: qty 2

## 2022-02-10 MED ORDER — ENOXAPARIN SODIUM 60 MG/0.6ML IJ SOSY: 0.5000 mg/kg | PREFILLED_SYRINGE | INTRAMUSCULAR | Status: DC

## 2022-02-10 NOTE — Progress Notes (Signed)
After giving PRN Ativan, patient continued to come out into hallway and had a cord from the room wrapped in both hands, patient able to be redirected but requires constant reorientation and redirection. Security called to assist staff with Zyprexa injection and removing cords from room. Dr. Blaine Hamper at bedside along with Dr. Weber Cooks. Head CT ordered along with one time dose of medications (see MAR for dosage). Patient assisted back to bed, bed alarm on and call bell in reach.

## 2022-02-10 NOTE — BH Assessment (Signed)
Patient is to be admitted to [facility] tonight 02/10/22 after 7:30pm by Dr. Toni Amend.  Attending Physician will be Dr.  Toni Amend .   Patient has been assigned to room 307, by [facility] Charge Nurse, Wendall Mola.     ER staff is aware of the admission:  Ethelene Browns, Patient Access.

## 2022-02-10 NOTE — ED Notes (Signed)
[place]ie, psych NP at bedside at this time.

## 2022-02-10 NOTE — ED Notes (Signed)
Lt green tube and blue tube sent to lab at this time.

## 2022-02-10 NOTE — ED Notes (Signed)
Assumed care of patient.

## 2022-02-10 NOTE — ED Provider Notes (Signed)
[facility] Provider Note    Event Date/Time   First MD Initiated Contact with Patient 02/10/22 0154     (approximate)   History   Anxiety and Chest Pain   HPI  Jeremy Frank is a 36 y.o. male  with a history of cannabis dependence, amphetamine abuse, cocaine abuse, alcohol abuse, PTSD, psychosis, major depressive disorder who presents for evaluation of chest pain and paranoia.  Patient endorses drinking 40 ounces of beer and taking amphetamines this evening.  He reports that he is living in the hotel and felt like there were people in the room next-door they were out to get him.  He kept hearing voices of people plotting against him.  He reports that he started feeling very anxious and started having chest pain.  He describes the pain as a heaviness that is located in the center lower chest/epigastric region.  No shortness of breath, no nausea or vomiting, no dizziness, no diaphoresis.  Patient denies SI or HI.  Denies history of PE or DVT, recent travel immobilization, leg pain or swelling, hemoptysis, exogenous hormones.  Patient reports that he did not feel safe in the hotel this evening     Past Medical History:  Diagnosis Date   Drug dependence ([place])    GI bleed    Hiatal hernia    Hypertension    PTSD (post-traumatic stress disorder)     Past Surgical History:  Procedure Laterality Date   KNEE SURGERY       Physical Exam   Triage Vital Signs: ED Triage Vitals  Enc Vitals Group     BP 02/10/22 0158 (!) 173/110     Pulse Rate 02/10/22 0158 (!) 113     Resp 02/10/22 0158 17     Temp 02/10/22 0246 98.9 F (37.2 C)     Temp Source 02/10/22 0246 Oral     SpO2 02/10/22 0158 99 %     Weight --      Height --      Head Circumference --      Peak Flow --      Pain Score 02/10/22 0156 0     Pain Loc --      Pain Edu? --      Excl. in [place]? --     Most recent vital signs: Vitals:   02/10/22 0158 02/10/22 0246  BP: (!) 173/110 (!)  165/100  Pulse: (!) 113 (!) 105  Resp: 17 17  Temp:  98.9 F (37.2 C)  SpO2: 99% 99%     Constitutional: Alert and oriented. Well appearing and in no apparent distress. HEENT:      Head: Normocephalic and atraumatic.         Eyes: Conjunctivae are normal. Sclera is non-icteric.       Mouth/Throat: Mucous membranes are moist.       Neck: Supple with no signs of meningismus. Cardiovascular: Regular rate and rhythm. No murmurs, gallops, or rubs. 2+ symmetrical distal pulses are present in all extremities.  Respiratory: Normal respiratory effort. Lungs are clear to auscultation bilaterally.  Gastrointestinal: Soft, non tender, and non distended with positive bowel sounds. No rebound or guarding. Genitourinary: No CVA tenderness. Musculoskeletal:  No edema, cyanosis, or erythema of extremities. Neurologic: Normal speech and language. Face is symmetric. Moving all extremities. No gross focal neurologic deficits are appreciated. Skin: Skin is warm, dry and intact. No rash noted. Psychiatric: Mood and affect are normal. Speech and behavior are normal.  Auditory hallucinations and paranoia  ED Results / Procedures / Treatments   Labs (all labs ordered are listed, but only abnormal results are displayed) Labs Reviewed  COMPREHENSIVE METABOLIC PANEL - Abnormal; Notable for the following components:      Result Value   Sodium 134 (*)    Glucose, Bld 107 (*)    Creatinine, Ser 1.42 (*)    Total Protein 8.6 (*)    All other components within normal limits  URINE DRUG SCREEN, QUALITATIVE ([facility] ONLY) - Abnormal; Notable for the following components:   Amphetamines, Ur Screen POSITIVE (*)    Cannabinoid 50 Ng, Ur [place] POSITIVE (*)    All other components within normal limits  TROPONIN I (HIGH SENSITIVITY) - Abnormal; Notable for the following components:   Troponin I (High Sensitivity) 20 (*)    All other components within normal limits  TROPONIN I (HIGH SENSITIVITY) - Abnormal; Notable for  the following components:   Troponin I (High Sensitivity) 29 (*)    All other components within normal limits  LIPASE, BLOOD  CBC WITH DIFFERENTIAL/PLATELET  ETHANOL  TROPONIN I (HIGH SENSITIVITY)     EKG  ED ECG REPORT I, Rudene Re, the attending physician, personally viewed and interpreted this ECG.  Sinus tachycardia with a rate of 108, normal intervals, normal axis, no ST elevations or depressions.   RADIOLOGY I, Rudene Re, attending MD, have personally viewed and interpreted the images obtained during this visit as below:  Chest x-ray no acute finding   ___________________________________________________ Interpretation by Radiologist:  DG Chest 2 View  Result Date: 02/10/2022 CLINICAL DATA:  Chest pain. EXAM: CHEST - 2 VIEW COMPARISON:  Portable chest 08/30/2017 FINDINGS: The heart size and mediastinal contours are within normal limits. Both lungs are clear. The visualized skeletal structures are unremarkable aside from slight thoracic levoscoliosis. IMPRESSION: No active cardiopulmonary disease.  Stable chest. Electronically Signed   By: Telford Nab M.D.   On: 02/10/2022 02:19      PROCEDURES:  Critical Care performed: Yes, see critical care procedure note(s)  .Critical Care Performed by: Rudene Re, MD Authorized by: Rudene Re, MD   Critical care provider statement:    Critical care time (minutes):  30   Critical care was necessary to treat or prevent imminent or life-threatening deterioration of the following conditions:  Respiratory failure, cardiac failure, circulatory failure and CNS failure or compromise   Critical care was time spent personally by me on the following activities:  Development of treatment plan with patient or surrogate, discussions with consultants, evaluation of patient's response to treatment, examination of patient, ordering and review of laboratory studies, ordering and review of radiographic studies,  ordering and performing treatments and interventions, pulse oximetry, re-evaluation of patient's condition and review of old charts   I assumed direction of critical care for this patient from another provider in my specialty: no     Care discussed with: admitting provider      IMPRESSION / MDM / Painter / ED COURSE  I reviewed the triage vital signs and the nursing notes.  36 y.o. male  with a history of cannabis dependence, amphetamine abuse, cocaine abuse, alcohol abuse, PTSD, psychosis, major depressive disorder who presents for evaluation of chest pain and paranoia. Patient with epigastric/ lower chest heaviness. + amphetamine and EtOH tonight. Paranoid and anxious on exam with tachycardia and hypertension.   Ddx: paranoia, anxiety attack, drug induced ACS, gastritis.   Plan: EKG, trop x 2, CBC, CMP, Lipase, UDS,  CXR. Will give ASA and maalox. No indication for IVC. Will consult psych once medically cleared.    MEDICATIONS GIVEN IN ED: Medications  nitroGLYCERIN (NITROSTAT) SL tablet 0.4 mg (has no administration in time range)  aspirin chewable tablet 324 mg (324 mg Oral Given 02/10/22 0214)  alum & mag hydroxide-simeth (MAALOX/MYLANTA) 200-200-20 MG/5ML suspension 30 mL (30 mLs Oral Given 02/10/22 0214)  OLANZapine (ZYPREXA) tablet 5 mg (5 mg Oral Given 02/10/22 0435)     ED COURSE: EKG negative for acute ischemia.  First troponin was 12 with a repeat to 20 and then 29.  Patient continues to describe epigastric dull pain but now says the pain is also sharp and worse with deep inspiration.  No prior history of PE or DVT but a CTA has been ordered.  Most likely demand ischemia in the setting of hypertension from amphetamine abuse.  We will give a dose of sublingual nitro and will discuss with the hospitalist service for possible initiation of heparin.  Hospitalist service consulted. Pending psych eval   Consults: Hospitalist   EMR reviewed including last admission to  behavioral health in November 2022 for paranoia and hallucination    FINAL CLINICAL IMPRESSION(S) / ED DIAGNOSES   Final diagnoses:  Chest pain, unspecified type  Amphetamine abuse (H[facility])  Auditory hallucinations  Elevated troponin  Paranoia (psychosis) ([place])     Rx / DC Orders   ED Discharge Orders     None        Note:  This document was prepared using Dragon voice recognition software and may include unintentional dictation errors.   Please note:  Patient was evaluated in Emergency Department today for the symptoms described in the history of present illness. Patient was evaluated in the context of the global COVID-19 pandemic, which necessitated consideration that the patient might be at risk for infection with the SARS-CoV-2 virus that causes COVID-19. Institutional protocols and algorithms that pertain to the evaluation of patients at risk for COVID-19 are in a state of rapid change based on information released by regulatory bodies including the CDC and federal and state organizations. These policies and algorithms were followed during the patient's care in the ED.  Some ED evaluations and interventions may be delayed as a result of limited staffing during the pandemic.       Alfred Levins, [place], MD 02/10/22 [phone]

## 2022-02-10 NOTE — Progress Notes (Signed)
Patient becoming more and more agitated and paranoid. Patient keeps talking about being in prison for 10 years, and he can fight if he needs to. Staff continues to remind patient he is safe here, no need to fight. Patient opens door and peeks out. When this nurse is talking to him, he looks past me as if he is looking for someone else. PRN Ativan given based on CIWA score. Tele d/c'd as patient kept unhooking leadset and stating "this is pretty sturdy, I can use this." Dr. Clyde Lundborg made aware of patient's increasing paranoia. IM Zyprexa ordered.

## 2022-02-10 NOTE — Discharge Summary (Signed)
Physician Discharge Summary  PAXSON STEFANOVIC [id number] DOB: 1986-11-16 DOA: 02/10/2022  PCP: [facility]  Admit date: 02/10/2022 Discharge date: 02/10/2022  Recommendations for Outpatient Follow-up:  -pt will be transferred to [place]: none Equipment/Devices: none  Discharge Condition: still has active psychosis  CODE STATUS: full Diet recommendation: heat diet  Brief/Interim Summary (HPI):   Jeremy Frank is a 36 y.o. male with medical history significant of hypertension, GERD, traumatic brain injury, Crohn's disease, CKD stage IIIa, GI bleeding, psychosis, PTSD, depression with anxiety, polysubstance abuse (amphetamine, cocaine, marijuana, tobacco, alcohol), migraine headache, who presents with chest pain.   Pt states that his chest pain started yesterday evening after drinking 40 ounces of beer and taking amphetamines.  The chest pain is located in the front lower chest, constantly, pressure like, 5 out of 10 in severity, nonradiating.  Denies shortness of breath, cough, fever or chills. Patient has nausea, no vomiting, diarrhea.  He has some epigastric abdominal discomfort. He also reports hallucination, stating that he keeps hearing voices of people plotting against him. Denies any suicidal or homicidal ideations.   Data Reviewed and ED Course: pt was found to have WBC 10.1, troponin level 12, 20, 29, 25, pending COVID PCR, alcohol level less than 10, INR 1.0, PTT 32, lipase 28, renal function is better than recent baseline, temperature normal, blood pressure 173/110, 133/79, heart rate 130 --> 110s, RR 22, oxygen saturation 96% on room air.  Chest x-ray negative.  CT angiogram was negative for PE, but it showed lung nodules.  CT of head is negative for acute intracranial abnormalities.  Patient is placed on telemetry.  Patient is placed in telemetry bed for observation. Psychiatry, NP, Joesphine Bare is consulted.    CTA of chest 1. No evidence of arterial  dilatation or emboli. 2. No other evidence of acute CT or CTA abnormality. 3. 6 mm left lower lobe nodule and a 4 mm right lower lobe nodule. Recommend a noncontrast chest CT 3-6 months. If patient is high risk for malignancy, recommend an additional noncontrast chest CT at 18-24 months; if patient is low risk for malignancy a noncontrast chest CT at 18-24 months is optional. These guidelines do not apply to immunocompromised patients and patients with cancer. F/U in patients with significant comorbidities as clinically warranted. For lung cancer screening, adhere to Lung-RADS guidelines. Reference: Radiology. 2017; 284 (1): 228-43.   EKG: I have personally reviewed. Sinus rhythm, QTc 452, LAE, p heart rate 117, poor R wave progression   Subjective  -Chest pain and hallucination   Discharge Diagnoses and Hospital Course:   Principal Problem:   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[place]) Active Problems:   Chest pain   Elevated troponin   HTN (hypertension)   Polysubstance abuse (HCC)   Alcohol abuse   Tobacco abuse   Psychosis ([place])   Posttraumatic stress disorder   Depression   Hallucination   Chronic kidney disease, stage 3a (HCC)   GERD (gastroesophageal reflux disease)   Lung nodules   Migraine    Transfer to [facility] units:  In afternoon, patient becomes more agitated, being more paranoid. Per nurse report, patient came up to the desk asking for knife, try to keep tele box, saying it was sturdy, likely he could use it as a weapon. I consulted Dr. Weber Cooks of psychiatry urgently.  Dr.Clapacs evaluated the patient, thinks patient can be transferred to behavioral health unit if medically cleared.    Patient's blood pressure is  better controlled.  Current abdominal pressure 117/92.  His chest pain and elevated troponin is most likely due to demand ischemia secondary to elevated blood pressure and drug abuse, particularly amphetamine.  CT angiogram is negative for PE.   Troponin has already peaked at 29.  No further work-up needed.  Clinically patient is stable enough to be transferred to behavioral health units -Patient can continue amlodipine 5 mg daily for blood pressure. -Can add oral hydralazine 25 mg 3 times daily as needed for blood pressure >150. -Can continue aspirin   Chest pain and  elevated troponin: Troponin level peaked at 29 (12, 28, 29, 25), likely due to demand ischemia secondary to elevated blood pressure and drug abuse, particularly amphetamine. CTA negative for PE. Patient was initially started on IV heparin, I discontinued IV heparin.    - prn Nitroglycerin - Aspirin  - Risk factor stratification: checked FLP and A1C    HTN (hypertension): Blood pressure 173/110, 133/79.  Patient used to be on HCTZ, but currently not taking his medications. -Started  amlodipine 5 mg daily -IV hydralazine as needed - can add oral hydralazine 25 mg 3 times daily as needed for systolic blood pressure > 150   Polysubstance abuse, including alcohol abuse, tobacco abuse, marijuana, amphetamine: UDS is positive for cannabinoid and amphetamine today -Daily counseling about importance of quitting substance use -Nicotine patch -CIWA protocol   Psychosis, posttraumatic stress disorder, depression and hallucination: His acute hallucination likely due to amphetamine use. Psychiatry, NP, Joesphine Bare is consulted.  Currently patient is calm.  No suicidal or homicidal ideations. -continued his home meds: Haldol, Depakote (Depakote is also for migraine) -Patient was given 5 mg of olanzapine per psychiatry   Chronic kidney disease, stage 3a ([place]): Baseline creatinine was 2.00 on 12/31/2021.  Today his creatinine is 1.42, BUN 16, stable -Follow-up with BMP   GERD (gastroesophageal reflux disease) -Protonix 40 mg daily   Migraine: -Continue home Depakote, 500 mg twice daily   Lung nodules: CT angiogram incidentally showed pulmonary nodules, -Follow-up with PCP  for repeating CT scanning of her chest in 3-6 months      Allergies as of 02/10/2022       Reactions   Peanut-containing Drug Products Diarrhea, Anaphylaxis, Swelling   Cheese Diarrhea   Eggs Or Egg-derived Products Diarrhea     Med Rec must be completed prior to using this [place]       Allergies  Allergen Reactions   Peanut-Containing Drug Products Diarrhea, Anaphylaxis and Swelling   Cheese Diarrhea   Eggs Or Egg-Derived Products Diarrhea    Consultations: Dr. Weber Cooks of [facility]   Procedures/Studies: DG Chest 2 View  Result Date: 02/10/2022 CLINICAL DATA:  Chest pain. EXAM: CHEST - 2 VIEW COMPARISON:  Portable chest 08/30/2017 FINDINGS: The heart size and mediastinal contours are within normal limits. Both lungs are clear. The visualized skeletal structures are unremarkable aside from slight thoracic levoscoliosis. IMPRESSION: No active cardiopulmonary disease.  Stable chest. Electronically Signed   By: Telford Nab M.D.   On: 02/10/2022 02:19   CT HEAD WO CONTRAST (5MM)  Result Date: 02/10/2022 CLINICAL DATA:  Altered mental status EXAM: CT HEAD WITHOUT CONTRAST TECHNIQUE: Contiguous axial images were obtained from the base of the skull through the vertex without intravenous contrast. RADIATION DOSE REDUCTION: This exam was performed according to the departmental dose-optimization program which includes automated exposure control, adjustment of the mA and/or kV according to patient size and/or use of iterative reconstruction technique. COMPARISON:  08/30/2017 FINDINGS:  Brain: No evidence of acute infarction, hemorrhage, hydrocephalus, extra-axial collection or mass lesion/mass effect. Vascular: No hyperdense vessel or unexpected calcification. Skull: Normal. Negative for fracture or focal lesion. Sinuses/Orbits: No acute finding. Other: None. IMPRESSION: Normal head CT without contrast for age Electronically Signed   By: Jerilynn Mages.  Shick M.D.   On: 02/10/2022 17:30   CT Angio Chest  PE W and/or Wo Contrast  Result Date: 02/10/2022 CLINICAL DATA:  Anxiety and chest pain. EXAM: CT ANGIOGRAPHY CHEST WITH CONTRAST TECHNIQUE: Multidetector CT imaging of the chest was performed using the standard protocol during bolus administration of intravenous contrast. Multiplanar CT image reconstructions and MIPs were obtained to evaluate the vascular anatomy. RADIATION DOSE REDUCTION: This exam was performed according to the departmental dose-optimization program which includes automated exposure control, adjustment of the mA and/or kV according to patient size and/or use of iterative reconstruction technique. CONTRAST:  36mL OMNIPAQUE IOHEXOL 350 MG/ML SOLN COMPARISON:  Chest PA Lat earlier today, portable chest 08/30/2017 FINDINGS: Cardiovascular: The pulmonary arteries are normal in caliber without evidence of embolic filling defects. The aorta is normal in course and caliber without atherosclerotic changes. The great vessels show normal branching and opacification. The cardiac size is normal. There is no visible coronary artery calcification , no pericardial effusion. Central pulmonary veins are normal in caliber. Mediastinum/Nodes: No enlarged mediastinal, hilar, or axillary lymph nodes. Thyroid gland, trachea, and esophagus demonstrate no significant findings. There is no hiatal hernia. Lungs/Pleura: noncalcified pulmonary nodule measuring 6 mm, series 6 axial 59; fissural noncalcified 4 mm right lower lobe nodule noted on axial 45. The central airways and lungs are otherwise clear. There is no pleural effusion, thickening or pneumothorax. Upper Abdomen: No acute abnormality. Musculoskeletal: No chest wall abnormality or acute or significant osseous findings. Mild thoracic kyphosis. The ribcage is intact. Review of the MIP images confirms the above findings. IMPRESSION: 1. No evidence of arterial dilatation or emboli. 2. No other evidence of acute CT or CTA abnormality. 3. 6 mm left lower lobe nodule  and a 4 mm right lower lobe nodule. Recommend a noncontrast chest CT 3-6 months. If patient is high risk for malignancy, recommend an additional noncontrast chest CT at 18-24 months; if patient is low risk for malignancy a noncontrast chest CT at 18-24 months is optional. These guidelines do not apply to immunocompromised patients and patients with cancer. F/U in patients with significant comorbidities as clinically warranted. For lung cancer screening, adhere to Lung-RADS guidelines. Reference: Radiology. 2017; 284 (1): 228-43. Electronically Signed   By: Telford Nab M.D.   On: 02/10/2022 07:16      Discharge Exam: Vitals:   02/10/22 1054 02/10/22 1549  BP: (!) 157/110 (!) 117/92  Pulse: (!) 104 (!) 110  Resp: 20 20  Temp: 98.3 F (36.8 C)   SpO2: 100% 100%   Vitals:   02/10/22 0730 02/10/22 0930 02/10/22 1054 02/10/22 1549  BP:  (!) 160/116 (!) 157/110 (!) 117/92  Pulse: (!) 112 (!) 112 (!) 104 (!) 110  Resp: (!) 22 17 20 20   Temp:   98.3 F (36.8 C)   TempSrc:      SpO2: 96% 97% 100% 100%  Weight:        General: Not in acute distress HEENT:       Eyes: PERRL, EOMI, no scleral icterus.       ENT: No discharge from the ears and nose, no pharynx injection, no tonsillar enlargement.        Neck: No  JVD, no bruit, no mass felt. Heme: No neck lymph node enlargement. Cardiac: S1/S2, RRR, No murmurs, No gallops or rubs. Respiratory: No rales, wheezing, rhonchi or rubs. GI: Soft, nondistended, nontender, no rebound pain, no organomegaly, BS present. GU: No hematuria Ext: No pitting leg edema bilaterally. 1+DP/PT pulse bilaterally. Musculoskeletal: No joint deformities, No joint redness or warmth, no limitation of ROM in spin. Skin: No rashes.  Neuro: Alert, oriented X3, cranial nerves II-XII grossly intact, moves all extremities normally. Psych: Patient is anxious, tremulous, denies suicidal homicidal ideations, but seems to have aggressive behavior.  Has voiced  hallucination   The results of significant diagnostics from this hospitalization (including imaging, microbiology, ancillary and laboratory) are listed below for reference.     Microbiology: Recent Results (from the past 240 hour(s))  Resp Panel by RT-PCR (Flu A&B, Covid) Nasopharyngeal Swab     Status: None   Collection Time: 02/10/22  9:25 AM   Specimen: Nasopharyngeal Swab; Nasopharyngeal(NP) swabs in vial transport medium  Result Value Ref Range Status   SARS Coronavirus 2 by RT PCR NEGATIVE NEGATIVE Final    Comment: (NOTE) SARS-CoV-2 target nucleic acids are NOT DETECTED.  The SARS-CoV-2 RNA is generally detectable in upper respiratory specimens during the acute phase of infection. The lowest concentration of SARS-CoV-2 viral copies this assay can detect is 138 copies/mL. A negative result does not preclude SARS-Cov-2 infection and should not be used as the sole basis for treatment or other patient management decisions. A negative result may occur with  improper specimen collection/handling, submission of specimen other than nasopharyngeal swab, presence of viral mutation(s) within the areas targeted by this assay, and inadequate number of viral copies(<138 copies/mL). A negative result must be combined with clinical observations, patient history, and epidemiological information. The expected result is Negative.  Fact Sheet for Patients:  EntrepreneurPulse.com.au  Fact Sheet for Healthcare Providers:  IncredibleEmployment.be  This test is no t yet approved or cleared by the [place] FDA and  has been authorized for detection and/or diagnosis of SARS-CoV-2 by FDA under an Emergency Use Authorization (EUA). This EUA will remain  in effect (meaning this test can be used) for the duration of the COVID-19 declaration under Section 564(b)(1) of the Act, 21 U.S.C.section 360bbb-3(b)(1), unless the authorization is terminated  or revoked  sooner.       Influenza A by PCR NEGATIVE NEGATIVE Final   Influenza B by PCR NEGATIVE NEGATIVE Final    Comment: (NOTE) The Xpert Xpress SARS-CoV-2/FLU/RSV plus assay is intended as an aid in the diagnosis of influenza from Nasopharyngeal swab specimens and should not be used as a sole basis for treatment. Nasal washings and aspirates are unacceptable for Xpert Xpress SARS-CoV-2/FLU/RSV testing.  Fact Sheet for Patients: EntrepreneurPulse.com.au  Fact Sheet for Healthcare Providers: IncredibleEmployment.be  This test is not yet approved or cleared by the [place] FDA and has been authorized for detection and/or diagnosis of SARS-CoV-2 by FDA under an Emergency Use Authorization (EUA). This EUA will remain in effect (meaning this test can be used) for the duration of the COVID-19 declaration under Section 564(b)(1) of the Act, 21 U.S.C. section 360bbb-3(b)(1), unless the authorization is terminated or revoked.  Performed at [facility], [address] [place], [place] [address]      Labs: BNP (last 3 results) No results for input(s): BNP in the last 8760 hours. Basic Metabolic Panel: Recent Labs  Lab 02/10/22 0211  NA 134*  K 4.2  CL 98  CO2  25  GLUCOSE 107*  BUN 16  CREATININE 1.42*  CALCIUM 10.0   Liver Function Tests: Recent Labs  Lab 02/10/22 0211  AST 29  ALT 20  ALKPHOS 74  BILITOT 0.9  PROT 8.6*  ALBUMIN 4.8   Recent Labs  Lab 02/10/22 0211  LIPASE 28   No results for input(s): AMMONIA in the last 168 hours. CBC: Recent Labs  Lab 02/10/22 0211  WBC 10.1  NEUTROABS 6.8  HGB 15.6  HCT 48.3  MCV 90.1  PLT 280   Cardiac Enzymes: No results for input(s): CKTOTAL, CKMB, CKMBINDEX, TROPONINI in the last 168 hours. BNP: Invalid input(s): POCBNP CBG: No results for input(s): GLUCAP in the last 168 hours. D-Dimer No results for input(s): DDIMER in the last 72 hours. Hgb A1c No results  for input(s): HGBA1C in the last 72 hours. Lipid Profile No results for input(s): CHOL, HDL, LDLCALC, TRIG, CHOLHDL, LDLDIRECT in the last 72 hours. Thyroid function studies No results for input(s): TSH, T4TOTAL, T3FREE, THYROIDAB in the last 72 hours.  Invalid input(s): FREET3 Anemia work up No results for input(s): VITAMINB12, FOLATE, FERRITIN, TIBC, IRON, RETICCTPCT in the last 72 hours. Urinalysis    Component Value Date/Time   COLORURINE YELLOW (A) 03/23/2018 2316   APPEARANCEUR CLEAR (A) 03/23/2018 2316   LABSPEC 1.012 03/23/2018 2316   PHURINE 5.0 03/23/2018 2316   GLUCOSEU NEGATIVE 03/23/2018 2316   HGBUR NEGATIVE 03/23/2018 2316   BILIRUBINUR NEGATIVE 03/23/2018 2316   KETONESUR 5 (A) 03/23/2018 2316   PROTEINUR NEGATIVE 03/23/2018 2316   NITRITE NEGATIVE 03/23/2018 2316   LEUKOCYTESUR NEGATIVE 03/23/2018 2316   Sepsis Labs Invalid input(s): PROCALCITONIN,  WBC,  LACTICIDVEN Microbiology Recent Results (from the past 240 hour(s))  Resp Panel by RT-PCR (Flu A&B, Covid) Nasopharyngeal Swab     Status: None   Collection Time: 02/10/22  9:25 AM   Specimen: Nasopharyngeal Swab; Nasopharyngeal(NP) swabs in vial transport medium  Result Value Ref Range Status   SARS Coronavirus 2 by RT PCR NEGATIVE NEGATIVE Final    Comment: (NOTE) SARS-CoV-2 target nucleic acids are NOT DETECTED.  The SARS-CoV-2 RNA is generally detectable in upper respiratory specimens during the acute phase of infection. The lowest concentration of SARS-CoV-2 viral copies this assay can detect is 138 copies/mL. A negative result does not preclude SARS-Cov-2 infection and should not be used as the sole basis for treatment or other patient management decisions. A negative result may occur with  improper specimen collection/handling, submission of specimen other than nasopharyngeal swab, presence of viral mutation(s) within the areas targeted by this assay, and inadequate number of viral copies(<138  copies/mL). A negative result must be combined with clinical observations, patient history, and epidemiological information. The expected result is Negative.  Fact Sheet for Patients:  EntrepreneurPulse.com.au  Fact Sheet for Healthcare Providers:  IncredibleEmployment.be  This test is no t yet approved or cleared by the [place] FDA and  has been authorized for detection and/or diagnosis of SARS-CoV-2 by FDA under an Emergency Use Authorization (EUA). This EUA will remain  in effect (meaning this test can be used) for the duration of the COVID-19 declaration under Section 564(b)(1) of the Act, 21 U.S.C.section 360bbb-3(b)(1), unless the authorization is terminated  or revoked sooner.       Influenza A by PCR NEGATIVE NEGATIVE Final   Influenza B by PCR NEGATIVE NEGATIVE Final    Comment: (NOTE) The Xpert Xpress SARS-CoV-2/FLU/RSV plus assay is intended as an aid in the diagnosis of influenza  from Nasopharyngeal swab specimens and should not be used as a sole basis for treatment. Nasal washings and aspirates are unacceptable for Xpert Xpress SARS-CoV-2/FLU/RSV testing.  Fact Sheet for Patients: EntrepreneurPulse.com.au  Fact Sheet for Healthcare Providers: IncredibleEmployment.be  This test is not yet approved or cleared by the [place] FDA and has been authorized for detection and/or diagnosis of SARS-CoV-2 by FDA under an Emergency Use Authorization (EUA). This EUA will remain in effect (meaning this test can be used) for the duration of the COVID-19 declaration under Section 564(b)(1) of the Act, 21 U.S.C. section 360bbb-3(b)(1), unless the authorization is terminated or revoked.  Performed at [facility], [address], [place], [place] [address]     Time coordinating discharge:  25 minutes.   SIGNED:  Ivor Costa, MD [facility] 02/10/2022, 6:46 PM   If  7PM-7AM, please contact night-coverage [email or web address]

## 2022-02-10 NOTE — ED Notes (Signed)
ED Provider at bedside. 

## 2022-02-10 NOTE — H&P (Addendum)
History and Physical    Jeremy Frank [id number] DOB: 04-11-1986 DOA: 02/10/2022  Referring MD/NP/PA:   PCP: [facility] [place]   Patient coming from:  The patient is coming from home.  At baseline, pt is independent for most of ADL.        Chief Complaint: chest pain  HPI: Jeremy Frank is a 36 y.o. male with medical history significant of hypertension, GERD, traumatic brain injury, Crohn's disease, CKD stage IIIa, GI bleeding, psychosis, PTSD, depression with anxiety, polysubstance abuse (amphetamine, cocaine, marijuana, tobacco, alcohol), migraine headache, who presents with chest pain.  Pt states that his chest pain started yesterday evening after drinking 40 ounces of beer and taking amphetamines.  The chest pain is located in the front lower chest, constantly, pressure like, 5 out of 10 in severity, nonradiating.  Denies shortness of breath, cough, fever or chills. Patient has nausea, no vomiting, diarrhea.  He has some epigastric abdominal discomfort. He also reports hallucination, stating that he keeps hearing voices of people plotting against him. Denies any suicidal or homicidal ideations.  Data Reviewed and ED Course: pt was found to have WBC 10.1, troponin level 12, 20, 29, 25, pending COVID PCR, alcohol level less than 10, INR 1.0, PTT 32, lipase 28, renal function is better than recent baseline, temperature normal, blood pressure 173/110, 133/79, heart rate 130 --> 110s, RR 22, oxygen saturation 96% on room air.  Chest x-ray negative.  CT angiogram was negative for PE, but it showed lung nodules.  CT of head is negative for acute intracranial abnormalities.  Patient is placed on telemetry.  Patient is placed in telemetry bed for observation. Psychiatry, NP, Joesphine Bare is consulted.   CTA of chest 1. No evidence of arterial dilatation or emboli. 2. No other evidence of acute CT or CTA abnormality. 3. 6 mm left lower lobe nodule and a 4 mm right lower lobe  nodule. Recommend a noncontrast chest CT 3-6 months. If patient is high risk for malignancy, recommend an additional noncontrast chest CT at 18-24 months; if patient is low risk for malignancy a noncontrast chest CT at 18-24 months is optional. These guidelines do not apply to immunocompromised patients and patients with cancer. F/U in patients with significant comorbidities as clinically warranted. For lung cancer screening, adhere to Lung-RADS guidelines. Reference: Radiology. 2017; 284 (1): 228-43.  EKG: I have personally reviewed. Sinus rhythm, QTc 452, LAE, p heart rate 117, poor R wave progression  Review of Systems:   General: no fevers, chills, no body weight gain, has fatigue HEENT: no blurry vision, hearing changes or sore throat Respiratory: no dyspnea, coughing, wheezing CV: has chest pain, no palpitations GI: has nausea, no vomiting, abdominal pain, diarrhea, constipation GU: no dysuria, burning on urination, increased urinary frequency, hematuria  Ext: no leg edema Neuro: no unilateral weakness, numbness, or tingling, no vision change or hearing loss Skin: no rash, no skin tear. MSK: No muscle spasm, no deformity, no limitation of range of movement in spin Heme: No easy bruising.  Travel history: No recent long distant travel. Psychiatry: Patient has hallucination, no suicidal or homicidal ideations.  Patient has anxiety.  Has hallucination   Allergy:  Allergies  Allergen Reactions   Peanut-Containing Drug Products Diarrhea, Anaphylaxis and Swelling   Cheese Diarrhea   Eggs Or Egg-Derived Products Diarrhea    Past Medical History:  Diagnosis Date   Drug dependence ([place])    GI bleed    Hiatal hernia  Hypertension    Migraine    PTSD (post-traumatic stress disorder)     Past Surgical History:  Procedure Laterality Date   KNEE SURGERY      Social History:  reports that he has been smoking cigarettes. He has been smoking an average of .25 packs per day.  He has never used smokeless tobacco. He reports current alcohol use of about 42.0 standard drinks per week. He reports current drug use. Drugs: Cocaine, Marijuana, and Amphetamines.  Family History:  Family History  Problem Relation Age of Onset   Heart failure Mother    Hypertension Father      Prior to Admission medications   Medication Sig Start Date End Date Taking? Authorizing Provider  divalproex (DEPAKOTE) 500 MG DR tablet Take 1 tablet (500 mg total) by mouth every 12 (twelve) hours. Patient not taking: Reported on 02/10/2022 11/11/21   Clapacs, Madie Reno, MD  gabapentin (NEURONTIN) 300 MG capsule Take 1 capsule (300 mg total) by mouth 3 (three) times daily. Patient not taking: Reported on 02/10/2022 11/11/21   Clapacs, Madie Reno, MD  haloperidol (HALDOL) 2 MG tablet Take 1 tablet (2 mg total) by mouth 2 (two) times daily. Patient not taking: Reported on 02/10/2022 11/11/21   Clapacs, Madie Reno, MD  pantoprazole (PROTONIX) 40 MG tablet Take 2 tablets (80 mg total) by mouth daily. Patient not taking: Reported on 02/10/2022 11/11/21   Gonzella Lex, MD    Physical Exam: Vitals:   02/10/22 0618 02/10/22 0633 02/10/22 0647 02/10/22 0730  BP: (!) 158/128 133/79    Pulse: (!) 125 (!) 130  (!) 112  Resp: 20 20  (!) 22  Temp:      TempSrc:      SpO2: 99% 96%  96%  Weight:   102.6 kg    General: Not in acute distress HEENT:       Eyes: PERRL, EOMI, no scleral icterus.       ENT: No discharge from the ears and nose, no pharynx injection, no tonsillar enlargement.        Neck: No JVD, no bruit, no mass felt. Heme: No neck lymph node enlargement. Cardiac: S1/S2, RRR, No murmurs, No gallops or rubs. Respiratory: No rales, wheezing, rhonchi or rubs. GI: Soft, nondistended, nontender, no rebound pain, no organomegaly, BS present. GU: No hematuria Ext: No pitting leg edema bilaterally. 1+DP/PT pulse bilaterally. Musculoskeletal: No joint deformities, No joint redness or warmth, no limitation  of ROM in spin. Skin: No rashes.  Neuro: Alert, oriented X3, cranial nerves II-XII grossly intact, moves all extremities normally. Psych: Patient is anxious, tremulous, denies suicidal homicidal ideations.  Has voiced hallucination  Labs on Admission: I have personally reviewed following labs and imaging studies  CBC: Recent Labs  Lab 02/10/22 0211  WBC 10.1  NEUTROABS 6.8  HGB 15.6  HCT 48.3  MCV 90.1  PLT 123456   Basic Metabolic Panel: Recent Labs  Lab 02/10/22 0211  NA 134*  K 4.2  CL 98  CO2 25  GLUCOSE 107*  BUN 16  CREATININE 1.42*  CALCIUM 10.0   GFR: Estimated Creatinine Clearance: 85.4 mL/min (A) (by C-G formula based on SCr of 1.42 mg/dL (H)). Liver Function Tests: Recent Labs  Lab 02/10/22 0211  AST 29  ALT 20  ALKPHOS 74  BILITOT 0.9  PROT 8.6*  ALBUMIN 4.8   Recent Labs  Lab 02/10/22 0211  LIPASE 28   No results for input(s): AMMONIA in the last 168 hours. Coagulation  Profile: Recent Labs  Lab 02/10/22 0651  INR 1.0   Cardiac Enzymes: No results for input(s): CKTOTAL, CKMB, CKMBINDEX, TROPONINI in the last 168 hours. BNP (last 3 results) No results for input(s): PROBNP in the last 8760 hours. HbA1C: No results for input(s): HGBA1C in the last 72 hours. CBG: No results for input(s): GLUCAP in the last 168 hours. Lipid Profile: No results for input(s): CHOL, HDL, LDLCALC, TRIG, CHOLHDL, LDLDIRECT in the last 72 hours. Thyroid Function Tests: No results for input(s): TSH, T4TOTAL, FREET4, T3FREE, THYROIDAB in the last 72 hours. Anemia Panel: No results for input(s): VITAMINB12, FOLATE, FERRITIN, TIBC, IRON, RETICCTPCT in the last 72 hours. Urine analysis:    Component Value Date/Time   COLORURINE YELLOW (A) 03/23/2018 2316   APPEARANCEUR CLEAR (A) 03/23/2018 2316   LABSPEC 1.012 03/23/2018 2316   PHURINE 5.0 03/23/2018 2316   GLUCOSEU NEGATIVE 03/23/2018 2316   HGBUR NEGATIVE 03/23/2018 2316   BILIRUBINUR NEGATIVE 03/23/2018 2316    KETONESUR 5 (A) 03/23/2018 2316   PROTEINUR NEGATIVE 03/23/2018 2316   NITRITE NEGATIVE 03/23/2018 2316   LEUKOCYTESUR NEGATIVE 03/23/2018 2316   Sepsis Labs: @LABRCNTIP (procalcitonin:4,lacticidven:4) )No results found for this or any previous visit (from the past 240 hour(s)).   Radiological Exams on Admission: DG Chest 2 View  Result Date: 02/10/2022 CLINICAL DATA:  Chest pain. EXAM: CHEST - 2 VIEW COMPARISON:  Portable chest 08/30/2017 FINDINGS: The heart size and mediastinal contours are within normal limits. Both lungs are clear. The visualized skeletal structures are unremarkable aside from slight thoracic levoscoliosis. IMPRESSION: No active cardiopulmonary disease.  Stable chest. Electronically Signed   By: Telford Nab M.D.   On: 02/10/2022 02:19   CT Angio Chest PE W and/or Wo Contrast  Result Date: 02/10/2022 CLINICAL DATA:  Anxiety and chest pain. EXAM: CT ANGIOGRAPHY CHEST WITH CONTRAST TECHNIQUE: Multidetector CT imaging of the chest was performed using the standard protocol during bolus administration of intravenous contrast. Multiplanar CT image reconstructions and MIPs were obtained to evaluate the vascular anatomy. RADIATION DOSE REDUCTION: This exam was performed according to the departmental dose-optimization program which includes automated exposure control, adjustment of the mA and/or kV according to patient size and/or use of iterative reconstruction technique. CONTRAST:  53mL OMNIPAQUE IOHEXOL 350 MG/ML SOLN COMPARISON:  Chest PA Lat earlier today, portable chest 08/30/2017 FINDINGS: Cardiovascular: The pulmonary arteries are normal in caliber without evidence of embolic filling defects. The aorta is normal in course and caliber without atherosclerotic changes. The great vessels show normal branching and opacification. The cardiac size is normal. There is no visible coronary artery calcification , no pericardial effusion. Central pulmonary veins are normal in caliber.  Mediastinum/Nodes: No enlarged mediastinal, hilar, or axillary lymph nodes. Thyroid gland, trachea, and esophagus demonstrate no significant findings. There is no hiatal hernia. Lungs/Pleura: noncalcified pulmonary nodule measuring 6 mm, series 6 axial 59; fissural noncalcified 4 mm right lower lobe nodule noted on axial 45. The central airways and lungs are otherwise clear. There is no pleural effusion, thickening or pneumothorax. Upper Abdomen: No acute abnormality. Musculoskeletal: No chest wall abnormality or acute or significant osseous findings. Mild thoracic kyphosis. The ribcage is intact. Review of the MIP images confirms the above findings. IMPRESSION: 1. No evidence of arterial dilatation or emboli. 2. No other evidence of acute CT or CTA abnormality. 3. 6 mm left lower lobe nodule and a 4 mm right lower lobe nodule. Recommend a noncontrast chest CT 3-6 months. If patient is high risk for malignancy, recommend  an additional noncontrast chest CT at 18-24 months; if patient is low risk for malignancy a noncontrast chest CT at 18-24 months is optional. These guidelines do not apply to immunocompromised patients and patients with cancer. F/U in patients with significant comorbidities as clinically warranted. For lung cancer screening, adhere to Lung-RADS guidelines. Reference: Radiology. 2017; 284 (1): 228-43. Electronically Signed   By: Telford Nab M.D.   On: 02/10/2022 07:16      Assessment/Plan Principal Problem:   Chest pain Active Problems:   Elevated troponin   HTN (hypertension)   Polysubstance abuse ([facility])   Alcohol abuse   Tobacco abuse   Psychosis ([facility])   Posttraumatic stress disorder   Depression   Hallucination   Chronic kidney disease, stage 3a ([facility])   GERD (gastroesophageal reflux disease)   Lung nodules   Migraine   Addendum: In afternoon, patient becomes more agitated, being more paranoid. Per nurse report, patient came up to the desk asking for knife, try to keep tele  box, saying it was sturdy, likely he could use it as a weapon. I consulted Dr. Weber Cooks of psychiatry urgently.  Dr.Clapacs evaluated the patient, thinks patient can be transferred to behavioral health unit if medically cleared.   Patient's blood pressure is better controlled.  Current abdominal pressure 117/92.  His chest pain and elevated troponin is most likely due to demand ischemia secondary to elevated blood pressure and drug abuse, particularly amphetamine.  CT angiogram is negative for PE.  Troponin has already peaked at 29.  No further work-up needed.  Clinically patient is stable enough to be transferred to behavioral health units -Patient can continue amlodipine 5 mg daily for blood pressure. -Can add oral hydralazine 25 mg 3 times daily as needed for blood pressure >150. -Can continue aspirin  Chest pain and  elevated troponin: Troponin level peaked at 29 (12, 28, 29, 25), likely due to demand ischemia secondary to elevated blood pressure and drug abuse, particularly amphetamine. CTA negative for PE. Patient was initially started on IV heparin, I will discontinue IV heparin.  - place to tele bed for observation - prn Nitroglycerin - Aspirin  - Risk factor stratification: will check FLP and A1C   HTN (hypertension): Blood pressure 173/110, 133/79.  Patient used to be on HCTZ, but currently not taking his medications. -Start amlodipine 5 mg daily -IV hydralazine as needed  Polysubstance abuse, including alcohol abuse, tobacco abuse, marijuana, amphetamine: UDS is positive for cannabinoid and amphetamine today -Daily counseling about importance of quitting substance use -Nicotine patch -CIWA protocol  Psychosis, posttraumatic stress disorder, depression and hallucination: His acute hallucination likely due to amphetamine use. Psychiatry, NP, Joesphine Bare is consulted.  Currently patient is calm.  No suicidal or homicidal ideations. -will continue his home meds: Haldol, Depakote  (Depakote is also for migraine) -Patient was given 5 mg of olanzapine per psychiatry  Chronic kidney disease, stage 3a ([place]): Baseline creatinine was 2.00 on 12/31/2021.  Today his creatinine is 1.42, BUN 16, stable -Follow-up with BMP  GERD (gastroesophageal reflux disease) -Protonix  Migraine: -Continue home Depakote  Lung nodules: CT angiogram incidentally showed pulmonary nodules, -Follow-up with PCP for repeating CT scanning of her chest in 3-6 months      DVT ppx: IV heparin --> changed to SQ Lovenox   Code Status: Full code  Family Communication: I have tried to call his mother twice without success  Disposition Plan:  Anticipate discharge back to previous environment  Consults called: Psychiatry, NP, Joesphine Bare  is consulted.   Admission status and Level of care: Telemetry Cardiac:     for obs     Severity of Illness:  The appropriate patient status for this patient is OBSERVATION. Observation status is judged to be reasonable and necessary in order to provide the required intensity of service to ensure the patient's safety. The patient's presenting symptoms, physical exam findings, and initial radiographic and laboratory data in the context of their medical condition is felt to place them at decreased risk for further clinical deterioration. Furthermore, it is anticipated that the patient will be medically stable for discharge from the hospital within 2 midnights of admission.        Date of Service 02/10/2022    Ivor Costa [facility]   If 7PM-7AM, please contact night-coverage [email or web address] 02/10/2022, 9:22 AM

## 2022-02-10 NOTE — ED Notes (Signed)
Informed RN bed assigned 

## 2022-02-10 NOTE — Discharge Instructions (Signed)
Discharge to [facility]

## 2022-02-10 NOTE — ED Notes (Signed)
VOL/medical

## 2022-02-10 NOTE — ED Notes (Signed)
Pt reports alcohol 8-9 hours PTA.

## 2022-02-10 NOTE — Consult Note (Signed)
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 male patient admitted with "I know who I have to get away from".  HPI: Patient seen and chart reviewed.  36 year old man well known to the psychiatric service came to the ER very early this morning.  Patient was paranoid saying that people were out to get him.  He admitted that he had been using methamphetamine recently.  He was very nervous and jittery at the time but was not threatening to hurt himself or hurt anyone else in particular.  He was seen by psychiatry and it was recommended he be reassessed in the morning.  In the meantime because he had complained of chest pain and a set of troponins were drawn that came back above the normal range.  This resulted in admission to the medical service.  He has had several troponins drawn sequentially which are staying pretty much in the same range of just a little bit elevated above the normal rate but not up high into the very likely range for myocardial infarction.  This afternoon the patient became agitated on the medical service.  Pacing around.  Pulling off his cardiac leads.  Saying things to  nursing service that suggested that he might be paranoid or even aggressive.  He did not attempt to harm anyone and he was verbally redirectable.  When I came to see the patient he was pouring sweat clearly very nervous telling me that he realized that people were out to get him.  He seemed at times to be responding to internal stimuli.  At one point he briefly started barking which frightened one of the nursing staff until he explained that he was not barking at her but at some other thing that he was seeing.  Past Psychiatric History: Well known from many past encounters and some brief hospitalizations.  Recurrent substance abuse.  History of PTSD.  History possibly of bipolar or schizoaffective disorder.  Last admission here was in November at which time he was discharged on Depakote and haloperidol  Risk to Self:   Risk to Others:   Prior Inpatient Therapy:   Prior Outpatient Therapy:    Past Medical History:  Past Medical History:  Diagnosis Date   Drug dependence ([place])    GI bleed    Hiatal hernia    Hypertension    Migraine    PTSD (post-traumatic stress disorder)     Past Surgical History:  Procedure Laterality Date   KNEE SURGERY     Family History:  Family History  Problem Relation Age of Onset   Heart failure Mother    Hypertension Father    Family Psychiatric  History: See previous Social History:  Social History   Substance and Sexual Activity  Alcohol Use Yes   Alcohol/week: 42.0 standard drinks   Types: 42 Cans of beer per week   Comment: per pt he drinks 6-12 cans of beer daily     Social History   Substance and Sexual Activity  Drug Use Yes   Types: Cocaine, Marijuana, Amphetamines    Social History   Socioeconomic History   Marital status: Single    Spouse name: Not on file   Number of children: Not on file   Years of education: Not on file   Highest education level: Not on file  Occupational History   Not on file  Tobacco Use   Smoking  status: Every Day    Packs/day: 0.25    Types: Cigarettes   Smokeless tobacco: Never  Vaping Use   Vaping Use: Never used  Substance and Sexual Activity   Alcohol use: Yes    Alcohol/week: 42.0 standard drinks    Types: 42 Cans of beer per week    Comment: per pt he drinks 6-12 cans of beer daily   Drug use: Yes    Types: Cocaine, Marijuana, Amphetamines   Sexual activity: Not Currently  Other Topics Concern   Not on file  Social History Narrative   Not on file   Social Determinants of Health   Financial Resource Strain: Not on file  Food Insecurity: Not on file  Transportation Needs: Not on file  Physical Activity: Not on file  Stress: Not on file  Social Connections: Not on file   Additional Social History:    Allergies:   Allergies  Allergen Reactions   Peanut-Containing Drug Products Diarrhea, Anaphylaxis and Swelling   Cheese Diarrhea   Eggs Or Egg-Derived Products Diarrhea    Labs:  Results for orders placed or performed during the hospital encounter of 02/10/22 (from the past 48 hour(s))  Urine Drug Screen, Qualitative ([facility] only)     Status: Abnormal   Collection Time: 02/10/22  2:07 AM  Result Value Ref Range   Tricyclic, Ur Screen NONE DETECTED NONE DETECTED   Amphetamines, Ur Screen POSITIVE (A) NONE DETECTED   MDMA (Ecstasy)Ur Screen NONE DETECTED NONE DETECTED   Cocaine Metabolite,Ur [place] NONE DETECTED NONE DETECTED   Opiate, Ur Screen NONE DETECTED NONE DETECTED   Phencyclidine (PCP) Ur S NONE DETECTED NONE DETECTED   Cannabinoid 50 Ng, Ur [place] POSITIVE (A) NONE DETECTED   Barbiturates, Ur Screen NONE DETECTED NONE DETECTED   Benzodiazepine, Ur Scrn NONE DETECTED NONE DETECTED   Methadone Scn, Ur NONE DETECTED NONE DETECTED    Comment: (NOTE) Tricyclics + metabolites, urine    Cutoff 1000 ng/mL Amphetamines + metabolites, urine  Cutoff 1000 ng/mL MDMA (Ecstasy), urine              Cutoff 500 ng/mL Cocaine Metabolite, urine          Cutoff 300  ng/mL Opiate + metabolites, urine        Cutoff 300 ng/mL Phencyclidine (PCP), urine         Cutoff 25 ng/mL Cannabinoid, urine                 Cutoff 50 ng/mL Barbiturates + metabolites, urine  Cutoff 200 ng/mL Benzodiazepine, urine              Cutoff 200 ng/mL Methadone, urine                   Cutoff 300 ng/mL  The urine drug screen provides only a preliminary, unconfirmed analytical test result and should not be used for non-medical purposes. Clinical consideration and professional judgment should be applied to any positive drug screen result due to possible interfering substances. A more specific alternate chemical method must be used in order to obtain a confirmed analytical result. Gas chromatography / mass spectrometry (GC/MS) is the preferred confirm atory method. Performed at [facility], [place], [place] [address]   Troponin I (High Sensitivity)     Status: None   Collection Time: 02/10/22  2:11 AM  Result Value Ref Range   Troponin I (High Sensitivity) 12 <18 ng/L    Comment: (NOTE) Elevated high sensitivity troponin I (hsTnI) values and significant  changes across serial measurements may suggest ACS but many other  chronic and acute conditions are known to elevate hsTnI results.  Refer to the "Links" section for chest pain algorithms and additional  guidance. Performed at [facility], [address] [place], [place] [address]   Lipase, blood     Status: None   Collection Time: 02/10/22  2:11 AM  Result Value Ref Range   Lipase 28 11 - 51 U/L    Comment: Performed at [facility], [address] [place], [place] [address]  Comprehensive metabolic panel     Status: Abnormal   Collection Time: 02/10/22  2:11 AM  Result Value Ref Range   Sodium 134 (L) 135 - 145 mmol/L   Potassium 4.2 3.5 - 5.1 mmol/L   Chloride 98 98 - 111 mmol/L   CO2 25 22 - 32 mmol/L   Glucose, Bld 107 (H) 70 - 99 mg/dL    Comment: Glucose  reference range applies only to samples taken after fasting for at least 8 hours.   BUN 16 6 - 20 mg/dL   Creatinine, Ser 1.42 (H) 0.61 - 1.24 mg/dL   Calcium 10.0 8.9 - 10.3 mg/dL   Total Protein 8.6 (H) 6.5 - 8.1 g/dL   Albumin 4.8 3.5 - 5.0 g/dL   AST 29 15 - 41 U/L   ALT 20 0 - 44 U/L   Alkaline Phosphatase 74 38 - 126 U/L   Total Bilirubin 0.9 0.3 - 1.2 mg/dL   GFR, Estimated >60 >60 mL/min    Comment: (NOTE) Calculated using the CKD-EPI Creatinine Equation (2021)    Anion gap 11 5 - 15    Comment: Performed at [facility], [address] [place],  [address]  CBC with Differential     Status: None   Collection Time: 02/10/22  2:11 AM  Result Value Ref Range   WBC 10.1 4.0 - 10.5 K/uL    Comment: REPEATED TO VERIFY WHITE COUNT CONFIRMED ON SMEAR    RBC 5.36 4.22 - 5.81 MIL/uL   Hemoglobin 15.6 13.0 - 17.0 g/dL   HCT 48.3 39.0 - 52.0 %   MCV 90.1 80.0 - 100.0 fL   MCH 29.1 26.0 - 34.0 pg   MCHC 32.3 30.0 - 36.0 g/dL   RDW 13.5 11.5 - 15.5 %   Platelets 280 150 - 400 K/uL   nRBC 0.0 0.0 - 0.2 %    Comment: REPEATED TO VERIFY   Neutrophils Relative % 67 %   Neutro Abs 6.8 1.7 - 7.7 K/uL   Lymphocytes Relative 24 %   Lymphs Abs 2.4 0.7 - 4.0 K/uL   Monocytes Relative 9 %   Monocytes Absolute 0.9 0.1 - 1.0 K/uL  Eosinophils Relative 0 %   Eosinophils Absolute 0.0 0.0 - 0.5 K/uL   Basophils Relative 0 %   Basophils Absolute 0.0 0.0 - 0.1 K/uL   WBC Morphology MORPHOLOGY UNREMARKABLE    RBC Morphology MORPHOLOGY UNREMARKABLE    Smear Review MORPHOLOGY UNREMARKABLE    Abs Immature Granulocytes 0.00 0.00 - 0.07 K/uL    Comment: Performed at [facility], [address], [place], [place] [address]  Ethanol     Status: None   Collection Time: 02/10/22  2:11 AM  Result Value Ref Range   Alcohol, Ethyl (B) <10 <10 mg/dL    Comment: (NOTE) Lowest detectable limit for serum alcohol is 10 mg/dL.  For medical purposes only. Performed at  [facility], [place], [place] [address]   Troponin I (High Sensitivity)     Status: Abnormal   Collection Time: 02/10/22  4:01 AM  Result Value Ref Range   Troponin I (High Sensitivity) 20 (H) <18 ng/L    Comment: (NOTE) Elevated high sensitivity troponin I (hsTnI) values and significant  changes across serial measurements may suggest ACS but many other  chronic and acute conditions are known to elevate hsTnI results.  Refer to the "Links" section for chest pain algorithms and additional  guidance. Performed at [facility], [place], [place] [address]   Troponin I (High Sensitivity)     Status: Abnormal   Collection Time: 02/10/22  5:38 AM  Result Value Ref Range   Troponin I (High Sensitivity) 29 (H) <18 ng/L    Comment: (NOTE) Elevated high sensitivity troponin I (hsTnI) values and significant  changes across serial measurements may suggest ACS but many other  chronic and acute conditions are known to elevate hsTnI results.  Refer to the "Links" section for chest pain algorithms and additional  guidance. Performed at [facility], [address] [place], [place] [address]   APTT     Status: None   Collection Time: 02/10/22  6:51 AM  Result Value Ref Range   aPTT 32 24 - 36 seconds    Comment: Performed at [facility], [address] [place], [place] [address]  Protime-INR     Status: None   Collection Time: 02/10/22  6:51 AM  Result Value Ref Range   Prothrombin Time 13.1 11.4 - 15.2 seconds   INR 1.0 0.8 - 1.2    Comment: (NOTE) INR goal varies based on device and disease states. Performed at [facility], [place], [place] [address]   Troponin I (High Sensitivity)     Status: Abnormal   Collection Time: 02/10/22  7:21 AM  Result Value Ref Range   Troponin I (High Sensitivity) 25 (H) <18 ng/L    Comment: (NOTE) Elevated high sensitivity troponin I (hsTnI) values and  significant  changes across serial measurements may suggest ACS but many other  chronic and acute conditions are known to elevate hsTnI results.  Refer to the "Links" section for chest pain algorithms and additional  guidance. Performed at [facility], [address] [place], [place] [address]   Resp Panel by RT-PCR (Flu A&B, Covid) Nasopharyngeal Swab     Status: None   Collection Time: 02/10/22  9:25 AM   Specimen: Nasopharyngeal Swab; Nasopharyngeal(NP) swabs in vial transport medium  Result Value Ref Range   SARS Coronavirus 2 by RT PCR NEGATIVE NEGATIVE    Comment: (NOTE) SARS-CoV-2 target nucleic acids are NOT DETECTED.  The SARS-CoV-2 RNA is generally detectable in upper respiratory specimens during the acute phase of infection. The lowest concentration of SARS-CoV-2 viral copies this assay can detect is 138 copies/mL. A negative result does not preclude SARS-Cov-2 infection and should not be used as the sole basis for treatment or other patient management decisions. A negative result may occur with  improper specimen collection/handling, submission of specimen other than nasopharyngeal swab, presence of viral mutation(s) within the areas targeted by this assay, and inadequate number of viral copies(<138 copies/mL). A negative result must be combined with clinical observations, patient history, and epidemiological information. The expected result is Negative.  Fact Sheet for Patients:  EntrepreneurPulse.com.au  Fact Sheet for Healthcare Providers:  IncredibleEmployment.be  This test is no t yet approved or cleared by the [place] FDA and  has been authorized for detection and/or diagnosis of SARS-CoV-2 by FDA under an Emergency Use Authorization (EUA). This EUA will remain  in effect (meaning this test can be used) for the duration of the COVID-19 declaration under Section 564(b)(1) of the Act, 21 U.S.C.section  360bbb-3(b)(1), unless the authorization is terminated  or revoked sooner.       Influenza A by PCR NEGATIVE NEGATIVE   Influenza B by PCR NEGATIVE NEGATIVE    Comment: (NOTE) The Xpert Xpress SARS-CoV-2/FLU/RSV plus assay is intended as an aid in the diagnosis of influenza from Nasopharyngeal swab specimens and should not be used as a sole basis for treatment. Nasal washings and aspirates are unacceptable for Xpert Xpress SARS-CoV-2/FLU/RSV testing.  Fact Sheet for Patients: EntrepreneurPulse.com.au  Fact Sheet for Healthcare Providers: IncredibleEmployment.be  This test is not yet approved or cleared by the [place] FDA and has been authorized for detection and/or diagnosis of SARS-CoV-2 by FDA under an Emergency Use Authorization (EUA). This EUA will remain in effect (meaning this test can be used) for the duration of the COVID-19 declaration under Section 564(b)(1) of the Act, 21 U.S.C. section 360bbb-3(b)(1), unless the authorization is terminated or revoked.  Performed at [facility], [address] [place], [place] [address]     Current Facility-Administered Medications  Medication Dose Route Frequency Provider Last Rate Last Admin   acetaminophen (TYLENOL) tablet 650 mg  650 mg Oral Q6H PRN Ivor Costa, MD   650 mg at 02/10/22 1226   amLODipine (NORVASC) tablet 5 mg  5 mg Oral Daily Ivor Costa, MD   5 mg at 02/10/22 0953   [START ON 02/11/2022] aspirin EC tablet 81 mg  81 mg Oral Daily Ivor Costa, MD       divalproex (DEPAKOTE) DR tablet 500 mg  500 mg Oral Q12H Ivor Costa, MD   500 mg at 02/10/22 0953   enoxaparin (LOVENOX) injection 52.5 mg  0.5 mg/kg Subcutaneous Q24H Vira Blanco, RPH       folic acid (FOLVITE) tablet 1 mg  1 mg Oral Daily Ivor Costa, MD   1 mg at 02/10/22 [id number]   gabapentin (NEURONTIN) capsule 300 mg  300 mg Oral TID Ivor Costa, MD   300 mg at 02/10/22 1457   haloperidol (HALDOL) tablet 2 mg  2  mg Oral BID Ivor Costa, MD   2 mg at 02/10/22 1004   haloperidol (HALDOL) tablet 2 mg  2 mg Oral Q6H PRN Abdulmalik Darco, Madie Reno, MD       Or   haloperidol lactate (HALDOL) injection 2 mg  2 mg Intravenous Q6H PRN Zulma Court, Madie Reno, MD  hydrALAZINE (APRESOLINE) injection 5 mg  5 mg Intravenous Q2H PRN Ivor Costa, MD       LORazepam (ATIVAN) injection 0-4 mg  0-4 mg Intravenous Q6H Ivor Costa, MD   1 mg at 02/10/22 [id number]   Followed by   Derrill Memo ON 02/12/2022] LORazepam (ATIVAN) injection 0-4 mg  0-4 mg Intravenous Q12H Ivor Costa, MD       LORazepam (ATIVAN) tablet 1-4 mg  1-4 mg Oral Q1H PRN Ivor Costa, MD       Or   LORazepam (ATIVAN) injection 1-4 mg  1-4 mg Intravenous Q1H PRN Ivor Costa, MD   3 mg at 02/10/22 1457   LORazepam (ATIVAN) tablet 2 mg  2 mg Oral Q6H PRN Ulices Maack, Madie Reno, MD       multivitamin with minerals tablet 1 tablet  1 tablet Oral Daily Ivor Costa, MD   1 tablet at 02/10/22 [id number]   nicotine (NICODERM CQ - dosed in mg/24 hours) patch 21 mg  21 mg Transdermal Daily Ivor Costa, MD   21 mg at 02/10/22 0952   ondansetron (ZOFRAN) injection 4 mg  4 mg Intravenous Q8H PRN Ivor Costa, MD       pantoprazole (PROTONIX) EC tablet 80 mg  80 mg Oral Daily Ivor Costa, MD   80 mg at 02/10/22 [id number]   thiamine tablet 100 mg  100 mg Oral Daily Ivor Costa, MD   100 mg at 02/10/22 [id number]   Or   thiamine (B-1) injection 100 mg  100 mg Intravenous Daily Ivor Costa, MD        Musculoskeletal: Strength & Muscle Tone: within normal limits Gait & Station: normal Patient leans: N/A            Psychiatric Specialty Exam:  Presentation  General Appearance: Appropriate for Environment  Eye Contact:Fair  Speech:Clear and Coherent; Slow  Speech Volume:Normal  Handedness:Right   Mood and Affect  Mood:Dysphoric  Affect:Blunt   Thought Process  Thought Processes:Disorganized  Descriptions of Associations:Loose  Orientation:Full (Time, Place and Person)  Thought Content:Delusions;  Scattered; Paranoid Ideation  History of Schizophrenia/Schizoaffective disorder:Yes  Duration of Psychotic Symptoms:Greater than six months  Hallucinations:Hallucinations: None (Denies)  Ideas of Reference:Paranoia  Suicidal Thoughts:Suicidal Thoughts: No  Homicidal Thoughts:Homicidal Thoughts: No   Sensorium  Memory:Immediate Poor  Judgment:Poor  Insight:Lacking   Executive Functions  Concentration:Fair  Attention Span:Fair  [place]  Language:Poor   Psychomotor Activity  Psychomotor Activity:Psychomotor Activity: Normal   Assets  Assets:Resilience   Sleep  Sleep:Sleep: Fair   Physical Exam: Physical Exam Vitals and nursing note reviewed.  Constitutional:      Appearance: Normal appearance.  HENT:     Head: Normocephalic and atraumatic.     Mouth/Throat:     Pharynx: Oropharynx is clear.  Eyes:     Pupils: Pupils are equal, round, and reactive to light.  Cardiovascular:     Rate and Rhythm: Normal rate and regular rhythm.  Pulmonary:     Effort: Pulmonary effort is normal.     Breath sounds: Normal breath sounds.  Abdominal:     General: Abdomen is flat.     Palpations: Abdomen is soft.  Musculoskeletal:        General: Normal range of motion.  Skin:    General: Skin is warm and dry.  Neurological:     General: No focal deficit present.     Mental Status: He is alert. Mental status is at baseline.  Psychiatric:  Attention and Perception: He is inattentive.        Mood and Affect: Mood is anxious.        Speech: Speech is tangential.        Behavior: Behavior is agitated. Behavior is not aggressive.        Thought Content: Thought content is paranoid and delusional.        Cognition and Memory: Memory is impaired.        Judgment: Judgment is impulsive.   Review of Systems  Constitutional: Negative.   HENT: Negative.    Eyes: Negative.   Respiratory: Negative.    Cardiovascular: Negative.    Gastrointestinal: Negative.   Musculoskeletal: Negative.   Skin: Negative.   Neurological: Negative.   Psychiatric/Behavioral:  Positive for hallucinations, memory loss and substance abuse. Negative for depression and suicidal ideas. The patient is nervous/anxious and has insomnia.   Blood pressure (!) 117/92, pulse (!) 110, temperature 98.3 F (36.8 C), resp. rate 20, weight 102.6 kg, SpO2 100 %. Body mass index is 32.46 kg/m.  Treatment Plan Summary: Medication management and Plan I have put in orders to get him Ativan and Haldol at slightly bigger doses and to be administered as needed for anxiety rather than just for the CIWA scale.  He may or may not meet criteria on that scale because I do not think this is probably from alcohol withdrawal.  He usually is not a big drinker and there was no report of recent alcohol abuse.  I am going to review with the psychiatric staff and the hospitalist as to whether the patient would be medically appropriate to come to psychiatry or not.  In either case we will follow up with him closely.  Disposition: Recommend psychiatric Inpatient admission when medically cleared.  Alethia Berthold, MD 02/10/2022 4:14 PM

## 2022-02-10 NOTE — Progress Notes (Signed)
ANTICOAGULATION CONSULT NOTE   Pharmacy Consult for heparin infusion Indication: ACS/STEMI  Allergies  Allergen Reactions   Peanut-Containing Drug Products Diarrhea, Anaphylaxis and Swelling   Cheese Diarrhea   Eggs Or Egg-Derived Products Diarrhea    Patient Measurements: Weight: 102.6 kg (226 lb 3.1 oz) Heparin Dosing Weight: 94.7 kg  Vital Signs: Temp: 98.9 F (37.2 C) (02/13 0246) Temp Source: Oral (02/13 0246) BP: 133/79 (02/13 [id number]) Pulse Rate: 130 (02/13 0633)  Labs: Recent Labs    02/10/22 0211 02/10/22 0401 02/10/22 0538  HGB 15.6  --   --   HCT 48.3  --   --   PLT 280  --   --   CREATININE 1.42*  --   --   TROPONINIHS 12 20* 29*    Estimated Creatinine Clearance: 85.4 mL/min (A) (by C-G formula based on SCr of 1.42 mg/dL (H)).   Medical History: Past Medical History:  Diagnosis Date   Drug dependence ([place])    GI bleed    Hiatal hernia    Hypertension    PTSD (post-traumatic stress disorder)     Assessment: Pt is 36 yo male presenting to ED via EMS for having CP and "feeling anxious" found with slightly elevated Troponin I lvl, trending up being started on heparin while working to rule out ACS.  Per NP, UDS positive for marijuana and amphetamines & "patient exhibits psychosis, and paranoia."  Goal of Therapy:  Heparin level 0.3-0.7 units/ml Monitor platelets by anticoagulation protocol: Yes   Plan:  Bolus 4000 units x 1 Start heparin infusion at 1350 units/hr Will check HL in 6 hr after start of infusion CBC daily while on heparin.  Renda Rolls, PharmD, MBA 02/10/2022 7:00 AM

## 2022-02-10 NOTE — Progress Notes (Signed)
Report called to behavioral health. IV access discontinued. Pt awake alert and oriented to person, time and place. eating dinner. No concerns voiced.

## 2022-02-10 NOTE — ED Triage Notes (Signed)
Called EMS  to take him to hospital for feeling anxious. Pt reported 'does not want to feel worse and just want to check it out" . Per report , Pt recently seen here for CP. Pt presents to ED AAOx4, respi even-unlabored. Calm & cooperative

## 2022-02-10 NOTE — Progress Notes (Signed)
Patient being discharged to [facility]

## 2022-02-10 NOTE — ED Notes (Signed)
Pt taken to CT at this time.

## 2022-02-10 NOTE — Tx Team (Signed)
Initial Treatment Plan 02/10/2022 11:25 PM Jeremy Frank [id number]    PATIENT STRESSORS: Medication change or noncompliance   Substance abuse     PATIENT STRENGTHS: Communication skills  General fund of knowledge    PATIENT IDENTIFIED PROBLEMS: Psychosis  Substance abuse disorder                   DISCHARGE CRITERIA:  Improved stabilization in mood, thinking, and/or behavior  PRELIMINARY DISCHARGE PLAN: Outpatient therapy  PATIENT/FAMILY INVOLVEMENT: This treatment plan has been presented to and reviewed with the patient, Jeremy Frank, and/or family member, .  The patient and family have been given the opportunity to ask questions and make suggestions.  Floyde Parkins, RN 02/10/2022, 11:25 PM

## 2022-02-10 NOTE — Consult Note (Signed)
[facility] Consult   Reason for Consult:  Psych Evaluation Referring Physician:  Dr. 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 old male patient at [facility] by this provider.  Chart reviewed and discussed with Dr. Alfred Levins on 02/10/22.     On evaluation Jeremy Frank reports he is here because he was having chest pains and feeling anxious.  At this time he is stating that he does not want to die.  He is  paranoid anf constantly looking past this provider asking if he is safe.  Staff reassures him that he is safe.  He denies wanting to hurt himself. His UDS is positive for marijuana and amphetamines.  He is familiar with this ER and usually presents with similar presentation. His symptoms usually resolves in the am and patient request discharge.   During evaluation Jeremy Frank is alert/oriented x 3 he is anxious and paranoid: and mood is congruent with affect.  He does not appear to be responding to internal/external stimuli. His thought process is irrational and /or delusional.  Patient exhibits psychosis, and paranoia.    Recommendations:  Reassess in the am to check for baseline after substances metabolizes.    Past Psychiatric History: PTSD, Schizophrenia and substance abuse  Risk to Self:   Risk to Others:   Prior Inpatient Therapy:   Prior Outpatient Therapy:    Past Medical History:  Past Medical History:  Diagnosis Date   Drug dependence ([place])    GI bleed    Hiatal hernia    Hypertension    PTSD (post-traumatic stress disorder)     Past Surgical History:  Procedure Laterality Date   KNEE  SURGERY     Family History: No family history on file. Family Psychiatric  History: unknown Social History:  Social History   Substance and Sexual Activity  Alcohol Use Yes   Alcohol/week: 42.0 standard drinks   Types: 42 Cans of beer per week   Comment: per pt he drinks 6-12 cans of beer daily     Social History   Substance and Sexual Activity  Drug Use Yes   Types: Cocaine, Marijuana    Social History   Socioeconomic History   Marital status: Single    Spouse name: Not on file   Number of children: Not on file   Years of education: Not on file   Highest education level: Not on file  Occupational History   Not on file  Tobacco Use   Smoking status: Every Day    Packs/day: 0.25    Types: Cigarettes   Smokeless tobacco: Never  Vaping Use   Vaping Use: Never used  Substance and Sexual Activity   Alcohol use: Yes    Alcohol/week: 42.0 standard drinks    Types: 42 Cans of beer per week    Comment: per pt he drinks 6-12 cans of beer daily   Drug use: Yes    Types: Cocaine, Marijuana   Sexual activity: Not Currently  Other Topics Concern   Not on file  Social History Narrative   Not on file   Social Determinants of Health   Financial Resource Strain: Not on  file  Food Insecurity: Not on file  Transportation Needs: Not on file  Physical Activity: Not on file  Stress: Not on file  Social Connections: Not on file   Additional Social History:    Allergies:   Allergies  Allergen Reactions   Peanut-Containing Drug Products Diarrhea, Anaphylaxis and Swelling   Cheese Diarrhea   Eggs Or Egg-Derived Products Diarrhea    Labs:  Results for orders placed or performed during the hospital encounter of 02/10/22 (from the past 48 hour(s))  Urine Drug Screen, Qualitative ([facility] only)     Status: Abnormal   Collection Time: 02/10/22  2:07 AM  Result Value Ref Range   Tricyclic, Ur Screen NONE DETECTED NONE DETECTED   Amphetamines, Ur Screen POSITIVE (A) NONE  DETECTED   MDMA (Ecstasy)Ur Screen NONE DETECTED NONE DETECTED   Cocaine Metabolite,Ur [place] NONE DETECTED NONE DETECTED   Opiate, Ur Screen NONE DETECTED NONE DETECTED   Phencyclidine (PCP) Ur S NONE DETECTED NONE DETECTED   Cannabinoid 50 Ng, Ur [place] POSITIVE (A) NONE DETECTED   Barbiturates, Ur Screen NONE DETECTED NONE DETECTED   Benzodiazepine, Ur Scrn NONE DETECTED NONE DETECTED   Methadone Scn, Ur NONE DETECTED NONE DETECTED    Comment: (NOTE) Tricyclics + metabolites, urine    Cutoff 1000 ng/mL Amphetamines + metabolites, urine  Cutoff 1000 ng/mL MDMA (Ecstasy), urine              Cutoff 500 ng/mL Cocaine Metabolite, urine          Cutoff 300 ng/mL Opiate + metabolites, urine        Cutoff 300 ng/mL Phencyclidine (PCP), urine         Cutoff 25 ng/mL Cannabinoid, urine                 Cutoff 50 ng/mL Barbiturates + metabolites, urine  Cutoff 200 ng/mL Benzodiazepine, urine              Cutoff 200 ng/mL Methadone, urine                   Cutoff 300 ng/mL  The urine drug screen provides only a preliminary, unconfirmed analytical test result and should not be used for non-medical purposes. Clinical consideration and professional judgment should be applied to any positive drug screen result due to possible interfering substances. A more specific alternate chemical method must be used in order to obtain a confirmed analytical result. Gas chromatography / mass spectrometry (GC/MS) is the preferred confirm atory method. Performed at [facility], [place], [place] [address]   Troponin I (High Sensitivity)     Status: None   Collection Time: 02/10/22  2:11 AM  Result Value Ref Range   Troponin I (High Sensitivity) 12 <18 ng/L    Comment: (NOTE) Elevated high sensitivity troponin I (hsTnI) values and significant  changes across serial measurements may suggest ACS but many other  chronic and acute conditions are known to elevate hsTnI results.  Refer to the  "Links" section for chest pain algorithms and additional  guidance. Performed at [facility], [address] [place], [place] [address]   Lipase, blood     Status: None   Collection Time: 02/10/22  2:11 AM  Result Value Ref Range   Lipase 28 11 - 51 U/L    Comment: Performed at [facility], [address], [place], [place] [address]  Comprehensive metabolic panel     Status: Abnormal   Collection  Time: 02/10/22  2:11 AM  Result Value Ref Range   Sodium 134 (L) 135 - 145 mmol/L   Potassium 4.2 3.5 - 5.1 mmol/L   Chloride 98 98 - 111 mmol/L   CO2 25 22 - 32 mmol/L   Glucose, Bld 107 (H) 70 - 99 mg/dL    Comment: Glucose reference range applies only to samples taken after fasting for at least 8 hours.   BUN 16 6 - 20 mg/dL   Creatinine, Ser 1.42 (H) 0.61 - 1.24 mg/dL   Calcium 10.0 8.9 - 10.3 mg/dL   Total Protein 8.6 (H) 6.5 - 8.1 g/dL   Albumin 4.8 3.5 - 5.0 g/dL   AST 29 15 - 41 U/L   ALT 20 0 - 44 U/L   Alkaline Phosphatase 74 38 - 126 U/L   Total Bilirubin 0.9 0.3 - 1.2 mg/dL   GFR, Estimated >60 >60 mL/min    Comment: (NOTE) Calculated using the CKD-EPI Creatinine Equation (2021)    Anion gap 11 5 - 15    Comment: Performed at [facility], [address] [place], [place] [address]  CBC with Differential     Status: None   Collection Time: 02/10/22  2:11 AM  Result Value Ref Range   WBC 10.1 4.0 - 10.5 K/uL    Comment: REPEATED TO VERIFY WHITE COUNT CONFIRMED ON SMEAR    RBC 5.36 4.22 - 5.81 MIL/uL   Hemoglobin 15.6 13.0 - 17.0 g/dL   HCT 48.3 39.0 - 52.0 %   MCV 90.1 80.0 - 100.0 fL   MCH 29.1 26.0 - 34.0 pg   MCHC 32.3 30.0 - 36.0 g/dL   RDW 13.5 11.5 - 15.5 %   Platelets 280 150 - 400 K/uL   nRBC 0.0 0.0 - 0.2 %    Comment: REPEATED TO VERIFY   Neutrophils Relative % 67 %   Neutro Abs 6.8 1.7 - 7.7 K/uL   Lymphocytes Relative 24 %   Lymphs Abs 2.4 0.7 - 4.0 K/uL   Monocytes Relative 9 %   Monocytes Absolute 0.9 0.1 - 1.0  K/uL   Eosinophils Relative 0 %   Eosinophils Absolute 0.0 0.0 - 0.5 K/uL   Basophils Relative 0 %   Basophils Absolute 0.0 0.0 - 0.1 K/uL   WBC Morphology MORPHOLOGY UNREMARKABLE    RBC Morphology MORPHOLOGY UNREMARKABLE    Smear Review MORPHOLOGY UNREMARKABLE    Abs Immature Granulocytes 0.00 0.00 - 0.07 K/uL    Comment: Performed at [facility], [address] [place], [place] [address]  Ethanol     Status: None   Collection Time: 02/10/22  2:11 AM  Result Value Ref Range   Alcohol, Ethyl (B) <10 <10 mg/dL    Comment: (NOTE) Lowest detectable limit for serum alcohol is 10 mg/dL.  For medical purposes only. Performed at [facility], [place], [place] [address]   Troponin I (High Sensitivity)     Status: Abnormal   Collection Time: 02/10/22  4:01 AM  Result Value Ref Range   Troponin I (High Sensitivity) 20 (H) <18 ng/L    Comment: (NOTE) Elevated high sensitivity troponin I (hsTnI) values and significant  changes across serial measurements may suggest ACS but many other  chronic and acute conditions are known to elevate hsTnI results.  Refer to the "Links" section for chest pain algorithms and additional  guidance. Performed at [facility], [address], [place], [place] [address]  No current facility-administered medications for this encounter.   Current Outpatient Medications  Medication Sig Dispense Refill   divalproex (DEPAKOTE) 500 MG DR tablet Take 1 tablet (500 mg total) by mouth every 12 (twelve) hours. 60 tablet 1   gabapentin (NEURONTIN) 300 MG capsule Take 1 capsule (300 mg total) by mouth 3 (three) times daily. 90 capsule 1   haloperidol (HALDOL) 2 MG tablet Take 1 tablet (2 mg total) by mouth 2 (two) times daily. 60 tablet 1   pantoprazole (PROTONIX) 40 MG tablet Take 2 tablets (80 mg total) by mouth daily. 60 tablet 1    Musculoskeletal: Strength & Muscle Tone: within normal limits Gait & Station:  normal Patient leans: N/A            Psychiatric Specialty Exam:  Presentation  General Appearance: Bizarre  Eye Contact:Fleeting  Speech:Clear and Coherent  Speech Volume:Normal  Handedness:Right   Mood and Affect  Mood:Anxious  Affect:Flat   Thought Process  Thought Processes:Disorganized  Descriptions of Associations:Circumstantial  Orientation:Partial  Thought Content:Delusions; Paranoid Ideation  History of Schizophrenia/Schizoaffective disorder:Yes  Duration of Psychotic Symptoms:Greater than six months  Hallucinations:Hallucinations: Other (comment) (unable to assess)  Ideas of Reference:Paranoia  Suicidal Thoughts:Suicidal Thoughts: No  Homicidal Thoughts:Homicidal Thoughts: No   Sensorium  Memory:Immediate Fair; Recent Fair; Remote [place]   Executive Functions  Concentration:Poor  Attention Span:Fair  [place] of Knowledge:Poor  Language:Poor   Psychomotor Activity  Psychomotor Activity:Psychomotor Activity: Normal   Assets  Assets:Desire for Improvement   Sleep  Sleep:Sleep: Fair   Physical Exam: Physical Exam Vitals and nursing note reviewed.  Constitutional:      Appearance: He is well-developed.  HENT:     Head: Normocephalic and atraumatic.  Pulmonary:     Effort: Pulmonary effort is normal.  Musculoskeletal:     Cervical back: Normal range of motion.  Neurological:     Mental Status: He is alert.  Psychiatric:        Attention and Perception: He is inattentive.        Mood and Affect: Mood is anxious. Affect is inappropriate.        Speech: Speech is delayed.        Behavior: Behavior is cooperative.        Thought Content: Thought content is paranoid and delusional.        Cognition and Memory: Cognition is impaired.        Judgment: Judgment is impulsive.   Review of Systems  Psychiatric/Behavioral:  Positive for hallucinations and substance abuse.  The patient is nervous/anxious.   All other systems reviewed and are negative. Blood pressure (!) 165/100, pulse (!) 105, temperature 98.9 F (37.2 C), temperature source Oral, resp. rate 17, SpO2 99 %. There is no height or weight on file to calculate BMI.  Treatment Plan Summary: Plan Zyprexa 5mg  (one time order)  Disposition: No evidence of imminent risk to self or others at present.   Refer to IOP. Discussed crisis plan, support from social network, calling 911, coming to the Emergency Department, and calling Suicide Hotline. Reassess in the am  Deloria Lair, NP 02/10/2022 4:50 AM

## 2022-02-10 NOTE — Consult Note (Signed)
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 male patient admitted with drug-induced psychosis.  HPI:  See previous consult note.  On evaluation today, patient is lying in bed.  He is oriented to person place and time.  He does not appear anxious at this time.  Patient reports that he takes amphetamines to lose weight and for recreational use.  Appears to be calm, cooperative.  He speaks in a slow manner.  Continues to have delusions that are irrational and paranoid.  Irrelevant, disorganized speech.  Patient has difficulty answering questions, tangential in his answers.  He states "my mother uses something for this that the doctors gave her and she needs to adjust that.  I am sorry but you cannot ride my wave."  Patient asked me to repeat questions.  Asked him why he was brought to the hospital and he says "because other people are causing my blood pressure to go up.  I would rather not say who they are."  Patient states that he wants to go back out and use drugs.  Patient clearly still psychotic.  Patient continues to deny any thoughts of self-harm, suicidal.  Denies homicidal ideations.  He denies auditory or visual hallucinations, however he appears to be internally  preoccupied.  Patient is being medically admitted by attending hospitalist.  Attending has added Haldol to patient's scheduled medications.   Past Psychiatric History: PTSD, TBI, Amphetamine abuse; MDD with psychotic features.  Risk to Self:   Risk to Others:   Prior Inpatient Therapy:   Prior Outpatient Therapy:    Past Medical History:  Past Medical History:  Diagnosis Date   Drug dependence ([place])    GI bleed    Hiatal hernia    Hypertension    Migraine    PTSD (post-traumatic stress disorder)     Past Surgical History:  Procedure Laterality Date   KNEE SURGERY     Family History:  Family History  Problem Relation Age of Onset   Heart failure Mother    Hypertension Father    Family Psychiatric  History: Unknown Social History:  Social History   Substance and Sexual Activity  Alcohol Use Yes   Alcohol/week: 42.0 standard drinks   Types: 42 Cans of beer per week   Comment: per pt he drinks 6-12 cans of beer daily     Social History   Substance and Sexual Activity  Drug Use Yes   Types: Cocaine, Marijuana, Amphetamines    Social History   Socioeconomic History   Marital status: Single    Spouse name: Not on file   Number of children: Not on file   Years of education: Not on file   Highest education level: Not on file  Occupational History   Not on file  Tobacco  Use   Smoking status: Every Day    Packs/day: 0.25    Types: Cigarettes   Smokeless tobacco: Never  Vaping Use   Vaping Use: Never used  Substance and Sexual Activity   Alcohol use: Yes    Alcohol/week: 42.0 standard drinks    Types: 42 Cans of beer per week    Comment: per pt he drinks 6-12 cans of beer daily   Drug use: Yes    Types: Cocaine, Marijuana, Amphetamines   Sexual activity: Not Currently  Other Topics Concern   Not on file  Social History Narrative   Not on file   Social Determinants of Health   Financial Resource Strain: Not on file  Food Insecurity: Not on file   Transportation Needs: Not on file  Physical Activity: Not on file  Stress: Not on file  Social Connections: Not on file   Additional Social History:    Allergies:   Allergies  Allergen Reactions   Peanut-Containing Drug Products Diarrhea, Anaphylaxis and Swelling   Cheese Diarrhea   Eggs Or Egg-Derived Products Diarrhea    Labs:  Results for orders placed or performed during the hospital encounter of 02/10/22 (from the past 48 hour(s))  Urine Drug Screen, Qualitative ([facility] only)     Status: Abnormal   Collection Time: 02/10/22  2:07 AM  Result Value Ref Range   Tricyclic, Ur Screen NONE DETECTED NONE DETECTED   Amphetamines, Ur Screen POSITIVE (A) NONE DETECTED   MDMA (Ecstasy)Ur Screen NONE DETECTED NONE DETECTED   Cocaine Metabolite,Ur [place] NONE DETECTED NONE DETECTED   Opiate, Ur Screen NONE DETECTED NONE DETECTED   Phencyclidine (PCP) Ur S NONE DETECTED NONE DETECTED   Cannabinoid 50 Ng, Ur [place] POSITIVE (A) NONE DETECTED   Barbiturates, Ur Screen NONE DETECTED NONE DETECTED   Benzodiazepine, Ur Scrn NONE DETECTED NONE DETECTED   Methadone Scn, Ur NONE DETECTED NONE DETECTED    Comment: (NOTE) Tricyclics + metabolites, urine    Cutoff 1000 ng/mL Amphetamines + metabolites, urine  Cutoff 1000 ng/mL MDMA (Ecstasy), urine              Cutoff 500 ng/mL Cocaine Metabolite, urine          Cutoff 300 ng/mL Opiate + metabolites, urine        Cutoff 300 ng/mL Phencyclidine (PCP), urine         Cutoff 25 ng/mL Cannabinoid, urine                 Cutoff 50 ng/mL Barbiturates + metabolites, urine  Cutoff 200 ng/mL Benzodiazepine, urine              Cutoff 200 ng/mL Methadone, urine                   Cutoff 300 ng/mL  The urine drug screen provides only a preliminary, unconfirmed analytical test result and should not be used for non-medical purposes. Clinical consideration and professional judgment should be applied to any positive drug screen result due to possible interfering  substances. A more specific alternate chemical method must be used in order to obtain a confirmed analytical result. Gas chromatography / mass spectrometry (GC/MS) is the preferred confirm atory method. Performed at [facility], [address] [place], [place] [address]   Troponin I (High Sensitivity)     Status: None   Collection Time: 02/10/22  2:11 AM  Result Value Ref Range   Troponin I (High Sensitivity) 12 <18 ng/L  Comment: (NOTE) Elevated high sensitivity troponin I (hsTnI) values and significant  changes across serial measurements may suggest ACS but many other  chronic and acute conditions are known to elevate hsTnI results.  Refer to the "Links" section for chest pain algorithms and additional  guidance. Performed at [facility], [address] [place], [place] [address]   Lipase, blood     Status: None   Collection Time: 02/10/22  2:11 AM  Result Value Ref Range   Lipase 28 11 - 51 U/L    Comment: Performed at [facility], [address] [place], [place] [address]  Comprehensive metabolic panel     Status: Abnormal   Collection Time: 02/10/22  2:11 AM  Result Value Ref Range   Sodium 134 (L) 135 - 145 mmol/L   Potassium 4.2 3.5 - 5.1 mmol/L   Chloride 98 98 - 111 mmol/L   CO2 25 22 - 32 mmol/L   Glucose, Bld 107 (H) 70 - 99 mg/dL    Comment: Glucose reference range applies only to samples taken after fasting for at least 8 hours.   BUN 16 6 - 20 mg/dL   Creatinine, Ser 1.42 (H) 0.61 - 1.24 mg/dL   Calcium 10.0 8.9 - 10.3 mg/dL   Total Protein 8.6 (H) 6.5 - 8.1 g/dL   Albumin 4.8 3.5 - 5.0 g/dL   AST 29 15 - 41 U/L   ALT 20 0 - 44 U/L   Alkaline Phosphatase 74 38 - 126 U/L   Total Bilirubin 0.9 0.3 - 1.2 mg/dL   GFR, Estimated >60 >60 mL/min    Comment: (NOTE) Calculated using the CKD-EPI Creatinine Equation (2021)    Anion gap 11 5 - 15    Comment: Performed at [facility], [address] [place],  [place] [address]  CBC with Differential     Status: None   Collection Time: 02/10/22  2:11 AM  Result Value Ref Range   WBC 10.1 4.0 - 10.5 K/uL    Comment: REPEATED TO VERIFY WHITE COUNT CONFIRMED ON SMEAR    RBC 5.36 4.22 - 5.81 MIL/uL   Hemoglobin 15.6 13.0 - 17.0 g/dL   HCT 48.3 39.0 - 52.0 %   MCV 90.1 80.0 - 100.0 fL   MCH 29.1 26.0 - 34.0 pg   MCHC 32.3 30.0 - 36.0 g/dL   RDW 13.5 11.5 - 15.5 %   Platelets 280 150 - 400 K/uL   nRBC 0.0 0.0 - 0.2 %    Comment: REPEATED TO VERIFY   Neutrophils Relative % 67 %   Neutro Abs 6.8 1.7 - 7.7 K/uL   Lymphocytes Relative 24 %   Lymphs Abs 2.4 0.7 - 4.0 K/uL   Monocytes Relative 9 %   Monocytes Absolute 0.9 0.1 - 1.0 K/uL   Eosinophils Relative 0 %   Eosinophils Absolute 0.0 0.0 - 0.5 K/uL   Basophils Relative 0 %   Basophils Absolute 0.0 0.0 - 0.1 K/uL   WBC Morphology MORPHOLOGY UNREMARKABLE    RBC Morphology MORPHOLOGY UNREMARKABLE    Smear Review MORPHOLOGY UNREMARKABLE    Abs Immature Granulocytes 0.00 0.00 - 0.07 K/uL    Comment: Performed at [facility], [address] [place], [place] [address]  Ethanol     Status: None   Collection Time: 02/10/22  2:11 AM  Result Value Ref Range   Alcohol, Ethyl (B) <10 <10 mg/dL    Comment: (NOTE) Lowest detectable limit for serum alcohol  is 10 mg/dL.  For medical purposes only. Performed at [facility], [place], [place] [address]   Troponin I (High Sensitivity)     Status: Abnormal   Collection Time: 02/10/22  4:01 AM  Result Value Ref Range   Troponin I (High Sensitivity) 20 (H) <18 ng/L    Comment: (NOTE) Elevated high sensitivity troponin I (hsTnI) values and significant  changes across serial measurements may suggest ACS but many other  chronic and acute conditions are known to elevate hsTnI results.  Refer to the "Links" section for chest pain algorithms and additional  guidance. Performed at [facility], [place], [place] [address]   Troponin I (High Sensitivity)     Status: Abnormal   Collection Time: 02/10/22  5:38 AM  Result Value Ref Range   Troponin I (High Sensitivity) 29 (H) <18 ng/L    Comment: (NOTE) Elevated high sensitivity troponin I (hsTnI) values and significant  changes across serial measurements may suggest ACS but many other  chronic and acute conditions are known to elevate hsTnI results.  Refer to the "Links" section for chest pain algorithms and additional  guidance. Performed at [facility], [address] [place], [place] [address]   APTT     Status: None   Collection Time: 02/10/22  6:51 AM  Result Value Ref Range   aPTT 32 24 - 36 seconds    Comment: Performed at [facility], [address] [place], [place] [address]  Protime-INR     Status: None   Collection Time: 02/10/22  6:51 AM  Result Value Ref Range   Prothrombin Time 13.1 11.4 - 15.2 seconds   INR 1.0 0.8 - 1.2    Comment: (NOTE) INR goal varies based on device and disease states. Performed at [facility], [place], [place] [address]   Troponin I (High Sensitivity)     Status: Abnormal   Collection Time: 02/10/22  7:21 AM  Result Value Ref Range   Troponin I (High Sensitivity) 25 (H) <18 ng/L    Comment: (NOTE) Elevated high sensitivity troponin I (hsTnI) values and significant  changes across serial measurements may suggest ACS but many other  chronic and acute conditions are known to elevate hsTnI results.  Refer to the "Links" section for chest pain algorithms and additional  guidance. Performed at [facility], [address] [place], [place] [address]   Resp Panel by RT-PCR (Flu A&B, Covid) Nasopharyngeal Swab     Status: None   Collection Time: 02/10/22  9:25 AM   Specimen: Nasopharyngeal Swab; Nasopharyngeal(NP) swabs in vial transport medium  Result Value Ref Range   SARS Coronavirus 2 by RT PCR NEGATIVE NEGATIVE    Comment:  (NOTE) SARS-CoV-2 target nucleic acids are NOT DETECTED.  The SARS-CoV-2 RNA is generally detectable in upper respiratory specimens during the acute phase of infection. The lowest concentration of SARS-CoV-2 viral copies this assay can detect is 138 copies/mL. A negative result does not preclude SARS-Cov-2 infection and should not be used as the sole basis for treatment or other patient management decisions. A negative result may occur with  improper specimen collection/handling, submission of specimen other than nasopharyngeal swab, presence of viral mutation(s) within the areas targeted by this assay, and inadequate number of viral copies(<138 copies/mL). A negative result must be combined with clinical observations, patient history, and epidemiological information. The expected result is Negative.  Fact Sheet for Patients:  EntrepreneurPulse.com.au  Fact Sheet for Healthcare Providers:  IncredibleEmployment.be  This test is no t yet approved or cleared by the [place] FDA and  has been authorized for detection and/or diagnosis of SARS-CoV-2 by FDA under an Emergency Use Authorization (EUA). This EUA will remain  in effect (meaning this test can be used) for the duration of the COVID-19 declaration under Section 564(b)(1) of the Act, 21 U.S.C.section 360bbb-3(b)(1), unless the authorization is terminated  or revoked sooner.       Influenza A by PCR NEGATIVE NEGATIVE   Influenza B by PCR NEGATIVE NEGATIVE    Comment: (NOTE) The Xpert Xpress SARS-CoV-2/FLU/RSV plus assay is intended as an aid in the diagnosis of influenza from Nasopharyngeal swab specimens and should not be used as a sole basis for treatment. Nasal washings and aspirates are unacceptable for Xpert Xpress SARS-CoV-2/FLU/RSV testing.  Fact Sheet for Patients: EntrepreneurPulse.com.au  Fact Sheet for Healthcare  Providers: IncredibleEmployment.be  This test is not yet approved or cleared by the [place] FDA and has been authorized for detection and/or diagnosis of SARS-CoV-2 by FDA under an Emergency Use Authorization (EUA). This EUA will remain in effect (meaning this test can be used) for the duration of the COVID-19 declaration under Section 564(b)(1) of the Act, 21 U.S.C. section 360bbb-3(b)(1), unless the authorization is terminated or revoked.  Performed at [facility], [address] [place], [place] [address]     Current Facility-Administered Medications  Medication Dose Route Frequency Provider Last Rate Last Admin   acetaminophen (TYLENOL) tablet 650 mg  650 mg Oral Q6H PRN Ivor Costa, MD       amLODipine (NORVASC) tablet 5 mg  5 mg Oral Daily Ivor Costa, MD   5 mg at 02/10/22 0953   [START ON 02/11/2022] aspirin EC tablet 81 mg  81 mg Oral Daily Ivor Costa, MD       divalproex (DEPAKOTE) DR tablet 500 mg  500 mg Oral Q12H Ivor Costa, MD   500 mg at 02/10/22 0953   enoxaparin (LOVENOX) injection 52.5 mg  0.5 mg/kg Subcutaneous Q24H Vira Blanco, RPH       folic acid (FOLVITE) tablet 1 mg  1 mg Oral Daily Ivor Costa, MD   1 mg at 02/10/22 [id number]   gabapentin (NEURONTIN) capsule 300 mg  300 mg Oral TID Ivor Costa, MD   300 mg at 02/10/22 [id number]   haloperidol (HALDOL) tablet 2 mg  2 mg Oral BID Ivor Costa, MD   2 mg at 02/10/22 1004   hydrALAZINE (APRESOLINE) injection 5 mg  5 mg Intravenous Q2H PRN Ivor Costa, MD       LORazepam (ATIVAN) injection 0-4 mg  0-4 mg Intravenous Q6H Ivor Costa, MD   1 mg at 02/10/22 [id number]   Followed by   Derrill Memo ON 02/12/2022] LORazepam (ATIVAN) injection 0-4 mg  0-4 mg Intravenous Q12H Ivor Costa, MD       LORazepam (ATIVAN) tablet 1-4 mg  1-4 mg Oral Q1H PRN Ivor Costa, MD       Or   LORazepam (ATIVAN) injection 1-4 mg  1-4 mg Intravenous Q1H PRN Ivor Costa, MD       multivitamin with minerals tablet 1 tablet  1 tablet Oral  Daily Ivor Costa, MD   1 tablet at 02/10/22 0953   nicotine (NICODERM CQ - dosed in mg/24 hours) patch 21 mg  21 mg Transdermal Daily Ivor Costa, MD   21 mg at 02/10/22 0952   ondansetron (  ZOFRAN) injection 4 mg  4 mg Intravenous Q8H PRN Ivor Costa, MD       pantoprazole (PROTONIX) EC tablet 80 mg  80 mg Oral Daily Ivor Costa, MD   80 mg at 02/10/22 [id number]   thiamine tablet 100 mg  100 mg Oral Daily Ivor Costa, MD   100 mg at 02/10/22 [id number]   Or   thiamine (B-1) injection 100 mg  100 mg Intravenous Daily Ivor Costa, MD        Musculoskeletal: Strength & Muscle Tone: within normal limits Gait & Station:  Did not assess Patient leans: N/A   Psychiatric Specialty Exam:  Presentation  General Appearance: Appropriate for Environment  Eye Contact:Fair  Speech:Clear and Coherent; Slow  Speech Volume:Normal  Handedness:Right   Mood and Affect  Mood:Dysphoric  Affect:Blunt   Thought Process  Thought Processes:Disorganized  Descriptions of Associations:Loose  Orientation:Full (Time, Place and Person)  Thought Content:Delusions; Scattered; Paranoid Ideation  History of Schizophrenia/Schizoaffective disorder:Yes  Duration of Psychotic Symptoms:Greater than six months  Hallucinations:Hallucinations: None (Denies)  Ideas of Reference:Paranoia  Suicidal Thoughts:Suicidal Thoughts: No  Homicidal Thoughts:Homicidal Thoughts: No   Sensorium  Memory:Immediate Poor  Judgment:Poor  Insight:Lacking   Executive Functions  Concentration:Fair  Attention Span:Fair  [place]  Language:Poor   Psychomotor Activity  Psychomotor Activity:Psychomotor Activity: Normal   Assets  Assets:Resilience   Sleep  Sleep:Sleep: Fair   Physical Exam: Physical Exam Vitals and nursing note reviewed.  HENT:     Head: Normocephalic.     Nose: No congestion or rhinorrhea.  Eyes:     General:        Right eye: No discharge.        Left eye: No  discharge.  Cardiovascular:     Comments: Hypertension Musculoskeletal:        General: Normal range of motion.     Cervical back: Normal range of motion.  Skin:    General: Skin is dry.  Neurological:     Mental Status: He is alert and oriented to person, place, and time.  Psychiatric:        Attention and Perception: He is inattentive.        Mood and Affect: Affect is blunt.        Speech: Speech is delayed.        Behavior: Behavior is cooperative.        Thought Content: Thought content is paranoid and delusional. Thought content does not include homicidal or suicidal ideation.   Review of Systems  Psychiatric/Behavioral:  Positive for depression, hallucinations (Delusional) and substance abuse. Negative for memory loss and suicidal ideas. The patient is not nervous/anxious and does not have insomnia.   All other systems reviewed and are negative. Blood pressure (!) 157/110, pulse (!) 104, temperature 98.3 F (36.8 C), resp. rate 20, weight 102.6 kg, SpO2 100 %. Body mass index is 32.46 kg/m.  Treatment Plan Summary: Daily contact with patient to assess and evaluate symptoms and progress in treatment and Plan patient to be admitted to medical floor. . Patient can be referred to substance use rehabilitation services, as he has been in the past.  Disposition: Supportive therapy provided about ongoing stressors.  Sherlon Handing, NP 02/10/2022 10:57 AM

## 2022-02-11 ENCOUNTER — Encounter: Payer: Self-pay | Admitting: Psychiatry

## 2022-02-11 DIAGNOSIS — F15951 Other stimulant use, unspecified with stimulant-induced psychotic disorder with hallucinations: Secondary | ICD-10-CM | POA: Diagnosis not present

## 2022-02-11 LAB — LIPID PANEL
Cholesterol: 165 mg/dL (ref 0–200)
HDL: 47 mg/dL (ref >40)
LDL Cholesterol: 102 mg/dL — ABNORMAL HIGH (ref 0–99)
Total CHOL/HDL Ratio: 3.5 RATIO
Triglycerides: 81 mg/dL (ref <150)
VLDL: 16 mg/dL (ref 0–40)

## 2022-02-11 LAB — HEMOGLOBIN A1C
Hgb A1c MFr Bld: 5.1 % (ref 4.8–5.6)
Mean Plasma Glucose: 99.67 mg/dL

## 2022-02-11 MED ORDER — OLANZAPINE 5 MG PO TABS
5.0000 mg | ORAL_TABLET | Freq: Every day | ORAL | Status: DC
  Administered 2022-02-11: 5 mg via ORAL
  Filled 2022-02-11: qty 1

## 2022-02-11 NOTE — Progress Notes (Addendum)
Patient admitted to unit for substance abuse and psychosis. Patient poor historian, forwarding minimal. Patient reports hearing voices and suicidal ideations. Reports increased depression. Patient escorted to room, reports sleepy and would like to go to bed. No skin issues noted, no contraband found. Patient remains saf3e on unit with q 15 min checks. Writer inquired about elevated troponin level, per nurse giving report they were not re-drawing labs, patient has been medically cleared to come down.

## 2022-02-11 NOTE — Progress Notes (Signed)
Recreation Therapy Notes   Date: 02/11/2022  Time: 10:30 am    Location: Courtyard       Behavioral response: N/A   Intervention Topic: Wellness     Discussion/Intervention: Patient refused to attend group.   Clinical Observations/Feedback:  Patient refused to attend group.    Shaconda Hajduk LRT/CTRS        Deklyn Trachtenberg 02/11/2022 12:11 PM

## 2022-02-11 NOTE — Progress Notes (Signed)
Pt is reclusive to his room, denied SI/HI/AVH or self harm thoughts or intent. He reported that he had just woken up from a "nightmare." He stated that he feels "tired and wasted." He came out for his HS meds with much prompting and he ate his snacks in the dayroom. No unsafe behavior noted thus far. Q15 min safety checks maintained for safety and support provided as needed.

## 2022-02-11 NOTE — BHH Suicide Risk Assessment (Signed)
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  Patient known from previous encounters.  36 year old man with history of PTSD and traumatic brain injury who has a chronic problem with stimulant abuse.  Becomes very psychotic when abusing stimulants.  On interview today the patient is calm denies any suicidal or homicidal thought.  Still has some paranoid memories of what happened that brought him into the hospital but has not been acting out or paranoid here in the hospital.  He is able to tell me about his recent treatment at the [facility]  Continued Clinical Symptoms:  Alcohol Use Disorder Identification Test Final Score (AUDIT): 16 The "Alcohol Use Disorders Identification Test", Guidelines for Use in Primary Care, Second Edition.  World Pharmacologist [facility]). Score between 0-7:  no or low risk or alcohol related problems. Score between 8-15:  moderate risk of alcohol related problems. Score between 16-19:  high risk of alcohol related problems. Score 20 or above:  warrants further diagnostic evaluation for alcohol dependence and treatment.   CLINICAL FACTORS:   Alcohol/Substance Abuse/Dependencies Medical Diagnoses and Treatments/Surgeries   Musculoskeletal: Strength & Muscle Tone: within normal  limits Gait & Station: normal Patient leans: N/A  Psychiatric Specialty Exam:  Presentation  General Appearance: Appropriate for Environment  Eye Contact:Fair  Speech:Clear and Coherent; Slow  Speech Volume:Normal  Handedness:Right   Mood and Affect  Mood:Dysphoric  Affect:Blunt   Thought Process  Thought Processes:Disorganized  Descriptions of Associations:Loose  Orientation:Full (Time, Place and Person)  Thought Content:Delusions; Scattered; Paranoid Ideation  History of Schizophrenia/Schizoaffective disorder:Yes  Duration of Psychotic Symptoms:Greater than six months  Hallucinations:Hallucinations: None (Denies)  Ideas of Reference:Paranoia  Suicidal Thoughts:Suicidal Thoughts: No  Homicidal Thoughts:Homicidal Thoughts: No   Sensorium  Memory:Immediate Poor  Judgment:Poor  Insight:Lacking   Executive Functions  Concentration:Fair  Attention Span:Fair  [place]  Language:Poor   Psychomotor Activity  Psychomotor Activity:Psychomotor Activity: Normal   Assets  Assets:Resilience   Sleep  Sleep:Sleep: Fair    Physical Exam: Physical Exam Vitals and nursing note reviewed.  Constitutional:      Appearance: Normal appearance.  HENT:     Head: Normocephalic and atraumatic.     Mouth/Throat:     Pharynx: Oropharynx is clear.  Eyes:     Pupils: Pupils are equal, round, and reactive to light.  Cardiovascular:     Rate and Rhythm: Normal rate and regular rhythm.  Pulmonary:     Effort: Pulmonary effort is normal.     Breath sounds: Normal breath sounds.  Abdominal:     General: Abdomen is flat.     Palpations: Abdomen is soft.  Musculoskeletal:        General: Normal range of motion.  Skin:    General: Skin is warm and dry.  Neurological:     General: No focal deficit present.     Mental Status: He is alert. Mental status  is at baseline.  Psychiatric:        Attention and Perception: Attention  normal.        Mood and Affect: Mood normal. Affect is blunt.        Speech: Speech is delayed.        Behavior: Behavior is slowed.        Thought Content: Thought content is paranoid. Thought content does not include homicidal or suicidal ideation.        Cognition and Memory: Memory is impaired.        Judgment: Judgment is impulsive.   Review of Systems  Constitutional: Negative.   HENT: Negative.    Eyes: Negative.   Respiratory: Negative.    Cardiovascular: Negative.   Gastrointestinal: Negative.   Musculoskeletal: Negative.   Skin: Negative.   Neurological: Negative.   Psychiatric/Behavioral:  Positive for hallucinations, memory loss and substance abuse. Negative for depression and suicidal ideas. The patient is nervous/anxious.   Blood pressure (!) 101/58, pulse 94, temperature 97.9 F (36.6 C), temperature source Oral, resp. rate 18, height 5\' 11"  (1.803 m), weight 102.5 kg, SpO2 100 %. Body mass index is 31.52 kg/m.   COGNITIVE FEATURES THAT CONTRIBUTE TO RISK:  Loss of executive function    SUICIDE RISK:   Minimal: No identifiable suicidal ideation.  Patients presenting with no risk factors but with morbid ruminations; may be classified as minimal risk based on the severity of the depressive symptoms  PLAN OF CARE: Reviewed with patient's what medicine he was taking at the [place].  I will adjust his medicine accordingly.  Reviewed the importance of staying off of stimulants.  Continue 15-minute checks.  Labs reviewed.  Most likely ready for discharge by tomorrow  I certify that inpatient services furnished can reasonably be expected to improve the patient's condition.   Alethia Berthold, MD 02/11/2022, 1:56 PM

## 2022-02-11 NOTE — Plan of Care (Signed)
Patient stayed in bed most of the shift except for meals. Patient states " just feel tired. Otherwise I am ok."  Denies SI,HI and AVH. No aggressive behaviors noted. Compliant with medications. Appetite good. Support and encouragement given.

## 2022-02-11 NOTE — H&P (Signed)
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  36 year old man with a history of intermittent psychotic symptoms.  Transferred to [place] from the medical service where he was agitated paranoid and seeing things.  Patient had presented initially to the emergency room with paranoia and hallucinations in the context of abuse of methamphetamine.  While being observed in the emergency room he had chest pain.  Patient has ruled out for cardiac cause of chest pain and is no longer complaining of any chest discomfort.  Patient says he just got out of the [facility] about a week ago.  He was on his Depakote and Zyprexa.  Also gabapentin.  He admits that he relapsed into abusing methamphetamine.  Cannot remember how much.  He remembers feeling like people were watching him and were trying to hurt him.  He still has some paranoia about that but has not been paranoid here on the psych unit.  Not acting out about it.  Patient denies suicidal or homicidal thought.  Currently he is calm and resting does not appear to be responding to internal stimuli. Associated Signs/Symptoms: Depression Symptoms:  psychomotor agitation, anxiety, Duration of Depression Symptoms: N/A  (Hypo) Manic Symptoms:  Delusions, Distractibility, Hallucinations, Impulsivity, Labiality of Mood, Anxiety Symptoms:  Excessive Worry, Psychotic Symptoms:  Paranoia, PTSD Symptoms: History of military trauma with a previous diagnosis of  PTSD complicated by other issues like his head injury Total Time spent with patient: 1 hour  Past Psychiatric History: Patient has a history of PTSD and head injury and is followed at the [facility].  We have seen him many times present with paranoia hallucinations and psychotic symptoms which usually are entirely in the context of stimulant abuse and tend to resolve within a day.  No history known of any suicide attempts.  Usually pretty calm and cooperative with medicine.  Not entirely clear whether he has a chronic psychotic disorder or if it is all related to substance abuse.  Multiple prior hospitalizations  Is the patient at risk to self? No.  Has the patient been a risk to self in the past 6 months? Yes.    Has the patient been a risk to self within the distant past? Yes.    Is the patient a risk to others? No.  Has the patient been a risk to others in the past 6 months? No.  Has the patient been a risk to others within the distant past? No.   Prior Inpatient Therapy:   Prior Outpatient Therapy:    Alcohol Screening: 1. How often do you have a drink containing alcohol?: 4 or more times a week 2. How many drinks containing alcohol do you have on a typical day when you are drinking?: 5 or 6 3. How often do you have six or more drinks on one occasion?: Weekly AUDIT-C Score: 9 4. How often during the last year have you found that you were not able to stop drinking once you had started?: Weekly 5. How often during the last year have you failed to do what was normally expected from you  because of drinking?: Monthly 6. How often during the last year have you needed a first drink in the morning to get yourself going after a heavy drinking session?: Never 7. How often during the last year have you had a feeling of guilt of remorse after drinking?: Monthly 8. How often during the last year have you been unable to remember what happened the night before because you had been drinking?: Never 9.  Have you or someone else been injured as a result of your drinking?: No 10. Has a relative or friend or a doctor or another health worker been concerned about your drinking or suggested you cut down?: No Alcohol Use Disorder Identification Test Final Score (AUDIT): 16 Alcohol Brief Interventions/Follow-up: Patient Refused Substance Abuse History in the last 12 months:  Yes.   Consequences of Substance Abuse: Methamphetamine induced psychosis Previous Psychotropic Medications: Yes  Psychological Evaluations: Yes  Past Medical History:  Past Medical History:  Diagnosis Date   Drug dependence ([place])    GI bleed    Hiatal hernia    Hypertension    Migraine    PTSD (post-traumatic stress disorder)     Past Surgical History:  Procedure Laterality Date   KNEE SURGERY     Family History:  Family History  Problem Relation Age of Onset   Heart failure Mother    Hypertension Father    Family Psychiatric  History: None reported Tobacco Screening:   Social History:  Social History   Substance and Sexual Activity  Alcohol Use Yes   Alcohol/week: 42.0 standard drinks   Types: 42 Cans of beer per week   Comment: per pt he drinks 6-12 cans of beer daily     Social History   Substance and Sexual Activity  Drug Use Yes   Types: Cocaine, Marijuana, Amphetamines    Additional Social History:                           Allergies:   Allergies  Allergen Reactions   Peanut-Containing Drug Products Diarrhea, Anaphylaxis and Swelling   Cheese Diarrhea   Eggs Or Egg-Derived Products Diarrhea   Lab Results:  Results for orders placed or performed during the hospital encounter of 02/10/22 (from the past 48 hour(s))  Hemoglobin A1c     Status: None   Collection Time: 02/11/22  6:30 AM  Result Value Ref Range   Hgb A1c MFr Bld 5.1 4.8 - 5.6 %    Comment: (NOTE) Pre diabetes:          5.7%-6.4%  Diabetes:              >6.4%  Glycemic control for   <7.0% adults with  diabetes    Mean Plasma Glucose 99.67 mg/dL    Comment: Performed at [facility] [address], [place], [place] [address]  Lipid panel     Status: Abnormal   Collection Time: 02/11/22  6:30 AM  Result Value Ref Range   Cholesterol 165 0 - 200 mg/dL   Triglycerides 81 <150 mg/dL   HDL 47 >40 mg/dL   Total CHOL/HDL Ratio 3.5 RATIO   VLDL 16 0 - 40 mg/dL   LDL Cholesterol 102 (H) 0 - 99 mg/dL    Comment:        Total Cholesterol/HDL:CHD Risk Coronary Heart Disease Risk Table  Men   Women  1/2 Average Risk   3.4   3.3  Average Risk       5.0   4.4  2 X Average Risk   9.6   7.1  3 X Average Risk  23.4   11.0        Use the calculated Patient Ratio above and the CHD Risk Table to determine the patient's CHD Risk.        ATP III CLASSIFICATION (LDL):  <100     mg/dL   Optimal  100-129  mg/dL   Near or Above                    Optimal  130-159  mg/dL   Borderline  160-189  mg/dL   High  >190     mg/dL   Very High Performed at [facility], [address] [place], [place] [address]     Blood Alcohol level:  Lab Results  Component Value Date   [facility] <10 02/10/2022   ETH <10 0000000    Metabolic Disorder Labs:  Lab Results  Component Value Date   HGBA1C 5.1 02/11/2022   MPG 99.67 02/11/2022   MPG 119.76 06/19/2021   No results found for: PROLACTIN Lab Results  Component Value Date   CHOL 165 02/11/2022   TRIG 81 02/11/2022   HDL 47 02/11/2022   CHOLHDL 3.5 02/11/2022   VLDL 16 02/11/2022   LDLCALC 102 (H) 02/11/2022   LDLCALC 72 06/19/2021    Current Medications: Current Facility-Administered Medications  Medication Dose Route Frequency Provider Last Rate Last Admin   acetaminophen (TYLENOL) tablet 650 mg  650 mg Oral Q6H PRN Nesta Kimple, Madie Reno, MD       alum & mag hydroxide-simeth (MAALOX/MYLANTA) 200-200-20 MG/5ML suspension 30 mL  30 mL Oral Q4H PRN Chrishawn Boley, Madie Reno, MD       divalproex (DEPAKOTE) DR tablet 500 mg  500  mg Oral Q12H Jayceion Lisenby, Madie Reno, MD   500 mg at 02/11/22 [id number]   gabapentin (NEURONTIN) capsule 300 mg  300 mg Oral TID Imanni Burdine, Madie Reno, MD   300 mg at 02/11/22 1212   haloperidol (HALDOL) tablet 2 mg  2 mg Oral Q6H PRN Kashden Deboy, Madie Reno, MD       Or   haloperidol lactate (HALDOL) injection 2 mg  2 mg Intramuscular Q6H PRN Thurmond Hildebran, Madie Reno, MD       hydrOXYzine (ATARAX) tablet 50 mg  50 mg Oral Q4H PRN Lorisa Scheid T, MD       magnesium hydroxide (MILK OF MAGNESIA) suspension 30 mL  30 mL Oral Daily PRN Mase Dhondt, Madie Reno, MD       OLANZapine (ZYPREXA) tablet 5 mg  5 mg Oral QHS Kristian Hazzard T, MD       pantoprazole (PROTONIX) EC tablet 80 mg  80 mg Oral Daily Nyema Hachey, Madie Reno, MD   80 mg at 02/11/22 [id number]   traZODone (DESYREL) tablet 100 mg  100 mg Oral QHS PRN Rieley Khalsa, Madie Reno, MD       PTA Medications: Medications Prior to Admission  Medication Sig Dispense Refill Last Dose   divalproex (DEPAKOTE) 500 MG DR tablet Take 1 tablet (500 mg total) by mouth every 12 (twelve) hours. (Patient not taking: Reported on 02/10/2022) 60 tablet 1    gabapentin (NEURONTIN) 300 MG capsule Take 1 capsule (300 mg total) by mouth 3 (three) times daily. (Patient not taking: Reported on 02/10/2022) 90  capsule 1    haloperidol (HALDOL) 2 MG tablet Take 1 tablet (2 mg total) by mouth 2 (two) times daily. (Patient not taking: Reported on 02/10/2022) 60 tablet 1    pantoprazole (PROTONIX) 40 MG tablet Take 2 tablets (80 mg total) by mouth daily. (Patient not taking: Reported on 02/10/2022) 60 tablet 1     Musculoskeletal: Strength & Muscle Tone: within normal limits Gait & Station: normal Patient leans: N/A            Psychiatric Specialty Exam:  Presentation  General Appearance: Appropriate for Environment  Eye Contact:Fair  Speech:Clear and Coherent; Slow  Speech Volume:Normal  Handedness:Right   Mood and Affect  Mood:Dysphoric  Affect:Blunt   Thought Process  Thought  Processes:Disorganized  Duration of Psychotic Symptoms: Greater than six months  Past Diagnosis of Schizophrenia or Psychoactive disorder: Yes  Descriptions of Associations:Loose  Orientation:Full (Time, Place and Person)  Thought Content:Delusions; Scattered; Paranoid Ideation  Hallucinations:Hallucinations: None (Denies)  Ideas of Reference:Paranoia  Suicidal Thoughts:Suicidal Thoughts: No  Homicidal Thoughts:Homicidal Thoughts: No   [place]ensor[place]  Memory:Immediate Poor  Judgment:Poor  Insight:Lacking   Executive Functions  Concentration:Fair  Attention Span:Fair  [place]  Language:Poor   Psychomotor Activity  Psychomotor Activity:Psychomotor Activity: Normal   Assets  Assets:Resilience   Sleep  Sleep:Sleep: Fair    Physical Exam: Physical Exam Vitals and nursing note reviewed.  Constitutional:      Appearance: Normal appearance.  HENT:     Head: Normocephalic and atraumatic.     Mouth/Throat:     Pharynx: Oropharynx is clear.  Eyes:     Pupils: Pupils are equal, round, and reactive to light.  Cardiovascular:     Rate and Rhythm: Normal rate and regular rhythm.  Pulmonary:     Effort: Pulmonary effort is normal.     Breath sounds: Normal breath sounds.  Abdominal:     General: Abdomen is flat.     Palpations: Abdomen is soft.  Musculoskeletal:        General: Normal range of motion.  Skin:    General: Skin is warm and dry.  Neurological:     General: No focal deficit present.     Mental Status: He is alert. Mental status is at baseline.  Psychiatric:        Attention and Perception: He is inattentive.        Mood and Affect: Mood normal. Affect is blunt.        Speech: Speech is delayed.        Behavior: Behavior is slowed.        Thought Content: Thought content is paranoid. Thought content does not include homicidal or suicidal ideation.        Cognition and Memory: Memory is impaired.   Review of  Systems  Constitutional: Negative.   HENT: Negative.    Eyes: Negative.   Respiratory: Negative.    Cardiovascular: Negative.   Gastrointestinal: Negative.   Musculoskeletal: Negative.   Skin: Negative.   Neurological: Negative.   Psychiatric/Behavioral:  Positive for hallucinations, memory loss and substance abuse. Negative for depression and suicidal ideas. The patient is nervous/anxious. The patient does not have insomnia.   Blood pressure (!) 101/58, pulse 94, temperature 97.9 F (36.6 C), temperature source Oral, resp. rate 18, height 5\' 11"  (1.803 m), weight 102.5 kg, SpO2 100 %. Body mass index is 31.52 kg/m.  Treatment Plan Summary: Medication management and Plan adjusting medicines to get rid of the haloperidol and  replace it with olanzapine 5 mg at night which he was taking through the [place].  Observe overnight.  Labs reviewed.  Likely discharge tomorrow.  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[place]) Long Term Goal(s): Improvement in symptoms so as ready for discharge  Short Term Goals: Ability to verbalize feelings will improve and Ability to demonstrate self-control will improve  Physician Treatment Plan for Secondary Diagnosis: Principal Problem:   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    Alethia Berthold, MD 2/14/20231:59 PM

## 2022-02-11 NOTE — Group Note (Signed)
[facility] LCSW Group Therapy Note   Group Date: 02/11/2022 Start Time: 1300 End Time: 1400  Type of Therapy/Topic:  Group Therapy:  Feelings about Diagnosis  Participation Level:  Did Not Attend   Mood: n/a   Description of Group:    This group will allow patients to explore their thoughts and feelings about diagnoses they have received. Patients will be guided to explore their level of understanding and acceptance of these diagnoses. Facilitator will encourage patients to process their thoughts and feelings about the reactions of others to their diagnosis, and will guide patients in identifying ways to discuss their diagnosis with significant others in their lives. This group will be process-oriented, with patients participating in exploration of their own experiences as well as giving and receiving support and challenge from other group members.   Therapeutic Goals: 1. Patient will demonstrate understanding of diagnosis as evidence by identifying two or more symptoms of the disorder:  2. Patient will be able to express two feelings regarding the diagnosis 3. Patient will demonstrate ability to communicate their needs through discussion and/or role plays  Summary of Patient Progress:    Patient did not attend group despite encouraged participation.     Therapeutic Modalities:   Cognitive Behavioral Therapy Brief Therapy Feelings Identification    Durenda Hurt, [place]

## 2022-02-12 DIAGNOSIS — F15951 Other stimulant use, unspecified with stimulant-induced psychotic disorder with hallucinations: Secondary | ICD-10-CM | POA: Diagnosis not present

## 2022-02-12 MED ORDER — GABAPENTIN 300 MG PO CAPS: 300.0000 mg | ORAL_CAPSULE | Freq: Three times a day (TID) | ORAL | 1 refills | Status: AC

## 2022-02-12 MED ORDER — HYDROXYZINE HCL 50 MG PO TABS: 50.0000 mg | ORAL_TABLET | ORAL | 1 refills | Status: DC | PRN

## 2022-02-12 MED ORDER — OLANZAPINE 5 MG PO TABS: 5.0000 mg | ORAL_TABLET | Freq: Every day | ORAL | Status: DC

## 2022-02-12 MED ORDER — PANTOPRAZOLE SODIUM 40 MG PO TBEC: 80.0000 mg | DELAYED_RELEASE_TABLET | Freq: Every day | ORAL | 1 refills | Status: AC

## 2022-02-12 MED ORDER — TRAZODONE HCL 100 MG PO TABS: 100.0000 mg | ORAL_TABLET | Freq: Every evening | ORAL | 1 refills | Status: DC | PRN

## 2022-02-12 MED ORDER — DIVALPROEX SODIUM 500 MG PO DR TAB: 500.0000 mg | DELAYED_RELEASE_TABLET | Freq: Two times a day (BID) | ORAL | 1 refills | Status: DC

## 2022-02-12 NOTE — BHH Suicide Risk Assessment (Signed)
[facility] Discharge Suicide Risk Assessment   Principal Problem: Amphetamine and psychostimulant-induced psychosis with hallucinations ([place]) Discharge Diagnoses: Principal Problem:   Amphetamine and psychostimulant-induced psychosis with hallucinations ([place]) Active Problems:   Psychosis ([place])   Traumatic brain injury ([place])   Cocaine dependence ([place])   Amphetamine abuse ([place])   Total Time spent with patient: 30 minutes  Musculoskeletal: Strength & Muscle Tone: within normal limits Gait & Station: normal Patient leans: N/A  Psychiatric Specialty Exam  Presentation  General Appearance: Appropriate for Environment  Eye Contact:Fair  Speech:Clear and Coherent; Slow  Speech Volume:Normal  Handedness:Right   Mood and Affect  Mood:Dysphoric  Duration of Depression Symptoms: N/A  Affect:Blunt   Thought Process  Thought Processes:Disorganized  Descriptions of Associations:Loose  Orientation:Full (Time, Place and Person)  Thought Content:Delusions; Scattered; Paranoid Ideation  History of Schizophrenia/Schizoaffective disorder:Yes  Duration of Psychotic Symptoms:Greater than six months  Hallucinations:No data recorded Ideas of Reference:Paranoia  Suicidal Thoughts:No data recorded Homicidal Thoughts:No data recorded  Sensorium  Memory:Immediate Poor  Judgment:Poor  Insight:Lacking   Executive Functions  Concentration:Fair  Attention Span:Fair  [place]  Language:Poor   Psychomotor Activity  Psychomotor Activity:No data recorded  Assets  Assets:Resilience   Sleep  Sleep:No data recorded  Physical Exam: Physical Exam Vitals and nursing note reviewed.  Constitutional:      Appearance: Normal appearance.  HENT:     Head: Normocephalic and atraumatic.     Mouth/Throat:     Pharynx: Oropharynx is clear.  Eyes:     Pupils: Pupils are equal, round, and reactive to light.  Cardiovascular:     Rate and Rhythm:  Normal rate and regular rhythm.  Pulmonary:     Effort: Pulmonary effort is normal.     Breath sounds: Normal breath sounds.  Abdominal:     General: Abdomen is flat.     Palpations: Abdomen is soft.  Musculoskeletal:        General: Normal range of motion.  Skin:    General: Skin is warm and dry.  Neurological:     General: No focal deficit present.     Mental Status: He is alert. Mental status is at baseline.  Psychiatric:        Attention and Perception: Attention normal.        Mood and Affect: Mood normal. Affect is blunt.        Speech: Speech normal.        Behavior: Behavior is cooperative.        Thought Content: Thought content normal.        Cognition and Memory: Cognition normal.        Judgment: Judgment normal.   Review of Systems  Constitutional: Negative.   HENT: Negative.    Eyes: Negative.   Respiratory: Negative.    Cardiovascular: Negative.   Gastrointestinal: Negative.   Musculoskeletal: Negative.   Skin: Negative.   Neurological: Negative.   Psychiatric/Behavioral: Negative.    Blood pressure 95/63, pulse 91, temperature 99.4 F (37.4 C), temperature source Oral, resp. rate 18, height 5\' 11"  (1.803 m), weight 102.5 kg, SpO2 100 %. Body mass index is 31.52 kg/m.  Mental Status Per Nursing Assessment::   On Admission:  NA  Demographic Factors:  Living alone  Loss Factors: Financial problems/change in socioeconomic status  Historical Factors: Impulsivity  Risk Reduction Factors:   NA  Continued Clinical Symptoms:  Alcohol/Substance Abuse/Dependencies  Cognitive Features That Contribute To Risk:  None    Suicide Risk:  Minimal: No identifiable suicidal ideation.  Patients presenting with no risk factors but with morbid ruminations; may be classified as minimal risk based on the severity of the depressive symptoms    Plan Of Care/Follow-up recommendations:  Patient denies any suicidal ideation.  Behavior has been calm and appropriate.   Psychotic symptoms are resolved.  Patient has been counseled about the dangerousness of stimulant abuse and strongly encouraged to take more seriously the need to stay clean from drug abuse.  He is to continue current medicine and has follow-up arranged through the [place].  Alethia Berthold, MD 02/12/2022, 10:41 AM

## 2022-02-12 NOTE — Discharge Summary (Signed)
Physician Discharge Summary Note  Patient:  Jeremy Frank is an 36 y.o., male MRN:  [id number] DOB:  [birth year] Patient phone:  [phone] (home)  Patient address:   [address] [place] [address],  Total Time spent with patient: 30 minutes  Date of Admission:  02/10/2022 Date of Discharge: 02/12/2022  Reason for Admission: Patient had been admitted to the medical service after complaining of chest pain in the emergency room.  He was having evidence of psychotic thinking and behavior with paranoia agitation and confusion.  He ruled out for any cardiac illness and was transferred to the behavioral health service.  Patient is known from previous encounters and typical to his usual history he resolved his psychotic symptoms within about a day once the amphetamines were out of his system.  Patient has a history of head injury and possible schizophrenia and PTSD and is blunted in his affect at baseline but has been calm taking care of his basic needs not aggressive no longer paranoid denies hallucinations.  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  He has had multiple episodes of substance induced psychosis on top of head injury PTSD possible psychotic disorder  Past Medical History:  Past Medical History:  Diagnosis Date   Drug dependence ([place])    GI bleed    Hiatal hernia    Hypertension    Migraine    PTSD (post-traumatic stress disorder)     Past Surgical History:  Procedure Laterality Date   KNEE SURGERY     Family History:  Family History  Problem Relation Age of Onset   Heart  failure Mother    Hypertension Father    Family Psychiatric  History: See previous Social History:  Social History   Substance and Sexual Activity  Alcohol Use Yes   Alcohol/week: 42.0 standard drinks   Types: 42 Cans of beer per week   Comment: per pt he drinks 6-12 cans of beer daily     Social History   Substance and Sexual Activity  Drug Use Yes   Types: Cocaine, Marijuana, Amphetamines    Social History   Socioeconomic History   Marital status: Single    Spouse name: Not on file   Number of children: Not on file   Years of education: Not on file   Highest education level: Not on file  Occupational History   Not on file  Tobacco Use   Smoking status: Every Day    Packs/day: 0.25    Types: Cigarettes   Smokeless tobacco: Never  Vaping Use   Vaping Use: Never used  Substance and Sexual Activity   Alcohol use: Yes    Alcohol/week: 42.0 standard drinks    Types: 42 Cans of beer per week    Comment: per pt he drinks 6-12 cans of beer daily   Drug use: Yes    Types: Cocaine, Marijuana, Amphetamines   Sexual activity: Not Currently  Other Topics Concern   Not on file  Social History Narrative   Not on file   Social Determinants of Health   Financial Resource Strain: Not on file  Food Insecurity: Not on file  Transportation Needs: Not on file  Physical Activity: Not on file  Stress: Not on file  Social Connections: Not on file    Hospital Course: See note above.  Patient was continued on his usual outpatient medicines low-dose olanzapine primarily.  Showed no aggression or dangerous behavior on the unit.  Psychotic symptoms are resolved.  Patient showing improved insight although still blunted with some chronic cognitive impairment.  Denies suicidal or homicidal ideation.  Has received counseling about the importance of discontinuing amphetamine and other stimulant abuse.  He has follow-up already arranged to the [place] and a place to live.  Patient will be  discharged with current prescriptions  Physical Findings: AIMS:  , ,  ,  ,    CIWA:    COWS:     Musculoskeletal: Strength & Muscle Tone: within normal limits Gait & Station: normal Patient leans: N/A   Psychiatric Specialty Exam:  Presentation  General Appearance: Appropriate for Environment  Eye Contact:Fair  Speech:Clear and Coherent; Slow  Speech Volume:Normal  Handedness:Right   Mood and Affect  Mood:Dysphoric  Affect:Blunt   Thought Process  Thought Processes:Disorganized  Descriptions of Associations:Loose  Orientation:Full (Time, Place and Person)  Thought Content:Delusions; Scattered; Paranoid Ideation  History of Schizophrenia/Schizoaffective disorder:Yes  Duration of Psychotic Symptoms:Greater than six months  Hallucinations:No data recorded Ideas of Reference:Paranoia  Suicidal Thoughts:No data recorded Homicidal Thoughts:No data recorded  Sensorium  Memory:Immediate Poor  Judgment:Poor  Insight:Lacking   Executive Functions  Concentration:Fair  Attention Span:Fair  [place]  Language:Poor   Psychomotor Activity  Psychomotor Activity:No data recorded  Assets  Assets:Resilience   Sleep  Sleep:No data recorded   Physical Exam: Physical Exam Vitals and nursing note reviewed.  Constitutional:      Appearance: Normal appearance.  HENT:     Head: Normocephalic and atraumatic.     Mouth/Throat:     Pharynx: Oropharynx is clear.  Eyes:     Pupils: Pupils are equal, round, and reactive to light.  Cardiovascular:     Rate and Rhythm: Normal rate and regular rhythm.  Pulmonary:     Effort: Pulmonary effort is normal.     Breath sounds: Normal breath sounds.  Abdominal:     General: Abdomen is flat.     Palpations: Abdomen is soft.  Musculoskeletal:        General: Normal range of motion.  Skin:    General: Skin is warm and dry.  Neurological:     General: No focal deficit present.      Mental Status: He is alert. Mental status is at baseline.  Psychiatric:        Mood and Affect: Mood normal.        Thought Content: Thought content normal.   Review of Systems  Constitutional: Negative.   HENT: Negative.    Eyes: Negative.   Respiratory: Negative.    Cardiovascular: Negative.   Gastrointestinal: Negative.   Musculoskeletal: Negative.   Skin: Negative.   Neurological: Negative.   Psychiatric/Behavioral: Negative.    Blood pressure 95/63, pulse 91, temperature 99.4 F (37.4 C), temperature source Oral, resp. rate 18, height 5\' 11"  (1.803 m), weight 102.5 kg, SpO2 100 %. Body mass index is 31.52 kg/m.   Social History   Tobacco Use  Smoking Status Every Day   Packs/day: 0.25   Types: Cigarettes  Smokeless Tobacco Never   Tobacco Cessation:  A prescription for an FDA-approved tobacco cessation medication was offered at discharge and the patient refused   Blood Alcohol level:  Lab Results  Component Value Date   ETH <10 02/10/2022   ETH <10 0000000    Metabolic Disorder Labs:  Lab Results  Component Value Date   HGBA1C 5.1 02/11/2022   MPG 99.67 02/11/2022   MPG 119.76 06/19/2021   No results found for: PROLACTIN Lab Results  Component Value Date   CHOL 165 02/11/2022   TRIG 81 02/11/2022   HDL 47 02/11/2022   CHOLHDL 3.5 02/11/2022   VLDL 16 02/11/2022   LDLCALC 102 (H) 02/11/2022   LDLCALC 72 06/19/2021    See Psychiatric Specialty Exam and Suicide Risk Assessment completed by Attending Physician prior to discharge.  Discharge destination:  Home  Is patient on multiple antipsychotic therapies at discharge:  No   Has Patient had three or more failed trials of antipsychotic monotherapy by history:  No  Recommended Plan for Multiple Antipsychotic Therapies: NA  Discharge Instructions     Diet - low sodium heart healthy   Complete by: As directed    Increase activity slowly   Complete by: As directed       Allergies as of  02/12/2022       Reactions   Peanut-containing Drug Products Diarrhea, Anaphylaxis, Swelling   Cheese Diarrhea   Eggs Or Egg-derived Products Diarrhea        Medication List     STOP taking these medications    haloperidol 2 MG tablet Commonly known as: HALDOL       TAKE these medications      Indication  divalproex 500 MG DR tablet Commonly known as: DEPAKOTE Take 1 tablet (500 mg total) by mouth every 12 (twelve) hours.  Indication: Depressive Phase of Manic-Depression   gabapentin 300 MG capsule Commonly known as: NEURONTIN Take 1 capsule (300 mg total) by mouth 3 (three) times daily.  Indication: Abuse or Misuse of Alcohol   hydrOXYzine 50 MG tablet Commonly known as: ATARAX Take 1 tablet (50 mg total) by mouth every 4 (four) hours as needed for anxiety.  Indication: Feeling Anxious   OLANZapine 5 MG tablet Commonly known as: ZYPREXA Take 1 tablet (5 mg total) by mouth at bedtime.  Indication: Schizophrenia   pantoprazole 40 MG tablet Commonly known as: PROTONIX Take 2 tablets (80 mg total) by mouth daily.  Indication: Gastroesophageal Reflux Disease   traZODone 100 MG tablet Commonly known as: DESYREL Take 1 tablet (100 mg total) by mouth at bedtime as needed for sleep.  Indication: Trouble Sleeping         Follow-up recommendations: Continue current medicine.  Do not use amphetamines stimulants or any abusable drugs.  Follow up at the [place].  Comments: Prescriptions provided  Signed: Alethia Berthold, MD 02/12/2022, 10:48 AM

## 2022-02-12 NOTE — Progress Notes (Signed)
D: Pt alert and oriented. Pt rates depression 2/10, hopelessness 1/10, and anxiety 1/10. Pt reports energy level as normal and concentration as being good. Pt reports sleep last night as being fair. Pt did not receive medications for sleep. Pt denies experiencing any pain at this time. Pt denies experiencing any SI/HI, or AVH at this time.   A: Scheduled medications administered to pt, per MD orders. Support and encouragement provided. Frequent verbal contact made. Routine safety checks conducted q15 minutes.   R: No adverse drug reactions noted. Pt verbally contracts for safety at this time. Pt complaint with medications. Pt interacts minimally with others on the unit. Pt remains safe at this time. Will continue to monitor.

## 2022-02-12 NOTE — BH IP Treatment Plan (Signed)
Interdisciplinary Treatment and Diagnostic Plan Update  02/12/2022 Time of Session: 9:30AM Jeremy Frank MRN: [id number]  Principal Diagnosis: Amphetamine and psychostimulant-induced psychosis with hallucinations (HCC)  Secondary Diagnoses: Principal Problem:   Amphetamine and psychostimulant-induced psychosis with hallucinations (HCC) Active Problems:   Psychosis (HCC)   Traumatic brain injury (HCC)   Cocaine dependence (HCC)   Amphetamine abuse (HCC)   Current Medications:  Current Facility-Administered Medications  Medication Dose Route Frequency Provider Last Rate Last Admin   acetaminophen (TYLENOL) tablet 650 mg  650 mg Oral Q6H PRN Clapacs, Jackquline Denmark, MD       alum & mag hydroxide-simeth (MAALOX/MYLANTA) 200-200-20 MG/5ML suspension 30 mL  30 mL Oral Q4H PRN Clapacs, John T, MD       divalproex (DEPAKOTE) DR tablet 500 mg  500 mg Oral Q12H Clapacs, Jackquline Denmark, MD   500 mg at 02/12/22 0802   gabapentin (NEURONTIN) capsule 300 mg  300 mg Oral TID Clapacs, Jackquline Denmark, MD   300 mg at 02/12/22 1152   haloperidol (HALDOL) tablet 2 mg  2 mg Oral Q6H PRN Clapacs, Jackquline Denmark, MD       Or   haloperidol lactate (HALDOL) injection 2 mg  2 mg Intramuscular Q6H PRN Clapacs, Jackquline Denmark, MD       hydrOXYzine (ATARAX) tablet 50 mg  50 mg Oral Q4H PRN Clapacs, John T, MD       magnesium hydroxide (MILK OF MAGNESIA) suspension 30 mL  30 mL Oral Daily PRN Clapacs, Jackquline Denmark, MD       OLANZapine (ZYPREXA) tablet 5 mg  5 mg Oral QHS Clapacs, John T, MD   5 mg at 02/11/22 2132   pantoprazole (PROTONIX) EC tablet 80 mg  80 mg Oral Daily Clapacs, Jackquline Denmark, MD   80 mg at 02/12/22 0803   traZODone (DESYREL) tablet 100 mg  100 mg Oral QHS PRN Clapacs, Jackquline Denmark, MD       PTA Medications: Medications Prior to Admission  Medication Sig Dispense Refill Last Dose   haloperidol (HALDOL) 2 MG tablet Take 1 tablet (2 mg total) by mouth 2 (two) times daily. (Patient not taking: Reported on 02/10/2022) 60 tablet 1    [DISCONTINUED]  divalproex (DEPAKOTE) 500 MG DR tablet Take 1 tablet (500 mg total) by mouth every 12 (twelve) hours. (Patient not taking: Reported on 02/10/2022) 60 tablet 1    [DISCONTINUED] gabapentin (NEURONTIN) 300 MG capsule Take 1 capsule (300 mg total) by mouth 3 (three) times daily. (Patient not taking: Reported on 02/10/2022) 90 capsule 1    [DISCONTINUED] pantoprazole (PROTONIX) 40 MG tablet Take 2 tablets (80 mg total) by mouth daily. (Patient not taking: Reported on 02/10/2022) 60 tablet 1     Patient Stressors: Medication change or noncompliance   Substance abuse    Patient Strengths: Forensic psychologist fund of knowledge   Treatment Modalities: Medication Management, Group therapy, Case management,  1 to 1 session with clinician, Psychoeducation, Recreational therapy.   Physician Treatment Plan for Primary Diagnosis: Amphetamine and psychostimulant-induced psychosis with hallucinations (HCC) Long Term Goal(s): Improvement in symptoms so as ready for discharge   Short Term Goals: Ability to identify changes in lifestyle to reduce recurrence of condition will improve Ability to identify triggers associated with substance abuse/mental health issues will improve Ability to verbalize feelings will improve Ability to demonstrate self-control will improve  Medication Management: Evaluate patient's response, side effects, and tolerance of medication regimen.  Therapeutic Interventions: 1 to 1 sessions,  Unit Group sessions and Medication administration.  Evaluation of Outcomes: Adequate for Discharge  Physician Treatment Plan for Secondary Diagnosis: Principal Problem:   Amphetamine and psychostimulant-induced psychosis with hallucinations ([place]) Active Problems:   Psychosis ([place])   Traumatic brain injury ([place])   Cocaine dependence ([place])   Amphetamine abuse ([place])  Long Term Goal(s): Improvement in symptoms so as ready for discharge   Short Term Goals: Ability to identify changes in  lifestyle to reduce recurrence of condition will improve Ability to identify triggers associated with substance abuse/mental health issues will improve Ability to verbalize feelings will improve Ability to demonstrate self-control will improve     Medication Management: Evaluate patient's response, side effects, and tolerance of medication regimen.  Therapeutic Interventions: 1 to 1 sessions, Unit Group sessions and Medication administration.  Evaluation of Outcomes: Adequate for Discharge   RN Treatment Plan for Primary Diagnosis: Amphetamine and psychostimulant-induced psychosis with hallucinations ([place]) Long Term Goal(s): Knowledge of disease and therapeutic regimen to maintain health will improve  Short Term Goals: Ability to verbalize frustration and anger appropriately will improve, Ability to demonstrate self-control, Ability to participate in decision making will improve, Ability to verbalize feelings will improve, Ability to disclose and discuss suicidal ideas, Ability to identify and develop effective coping behaviors will improve, and Compliance with prescribed medications will improve  Medication Management: RN will administer medications as ordered by provider, will assess and evaluate patient's response and provide education to patient for prescribed medication. RN will report any adverse and/or side effects to prescribing provider.  Therapeutic Interventions: 1 on 1 counseling sessions, Psychoeducation, Medication administration, Evaluate responses to treatment, Monitor vital signs and CBGs as ordered, Perform/monitor CIWA, COWS, AIMS and Fall Risk screenings as ordered, Perform wound care treatments as ordered.  Evaluation of Outcomes: Adequate for Discharge   LCSW Treatment Plan for Primary Diagnosis: Amphetamine and psychostimulant-induced psychosis with hallucinations ([place]) Long Term Goal(s): Safe transition to appropriate next level of care at discharge, Engage patient in  therapeutic group addressing interpersonal concerns.  Short Term Goals: Engage patient in aftercare planning with referrals and resources, Increase social support, Increase ability to appropriately verbalize feelings, Increase emotional regulation, Facilitate acceptance of mental health diagnosis and concerns, and Increase skills for wellness and recovery  Therapeutic Interventions: Assess for all discharge needs, 1 to 1 time with Social worker, Explore available resources and support systems, Assess for adequacy in community support network, Educate family and significant other(s) on suicide prevention, Complete Psychosocial Assessment, Interpersonal group therapy.  Evaluation of Outcomes: Adequate for Discharge   Progress in Treatment: Attending groups: No. Participating in groups: No. Taking medication as prescribed: Yes. Toleration medication: Yes. Family/Significant other contact made: No, will contact:  once permission is given.  Patient understands diagnosis: Yes. Discussing patient identified problems/goals with staff: Yes. Medical problems stabilized or resolved: Yes. Denies suicidal/homicidal ideation: Yes. Issues/concerns per patient self-inventory: No. Other: none  New problem(s) identified: No, Describe:  none  New Short Term/Long Term Goal(s): detox, elimination of symptoms of psychosis, medication management for mood stabilization; elimination of SI thoughts; development of comprehensive mental wellness/sobriety plan.   Patient Goals:  "find something to help keep my mood stable"  Discharge Plan or Barriers:  Patient reports plans to return to his hotel.  He will follow up with the [facility] in [place] for mental health needs.   Reason for Continuation of Hospitalization: Anxiety Depression Hallucinations Medication stabilization Withdrawal symptoms  Estimated Length of Stay:  1-7 days   Scribe for Treatment Team:  Rozann Lesches, LCSW 02/12/2022 12:41 PM

## 2022-02-12 NOTE — Progress Notes (Signed)
D: Pt alert and oriented. Pt denies experiencing any pain, SI/HI, or AVH at this time. Pt reports he will be able to keep himself safe when he returns home.   A: Pt received discharge and medication education/information. Pt belongings were returned and signed for at this time including printed prescriptions.   R: Pt verbalized understanding of discharge and medication education/information.  Pt escorted by staff to medical mall front lobby where pt was picked up by [facility] and transported to the hotel he's staying at.

## 2022-02-12 NOTE — BHH Counselor (Signed)
Adult Comprehensive Assessment  Patient ID: Jeremy Frank, male   DOB: Dec 18, 1986, 36 y.o.   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 w/ [facility]; admitted due to paranoid thoughts/behaviors after relapsing on stimulants. Patient has no psychotic features since admission. Patient is appropriate for discharge prior to assessment being completed. Presents as calm and cooperative during conversation. Patient shared that he plans on following up with established aftercare at the [facility]. Patient is rated at 90% disabled and compensated accordingly; plans to return to hotel after discharge. Patient denies SI/HI/AVH, no evidence of memory or concentration impariment, appearance is WNL. Affect is euthymic, congruent w/ situation and context. Paitent is able to contract for safety at this time. Theraputic recomendations include follow up outpatient care with [facility] to include psychiatric medication management, therapy, and primary care. Patient has been given boarding house list to follow up with once he returns to his hotel.  Corky Crafts. 02/12/2022

## 2022-02-12 NOTE — Progress Notes (Signed)
[facility] LCSW Progress Note   02/12/2022    10:35 AM  Jeremy Frank   [id number]   Type of Contact and Topic:  [facility] 72 hour notice   CSW contacted [facility] 72 hour notice line w/ patient's written consent to minimize the financial burden on patient for hospital stay.    CSW given the confirmation # [id number]. No further action.     Signed:  Corky Crafts, MSW, LCSWA, LCAS 02/12/2022 10:35 AM

## 2022-02-12 NOTE — [facility] Suicide Risk Assessment (Signed)
[facility] INPATIENT:  Family/Significant Other Suicide Prevention Education  Suicide Prevention Education:  Patient Refusal for Family/Significant Other Suicide Prevention Education: The patient Jeremy Frank has refused to provide written consent for family/significant other to be provided Family/Significant Other Suicide Prevention Education during admission and/or prior to discharge.  Physician notified.  CSW completed SPE with patient. Discussed potential triggers leading to suicidal ideation in addition to coping skills one might use in order to delay and distract self from self harming behaviors. CSW encouraged patient to utilize emergency services if they felt unable to maintain their safety. SPE flyer provided to patient at this time.    Corky Crafts 02/12/2022, 10:59 AM

## 2022-02-12 NOTE — Progress Notes (Signed)
Recreation Therapy Notes  Date: 02/12/2022  Time: 10:45 am     Location:  Craft room    Behavioral response: Appropriate  Intervention Topic:  Stress Management    Discussion/Intervention:  Group content on today was focused on stress. The group defined stress and way to cope with stress. Participants expressed how they know when they are stresses out. Individuals described the different ways they have to cope with stress. The group stated reasons why it is important to cope with stress. Patient explained what good stress is and some examples. The group participated in the intervention Stress Management. Individuals were separated into two group and answered questions related to stress.  Clinical Observations/Feedback: Patient came to group and stated he manages his stress by identifying the stressors and using coping skills. He explained that he could improve his stress management techniques by trying new coping skills. Individual was social with peers and staff while participating in the intervention.  Allayah Raineri LRT/CTRS           Davielle Lingelbach 02/12/2022 12:42 PM

## 2022-02-12 NOTE — Progress Notes (Signed)
°  [facility] Adult Case Management Discharge Plan :  Will you be returning to the same living situation after discharge:  Yes,  Patient plans on returning to hotel room.  At discharge, do you have transportation home?: Yes,  CSW to arrange transportation at discharge . Patient has signed the [facility] transportation waiver which was emailed to transportation office.  Do you have the ability to pay for your medications: Yes,  [place] ins.  Release of information consent forms completed and in the chart;  Patient's signature needed at discharge.  Patient to Follow up at:  Follow-up Information     [facility] Go on 02/13/2022.   Why: Please present for scheduled phone call appointment on Thursday, 13 Feb 2022 at 1130. THIS APPOINTMENT IS VIA TELEPHONE, NO NOT PREENT TO THE CLINIC. Contact information: [address] [place] [address] [phone]                 Next level of care provider has access to [place] and Suicide Prevention discussed: Yes,  SPE completed with patient, declined consent for CSW to reach collateral.    Has patient been referred to the Quitline?: Patient refused referral  Patient has been referred for addiction treatment: Yes  Durenda Hurt, LCSWA 02/12/2022, 10:56 AM

## 2022-03-15 ENCOUNTER — Emergency Department
Admission: EM | Admit: 2022-03-15 | Discharge: 2022-03-17 | Disposition: A | Discharge status: Home / Self Care | Payer: No Typology Code available for payment source | Attending: Emergency Medicine | Admitting: Emergency Medicine

## 2022-03-15 ENCOUNTER — Other Ambulatory Visit: Payer: Self-pay

## 2022-03-15 DIAGNOSIS — I10 Essential (primary) hypertension: Secondary | ICD-10-CM | POA: Diagnosis not present

## 2022-03-15 DIAGNOSIS — X58XXXA Exposure to other specified factors, initial encounter: Secondary | ICD-10-CM | POA: Diagnosis not present

## 2022-03-15 DIAGNOSIS — F1510 Other stimulant abuse, uncomplicated: Secondary | ICD-10-CM | POA: Diagnosis not present

## 2022-03-15 DIAGNOSIS — S069X0A Unspecified intracranial injury without loss of consciousness, initial encounter: Secondary | ICD-10-CM | POA: Diagnosis not present

## 2022-03-15 DIAGNOSIS — F15951 Other stimulant use, unspecified with stimulant-induced psychotic disorder with hallucinations: Secondary | ICD-10-CM | POA: Diagnosis present

## 2022-03-15 DIAGNOSIS — Z20822 Contact with and (suspected) exposure to covid-19: Secondary | ICD-10-CM | POA: Diagnosis not present

## 2022-03-15 DIAGNOSIS — F17210 Nicotine dependence, cigarettes, uncomplicated: Secondary | ICD-10-CM | POA: Diagnosis not present

## 2022-03-15 DIAGNOSIS — F1420 Cocaine dependence, uncomplicated: Secondary | ICD-10-CM | POA: Insufficient documentation

## 2022-03-15 DIAGNOSIS — F333 Major depressive disorder, recurrent, severe with psychotic symptoms: Secondary | ICD-10-CM | POA: Diagnosis not present

## 2022-03-15 DIAGNOSIS — F4310 Post-traumatic stress disorder, unspecified: Secondary | ICD-10-CM | POA: Diagnosis not present

## 2022-03-15 DIAGNOSIS — F29 Unspecified psychosis not due to a substance or known physiological condition: Secondary | ICD-10-CM

## 2022-03-15 DIAGNOSIS — R440 Auditory hallucinations: Secondary | ICD-10-CM | POA: Diagnosis present

## 2022-03-15 DIAGNOSIS — S069XAA Unspecified intracranial injury with loss of consciousness status unknown, initial encounter: Secondary | ICD-10-CM | POA: Diagnosis present

## 2022-03-15 DIAGNOSIS — F1910 Other psychoactive substance abuse, uncomplicated: Secondary | ICD-10-CM

## 2022-03-15 LAB — COMPREHENSIVE METABOLIC PANEL
ALT: 15 U/L (ref 0–44)
AST: 27 U/L (ref 15–41)
Albumin: 5 g/dL (ref 3.5–5.0)
Alkaline Phosphatase: 69 U/L (ref 38–126)
Anion gap: 11 (ref 5–15)
BUN: 19 mg/dL (ref 6–20)
CO2: 25 mmol/L (ref 22–32)
Calcium: 9.9 mg/dL (ref 8.9–10.3)
Chloride: 103 mmol/L (ref 98–111)
Creatinine, Ser: 1.63 mg/dL — ABNORMAL HIGH (ref 0.61–1.24)
GFR, Estimated: 55 mL/min — ABNORMAL LOW (ref >60)
Glucose, Bld: 96 mg/dL (ref 70–99)
Potassium: 3.7 mmol/L (ref 3.5–5.1)
Sodium: 139 mmol/L (ref 135–145)
Total Bilirubin: 1 mg/dL (ref 0.3–1.2)
Total Protein: 9.1 g/dL — ABNORMAL HIGH (ref 6.5–8.1)

## 2022-03-15 LAB — SALICYLATE LEVEL: Salicylate Lvl: <7 mg/dL — ABNORMAL LOW (ref 7.0–30.0)

## 2022-03-15 LAB — CBC
HCT: 43.6 % (ref 39.0–52.0)
Hemoglobin: 14.6 g/dL (ref 13.0–17.0)
MCH: 29.7 pg (ref 26.0–34.0)
MCHC: 33.5 g/dL (ref 30.0–36.0)
MCV: 88.6 fL (ref 80.0–100.0)
Platelets: 220 10*3/uL (ref 150–400)
RBC: 4.92 MIL/uL (ref 4.22–5.81)
RDW: 12.5 % (ref 11.5–15.5)
WBC: 10.8 10*3/uL — ABNORMAL HIGH (ref 4.0–10.5)
nRBC: 0 % (ref 0.0–0.2)

## 2022-03-15 LAB — ETHANOL: Alcohol, Ethyl (B): <10 mg/dL (ref <10)

## 2022-03-15 LAB — ACETAMINOPHEN LEVEL: Acetaminophen (Tylenol), Serum: <10 ug/mL — ABNORMAL LOW (ref 10–30)

## 2022-03-15 MED ORDER — HALOPERIDOL 5 MG PO TABS
5.0000 mg | ORAL_TABLET | Freq: Once | ORAL | Status: AC
  Administered 2022-03-15: 5 mg via ORAL
  Filled 2022-03-15: qty 1

## 2022-03-15 MED ORDER — DIPHENHYDRAMINE HCL 25 MG PO CAPS
50.0000 mg | ORAL_CAPSULE | Freq: Once | ORAL | Status: AC
  Administered 2022-03-15: 50 mg via ORAL
  Filled 2022-03-15: qty 2

## 2022-03-15 NOTE — BH Assessment (Signed)
Comprehensive Clinical Assessment (CCA) Note ? ?03/15/2022 ?Jeremy Frank ?[id number] ?Recommendations for Services/Supports/Treatments: Consulted with Jeremy D., NP, who determined pt. meets inpatient psychiatric criteria. Notified Dr. Katrinka Frank and Jeremy Lemmings, RN of disposition recommendation. ? ?Jeremy Frank is a 36 year old, English speaking, black male with a psych hx of MDD, psychosis, TBI, and polysubstance abuse. Pt presented to [facility] ED voluntarily with complaints of visual and auditory hallucinations. On assessment, pt. presented with restless and disorganized behavior. Pt was visibly anxious and was paranoid about someone harming him. Pt had soft, blocked speech. Pt was preoccupied with auditory hallucinations. Pt explained that the voices tell him that they want to inflict harm upon him. Pt admitted to using unknown amounts of marijuana and cocaine. Pt was responding to internal stimuli throughout the assessment. Pt had difficulty concentrating and his answers were delayed. Pt denied SI/HI/V/H. Pt had fleeting eye contact. Pt's UDS/BAL in pending status at this time.  ? ? ?Chief Complaint:  ?Chief Complaint  ?Patient presents with  ? Hallucinations  ? ?Visit Diagnosis: Psychosis (HCC) ?  Traumatic brain injury [facility]) ?   ?CCA Screening, Triage and Referral (STR) ? ?Patient Reported Information ?How did you hear about [place]? Self ? ?Referral name: No data recorded ?Referral phone number: No data recorded ? ?Whom do you see for routine medical problems? No data recorded ?Practice/Facility Name: No data recorded ?Practice/Facility Phone Number: No data recorded ?Name of Contact: No data recorded ?Contact Number: No data recorded ?Contact Fax Number: No data recorded ?Prescriber Name: No data recorded ?Prescriber Address (if known): No data recorded ? ?What Is the Reason for Your Visit/Call Today? Pt presented to [facility] ED with c/o visual and auditory hallucinations starting this evening. Paranoia. ? ?How Long Has  This Been Causing You Problems? > than 6 months ? ?What Do You Feel Would Help You the Most Today? Treatment for Depression or other mood problem ? ? ?Have You Recently Been in Any Inpatient Treatment (Frank/Detox/Crisis Center/28-Day Program)? No data recorded ?Name/Location of Program/Frank:No data recorded ?How Long Were You There? No data recorded ?When Were You Discharged? No data recorded ? ?Have You Ever Received Services From [employer] [facility] [facility] Before? No data recorded ?Who Do You See at [facility]? No data recorded ? ?Have You Recently Had Any Thoughts About Hurting Yourself? No ? ?Are You Planning to Commit Suicide/Harm Yourself At This time? No ? ? ?Have you Recently Had Thoughts About Hurting Someone Jeremy Frank? No ? ?Explanation: No data recorded ? ?Have You Used Any Alcohol or Drugs in the Past 24 Hours? Yes ? ?How Long Ago Did You Use Drugs or Alcohol? No data recorded ?What Did You Use and How Much? Marijuana and cocaine; amounts unknown ? ? ?Do You Currently Have a Therapist/Psychiatrist? Yes ? ?Name of Therapist/Psychiatrist: [facility] ? ? ?Have You Been Recently Discharged From Any Office Practice or Programs? No ? ?Explanation of Discharge From Practice/Program: No data recorded ? ?  ?CCA Screening Triage Referral Assessment ?Type of Contact: Face-to-Face ? ?Is this Initial or Reassessment? No data recorded ?Date Telepsych consult ordered in CHL:  No data recorded ?Time Telepsych consult ordered in CHL:  No data recorded ? ?Patient Reported Information Reviewed? No data recorded ?Patient Left Without Being Seen? No data recorded ?Reason for Not Completing Assessment: No data recorded ? ?Collateral Involvement: None provided ? ? ?Does Patient Have a Automotive engineer Guardian? No data recorded ?Name and Contact of Legal Guardian: No data recorded ?If Minor and Not Living  with Parent(s), Who has Custody? n/a ? ?Is CPS involved or ever been involved? Never ? ?Is APS involved or ever been involved?  Never ? ? ?Patient Determined To Be At Risk for Harm To Self or Others Based on Review of Patient Reported Information or Presenting Complaint? No ? ?Method: No data recorded ?Availability of Means: No data recorded ?Intent: No data recorded ?Notification Required: No data recorded ?Additional Information for Danger to Others Potential: No data recorded ?Additional Comments for Danger to Others Potential: No data recorded ?Are There Guns or Other Weapons in Your Home? No data recorded ?Types of Guns/Weapons: No data recorded ?Are These Weapons Safely Secured?                            No data recorded ?Who Could Verify You Are Able To Have These Secured: No data recorded ?Do You Have any Outstanding Charges, Pending Court Dates, Parole/Probation? No data recorded ?Contacted To Inform of Risk of Harm To Self or Others: No data recorded ? ?Location of Assessment: [facility] ED ? ? ?Does Patient Present under Involuntary Commitment? No ? ?IVC Papers Initial File Date: 12/30/21 ? ? ?[place] of Residence: [place] ? ? ?Patient Currently Receiving the Following Services: Medication Management ? ? ?Determination of Need: Emergent (2 hours) ? ? ?Options For Referral: Inpatient Hospitalization; ED Referral; Therapeutic Triage Services ? ? ? ? ?CCA Biopsychosocial ?Intake/Chief Complaint:  No data recorded ?Current Symptoms/Problems: No data recorded ? ?Patient Reported Schizophrenia/Schizoaffective Diagnosis in Past: Yes ? ? ?Strengths: Pt has a support system; pt is able to ask for help ? ?Preferences: No data recorded ?Abilities: No data recorded ? ?Type of Services Patient Feels are Needed: No data recorded ? ?Initial Clinical Notes/Concerns: No data recorded ? ?Mental Health Symptoms ?Depression:   ?None ?  ?Duration of Depressive symptoms:  ?N/A ?  ?Mania:   ?N/A ?  ?Anxiety:    ?Restlessness; Worrying; Tension; Difficulty concentrating ?  ?Psychosis:   ?Hallucinations ?  ?Duration of Psychotic symptoms:  ?Greater than six  months ?  ?Trauma:   ?Hypervigilance; Difficulty staying/falling asleep ?  ?Obsessions:   ?Intrusive/time consuming; Recurrent & persistent thoughts/impulses/images; Cause anxiety ?  ?Compulsions:   ?Intended to reduce stress or prevent another outcome; Poor Insight; Disrupts with routine/functioning; Absent insight/delusional ?  ?Inattention:   ?N/A ?  ?Hyperactivity/Impulsivity:   ?N/A ?  ?Oppositional/Defiant Behaviors:   ?None ?  ?Emotional Irregularity:   ?N/A ?  ?Other Mood/Personality Symptoms:  No data recorded  ? ?Mental Status Exam ?Appearance and self-care  ?Stature:   ?Average ?  ?Weight:   ?Average weight ?  ?Clothing:   ?-- (In scrubs) ?  ?Grooming:   ?Normal ?  ?Cosmetic use:   ?None ?  ?Posture/gait:   ?Normal ?  ?Motor activity:   ?Restless ?  ?Sensorium  ?Attention:   ?Distractible; Inattentive ?  ?Concentration:   ?Anxiety interferes; Focuses on irrelevancies; Preoccupied ?  ?Orientation:   ?Situation; Place; Person; Object ?  ?Recall/memory:   ?Normal ?  ?Affect and Mood  ?Affect:   ?Blunted ?  ?Mood:   ?Anxious ?  ?Relating  ?Eye contact:   ?Normal ?  ?Facial expression:   ?Anxious ?  ?Attitude toward examiner:   ?Suspicious; Cooperative ?  ?Thought and Language  ?Speech flow:  ?Blocked ?  ?Thought content:   ?Suspicious; Appropriate to Mood and Circumstances ?  ?Preoccupation:   ?None ?  ?Hallucinations:   ?Auditory ?  ?  Organization:  No data recorded  ?Executive Functions  ?Fund of Knowledge:   ?Average ?  ?Intelligence:   ?Average ?  ?Abstraction:   ?Functional ?  ?Judgement:   ?Impaired ?  ?Reality Testing:   ?Distorted ?  ?Insight:   ?Present; Shallow ?  ?Decision Making:   ?Impulsive ?  ?Social Functioning  ?Social Maturity:   ?Irresponsible; Impulsive ?  ?Social Judgement:   ?"Chief of Staff"; Heedless ?  ?Stress  ?Stressors:   ?Housing ?  ?Coping Ability:   ?Overwhelmed ?  ?Skill Deficits:   ?Decision making ?  ?Supports:   ?Support needed; Friends/Service system ?   ? ? ?Religion: ?Religion/Spirituality ?Are You A Religious Person?: No ?How Might This Affect Treatment?: n/a ? ?Leisure/Recreation: ?Leisure / Recreation ?Do You Have Hobbies?: No ? ?Exercise/Diet: ?Exercise/Diet ?Do You Exercise?

## 2022-03-15 NOTE — ED Provider Notes (Signed)
? ?[facility] ?Provider Note ? ? ? Event Date/Time  ? First MD Initiated Contact with Patient 03/15/22 2255   ?  (approximate) ? ? ?History  ? ?Hallucinations ? ? ?HPI ? ?Jeremy Frank is a 36 y.o. male who presents to the ED for evaluation of Hallucinations ?  ?I reviewed behavioral health DC summary from 2/15.  History of polysubstance abuse, TBI and psychoses. ? ?Patient presents to the ED for evaluation of auditory hallucinations.  He reports hearing voices and that they want to kill him.  He denies any personal desire to kill himself.  He reports he wants to live.  Reports he occasionally uses methamphetamines.  Would not commit to answering about actual chronicity of when he has used methamphetamines.. ? ?Physical Exam  ? ?Triage Vital Signs: ?ED Triage Vitals  ?Enc Vitals Group  ?   BP 03/15/22 2207 139/87  ?   Pulse Rate 03/15/22 2207 (!) 111  ?   Resp 03/15/22 2207 18  ?   Temp 03/15/22 2207 99 ?F (37.2 ?C)  ?   Temp Source 03/15/22 2207 Oral  ?   SpO2 03/15/22 2207 100 %  ?   Weight 03/15/22 2208 210 lb (95.3 kg)  ?   Height 03/15/22 2208 5\' 11"  (1.803 m)  ?   Head Circumference --   ?   Peak Flow --   ?   Pain Score 03/15/22 2208 0  ?   Pain Loc --   ?   Pain Edu? --   ?   Excl. in GC? --   ? ? ?Most recent vital signs: ?Vitals:  ? 03/15/22 2207  ?BP: 139/87  ?Pulse: (!) 111  ?Resp: 18  ?Temp: 99 ?F (37.2 ?C)  ?SpO2: 100%  ? ? ?General: Awake, no distress.  Ambulatory around the room independently, agitated and frequently shifting his gaze. ?CV:  Good peripheral perfusion.  ?Resp:  Normal effort.  ?Abd:  No distention.  ?MSK:  No deformity noted.  ?Neuro:  No focal deficits appreciated. Cranial nerves II through XII intact ?5/5 strength and sensation in all 4 extremities ?Other:   ? ? ?ED Results / Procedures / Treatments  ? ?Labs ?(all labs ordered are listed, but only abnormal results are displayed) ?Labs Reviewed  ?COMPREHENSIVE METABOLIC PANEL - Abnormal; Notable for the  following components:  ?    Result Value  ? Creatinine, Ser 1.63 (*)   ? Total Protein 9.1 (*)   ? GFR, Estimated 55 (*)   ? All other components within normal limits  ?SALICYLATE LEVEL - Abnormal; Notable for the following components:  ? Salicylate Lvl <7.0 (*)   ? All other components within normal limits  ?ACETAMINOPHEN LEVEL - Abnormal; Notable for the following components:  ? Acetaminophen (Tylenol), Serum <10 (*)   ? All other components within normal limits  ?CBC - Abnormal; Notable for the following components:  ? WBC 10.8 (*)   ? All other components within normal limits  ?RESP PANEL BY RT-PCR (FLU A&B, COVID) [id number]  ?ETHANOL  ?URINE DRUG SCREEN, QUALITATIVE ([facility] ONLY)  ? ? ?EKG ? ? ?RADIOLOGY ? ? ?Official radiology report(s): ?No results found. ? ?PROCEDURES and INTERVENTIONS: ? ?Procedures ? ?Medications  ?haloperidol (HALDOL) tablet 5 mg (5 mg Oral Given 03/15/22 2336)  ?diphenhydrAMINE (BENADRYL) capsule 50 mg (50 mg Oral Given 03/15/22 2336)  ? ? ? ?IMPRESSION / MDM / ASSESSMENT AND PLAN / ED COURSE  ?I reviewed the triage vital  signs and the nursing notes. ? ?36 year old male with history of substance abuse and psychotic disorder presents to the ED with acute hallucinations and psychoses, uncertain if organic or related to substance abuse, and requiring IVC for psychiatric admission.  He is agitated, elevated and reports actively hearing voices that want to harm him.  Denies any personal intent at self-harm.  No signs of trauma, neurologic or vascular deficits.  No signs of particular toxidromes or ingestions.  He is explicitly requesting oral Haldol and Benadryl, which we provide to help calm him.  IVC orders placed and we will consult with psychiatry, who evaluated patient and plan to admit. ? ?Clinical Course as of 03/16/22 0008  ?Sat Mar 15, 2022  ?2329 The patient has been placed in psychiatric observation due to the need to provide a safe environment for the patient while obtaining psychiatric  consultation and evaluation, as well as ongoing medical and medication management to treat the patient's condition.  The patient has been placed under full IVC at this time. ? ? [DS]  ?  ?Clinical Course User Index ?[DS] Delton Prairie, MD  ? ? ? ?FINAL CLINICAL IMPRESSION(S) / ED DIAGNOSES  ? ?Final diagnoses:  ?Psychosis, unspecified psychosis type (HCC)  ? ? ? ?Rx / DC Orders  ? ?ED Discharge Orders   ? ? None  ? ?  ? ? ? ?Note:  This document was prepared using Dragon voice recognition software and may include unintentional dictation errors. ?  ?Delton Prairie, MD ?03/16/22 0010 ? ?

## 2022-03-15 NOTE — ED Triage Notes (Signed)
During Triage, pt states he has had thoughts of harming himself and others as recently as last night. Pt rambling in triage, stating " I just want it to stop, the voices."  ?

## 2022-03-15 NOTE — ED Triage Notes (Signed)
Visual and auditory hallucinations starting this evening. Paranoia.  ?

## 2022-03-15 NOTE — ED Notes (Signed)
Security in room with this nurse and Misty Stanley, tech as pt is dressed out into hospital scrubs. Pt belongings consisting of ?1 [place]  ?1 long sleeve shirt ?2 pair of pants with belt  ?1 pair of shoes ?1 cell phone with charger ?Placed in pt belonging bag  and labeled appropriately with pt label ?

## 2022-03-15 NOTE — ED Notes (Signed)
TTS and psych provider in with patient. ?

## 2022-03-15 NOTE — ED Notes (Signed)
Patient ambulatory via EMS from home.  Per EMS patient states recently started new medications for mental health and does not feel like they are working, did not take medication today.  Reports having increased anxiety. ?EMS interventions -- hr 120, bp 158/113, pulse oxi 100% on room air, cbg 99. ?

## 2022-03-16 DIAGNOSIS — F15951 Other stimulant use, unspecified with stimulant-induced psychotic disorder with hallucinations: Secondary | ICD-10-CM | POA: Diagnosis not present

## 2022-03-16 DIAGNOSIS — F333 Major depressive disorder, recurrent, severe with psychotic symptoms: Secondary | ICD-10-CM

## 2022-03-16 DIAGNOSIS — F29 Unspecified psychosis not due to a substance or known physiological condition: Secondary | ICD-10-CM | POA: Diagnosis not present

## 2022-03-16 DIAGNOSIS — F1510 Other stimulant abuse, uncomplicated: Secondary | ICD-10-CM

## 2022-03-16 DIAGNOSIS — F4310 Post-traumatic stress disorder, unspecified: Secondary | ICD-10-CM | POA: Diagnosis not present

## 2022-03-16 LAB — RESP PANEL BY RT-PCR (FLU A&B, COVID) ARPGX2
Influenza A by PCR: NEGATIVE
Influenza B by PCR: NEGATIVE
SARS Coronavirus 2 by RT PCR: NEGATIVE

## 2022-03-16 MED ORDER — LORAZEPAM 2 MG/ML IJ SOLN
2.0000 mg | Freq: Once | INTRAMUSCULAR | Status: AC
  Administered 2022-03-16: 2 mg via INTRAMUSCULAR
  Filled 2022-03-16: qty 1

## 2022-03-16 MED ORDER — GABAPENTIN 100 MG PO CAPS
200.0000 mg | ORAL_CAPSULE | Freq: Three times a day (TID) | ORAL | Status: DC
  Administered 2022-03-16 (×2): 200 mg via ORAL
  Filled 2022-03-16 (×2): qty 2

## 2022-03-16 MED ORDER — ZIPRASIDONE MESYLATE 20 MG IM SOLR
20.0000 mg | Freq: Once | INTRAMUSCULAR | Status: AC
  Administered 2022-03-16: 20 mg via INTRAMUSCULAR
  Filled 2022-03-16: qty 20

## 2022-03-16 NOTE — ED Notes (Signed)
IVC/Consult ordered ?

## 2022-03-16 NOTE — Consult Note (Signed)
[facility] Face-to-Face Psychiatry Consult  ? ?Reason for Consult:  Psych Evaluation ?Referring Physician:  Dr. Tamala Julian ?Patient Identification: Jeremy Frank ?MRN:  [id number] ?Principal Diagnosis: <principal problem not specified> ?Diagnosis:  Active Problems: ?  Posttraumatic stress disorder ?  Traumatic brain injury [facility]) ?  Cocaine dependence ([place]) ?  Amphetamine abuse ([place]) ?  Major depressive disorder, recurrent episode, severe, with psychosis ([place]) ? ? ?Total Time spent with patient: 45 minutes ? ?Subjective:   ?"I want the voices to stop" ? ?HPI:  Jeremy Frank, 36 y.o., male patient seen by this provider; chart reviewed and consulted with Dr. Tamala Julian on 03/16/22.  Per chart review, patient has a history of PTSD,substance induced psychosis, cocaine dependence, and amphetamine abuse. On assessment Jeremy Frank reports that he has been hearing voices and he wants them to stop.  During evaluation Jeremy Frank is standing in the hall; he is easily directed when asked to have a seat to participate in the assessment. He is alert/oriented x 4; anxious and paranoid. He is cooperative; and mood is depressed with congruent affect.  Patient is speaking in a low tone at moderate volume, and normal pace; with fair to poor eye contact. His  thought process is linear; There is indication that he is currently responding to internal/external stimuli. There is no indication that he is experiencing delusional thought content.  Patient endorses suicidal/self-harm and denies homicidal ideation. There is evidence of psychosis, and paranoia.   ? ?Recommendation:  Inpatient psychiatric hospitalization ? ?Past Psychiatric History: unknown ? ?Risk to Self:   ?Risk to Others:   ?Prior Inpatient Therapy:   ?Prior Outpatient Therapy:   ? ?Past Medical History:  ?Past Medical History:  ?Diagnosis Date  ? Drug dependence ([place])   ? GI bleed   ? Hiatal hernia   ? Hypertension   ? Migraine   ? PTSD (post-traumatic stress disorder)   ?   ?Past Surgical History:  ?Procedure Laterality Date  ? KNEE SURGERY    ? ?Family History:  ?Family History  ?Problem Relation Age of Onset  ? Heart failure Mother   ? Hypertension Father   ? ?Family Psychiatric  History: unknown ?Social History:  ?Social History  ? ?Substance and Sexual Activity  ?Alcohol Use Yes  ? Alcohol/week: 42.0 standard drinks  ? Types: 42 Cans of beer per week  ? Comment: per pt he drinks 6-12 cans of beer daily  ?   ?Social History  ? ?Substance and Sexual Activity  ?Drug Use Yes  ? Types: Cocaine, Marijuana, Amphetamines  ?  ?Social History  ? ?Socioeconomic History  ? Marital status: Single  ?  Spouse name: Not on file  ? Number of children: Not on file  ? Years of education: Not on file  ? Highest education level: Not on file  ?Occupational History  ? Not on file  ?Tobacco Use  ? Smoking status: Every Day  ?  Packs/day: 0.25  ?  Types: Cigarettes  ? Smokeless tobacco: Never  ?Vaping Use  ? Vaping Use: Never used  ?Substance and Sexual Activity  ? Alcohol use: Yes  ?  Alcohol/week: 42.0 standard drinks  ?  Types: 42 Cans of beer per week  ?  Comment: per pt he drinks 6-12 cans of beer daily  ? Drug use: Yes  ?  Types: Cocaine, Marijuana, Amphetamines  ? Sexual activity: Not Currently  ?Other Topics Concern  ? Not on file  ?Social History Narrative  ? Not  on file  ? ?Social Determinants of Health  ? ?Financial Resource Strain: Not on file  ?Food Insecurity: Not on file  ?Transportation Needs: Not on file  ?Physical Activity: Not on file  ?Stress: Not on file  ?Social Connections: Not on file  ? ?Additional Social History: ?  ? ?Allergies:   ?Allergies  ?Allergen Reactions  ? Peanut-Containing Drug Products Diarrhea, Anaphylaxis and Swelling  ? Cheese Diarrhea  ? Eggs Or Egg-Derived Products Diarrhea  ? ? ?Labs:  ?Results for orders placed or performed during the hospital encounter of 03/15/22 (from the past 48 hour(s))  ?Comprehensive metabolic panel     Status: Abnormal  ? Collection  Time: 03/15/22 10:25 PM  ?Result Value Ref Range  ? Sodium 139 135 - 145 mmol/L  ? Potassium 3.7 3.5 - 5.1 mmol/L  ? Chloride 103 98 - 111 mmol/L  ? CO2 25 22 - 32 mmol/L  ? Glucose, Bld 96 70 - 99 mg/dL  ?  Comment: Glucose reference range applies only to samples taken after fasting for at least 8 hours.  ? BUN 19 6 - 20 mg/dL  ? Creatinine, Ser 1.63 (H) 0.61 - 1.24 mg/dL  ? Calcium 9.9 8.9 - 10.3 mg/dL  ? Total Protein 9.1 (H) 6.5 - 8.1 g/dL  ? Albumin 5.0 3.5 - 5.0 g/dL  ? AST 27 15 - 41 U/L  ? ALT 15 0 - 44 U/L  ? Alkaline Phosphatase 69 38 - 126 U/L  ? Total Bilirubin 1.0 0.3 - 1.2 mg/dL  ? GFR, Estimated 55 (L) >60 mL/min  ?  Comment: (NOTE) ?Calculated using the CKD-EPI Creatinine Equation (2021) ?  ? Anion gap 11 5 - 15  ?  Comment: Performed at [facility], [address] [place], [place] [address]  ?Ethanol     Status: None  ? Collection Time: 03/15/22 10:25 PM  ?Result Value Ref Range  ? Alcohol, Ethyl (B) <10 <10 mg/dL  ?  Comment: (NOTE) ?Lowest detectable limit for serum alcohol is 10 mg/dL. ? ?For medical purposes only. ?Performed at [facility], [place], ?[place] [address] ?  ?Salicylate level     Status: Abnormal  ? Collection Time: 03/15/22 10:25 PM  ?Result Value Ref Range  ? Salicylate Lvl [id number] (L) 7.0 - 30.0 mg/dL  ?  Comment: Performed at [facility], [address] [place], [place] [address]  ?Acetaminophen level     Status: Abnormal  ? Collection Time: 03/15/22 10:25 PM  ?Result Value Ref Range  ? Acetaminophen (Tylenol), Serum <10 (L) 10 - 30 ug/mL  ?  Comment: (NOTE) ?Therapeutic concentrations vary significantly. A range of 10-30 ug/mL  ?may be an effective concentration for many patients. However, some  ?are best treated at concentrations outside of this range. ?Acetaminophen concentrations >150 ug/mL at 4 hours after ingestion  ?and >50 ug/mL at 12 hours after ingestion are often associated with  ?toxic reactions. ? ?Performed at  [facility], [place], ?[place] [address] ?  ?cbc     Status: Abnormal  ? Collection Time: 03/15/22 10:25 PM  ?Result Value Ref Range  ? WBC 10.8 (H) 4.0 - 10.5 K/uL  ? RBC 4.92 4.22 - 5.81 MIL/uL  ? Hemoglobin 14.6 13.0 - 17.0 g/dL  ? HCT 43.6 39.0 - 52.0 %  ? MCV 88.6 80.0 - 100.0 fL  ? MCH 29.7 26.0 - 34.0 pg  ? MCHC 33.5 30.0 - 36.0 g/dL  ?  RDW 12.5 11.5 - 15.5 %  ? Platelets 220 150 - 400 K/uL  ? nRBC 0.0 0.0 - 0.2 %  ?  Comment: Performed at [facility], [address], [place], [place] [address]  ?Resp Panel by RT-PCR (Flu A&B, Covid) Nasopharyngeal Swab     Status: None  ? Collection Time: 03/15/22 11:35 PM  ? Specimen: Nasopharyngeal Swab; Nasopharyngeal(NP) swabs in vial transport medium  ?Result Value Ref Range  ? SARS Coronavirus 2 by RT PCR NEGATIVE NEGATIVE  ?  Comment: (NOTE) ?SARS-CoV-2 target nucleic acids are NOT DETECTED. ? ?The SARS-CoV-2 RNA is generally detectable in upper respiratory ?specimens during the acute phase of infection. The lowest ?concentration of SARS-CoV-2 viral copies this assay can detect is ?138 copies/mL. A negative result does not preclude SARS-Cov-2 ?infection and should not be used as the sole basis for treatment or ?other patient management decisions. A negative result may occur with  ?improper specimen collection/handling, submission of specimen other ?than nasopharyngeal swab, presence of viral mutation(s) within the ?areas targeted by this assay, and inadequate number of viral ?copies(<138 copies/mL). A negative result must be combined with ?clinical observations, patient history, and epidemiological ?information. The expected result is Negative. ? ?Fact Sheet for Patients:  ?EntrepreneurPulse.com.au ? ?Fact Sheet for Healthcare Providers:  ?IncredibleEmployment.be ? ?This test is no t yet approved or cleared by the [place] FDA and  ?has been authorized for detection and/or diagnosis of  SARS-CoV-2 by ?FDA under an Emergency Use Authorization (EUA). This EUA will remain  ?in effect (meaning this test can be used) for the duration of the ?COVID-19 declaration under Section 564(b)(1) of the Act, 21 ?[place]

## 2022-03-16 NOTE — BH Assessment (Signed)
This Clinical research associate spoke with [facility] representative Verdon Cummins [phone] ext [phone]) who reported the facility is currently on diversion.  ? ?Clinical research associate also contacted [facility] [phone]). Representative Sue Lush also reported that there are no mental health beds available.  ? ?Unable to reach [facility] [phone]) ?

## 2022-03-16 NOTE — ED Notes (Signed)
IVC/Consult completed/Rec. Inpt Admit °

## 2022-03-16 NOTE — ED Notes (Signed)
Breakfast tray given. °

## 2022-03-16 NOTE — ED Notes (Signed)
Lunch tray given. 

## 2022-03-16 NOTE — ED Notes (Signed)
Patient paranoid at this time, states he is afraid someone is coming to get him and try to kill him.  Patient also reports seeing blood all over treatment room.  Attempted to reassure patient patient then looks at this RN and say "pure evil".   ?

## 2022-03-16 NOTE — Consult Note (Signed)
[facility] Face-to-Face Psychiatry Consult  ? ?Reason for Consult:  stimulant abuse with psychosis ?Referring Physician:  EDP ?Patient Identification: Jeremy Frank ?MRN:  [id number] ?Principal Diagnosis: Amphetamine and psychostimulant-induced psychosis with hallucinations (HCC) ?Diagnosis:  Principal Problem: ?  Amphetamine and psychostimulant-induced psychosis with hallucinations (HCC) ?Active Problems: ?  Posttraumatic stress disorder ?  Traumatic brain injury [facility]) ?  Cocaine dependence (HCC) ?  Amphetamine abuse (HCC) ? ? ?Total Time spent with patient: 1 hour ? ?Subjective:   ?Jeremy Frank is a 36 y.o. male patient admitted with psychosis after using meth and cocaine. ? ?HPI:  36 yo male presented to the ED with psychosis after using meth and cocaine.  He was discharged last month for similar issues.  He was given agitation medications on admission last night and was asleep most of the day.  Revisited him now and he reports living with a friend and receiving care at the [facility].  Denies suicidal/homicidal ideations, hallucinations, and withdrawal symptoms.  He initially wanted to discharge today but feels he needs another night for the agitation medications to wear off.  Client is agreeable to discharge in the am and follow up with the [facility], declined rehab. ? ?Past Psychiatric History: stimulant use disorder ? ?Risk to Self:  none ?Risk to Others:  none ?Prior Inpatient Therapy:  several detox/rehab, substance related ?[facility] ? ?Past Medical History:  ?Past Medical History:  ?Diagnosis Date  ? Drug dependence (HCC)   ? GI bleed   ? Hiatal hernia   ? Hypertension   ? Migraine   ? PTSD (post-traumatic stress disorder)   ?  ?Past Surgical History:  ?Procedure Laterality Date  ? KNEE SURGERY    ? ?Family History:  ?Family History  ?Problem Relation Age of Onset  ? Heart failure Mother   ? Hypertension Father   ? ?Family Psychiatric  History: none ?Social History:  ?Social History  ? ?Substance and  Sexual Activity  ?Alcohol Use Yes  ? Alcohol/week: 42.0 standard drinks  ? Types: 42 Cans of beer per week  ? Comment: per pt he drinks 6-12 cans of beer daily  ?   ?Social History  ? ?Substance and Sexual Activity  ?Drug Use Yes  ? Types: Cocaine, Marijuana, Amphetamines  ?  ?Social History  ? ?Socioeconomic History  ? Marital status: Single  ?  Spouse name: Not on file  ? Number of children: Not on file  ? Years of education: Not on file  ? Highest education level: Not on file  ?Occupational History  ? Not on file  ?Tobacco Use  ? Smoking status: Every Day  ?  Packs/day: 0.25  ?  Types: Cigarettes  ? Smokeless tobacco: Never  ?Vaping Use  ? Vaping Use: Never used  ?Substance and Sexual Activity  ? Alcohol use: Yes  ?  Alcohol/week: 42.0 standard drinks  ?  Types: 42 Cans of beer per week  ?  Comment: per pt he drinks 6-12 cans of beer daily  ? Drug use: Yes  ?  Types: Cocaine, Marijuana, Amphetamines  ? Sexual activity: Not Currently  ?Other Topics Concern  ? Not on file  ?Social History Narrative  ? Not on file  ? ?Social Determinants of Health  ? ?Financial Resource Strain: Not on file  ?Food Insecurity: Not on file  ?Transportation Needs: Not on file  ?Physical Activity: Not on file  ?Stress: Not on file  ?Social Connections: Not on file  ? ?Additional  Social History: ?  ? ?Allergies:   ?Allergies  ?Allergen Reactions  ? Peanut-Containing Drug Products Diarrhea, Anaphylaxis and Swelling  ? Cheese Diarrhea  ? Eggs Or Egg-Derived Products Diarrhea  ? ? ?Labs:  ?Results for orders placed or performed during the hospital encounter of 03/15/22 (from the past 48 hour(s))  ?Comprehensive metabolic panel     Status: Abnormal  ? Collection Time: 03/15/22 10:25 PM  ?Result Value Ref Range  ? Sodium 139 135 - 145 mmol/L  ? Potassium 3.7 3.5 - 5.1 mmol/L  ? Chloride 103 98 - 111 mmol/L  ? CO2 25 22 - 32 mmol/L  ? Glucose, Bld 96 70 - 99 mg/dL  ?  Comment: Glucose reference range applies only to samples taken after fasting  for at least 8 hours.  ? BUN 19 6 - 20 mg/dL  ? Creatinine, Ser 1.63 (H) 0.61 - 1.24 mg/dL  ? Calcium 9.9 8.9 - 10.3 mg/dL  ? Total Protein 9.1 (H) 6.5 - 8.1 g/dL  ? Albumin 5.0 3.5 - 5.0 g/dL  ? AST 27 15 - 41 U/L  ? ALT 15 0 - 44 U/L  ? Alkaline Phosphatase 69 38 - 126 U/L  ? Total Bilirubin 1.0 0.3 - 1.2 mg/dL  ? GFR, Estimated 55 (L) >60 mL/min  ?  Comment: (NOTE) ?Calculated using the CKD-EPI Creatinine Equation (2021) ?  ? Anion gap 11 5 - 15  ?  Comment: Performed at [facility], [address], [place], [place] [address]  ?Ethanol     Status: None  ? Collection Time: 03/15/22 10:25 PM  ?Result Value Ref Range  ? Alcohol, Ethyl (B) <10 <10 mg/dL  ?  Comment: (NOTE) ?Lowest detectable limit for serum alcohol is 10 mg/dL. ? ?For medical purposes only. ?Performed at [facility], [address] [place], ?[place] [address] ?  ?Salicylate level     Status: Abnormal  ? Collection Time: 03/15/22 10:25 PM  ?Result Value Ref Range  ? Salicylate Lvl <7.0 (L) 7.0 - 30.0 mg/dL  ?  Comment: Performed at [facility], [address], [place], [place] [address]  ?Acetaminophen level     Status: Abnormal  ? Collection Time: 03/15/22 10:25 PM  ?Result Value Ref Range  ? Acetaminophen (Tylenol), Serum <10 (L) 10 - 30 ug/mL  ?  Comment: (NOTE) ?Therapeutic concentrations vary significantly. A range of 10-30 ug/mL  ?may be an effective concentration for many patients. However, some  ?are best treated at concentrations outside of this range. ?Acetaminophen concentrations >150 ug/mL at 4 hours after ingestion  ?and >50 ug/mL at 12 hours after ingestion are often associated with  ?toxic reactions. ? ?Performed at [facility], [address] [place], ?[place] [address] ?  ?cbc     Status: Abnormal  ? Collection Time: 03/15/22 10:25 PM  ?Result Value Ref Range  ? WBC 10.8 (H) 4.0 - 10.5 K/uL  ? RBC 4.92 4.22 - 5.81 MIL/uL  ? Hemoglobin 14.6 13.0 - 17.0 g/dL  ? HCT 43.6 39.0 - 52.0 %  ? MCV  88.6 80.0 - 100.0 fL  ? MCH 29.7 26.0 - 34.0 pg  ? MCHC 33.5 30.0 - 36.0 g/dL  ? RDW 12.5 11.5 - 15.5 %  ? Platelets 220 150 - 400 K/uL  ? nRBC 0.0 0.0 - 0.2 %  ?  Comment: Performed at [facility], [address] [place], [place] [address]  ?Resp Panel by RT-PCR (Flu A&B, Covid) Nasopharyngeal  Swab     Status: None  ? Collection Time: 03/15/22 11:35 PM  ? Specimen: Nasopharyngeal Swab; Nasopharyngeal(NP) swabs in vial transport medium  ?Result Value Ref Range  ? SARS Coronavirus 2 by RT PCR NEGATIVE NEGATIVE  ?  Comment: (NOTE) ?SARS-CoV-2 target nucleic acids are NOT DETECTED. ? ?The SARS-CoV-2 RNA is generally detectable in upper respiratory ?specimens during the acute phase of infection. The lowest ?concentration of SARS-CoV-2 viral copies this assay can detect is ?138 copies/mL. A negative result does not preclude SARS-Cov-2 ?infection and should not be used as the sole basis for treatment or ?other patient management decisions. A negative result may occur with  ?improper specimen collection/handling, submission of specimen other ?than nasopharyngeal swab, presence of viral mutation(s) within the ?areas targeted by this assay, and inadequate number of viral ?copies(<138 copies/mL). A negative result must be combined with ?clinical observations, patient history, and epidemiological ?information. The expected result is Negative. ? ?Fact Sheet for Patients:  ?BloggerCourse.com ? ?Fact Sheet for Healthcare Providers:  ?SeriousBroker.it ? ?This test is no t yet approved or cleared by the [place] FDA and  ?has been authorized for detection and/or diagnosis of SARS-CoV-2 by ?FDA under an Emergency Use Authorization (EUA). This EUA will remain  ?in effect (meaning this test can be used) for the duration of the ?COVID-19 declaration under Section 564(b)(1) of the Act, 21 ?U.S.C.section 360bbb-3(b)(1), unless the authorization is terminated  ?or revoked  sooner.  ? ? ?  ? Influenza A by PCR NEGATIVE NEGATIVE  ? Influenza B by PCR NEGATIVE NEGATIVE  ?  Comment: (NOTE) ?The Xpert Xpress SARS-CoV-2/FLU/RSV plus assay is intended as an aid ?in the diagnosi

## 2022-03-17 NOTE — ED Notes (Signed)
IVC/pending d.c. this AM. ?

## 2022-03-17 NOTE — ED Notes (Signed)
Belongings bag 1/1 returned to patient at time of discharge 

## 2022-03-17 NOTE — ED Notes (Signed)
E-signature not working at this time. Pt verbalized understanding of D/C instructions, prescriptions and follow up care with no further questions at this time. Pt in NAD and ambulatory at time of D/C.  

## 2022-03-17 NOTE — ED Provider Notes (Addendum)
Emergency Medicine Observation Re-evaluation Note ? ?Jeremy Frank is a 36 y.o. male, seen on rounds today.  Pt initially presented to the ED for complaints of Hallucinations ?Currently, the patient is resting calmly ? ?Physical Exam  ?BP 115/68 (BP Location: Right Arm)   Pulse 82   Temp 98.2 ?F (36.8 ?C)   Resp 14   Ht 5\' 11"  (1.803 m)   Wt 95.3 kg   SpO2 96%   BMI 29.29 kg/m?  ?Physical Exam ?General: no acute distress ?Psych: calm ? ?ED Course / MDM  ?EKG:  ? ?I have reviewed the labs performed to date as well as medications administered while in observation.  Recent changes in the last 24 hours include none. ? ?Plan  ?Current plan is for discharge in Am. ? is not under involuntary commitment. ?  ? ?  ?Earvin Hansen, MD ?03/17/22 [phone] ? ?  ?[id number], MD ?03/17/22 [phone] ? ?

## 2022-03-17 NOTE — ED Notes (Signed)
IVC rescinded/pt VOL/pending d.c. ?

## 2022-04-01 ENCOUNTER — Emergency Department
Admission: EM | Admit: 2022-04-01 | Discharge: 2022-04-01 | Disposition: A | Discharge status: Home / Self Care | Payer: No Typology Code available for payment source | Attending: Emergency Medicine | Admitting: Emergency Medicine

## 2022-04-01 ENCOUNTER — Other Ambulatory Visit: Payer: Self-pay

## 2022-04-01 DIAGNOSIS — F29 Unspecified psychosis not due to a substance or known physiological condition: Secondary | ICD-10-CM

## 2022-04-01 DIAGNOSIS — Z20822 Contact with and (suspected) exposure to covid-19: Secondary | ICD-10-CM | POA: Diagnosis not present

## 2022-04-01 DIAGNOSIS — F15150 Other stimulant abuse with stimulant-induced psychotic disorder with delusions: Secondary | ICD-10-CM | POA: Diagnosis not present

## 2022-04-01 DIAGNOSIS — F19150 Other psychoactive substance abuse with psychoactive substance-induced psychotic disorder with delusions: Secondary | ICD-10-CM | POA: Diagnosis not present

## 2022-04-01 DIAGNOSIS — F17210 Nicotine dependence, cigarettes, uncomplicated: Secondary | ICD-10-CM | POA: Insufficient documentation

## 2022-04-01 DIAGNOSIS — R443 Hallucinations, unspecified: Secondary | ICD-10-CM | POA: Diagnosis present

## 2022-04-01 DIAGNOSIS — I10 Essential (primary) hypertension: Secondary | ICD-10-CM | POA: Diagnosis not present

## 2022-04-01 DIAGNOSIS — F19950 Other psychoactive substance use, unspecified with psychoactive substance-induced psychotic disorder with delusions: Secondary | ICD-10-CM | POA: Diagnosis present

## 2022-04-01 LAB — URINE DRUG SCREEN, QUALITATIVE (ARMC ONLY)
Amphetamines, Ur Screen: POSITIVE — AB
Barbiturates, Ur Screen: NOT DETECTED
Benzodiazepine, Ur Scrn: NOT DETECTED
Cannabinoid 50 Ng, Ur ~~LOC~~: POSITIVE — AB
Cocaine Metabolite,Ur ~~LOC~~: POSITIVE — AB
MDMA (Ecstasy)Ur Screen: NOT DETECTED
Methadone Scn, Ur: NOT DETECTED
Opiate, Ur Screen: NOT DETECTED
Phencyclidine (PCP) Ur S: NOT DETECTED
Tricyclic, Ur Screen: NOT DETECTED

## 2022-04-01 LAB — CBC
HCT: 41.1 % (ref 39.0–52.0)
Hemoglobin: 13.6 g/dL (ref 13.0–17.0)
MCH: 29.2 pg (ref 26.0–34.0)
MCHC: 33.1 g/dL (ref 30.0–36.0)
MCV: 88.2 fL (ref 80.0–100.0)
Platelets: 232 10*3/uL (ref 150–400)
RBC: 4.66 MIL/uL (ref 4.22–5.81)
RDW: 13 % (ref 11.5–15.5)
WBC: 11.1 10*3/uL — ABNORMAL HIGH (ref 4.0–10.5)
nRBC: 0 % (ref 0.0–0.2)

## 2022-04-01 LAB — RESP PANEL BY RT-PCR (FLU A&B, COVID) ARPGX2
Influenza A by PCR: NEGATIVE
Influenza B by PCR: NEGATIVE
SARS Coronavirus 2 by RT PCR: NEGATIVE

## 2022-04-01 LAB — COMPREHENSIVE METABOLIC PANEL
ALT: 19 U/L (ref 0–44)
AST: 31 U/L (ref 15–41)
Albumin: 5.1 g/dL — ABNORMAL HIGH (ref 3.5–5.0)
Alkaline Phosphatase: 72 U/L (ref 38–126)
Anion gap: 12 (ref 5–15)
BUN: 23 mg/dL — ABNORMAL HIGH (ref 6–20)
CO2: 22 mmol/L (ref 22–32)
Calcium: 9.8 mg/dL (ref 8.9–10.3)
Chloride: 104 mmol/L (ref 98–111)
Creatinine, Ser: 1.63 mg/dL — ABNORMAL HIGH (ref 0.61–1.24)
GFR, Estimated: 55 mL/min — ABNORMAL LOW (ref >60)
Glucose, Bld: 104 mg/dL — ABNORMAL HIGH (ref 70–99)
Potassium: 3.7 mmol/L (ref 3.5–5.1)
Sodium: 138 mmol/L (ref 135–145)
Total Bilirubin: 1 mg/dL (ref 0.3–1.2)
Total Protein: 9 g/dL — ABNORMAL HIGH (ref 6.5–8.1)

## 2022-04-01 LAB — SALICYLATE LEVEL: Salicylate Lvl: <7 mg/dL — ABNORMAL LOW (ref 7.0–30.0)

## 2022-04-01 LAB — ACETAMINOPHEN LEVEL: Acetaminophen (Tylenol), Serum: <10 ug/mL — ABNORMAL LOW (ref 10–30)

## 2022-04-01 LAB — ETHANOL: Alcohol, Ethyl (B): <10 mg/dL (ref <10)

## 2022-04-01 MED ORDER — LORAZEPAM 2 MG/ML IJ SOLN
2.0000 mg | Freq: Once | INTRAMUSCULAR | Status: AC
  Administered 2022-04-01: 2 mg via INTRAMUSCULAR
  Filled 2022-04-01: qty 1

## 2022-04-01 NOTE — ED Notes (Signed)
Pt. Transferred to BHU , room# 3 from ED .Patient was screened by security before entering the unit. Report to include Situation, Background, Assessment and Recommendations from Hillery Aldo, RN . Pt. Oriented to unit including Q15 minute rounds as well as the security cameras for their protection. Patient is alert and oriented, warm and dry in no acute distress.  ? ?

## 2022-04-01 NOTE — ED Notes (Signed)
Patient redirected by writer to stay in room and try to lay down and rest. Patient asking writer if I am feeling the vibrations.  Writer advised there is not vibrations. He needs to lay down and rest.  ?

## 2022-04-01 NOTE — ED Notes (Signed)
pt A/Ox3 at times of discharge. Pt currently denies all SI/HI reports no current A/V hallucinations.  Pt left ambulatory on his own free will. ?

## 2022-04-01 NOTE — ED Triage Notes (Addendum)
Pt presents to ER via ems c/o anxiety, and paranoia that has been going on all day.  Pt states he has been hearing voices today but will not say what they have been telling him.  Pt noted to be in triage room looking around, acting paranoid.  Pt repeatedly asking if anything is going to hurt him here.  Pt walking around room and difficult to redirect to sit down.  Pt in NAD at this time.  Pt denies SI/HI and states he has had some alcohol, marijuana and something else but "doesn't know what it was." ?

## 2022-04-01 NOTE — BH Assessment (Signed)
Psych Team attempted to interview patient. Patient would not participate in assessment. Patient would not remove blanket from his head but verbally stated he was awake.  ?

## 2022-04-01 NOTE — BH Assessment (Signed)
Comprehensive Clinical Assessment (CCA) Screening, Triage and Referral Note ? ?04/01/2022 ?Jeremy Frank ?[id number] ? ?Jeremy Frank, 36 year old male who presents to [facility] ED voluntarily for treatment. Per triage note, Pt presents to ER via EMS c/o anxiety, and paranoia that has been going on all day.  Pt states he has been hearing voices today but will not say what they have been telling him.  Pt noted to be in triage room looking around, acting paranoid.  Pt repeatedly asking if anything is going to hurt him here.  Pt walking around room and difficult to redirect to sit down.  Pt in NAD at this time.  Pt denies SI/HI and states he has had some alcohol, marijuana, and something else but "doesn't know what it was."  ? ?During TTS assessment pt presents alert and oriented x 4, restless but cooperative, and mood-congruent with affect. The pt does not appear to be responding to internal or external stimuli. Neither is the pt presenting with any delusional thinking. Pt verified the information provided to triage RN.  ? ?Pt identifies his main complaint to be that he was hallucinating and called 911. Patient reports using several substances and was seen last month for similar symptoms. Patient is not currently taking any medication and does not believe drugs is the cause for his paranoia. Patient admits to using amphetamines, cocaine, and marijuana. Patient reports he has an appointment on 04/08/22 at [place], [place] for mental health. Pt denies current SI/HI/AH/VH. Pt contracts for safety.  ? ?Per Jeremy Ober, NP, pt does not meet criteria for inpatient psychiatric admission.   ? ?Chief Complaint:  ?Chief Complaint  ?Patient presents with  ? Paranoid  ? 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 ?Patient Reported Information ?How did you hear about [place]? Self ? ?What Is the Reason for Your Visit/Call Today? Patient reports he called EMS because he was having visual  hallucinations. ? ?How Long Has This Been Causing You Problems? <Week ? ?What Do You Feel Would Help You the Most Today? Treatment for Depression or other mood problem; Medication(s) ? ? ?Have You Recently Had Any Thoughts About Hurting Yourself? No ? ?Are You Planning to Commit Suicide/Harm Yourself At This time? No ? ? ?Have you Recently Had Thoughts About Hurting Someone Jeremy Frank? No ? ?Are You Planning to Harm Someone at This Time? No ? ?Explanation: No data recorded ? ?Have You Used Any Alcohol or Drugs in the Past 24 Hours? Yes ? ?How Long Ago Did You Use Drugs or Alcohol? No data recorded ?What Did You Use and How Much? Amphetamines, cocaine, marijuana/ amounts unknown ? ? ?Do You Currently Have a Therapist/Psychiatrist? Yes ? ?Name of Therapist/Psychiatrist: [facility] ? ? ?Have You Been Recently Discharged From Any Office Practice or Programs? No ? ?Explanation of Discharge From Practice/Program: No data recorded ?  ?CCA Screening Triage Referral Assessment ?Type of Contact: Face-to-Face ? ?Telemedicine Service Delivery:   ?Is this Initial or Reassessment? No data recorded ?Date Telepsych consult ordered in CH[facility]:  No data recorded ?Time Telepsych consult ordered in CHL:  No data recorded ?Location of Assessment: [facility] ED ? ?Provider Location: [facility] ED ? ? ?Collateral Involvement: None provided ? ? ?Does Patient Have a Automotive engineer Guardian? No data recorded ?Name and Contact of Legal Guardian: No data recorded ?If Minor and Not Living with Parent(s), Who has Custody? n/a ? ?Is CPS involved or ever been involved? Never ? ?Is APS involved or ever been involved? Never ? ? ?  Patient Determined To Be At Risk for Harm To Self or Others Based on Review of Patient Reported Information or Presenting Complaint? No ? ?Method: No data recorded ?Availability of Means: No data recorded ?Intent: No data recorded ?Notification Required: No data recorded ?Additional Information for Danger to Others Potential: No data  recorded ?Additional Comments for Danger to Others Potential: No data recorded ?Are There Guns or Other Weapons in Your Home? No data recorded ?Types of Guns/Weapons: No data recorded ?Are These Weapons Safely Secured?                            No data recorded ?Who Could Verify You Are Able To Have These Secured: No data recorded ?Do You Have any Outstanding Charges, Pending Court Dates, Parole/Probation? No data recorded ?Contacted To Inform of Risk of Harm To Self or Others: No data recorded ? ?Does Patient Present under Involuntary Commitment? No ? ?IVC Papers Initial File Date: 12/30/21 ? ? ?[place] of Residence: [place] ? ? ?Patient Currently Receiving the Following Services: Medication Management ? ? ?Determination of Need: Urgent (48 hours) ? ? ?Options For Referral: ED Referral; Therapeutic Triage Services; Outpatient Therapy; Medication Management ? ? ?Discharge Disposition:  ?  ? ?Jeremy Frank Jeremy Frank, Counselor, LCAS-A ? ? ?  ?  ?  ? ? ?

## 2022-04-01 NOTE — ED Notes (Signed)
Hospital meal provided, pt tolerated w/o complaints.  Waste discarded appropriately.  

## 2022-04-01 NOTE — ED Provider Notes (Signed)
? ?[facility] ?Provider Note ? ? ? Event Date/Time  ? First MD Initiated Contact with Patient 04/01/22 0056   ?  (approximate) ? ? ?History  ? ?Paranoid ? ? ?HPI ? ?Jeremy Frank is a 36 y.o. male with a history of polysubstance abuse, TBI, and psychosis who presents with anxiety, paranoia, and hallucinations.  The patient states he is concerned somebody is trying to kill him.  He states that he took something which was not a drug but which was possibly aluminum shavings, although it was labeled "methamphetamine."  However he does not elaborate on this.  He denies alcohol or other drug use. ? ? ? ?Physical Exam  ? ?Triage Vital Signs: ?ED Triage Vitals [04/01/22 0034]  ?Enc Vitals Group  ?   BP (!) 143/114  ?   Pulse Rate (!) 129  ?   Resp (!) 22  ?   Temp 98.2 ?F (36.8 ?C)  ?   Temp Source Oral  ?   SpO2 99 %  ?   Weight   ?   Height   ?   Head Circumference   ?   Peak Flow   ?   Pain Score 0  ?   Pain Loc   ?   Pain Edu?   ?   Excl. in GC?   ? ? ?Most recent vital signs: ?Vitals:  ? 04/01/22 0034  ?BP: (!) 143/114  ?Pulse: (!) 129  ?Resp: (!) 22  ?Temp: 98.2 ?F (36.8 ?C)  ?SpO2: 99%  ? ? ? ?General: Alert, anxious appearing. ?CV:  Good peripheral perfusion.  ?Resp:  Normal effort.  ?Abd:  No distention.  ?Other:  Anxious appearing, intermittently pacing around the room ? ? ?ED Results / Procedures / Treatments  ? ?Labs ?(all labs ordered are listed, but only abnormal results are displayed) ?Labs Reviewed  ?COMPREHENSIVE METABOLIC PANEL - Abnormal; Notable for the following components:  ?    Result Value  ? Glucose, Bld 104 (*)   ? BUN 23 (*)   ? Creatinine, Ser 1.63 (*)   ? Total Protein 9.0 (*)   ? Albumin 5.1 (*)   ? GFR, Estimated 55 (*)   ? All other components within normal limits  ?SALICYLATE LEVEL - Abnormal; Notable for the following components:  ? Salicylate Lvl <7.0 (*)   ? All other components within normal limits  ?ACETAMINOPHEN LEVEL - Abnormal; Notable for the following  components:  ? Acetaminophen (Tylenol), Serum <10 (*)   ? All other components within normal limits  ?CBC - Abnormal; Notable for the following components:  ? WBC 11.1 (*)   ? All other components within normal limits  ?URINE DRUG SCREEN, QUALITATIVE ([facility] ONLY) - Abnormal; Notable for the following components:  ? Amphetamines, Ur Screen POSITIVE (*)   ? Cocaine Metabolite,Ur [place] POSITIVE (*)   ? Cannabinoid 50 Ng, Ur  POSITIVE (*)   ? All other components within normal limits  ?RESP PANEL BY RT-PCR (FLU A&B, COVID) ARPGX2  ?ETHANOL  ? ? ? ?EKG ? ? ? ? ?RADIOLOGY ? ? ? ?PROCEDURES: ? ?Critical Care performed: No ? ?Procedures ? ? ?MEDICATIONS ORDERED IN ED: ?Medications  ?LORazepam (ATIVAN) injection 2 mg (2 mg Intramuscular Given 04/01/22 0118)  ?LORazepam (ATIVAN) injection 2 mg (2 mg Intramuscular Given 04/01/22 0228)  ? ? ? ?IMPRESSION / MDM / ASSESSMENT AND PLAN / ED COURSE  ?I reviewed the triage vital signs and the nursing notes. ? ?  36 year old male with PMH as noted above presents with anxiety and hallucinations, feeling that somebody is trying to kill him.  He endorses possible methamphetamine use. ? ?I reviewed the past medical records; the patient was seen in the ED with a similar presentation on 3/18.  Per the psychiatry consult note, he was diagnosed with methamphetamine induced psychosis and ultimately was discharged home. ? 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  The patient appears very anxious, paranoid, pacing around the room, not answering my questions directly, and unable to contract for safety.  I am concerned for acute danger to self and others and have placed the patient under involuntary commitment. ? ?I have ordered psychiatry and TTS consultations as well as lab work-up for medical clearance.  Disposition will based on psychiatry team  recommendations ? ?----------------------------------------- ?7:13 AM on 04/01/2022 ?----------------------------------------- ? ?Lab work-up is unremarkable.  The patient is awaiting psychiatry evaluation.  I signed him out to the oncoming ED physician Dr. Darnelle Catalan. ? ? ?FINAL CLINICAL IMPRESSION(S) / ED DIAGNOSES  ? ?Final diagnoses:  ?Psychosis, unspecified psychosis type (HCC)  ? ? ? ?Rx / DC Orders  ? ?ED Discharge Orders   ? ? None  ? ?  ? ? ? ?Note:  This document was prepared using Dragon voice recognition software and may include unintentional dictation errors.  ?  Dionne Bucy, MD ?04/01/22 [phone] ? ?

## 2022-04-01 NOTE — Consult Note (Signed)
[facility] Face-to-Face Psychiatry Consult  ? ?Reason for Consult:  substance induced psychosis ?Referring Physician:  EDP ?Patient Identification: Jeremy Frank ?MRN:  [id number] ?Principal Diagnosis: 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HCC) ?Diagnosis:  Principal Problem: ?  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HCC) ? ? ?Total Time spent with patient: 45 minutes ? ?Subjective:   ?Jeremy Frank is a 36 y.o. male patient admitted with substance-induced psychosis. ? ?HPI:  Patient seen and chart reviewed.  Patient currently denies any auditory or visual hallucinations.  Denies suicidal or homicidal ideations.  Patient has been in the ED for the entire day.  He was sleeping, states that he feels better.  He states that he was hallucinating last night so he called 911 to go to the hospital.  He admits to using substances.  UDS is positive for amphetamines, cocaine, and marijuana.  Patient states that he feels safe for discharge and is going to go home.  He is no longer showing signs of paranoia.  Does not appear to be responding to internal stimuli. ? ?Past Psychiatric History: see previous ? ?Risk to Self:   ?Risk to Others:   ?Prior Inpatient Therapy:   ?Prior Outpatient Therapy:   ? ?Past Medical History:  ?Past Medical History:  ?Diagnosis Date  ? Drug dependence (HCC)   ? GI bleed   ? Hiatal hernia   ? Hypertension   ? Migraine   ? PTSD (post-traumatic stress disorder)   ?  ?Past Surgical History:  ?Procedure Laterality Date  ? KNEE SURGERY    ? ?Family History:  ?Family History  ?Problem Relation Age of Onset  ? Heart failure Mother   ? Hypertension Father   ? ?Family Psychiatric  History:  ?Social History:  ?Social History  ? ?Substance and Sexual Activity  ?Alcohol Use Yes  ? Alcohol/week: 42.0 standard drinks  ? Types: 42 Cans of beer per week  ? Comment: per pt he drinks 6-12 cans of beer daily  ?   ?Social History  ? ?Substance and Sexual Activity  ?Drug Use Yes  ? Types: Cocaine,  Marijuana, Amphetamines  ?  ?Social History  ? ?Socioeconomic History  ? Marital status: Single  ?  Spouse name: Not on file  ? Number of children: Not on file  ? Years of education: Not on file  ? Highest education level: Not on file  ?Occupational History  ? Not on file  ?Tobacco Use  ? Smoking status: Every Day  ?  Packs/day: 0.25  ?  Types: Cigarettes  ? Smokeless tobacco: Never  ?Vaping Use  ? Vaping Use: Never used  ?Substance and Sexual Activity  ? Alcohol use: Yes  ?  Alcohol/week: 42.0 standard drinks  ?  Types: 42 Cans of beer per week  ?  Comment: per pt he drinks 6-12 cans of beer daily  ? Drug use: Yes  ?  Types: Cocaine, Marijuana, Amphetamines  ? Sexual activity: Not Currently  ?Other Topics Concern  ? Not on file  ?Social History Narrative  ? Not on file  ? ?Social Determinants of Health  ? ?Financial Resource Strain: Not on file  ?Food Insecurity: Not on file  ?Transportation Needs: Not on file  ?Physical Activity: Not on file  ?Stress: Not on file  ?Social Connections: Not on file  ? ?Additional Social History: ?  ? ?Allergies:   ?Allergies  ?Allergen Reactions  ? Peanut-Containing Drug Products Diarrhea, Anaphylaxis and Swelling  ? Cheese Diarrhea  ? Eggs Or  Egg-Derived Products Diarrhea  ? ? ?Labs:  ?Results for orders placed or performed during the hospital encounter of 04/01/22 (from the past 48 hour(s))  ?Comprehensive metabolic panel     Status: Abnormal  ? Collection Time: 04/01/22  1:19 AM  ?Result Value Ref Range  ? Sodium 138 135 - 145 mmol/L  ? Potassium 3.7 3.5 - 5.1 mmol/L  ? Chloride 104 98 - 111 mmol/L  ? CO2 22 22 - 32 mmol/L  ? Glucose, Bld 104 (H) 70 - 99 mg/dL  ?  Comment: Glucose reference range applies only to samples taken after fasting for at least 8 hours.  ? BUN 23 (H) 6 - 20 mg/dL  ? Creatinine, Ser 1.63 (H) 0.61 - 1.24 mg/dL  ? Calcium 9.8 8.9 - 10.3 mg/dL  ? Total Protein 9.0 (H) 6.5 - 8.1 g/dL  ? Albumin 5.1 (H) 3.5 - 5.0 g/dL  ? AST 31 15 - 41 U/L  ? ALT 19 0 - 44 U/L   ? Alkaline Phosphatase 72 38 - 126 U/L  ? Total Bilirubin 1.0 0.3 - 1.2 mg/dL  ? GFR, Estimated 55 (L) >60 mL/min  ?  Comment: (NOTE) ?Calculated using the CKD-EPI Creatinine Equation (2021) ?  ? Anion gap 12 5 - 15  ?  Comment: Performed at [facility], [address] [place], [place] [address]  ?Ethanol     Status: None  ? Collection Time: 04/01/22  1:19 AM  ?Result Value Ref Range  ? Alcohol, Ethyl (B) <10 <10 mg/dL  ?  Comment: (NOTE) ?Lowest detectable limit for serum alcohol is 10 mg/dL. ? ?For medical purposes only. ?Performed at [facility], [address] [place], ?[place] [address] ?  ?Salicylate level     Status: Abnormal  ? Collection Time: 04/01/22  1:19 AM  ?Result Value Ref Range  ? Salicylate Lvl <7.0 (L) 7.0 - 30.0 mg/dL  ?  Comment: Performed at [facility], [address] [place], [place] [address]  ?Acetaminophen level     Status: Abnormal  ? Collection Time: 04/01/22  1:19 AM  ?Result Value Ref Range  ? Acetaminophen (Tylenol), Serum <10 (L) 10 - 30 ug/mL  ?  Comment: (NOTE) ?Therapeutic concentrations vary significantly. A range of 10-30 ug/mL  ?may be an effective concentration for many patients. However, some  ?are best treated at concentrations outside of this range. ?Acetaminophen concentrations >150 ug/mL at 4 hours after ingestion  ?and >50 ug/mL at 12 hours after ingestion are often associated with  ?toxic reactions. ? ?Performed at [facility], [address] [place], ?[place] [address] ?  ?cbc     Status: Abnormal  ? Collection Time: 04/01/22  1:19 AM  ?Result Value Ref Range  ? WBC 11.1 (H) 4.0 - 10.5 K/uL  ? RBC 4.66 4.22 - 5.81 MIL/uL  ? Hemoglobin 13.6 13.0 - 17.0 g/dL  ? HCT 41.1 39.0 - 52.0 %  ? MCV 88.2 80.0 - 100.0 fL  ? MCH 29.2 26.0 - 34.0 pg  ? MCHC 33.1 30.0 - 36.0 g/dL  ? RDW 13.0 11.5 - 15.5 %  ? Platelets 232 150 - 400 K/uL  ? nRBC 0.0 0.0 - 0.2 %  ?  Comment: Performed at [facility], [address]  [place], [place] [address]  ?Urine Drug Screen, Qualitative     Status: Abnormal  ? Collection Time: 04/01/22  1:23 AM  ?Result Value Ref Range  ? Tricyclic, Ur Screen NONE  DETECTED NONE DETECTED  ? Amphetamines, Ur Screen POSITIVE (A) NONE DETECTED  ? MDMA (Ecstasy)Ur Screen NONE DETECTED NONE DETECTED  ? Cocaine Metabolite,Ur [place] POSITIVE (A) NONE DETECTED  ? Opiate, Ur Screen NONE DETECTED NONE DETECTED  ? Phencyclidine (PCP) Ur S NONE DETECTED NONE DETECTED  ? Cannabinoid 50 Ng, Ur [place] POSITIVE (A) NONE DETECTED  ? Barbiturates, Ur Screen NONE DETECTED NONE DETECTED  ? Benzodiazepine, Ur Scrn NONE DETECTED NONE DETECTED  ? Methadone Scn, Ur NONE DETECTED NONE DETECTED  ?  Comment: (NOTE) ?Tricyclics + metabolites, urine    Cutoff 1000 ng/mL ?Amphetamines + metabolites, urine  Cutoff 1000 ng/mL ?MDMA (Ecstasy), urine              Cutoff 500 ng/mL ?Cocaine Metabolite, urine          Cutoff 300 ng/mL ?Opiate + metabolites, urine        Cutoff 300 ng/mL ?Phencyclidine (PCP), urine         Cutoff 25 ng/mL ?Cannabinoid, urine                 Cutoff 50 ng/mL ?Barbiturates + metabolites, urine  Cutoff 200 ng/mL ?Benzodiazepine, urine              Cutoff 200 ng/mL ?Methadone, urine                   Cutoff 300 ng/mL ? ?The urine drug screen provides only a preliminary, unconfirmed ?analytical test result and should not be used for non-medical ?purposes. Clinical consideration and professional judgment should ?be applied to any positive drug screen result due to possible ?interfering substances. A more specific alternate chemical method ?must be used in order to obtain a confirmed analytical result. ?Gas chromatography / mass spectrometry (GC/MS) is the preferred ?confirm atory method. ?Performed at [facility], [address] [place], ?[place] [address] ?  ?Resp Panel by RT-PCR (Flu A&B, Covid) Nasopharyngeal Swab     Status: None  ? Collection Time: 04/01/22  1:24 AM  ? Specimen: Nasopharyngeal Swab;  Nasopharyngeal(NP) swabs in vial transport medium  ?Result Value Ref Range  ? SARS Coronavirus 2 by RT PCR NEGATIVE NEGATIVE  ?  Comment: (NOTE) ?SARS-CoV-2 target nucleic acids are NOT DETECTED. ? ?The SARS-CoV-2 RNA is gene

## 2022-04-01 NOTE — ED Notes (Signed)
Patient moved to room 23. Patient dressed out by this Clinical research associate and ED tech Vernona Rieger.   ? ?Pair black shoes ?Pair white socks ?Burgundy pants ?Black t-shirt ?Black smart phone  ?One ball cap ? ?Belongings locked in secure belongings room.  ?

## 2022-04-01 NOTE — ED Notes (Signed)
Patient is paranoid standing in doorway.  ?

## 2022-04-01 NOTE — ED Notes (Signed)
Breakfast placed at bedside. 

## 2022-04-01 NOTE — ED Notes (Signed)
Pt cell phone is in belonging bag at quad RN desk.  ?

## 2022-04-01 NOTE — Progress Notes (Addendum)
The patient was given oral medications for increased agitation. The psych team (NP) is unable to assess the patient currently. We will continue to attempt to get the patient assessment done once he can entirely participate in the assessment process. ?

## 2022-04-01 NOTE — ED Notes (Signed)
IVC/pending psych consult 

## 2022-04-01 NOTE — ED Notes (Signed)
IVC moved to BHU 3 

## 2022-04-01 NOTE — ED Notes (Signed)
Patient continues to be parinod and coming out of room even after being redirected. Security standing outside door.  ?

## 2022-04-04 ENCOUNTER — Emergency Department
Admission: EM | Admit: 2022-04-04 | Discharge: 2022-04-05 | Disposition: A | Discharge status: Home / Self Care | Payer: No Typology Code available for payment source | Attending: Student in an Organized Health Care Education/Training Program | Admitting: Student in an Organized Health Care Education/Training Program

## 2022-04-04 ENCOUNTER — Other Ambulatory Visit: Payer: Self-pay

## 2022-04-04 DIAGNOSIS — F1210 Cannabis abuse, uncomplicated: Secondary | ICD-10-CM | POA: Insufficient documentation

## 2022-04-04 DIAGNOSIS — F600 Paranoid personality disorder: Secondary | ICD-10-CM | POA: Insufficient documentation

## 2022-04-04 DIAGNOSIS — F23 Brief psychotic disorder: Secondary | ICD-10-CM

## 2022-04-04 DIAGNOSIS — F1919 Other psychoactive substance abuse with unspecified psychoactive substance-induced disorder: Secondary | ICD-10-CM | POA: Insufficient documentation

## 2022-04-04 DIAGNOSIS — T43652A Poisoning by methamphetamines intentional self-harm, initial encounter: Secondary | ICD-10-CM | POA: Diagnosis not present

## 2022-04-04 DIAGNOSIS — Z20822 Contact with and (suspected) exposure to covid-19: Secondary | ICD-10-CM | POA: Insufficient documentation

## 2022-04-04 DIAGNOSIS — F15951 Other stimulant use, unspecified with stimulant-induced psychotic disorder with hallucinations: Secondary | ICD-10-CM | POA: Diagnosis not present

## 2022-04-04 DIAGNOSIS — F1910 Other psychoactive substance abuse, uncomplicated: Secondary | ICD-10-CM

## 2022-04-04 MED ORDER — MIDAZOLAM HCL 2 MG/2ML IJ SOLN
2.0000 mg | Freq: Once | INTRAMUSCULAR | Status: AC
  Administered 2022-04-04: 2 mg via INTRAMUSCULAR
  Filled 2022-04-04: qty 2

## 2022-04-04 MED ORDER — HALOPERIDOL LACTATE 5 MG/ML IJ SOLN
5.0000 mg | Freq: Once | INTRAMUSCULAR | Status: AC
  Administered 2022-04-04: 5 mg via INTRAMUSCULAR
  Filled 2022-04-04: qty 1

## 2022-04-04 MED ORDER — DIPHENHYDRAMINE HCL 50 MG/ML IJ SOLN
25.0000 mg | Freq: Once | INTRAMUSCULAR | Status: AC
  Administered 2022-04-04: 25 mg via INTRAMUSCULAR
  Filled 2022-04-04: qty 1

## 2022-04-04 NOTE — ED Notes (Addendum)
Pt unable to answer questions, pt paranoid and pacing in room ?

## 2022-04-04 NOTE — ED Provider Notes (Signed)
? ?[facility] ?Provider Note ? ? ? Event Date/Time  ? First MD Initiated Contact with Patient 04/04/22 2037   ?  (approximate) ? ? ?History  ? ?Psychiatric Evaluation ? ?Level v caveat:  acute psychosis ? ?HPI ? ?Jeremy Frank is a 36 y.o. male history of polysubstance abuse presents to the ER acutely agitated paranoid with hallucinations and paranoia that people are out to kill him patient used marijuana as well as methamphetamines this evening.  Patient presents to the police under IVC.  Patient unable to provide much additional history patient very paranoid pacing about room ?  ? ? ?Physical Exam  ? ?Triage Vital Signs: ?ED Triage Vitals  ?Enc Vitals Group  ?   BP   ?   Pulse   ?   Resp   ?   Temp   ?   Temp src   ?   SpO2   ?   Weight   ?   Height   ?   Head Circumference   ?   Peak Flow   ?   Pain Score   ?   Pain Loc   ?   Pain Edu?   ?   Excl. in GC?   ? ? ?Most recent vital signs: ?There were no vitals filed for this visit. ? ? ?Constitutional: Alert  ?Eyes: Conjunctivae are normal.  ?Head: Atraumatic. ?Nose: No congestion/rhinnorhea. ?Mouth/Throat: Mucous membranes are moist.   ?Neck: Painless ROM.  ?Cardiovascular:   Good peripheral circulation. No m/g/r ?Respiratory: Normal respiratory effort.  No retractions.  ?Gastrointestinal: Soft and nontender.  ?Musculoskeletal:  no deformity ?Neurologic:  MAE spontaneously. No gross focal neurologic deficits are appreciated.  ?Skin:  Skin is warm, dry and intact. No rash noted. ?Psychiatric: anxious and acutely paranoid appearing ? ? ? ?ED Results / Procedures / Treatments  ? ?Labs ?(all labs ordered are listed, but only abnormal results are displayed) ?Labs Reviewed  ?RESP PANEL BY RT-PCR (FLU A&B, COVID) ARPGX2  ? ? ? ?EKG ? ? ? ? ?RADIOLOGY ? ? ? ?PROCEDURES: ? ?Critical Care performed: Yes, see critical care procedure note(s) ? ?.Critical Care ?Performed by: Willy Eddy, MD ?Authorized by: Willy Eddy, MD  ? ?Critical care  provider statement:  ?  Critical care time (minutes):  35 ?  Critical care was necessary to treat or prevent imminent or life-threatening deterioration of the following conditions:  CNS failure or compromise (psychosis) ?  Critical care was time spent personally by me on the following activities:  Ordering and performing treatments and interventions, ordering and review of laboratory studies, ordering and review of radiographic studies, pulse oximetry, re-evaluation of patient's condition, review of old charts, obtaining history from patient or surrogate, examination of patient, evaluation of patient's response to treatment, discussions with primary provider, discussions with consultants and development of treatment plan with patient or surrogate ? ? ?MEDICATIONS ORDERED IN ED: ?Medications  ?haloperidol lactate (HALDOL) injection 5 mg (has no administration in time range)  ?diphenhydrAMINE (BENADRYL) injection 25 mg (has no administration in time range)  ?midazolam (VERSED) injection 2 mg (has no administration in time range)  ? ? ? ?IMPRESSION / MDM / ASSESSMENT AND PLAN / ED COURSE  ?I reviewed the triage vital signs and the nursing notes. ?             ?               ? ?Differential diagnosis includes, but is not limited  to, Psychosis, delirium, medication effect, noncompliance, polysubstance abuse, Si, Hi, depression ? ?Patient presenting with acute agitation and paranoia psychosis likely secondary to polysubstance abuse.  Patient very paranoid pacing about the room cannot contract for safety.  Has had multiple presentations for similar symptoms in the past.  Given his acute agitation requiring please presents will provide IV M calming agent in the form of Benadryl Haldol and Versed.  Patient will be monitored.  We will continue IVC.  We will consult psychiatry but suspect this is most likely secondary to persistent substance abuse. ? ?The patient has been placed in psychiatric observation due to the need to  provide a safe environment for the patient while obtaining psychiatric consultation and evaluation, as well as ongoing medical and medication management to treat the patient's condition.  The patient has been placed under full IVC at this time. ? ? ?  ? ? ?FINAL CLINICAL IMPRESSION(S) / ED DIAGNOSES  ? ?Final diagnoses:  ?Polysubstance abuse (HCC)  ?Acute psychosis (HCC)  ? ? ? ?Rx / DC Orders  ? ?ED Discharge Orders   ? ? None  ? ?  ? ? ? ?Note:  This document was prepared using Dragon voice recognition software and may include unintentional dictation errors. ? ?  ?Willy Eddy, MD ?04/04/22 2051 ? ?

## 2022-04-04 NOTE — ED Notes (Signed)
IVC/pending psych consult 

## 2022-04-04 NOTE — BH Assessment (Signed)
Comprehensive Clinical Assessment (CCA) Note ? ?04/04/2022 ?Jeremy Frank ?[id number] ?Recommendations for Services/Supports/Treatments: Pt pending psych consult/disposition. ? ?Jeremy Frank is a 36 year old, English speaking, black male with a psych hx of MDD, psychosis, TBI, PTSD, and polysubstance abuse. Per triage note: Pt presents to ER via [facility] with complaints of paranoia. Pt in [employer] custody. Pt believes people are trying to kill him. On assessment, pt. was lethargic and disorganized. Pt was impaired with slurred speech. Pt admitted to using unknown amounts of marijuana, alcohol, and meth. Pt had difficulty concentrating. Pt denied depression, SI/HI/AV/H. Pt had poor eye contact. Pt's UDS/BAL in pending status at this time.  ? ?Chief Complaint:  ?Chief Complaint  ?Patient presents with  ? Psychiatric Evaluation  ? ?Visit Diagnosis: Polysubstance abuse  ? ? ?CCA Screening, Triage and Referral (STR) ? ?Patient Reported Information ?How did you hear about [place]? Self ? ?Referral name: No data recorded ?Referral phone number: No data recorded ? ?Whom do you see for routine medical problems? No data recorded ?Practice/Facility Name: No data recorded ?Practice/Facility Phone Number: No data recorded ?Name of Contact: No data recorded ?Contact Number: No data recorded ?Contact Fax Number: No data recorded ?Prescriber Name: No data recorded ?Prescriber Address (if known): No data recorded ? ?What Is the Reason for Your Visit/Call Today? Pt presents to ER via [facility] with complaints of paranoia. Pt in [employer] custody. Pt believes people are trying to kill him. ? ?How Long Has This Been Causing You Problems? 1 wk - 1 month ? ?What Do You Feel Would Help You the Most Today? Treatment for Depression or other mood problem; Medication(s) ? ? ?Have You Recently Been in Any Inpatient Treatment (Hospital/Detox/Crisis Center/28-Day Program)? No data recorded ?Name/Location of Program/Hospital:No data recorded ?How Long Were You  There? No data recorded ?When Were You Discharged? No data recorded ? ?Have You Ever Received Services From [facility] [facility] Before? No data recorded ?Who Do You See at [facility]? No data recorded ? ?Have You Recently Had Any Thoughts About Hurting Yourself? No ? ?Are You Planning to Commit Suicide/Harm Yourself At This time? No ? ? ?Have you Recently Had Thoughts About Hurting Someone Jeremy Frank? No ? ?Explanation: No data recorded ? ?Have You Used Any Alcohol or Drugs in the Past 24 Hours? Yes ? ?How Long Ago Did You Use Drugs or Alcohol? No data recorded ?What Did You Use and How Much? Meth; marijuana, alcohol; amounts unknown ? ? ?Do You Currently Have a Therapist/Psychiatrist? Yes ? ?Name of Therapist/Psychiatrist: [facility] ? ? ?Have You Been Recently Discharged From Any Office Practice or Programs? No ? ?Explanation of Discharge From Practice/Program: No data recorded ? ?  ?CCA Screening Triage Referral Assessment ?Type of Contact: Face-to-Face ? ?Is this Initial or Reassessment? No data recorded ?Date Telepsych consult ordered in [facility]:  No data recorded ?Time Telepsych consult ordered in [facility]:  No data recorded ? ?Patient Reported Information Reviewed? No data recorded ?Patient Left Without Being Seen? No data recorded ?Reason for Not Completing Assessment: No data recorded ? ?Collateral Involvement: None provided ? ? ?Does Patient Have a Automotive engineer Guardian? No data recorded ?Name and Contact of Legal Guardian: No data recorded ?If Minor and Not Living with Parent(s), Who has Custody? n/a ? ?Is CPS involved or ever been involved? Never ? ?Is APS involved or ever been involved? Never ? ? ?Patient Determined To Be At Risk for Harm To Self or Others Based on Review of Patient Reported Information or  Presenting Complaint? No ? ?Method: No data recorded ?Availability of Means: No data recorded ?Intent: No data recorded ?Notification Required: No data recorded ?Additional Information for Danger to Others  Potential: No data recorded ?Additional Comments for Danger to Others Potential: No data recorded ?Are There Guns or Other Weapons in Your Home? No data recorded ?Types of Guns/Weapons: No data recorded ?Are These Weapons Safely Secured?                            No data recorded ?Who Could Verify You Are Able To Have These Secured: No data recorded ?Do You Have any Outstanding Charges, Pending Court Dates, Parole/Probation? No data recorded ?Contacted To Inform of Risk of Harm To Self or Others: No data recorded ? ?Location of Assessment: [facility] ED ? ? ?Does Patient Present under Involuntary Commitment? No ? ?IVC Papers Initial File Date: 12/30/21 ? ? ?[place] of Residence: [place] ? ? ?Patient Currently Receiving the Following Services: Medication Management ? ? ?Determination of Need: Urgent (48 hours) ? ? ?Options For Referral: ED Visit; Therapeutic Triage Services ? ? ? ? ?CCA Biopsychosocial ?Intake/Chief Complaint:  No data recorded ?Current Symptoms/Problems: No data recorded ? ?Patient Reported Schizophrenia/Schizoaffective Diagnosis in Past: Yes ? ? ?Strengths: Pt has a support system; pt is able to ask for help ? ?Preferences: No data recorded ?Abilities: No data recorded ? ?Type of Services Patient Feels are Needed: No data recorded ? ?Initial Clinical Notes/Concerns: No data recorded ? ?Mental Health Symptoms ?Depression:   ?None ?  ?Duration of Depressive symptoms:  ?N/A ?  ?Mania:   ?N/A ?  ?Anxiety:    ?Restlessness; Worrying; Tension; Difficulty concentrating ?  ?Psychosis:   ?Grossly disorganized or catatonic behavior ?  ?Duration of Psychotic symptoms:  ?Greater than six months ?  ?Trauma:   ?Hypervigilance; Difficulty staying/falling asleep ?  ?Obsessions:   ?Intrusive/time consuming; Recurrent & persistent thoughts/impulses/images; Cause anxiety ?  ?Compulsions:   ?Intended to reduce stress or prevent another outcome; Poor Insight; Disrupts with routine/functioning; Absent insight/delusional ?   ?Inattention:   ?N/A ?  ?Hyperactivity/Impulsivity:   ?N/A ?  ?Oppositional/Defiant Behaviors:   ?None ?  ?Emotional Irregularity:   ?Potentially harmful impulsivity ?  ?Other Mood/Personality Symptoms:  No data recorded  ? ?Mental Status Exam ?Appearance and self-care  ?Stature:   ?Average ?  ?Weight:   ?Average weight ?  ?Clothing:   ?-- (In scrubs) ?  ?Grooming:   ?Normal ?  ?Cosmetic use:   ?None ?  ?Posture/gait:   ?Normal ?  ?Motor activity:   ?Agitated ?  ?Sensorium  ?Attention:   ?Distractible; Inattentive ?  ?Concentration:   ?Scattered ?  ?Orientation:   ?Person; Place; Object; Situation ?  ?Recall/memory:   ?Normal ?  ?Affect and Mood  ?Affect:   ?Blunted ?  ?Mood:   ?Anxious ?  ?Relating  ?Eye contact:   ?Fleeting ?  ?Facial expression:   ?Anxious ?  ?Attitude toward examiner:   ?Suspicious ?  ?Thought and Language  ?Speech flow:  ?Slurred ?  ?Thought content:   ?Appropriate to Mood and Circumstances ?  ?Preoccupation:   ?None ?  ?Hallucinations:   ?None ?  ?Organization:  No data recorded  ?Executive Functions  ?Fund of Knowledge:   ?Average ?  ?Intelligence:   ?Average ?  ?Abstraction:   ?Functional ?  ?Judgement:   ?Impaired ?  ?Reality Testing:   ?Distorted ?  ?Insight:   ?Present; Shallow ?  ?  Decision Making:   ?Impulsive ?  ?Social Functioning  ?Social Maturity:   ?Irresponsible; Impulsive ?  ?Social Judgement:   ?"Chief of Staff"; Heedless ?  ?Stress  ?Stressors:   ?Other (Comment) (polysubstance abuse) ?  ?Coping Ability:   ?Overwhelmed ?  ?Skill Deficits:   ?Decision making ?  ?Supports:   ?Support needed; Friends/Service system ?  ? ? ?Religion: ?Religion/Spirituality ?Are You A Religious Person?: No ?How Might This Affect Treatment?: n/a ? ?Leisure/Recreation: ?Leisure / Recreation ?Do You Have Hobbies?: No ? ?Exercise/Diet: ?Exercise/Diet ?Do You Exercise?: No ?Have You Gained or Lost A Significant Amount of Weight in the Past Six Months?: No ?Do You Follow a Special Diet?: No ?Do You Have Any  Trouble Sleeping?: Yes ?Explanation of Sleeping Difficulties: Trouble falling and staying asleep ? ? ?CCA Employment/Education ?Employment/Work Situation: ?Employment / Work Situation ?Employment Situation: On [employer]

## 2022-04-04 NOTE — ED Notes (Addendum)
Pt dressed out in blue paper scrubs. Pt clothes placed in belongings bag. [employer] also have this RN pts bookbag that was in police car. ? ?Khaki pants ?Pair of black nike shoes ?Pair of gray socks ?1 black belt ?Green shirt ?1 gold necklace ? ?

## 2022-04-04 NOTE — ED Triage Notes (Addendum)
Pt presents to ER via [facility] with complaints of paranoia. Pt in [employer] custody. Pt believes people are trying to kill him. Pt defensive. Per EMS, pt smoked marijuana. Hx of schizophrenia ?

## 2022-04-05 DIAGNOSIS — F15951 Other stimulant use, unspecified with stimulant-induced psychotic disorder with hallucinations: Secondary | ICD-10-CM | POA: Diagnosis not present

## 2022-04-05 LAB — RESP PANEL BY RT-PCR (FLU A&B, COVID) ARPGX2
Influenza A by PCR: NEGATIVE
Influenza B by PCR: NEGATIVE
SARS Coronavirus 2 by RT PCR: NEGATIVE

## 2022-04-05 NOTE — Discharge Instructions (Signed)
These follow-up with [facility].  Return as needed take your medicine ?

## 2022-04-05 NOTE — Consult Note (Signed)
[facility] Face-to-Face Psychiatry Consult  ? ?Reason for Consult:  Hallucinations, paranoia related to stimulant abuse ?Referring Physician:  EDP ?Patient Identification: Jeremy Frank ?MRN:  [id number] ?Principal Diagnosis: Amphetamine and psychostimulant-induced psychosis with hallucinations (HCC) ?Diagnosis:  Principal Problem: ?  Amphetamine and psychostimulant-induced psychosis with hallucinations (HCC) ? ? ?Total Time spent with patient: 45 minutes ? ?Subjective:   ?Jeremy Frank is a 36 y.o. male patient admitted with paranoia and hallucinations related to cocaine and meth abuse. ? ?HPI:  36 yo male presented to the ED by police after experiencing paranoia and hallucinations after using meth and cocaine.  Today, he is clear and coherent with no psychosis or paranoia.  Denies suicidal/homicidal ideations and withdrawal symptoms.  Recommend rehab, patient declined.  He does receive care at the [facility], psychiatrically stable for discharge. ? ?Past Psychiatric History: stimulant use d/o, PTSD ? ?Risk to Self:  none ?Risk to Others:  none ?Prior Inpatient Therapy:  yes ?[facility] ? ?Past Medical History:  ?Past Medical History:  ?Diagnosis Date  ? Drug dependence (HCC)   ? GI bleed   ? Hiatal hernia   ? Hypertension   ? Migraine   ? PTSD (post-traumatic stress disorder)   ?  ?Past Surgical History:  ?Procedure Laterality Date  ? KNEE SURGERY    ? ?Family History:  ?Family History  ?Problem Relation Age of Onset  ? Heart failure Mother   ? Hypertension Father   ? ?Family Psychiatric  History: see above ?Social History:  ?Social History  ? ?Substance and Sexual Activity  ?Alcohol Use Yes  ? Alcohol/week: 42.0 standard drinks  ? Types: 42 Cans of beer per week  ? Comment: per pt he drinks 6-12 cans of beer daily  ?   ?Social History  ? ?Substance and Sexual Activity  ?Drug Use Yes  ? Types: Cocaine, Marijuana, Amphetamines  ?  ?Social History  ? ?Socioeconomic History  ? Marital status: Single  ?   Spouse name: Not on file  ? Number of children: Not on file  ? Years of education: Not on file  ? Highest education level: Not on file  ?Occupational History  ? Not on file  ?Tobacco Use  ? Smoking status: Every Day  ?  Packs/day: 0.25  ?  Types: Cigarettes  ? Smokeless tobacco: Never  ?Vaping Use  ? Vaping Use: Never used  ?Substance and Sexual Activity  ? Alcohol use: Yes  ?  Alcohol/week: 42.0 standard drinks  ?  Types: 42 Cans of beer per week  ?  Comment: per pt he drinks 6-12 cans of beer daily  ? Drug use: Yes  ?  Types: Cocaine, Marijuana, Amphetamines  ? Sexual activity: Not Currently  ?Other Topics Concern  ? Not on file  ?Social History Narrative  ? Not on file  ? ?Social Determinants of Health  ? ?Financial Resource Strain: Not on file  ?Food Insecurity: Not on file  ?Transportation Needs: Not on file  ?Physical Activity: Not on file  ?Stress: Not on file  ?Social Connections: Not on file  ? ?Additional Social History: ?  ? ?Allergies:   ?Allergies  ?Allergen Reactions  ? Peanut-Containing Drug Products Diarrhea, Anaphylaxis and Swelling  ? Cheese Diarrhea  ? Eggs Or Egg-Derived Products Diarrhea  ? ? ?Labs:  ?Results for orders placed or performed during the hospital encounter of 04/04/22 (from the past 48 hour(s))  ?Resp Panel by RT-PCR (Flu A&B, Covid) Nasopharyngeal Swab  Status: None  ? Collection Time: 04/04/22 10:50 PM  ? Specimen: Nasopharyngeal Swab; Nasopharyngeal(NP) swabs in vial transport medium  ?Result Value Ref Range  ? SARS Coronavirus 2 by RT PCR NEGATIVE NEGATIVE  ?  Comment: (NOTE) ?SARS-CoV-2 target nucleic acids are NOT DETECTED. ? ?The SARS-CoV-2 RNA is generally detectable in upper respiratory ?specimens during the acute phase of infection. The lowest ?concentration of SARS-CoV-2 viral copies this assay can detect is ?138 copies/mL. A negative result does not preclude SARS-Cov-2 ?infection and should not be used as the sole basis for treatment or ?other patient management  decisions. A negative result may occur with  ?improper specimen collection/handling, submission of specimen other ?than nasopharyngeal swab, presence of viral mutation(s) within the ?areas targeted by this assay, and inadequate number of viral ?copies(<138 copies/mL). A negative result must be combined with ?clinical observations, patient history, and epidemiological ?information. The expected result is Negative. ? ?Fact Sheet for Patients:  ?BloggerCourse.com ? ?Fact Sheet for Healthcare Providers:  ?SeriousBroker.it ? ?This test is no t yet approved or cleared by the [place] FDA and  ?has been authorized for detection and/or diagnosis of SARS-CoV-2 by ?FDA under an Emergency Use Authorization (EUA). This EUA will remain  ?in effect (meaning this test can be used) for the duration of the ?COVID-19 declaration under Section 564(b)(1) of the Act, 21 ?U.S.C.section 360bbb-3(b)(1), unless the authorization is terminated  ?or revoked sooner.  ? ? ?  ? Influenza A by PCR NEGATIVE NEGATIVE  ? Influenza B by PCR NEGATIVE NEGATIVE  ?  Comment: (NOTE) ?The Xpert Xpress SARS-CoV-2/FLU/RSV plus assay is intended as an aid ?in the diagnosis of influenza from Nasopharyngeal swab specimens and ?should not be used as a sole basis for treatment. Nasal washings and ?aspirates are unacceptable for Xpert Xpress SARS-CoV-2/FLU/RSV ?testing. ? ?Fact Sheet for Patients: ?BloggerCourse.com ? ?Fact Sheet for Healthcare Providers: ?SeriousBroker.it ? ?This test is not yet approved or cleared by the [place] FDA and ?has been authorized for detection and/or diagnosis of SARS-CoV-2 by ?FDA under an Emergency Use Authorization (EUA). This EUA will remain ?in effect (meaning this test can be used) for the duration of the ?COVID-19 declaration under Section 564(b)(1) of the Act, 21 U.S.C. ?section 360bbb-3(b)(1), unless the authorization  is terminated or ?revoked. ? ?Performed at [facility], [address] [place], ?[place] [address] ?  ? ? ?No current facility-administered medications for this encounter.  ? ?Current Outpatient Medications  ?Medication Sig Dispense Refill  ? haloperidol (HALDOL) 2 MG tablet Take 2 mg by mouth 2 (two) times daily.    ? hydrOXYzine (ATARAX) 50 MG tablet Take 1 tablet (50 mg total) by mouth every 4 (four) hours as needed for anxiety. 30 tablet 1  ? OLANZapine (ZYPREXA) 5 MG tablet Take 1 tablet (5 mg total) by mouth at bedtime. 30 tablet WARM  ? pantoprazole (PROTONIX) 40 MG tablet Take 2 tablets (80 mg total) by mouth daily. 60 tablet 1  ? divalproex (DEPAKOTE) 500 MG DR tablet Take 1 tablet (500 mg total) by mouth every 12 (twelve) hours. (Patient not taking: Reported on 04/01/2022) 60 tablet 1  ? gabapentin (NEURONTIN) 300 MG capsule Take 1 capsule (300 mg total) by mouth 3 (three) times daily. (Patient not taking: Reported on 04/01/2022) 90 capsule 1  ? traZODone (DESYREL) 100 MG tablet Take 1 tablet (100 mg total) by mouth at bedtime as needed for sleep. (Patient not taking: Reported on 04/01/2022) 30 tablet 1  ? ? ?  Musculoskeletal: ?Strength & Muscle Tone: within normal limits ?Gait & Station: normal ?Patient leans: N/A ? ?Psychiatric Specialty Exam: ?Physical Exam ?Vitals and nursing note reviewed.  ?Constitutional:   ?   Appearance: Normal appearance.  ?HENT:  ?   Head: Normocephalic.  ?   Nose: Nose normal.  ?Pulmonary:  ?   Effort: Pulmonary effort is normal.  ?Musculoskeletal:     ?   General: Normal range of motion.  ?   Cervical back: Normal range of motion.  ?Neurological:  ?   General: No focal deficit present.  ?   Mental Status: He is alert and oriented to person, place, and time.  ?Psychiatric:     ?   Attention and Perception: Attention and perception normal.     ?   Mood and Affect: Mood normal. Affect is blunt.     ?   Speech: Speech normal.     ?   Behavior: Behavior normal. Behavior is  cooperative.     ?   Thought Content: Thought content normal.     ?   Cognition and Memory: Cognition and memory normal.     ?   Judgment: Judgment normal.  ?  ?Review of Systems  ?Constitutional:  Positive for m

## 2022-04-05 NOTE — BH Assessment (Signed)
Writer spoke with the patient to complete an updated/reassessment. Patient denies SI/HI and AV/H. 

## 2022-08-19 ENCOUNTER — Emergency Department
Admission: EM | Admit: 2022-08-19 | Discharge: 2022-08-19 | Disposition: A | Discharge status: Home / Self Care | Payer: No Typology Code available for payment source | Attending: Emergency Medicine | Admitting: Emergency Medicine

## 2022-08-19 ENCOUNTER — Other Ambulatory Visit: Payer: Self-pay

## 2022-08-19 DIAGNOSIS — F17210 Nicotine dependence, cigarettes, uncomplicated: Secondary | ICD-10-CM | POA: Insufficient documentation

## 2022-08-19 DIAGNOSIS — I10 Essential (primary) hypertension: Secondary | ICD-10-CM | POA: Diagnosis present

## 2022-08-19 DIAGNOSIS — F1990 Other psychoactive substance use, unspecified, uncomplicated: Secondary | ICD-10-CM

## 2022-08-19 DIAGNOSIS — S069XAA Unspecified intracranial injury with loss of consciousness status unknown, initial encounter: Secondary | ICD-10-CM | POA: Diagnosis present

## 2022-08-19 DIAGNOSIS — S0990XA Unspecified injury of head, initial encounter: Secondary | ICD-10-CM | POA: Diagnosis present

## 2022-08-19 DIAGNOSIS — F19188 Other psychoactive substance abuse with other psychoactive substance-induced disorder: Secondary | ICD-10-CM | POA: Diagnosis not present

## 2022-08-19 DIAGNOSIS — F1420 Cocaine dependence, uncomplicated: Secondary | ICD-10-CM | POA: Diagnosis not present

## 2022-08-19 DIAGNOSIS — F1024 Alcohol dependence with alcohol-induced mood disorder: Secondary | ICD-10-CM | POA: Insufficient documentation

## 2022-08-19 DIAGNOSIS — X58XXXA Exposure to other specified factors, initial encounter: Secondary | ICD-10-CM | POA: Insufficient documentation

## 2022-08-19 DIAGNOSIS — F4310 Post-traumatic stress disorder, unspecified: Secondary | ICD-10-CM | POA: Diagnosis not present

## 2022-08-19 DIAGNOSIS — R000 Tachycardia, unspecified: Secondary | ICD-10-CM | POA: Insufficient documentation

## 2022-08-19 DIAGNOSIS — Y900 Blood alcohol level of less than 20 mg/100 ml: Secondary | ICD-10-CM | POA: Diagnosis not present

## 2022-08-19 DIAGNOSIS — F15151 Other stimulant abuse with stimulant-induced psychotic disorder with hallucinations: Secondary | ICD-10-CM | POA: Diagnosis not present

## 2022-08-19 DIAGNOSIS — F29 Unspecified psychosis not due to a substance or known physiological condition: Secondary | ICD-10-CM | POA: Diagnosis present

## 2022-08-19 DIAGNOSIS — F32A Depression, unspecified: Secondary | ICD-10-CM | POA: Diagnosis present

## 2022-08-19 DIAGNOSIS — F17200 Nicotine dependence, unspecified, uncomplicated: Secondary | ICD-10-CM | POA: Insufficient documentation

## 2022-08-19 DIAGNOSIS — F1220 Cannabis dependence, uncomplicated: Secondary | ICD-10-CM | POA: Diagnosis not present

## 2022-08-19 DIAGNOSIS — F1014 Alcohol abuse with alcohol-induced mood disorder: Secondary | ICD-10-CM | POA: Diagnosis present

## 2022-08-19 DIAGNOSIS — N1831 Chronic kidney disease, stage 3a: Secondary | ICD-10-CM | POA: Diagnosis present

## 2022-08-19 DIAGNOSIS — I129 Hypertensive chronic kidney disease with stage 1 through stage 4 chronic kidney disease, or unspecified chronic kidney disease: Secondary | ICD-10-CM | POA: Insufficient documentation

## 2022-08-19 DIAGNOSIS — K5090 Crohn's disease, unspecified, without complications: Secondary | ICD-10-CM | POA: Diagnosis present

## 2022-08-19 DIAGNOSIS — F1020 Alcohol dependence, uncomplicated: Secondary | ICD-10-CM | POA: Diagnosis present

## 2022-08-19 DIAGNOSIS — Z20822 Contact with and (suspected) exposure to covid-19: Secondary | ICD-10-CM | POA: Insufficient documentation

## 2022-08-19 DIAGNOSIS — S069X0A Unspecified intracranial injury without loss of consciousness, initial encounter: Secondary | ICD-10-CM | POA: Insufficient documentation

## 2022-08-19 DIAGNOSIS — F19950 Other psychoactive substance use, unspecified with psychoactive substance-induced psychotic disorder with delusions: Secondary | ICD-10-CM | POA: Diagnosis present

## 2022-08-19 DIAGNOSIS — F15951 Other stimulant use, unspecified with stimulant-induced psychotic disorder with hallucinations: Secondary | ICD-10-CM | POA: Diagnosis present

## 2022-08-19 DIAGNOSIS — F1510 Other stimulant abuse, uncomplicated: Secondary | ICD-10-CM | POA: Diagnosis present

## 2022-08-19 DIAGNOSIS — F1010 Alcohol abuse, uncomplicated: Secondary | ICD-10-CM | POA: Diagnosis present

## 2022-08-19 LAB — COMPREHENSIVE METABOLIC PANEL
ALT: 21 U/L (ref 0–44)
AST: 43 U/L — ABNORMAL HIGH (ref 15–41)
Albumin: 4.8 g/dL (ref 3.5–5.0)
Alkaline Phosphatase: 61 U/L (ref 38–126)
Anion gap: 10 (ref 5–15)
BUN: 19 mg/dL (ref 6–20)
CO2: 21 mmol/L — ABNORMAL LOW (ref 22–32)
Calcium: 9.6 mg/dL (ref 8.9–10.3)
Chloride: 105 mmol/L (ref 98–111)
Creatinine, Ser: 1.47 mg/dL — ABNORMAL HIGH (ref 0.61–1.24)
GFR, Estimated: >60 mL/min (ref >60)
Glucose, Bld: 162 mg/dL — ABNORMAL HIGH (ref 70–99)
Potassium: 3.1 mmol/L — ABNORMAL LOW (ref 3.5–5.1)
Sodium: 136 mmol/L (ref 135–145)
Total Bilirubin: 0.6 mg/dL (ref 0.3–1.2)
Total Protein: 8.8 g/dL — ABNORMAL HIGH (ref 6.5–8.1)

## 2022-08-19 LAB — CBC
HCT: 42.5 % (ref 39.0–52.0)
Hemoglobin: 14.1 g/dL (ref 13.0–17.0)
MCH: 29.2 pg (ref 26.0–34.0)
MCHC: 33.2 g/dL (ref 30.0–36.0)
MCV: 88 fL (ref 80.0–100.0)
Platelets: 243 10*3/uL (ref 150–400)
RBC: 4.83 MIL/uL (ref 4.22–5.81)
RDW: 13.9 % (ref 11.5–15.5)
WBC: 11.7 10*3/uL — ABNORMAL HIGH (ref 4.0–10.5)
nRBC: 0 % (ref 0.0–0.2)

## 2022-08-19 LAB — TROPONIN I (HIGH SENSITIVITY): Troponin I (High Sensitivity): 7 ng/L (ref <18)

## 2022-08-19 LAB — URINE DRUG SCREEN, QUALITATIVE (ARMC ONLY)
Amphetamines, Ur Screen: POSITIVE — AB
Barbiturates, Ur Screen: NOT DETECTED
Benzodiazepine, Ur Scrn: NOT DETECTED
Cannabinoid 50 Ng, Ur ~~LOC~~: POSITIVE — AB
Cocaine Metabolite,Ur ~~LOC~~: POSITIVE — AB
MDMA (Ecstasy)Ur Screen: NOT DETECTED
Methadone Scn, Ur: NOT DETECTED
Opiate, Ur Screen: NOT DETECTED
Phencyclidine (PCP) Ur S: NOT DETECTED
Tricyclic, Ur Screen: NOT DETECTED

## 2022-08-19 LAB — ETHANOL: Alcohol, Ethyl (B): <10 mg/dL (ref <10)

## 2022-08-19 LAB — RESP PANEL BY RT-PCR (FLU A&B, COVID) ARPGX2
Influenza A by PCR: NEGATIVE
Influenza B by PCR: NEGATIVE
SARS Coronavirus 2 by RT PCR: NEGATIVE

## 2022-08-19 LAB — SALICYLATE LEVEL: Salicylate Lvl: <7 mg/dL — ABNORMAL LOW (ref 7.0–30.0)

## 2022-08-19 LAB — ACETAMINOPHEN LEVEL: Acetaminophen (Tylenol), Serum: <10 ug/mL — ABNORMAL LOW (ref 10–30)

## 2022-08-19 MED ORDER — THIAMINE HCL 100 MG/ML IJ SOLN: 100.0000 mg | Freq: Every day | INTRAMUSCULAR | Status: DC

## 2022-08-19 MED ORDER — LORAZEPAM 2 MG/ML IJ SOLN: 0.0000 mg | Freq: Two times a day (BID) | INTRAMUSCULAR | Status: DC

## 2022-08-19 MED ORDER — POTASSIUM CHLORIDE CRYS ER 20 MEQ PO TBCR
40.0000 meq | EXTENDED_RELEASE_TABLET | Freq: Once | ORAL | Status: AC
  Administered 2022-08-19: 20 meq via ORAL
  Filled 2022-08-19: qty 2

## 2022-08-19 MED ORDER — LORAZEPAM 2 MG PO TABS: 0.0000 mg | ORAL_TABLET | Freq: Two times a day (BID) | ORAL | Status: DC

## 2022-08-19 MED ORDER — LORAZEPAM 2 MG/ML IJ SOLN: 0.0000 mg | Freq: Four times a day (QID) | INTRAMUSCULAR | Status: DC

## 2022-08-19 MED ORDER — LORAZEPAM 2 MG PO TABS
0.0000 mg | ORAL_TABLET | Freq: Four times a day (QID) | ORAL | Status: DC
  Administered 2022-08-19: 2 mg via ORAL
  Filled 2022-08-19: qty 1

## 2022-08-19 MED ORDER — THIAMINE HCL 100 MG PO TABS: 100.0000 mg | ORAL_TABLET | Freq: Every day | ORAL | Status: DC

## 2022-08-19 MED ORDER — TRAZODONE HCL 100 MG PO TABS
100.0000 mg | ORAL_TABLET | Freq: Once | ORAL | Status: AC
  Administered 2022-08-19: 100 mg via ORAL
  Filled 2022-08-19: qty 1
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
## 2022-08-19 NOTE — ED Notes (Signed)
Pt reports improvements in anxiety and headache after med admin, provided with food

## 2022-08-19 NOTE — ED Notes (Signed)
Dr. Sidney Ace notified of pt repeat vitals, no new orders at this time

## 2022-08-19 NOTE — ED Notes (Signed)
Pt has been very concerned with all things over last several hours. Pt is unable to stop talking with staff and feels that staff need to be by him so he can express his thoughts and have staff discover his problems. Pt has been provided with many different reasons for his thoughts that have all been dismissed.

## 2022-08-19 NOTE — ED Notes (Signed)
VOL/Psych Consult Ordered/Pending

## 2022-08-19 NOTE — ED Notes (Signed)
Verified correct patient and correct discharge papers given. Pt alert and oriented X 4, stable for discharge. RR even and unlabored, color WNL. Discussed discharge instructions and follow-up as directed. Discharge medications discussed, when prescribed. Pt had opportunity to ask questions, and RN available to provide patient and/or family education. Left with all of belongings. Given outpatient resources including [facility] and homeless shelter. Pt taking bus home.

## 2022-08-19 NOTE — ED Notes (Signed)
[employer] reports pt is cooperative at this time in route. Reports that pt is speaking to himself and hallucinating in the back of the cop car.

## 2022-08-19 NOTE — ED Notes (Signed)
Pt has been asked and educated on need for urine sample several times by this nurse and other staff. Will attempt to collect when pt goes to restroom

## 2022-08-19 NOTE — ED Notes (Signed)
Pt reports homelessness, appears to agree with anything that is asked and is seeking to manipulate visit to stay in ED. Pt is vague in conversation about symptoms. No new changes are reported other than lack of sleep. Requests Social work, the [employer], psych inpatient, overnight observation, and living arrangements be found. Pt does inform this nurse that symptoms are not the same as prior visits and he is not in a state psychosis because he knows what is going on. Pt calm and cooperative but will not remove his shirt at this time or provide urine sample. Does admit to stimulant use "anything I can get my hands on." Daily drinker 6-12 beers a day

## 2022-08-19 NOTE — ED Provider Notes (Addendum)
[facility] Provider Note    Event Date/Time   First MD Initiated Contact with Patient 08/19/22 0123     (approximate)   History   No chief complaint on file.   HPI  Jeremy Frank is a 36 y.o. male  with pmh polysubstance use disorder, PTSD, HTN who presents voluntarily due to worsening of his PTSD.  Patient tells me he is homeless has been for several months.  Tonight just got too overwhelming felt like his hallucinations were worse.  Says that everything looks "militant" typically he just deals with these hallucinations but it became more overwhelming tonight.  Does admit to using drugs and alcohol.  Says he uses what ever he can get his hands on including prescription amphetamines meth cannabinoids and alcohol drinks about sixpack of beer per day last drink 3 hours ago.  Does have history of withdrawal seizures.  Would like to speak with a Child psychotherapist.  Denies SI or HI.Marland Kitchen       Past Medical History:  Diagnosis Date   Drug dependence (HCC)    GI bleed    Hiatal hernia    Hypertension    Migraine    PTSD (post-traumatic stress disorder)     Patient Active Problem List   Diagnosis Date Noted   Chest pain 02/10/2022   Polysubstance abuse (HCC) 02/10/2022   Hallucination 02/10/2022   Depression 02/10/2022   Alcohol abuse 02/10/2022   Tobacco abuse 02/10/2022   HTN (hypertension) 02/10/2022   Lung nodules 02/10/2022   Elevated troponin 02/10/2022   GERD (gastroesophageal reflux disease) 02/10/2022   Chronic kidney disease, stage 3a (HCC) 02/10/2022   Migraine 02/10/2022   Cannabis dependence, continuous (HCC) 06/18/2021   Alcohol abuse with alcohol-induced mood disorder (HCC) 06/18/2021   Crohn's disease (HCC) 06/18/2021   Nicotine dependence 06/18/2021   Peptic ulcer 06/18/2021   Severe alcohol dependence (HCC) 06/18/2021   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HCC)    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   Amphetamine and  psychostimulant-induced psychosis with hallucinations (HCC) 10/19/2020   Hypertension 10/19/2020   Psychosis (HCC) 06/13/2015   Posttraumatic stress disorder 06/13/2015   Traumatic brain injury (HCC) 06/13/2015   Cocaine dependence (HCC) 06/13/2015   Marijuana abuse 06/13/2015     Physical Exam  Triage Vital Signs: ED Triage Vitals  Enc Vitals Group     BP 08/19/22 0103 (!) 204/129     Pulse Rate 08/19/22 0103 (!) 110     Resp 08/19/22 0103 17     Temp 08/19/22 0103 99.2 F (37.3 C)     Temp Source 08/19/22 0103 Oral     SpO2 08/19/22 0103 94 %     Weight 08/19/22 0103 220 lb (99.8 kg)     Height 08/19/22 0103 5\' 11"  (1.803 m)     Head Circumference --      Peak Flow --      Pain Score 08/19/22 0102 0     Pain Loc --      Pain Edu? --      Excl. in GC? --     Most recent vital signs: Vitals:   08/19/22 0103  BP: (!) 204/129  Pulse: (!) 110  Resp: 17  Temp: 99.2 F (37.3 C)  SpO2: 94%     General: Awake, no distress.  CV:  Good peripheral perfusion.  Resp:  Normal effort.  Abd:  No distention.  Neuro:  Awake, Alert, Oriented x 3  Other:  Patient is calm and cooperative does not appear to be reacting to internal stimuli  ED Results / Procedures / Treatments  Labs (all labs ordered are listed, but only abnormal results are displayed) Labs Reviewed  CBC - Abnormal; Notable for the following components:      Result Value   WBC 11.7 (*)    All other components within normal limits  RESP PANEL BY RT-PCR (FLU A&B, COVID) [id number]  COMPREHENSIVE METABOLIC PANEL  ETHANOL  SALICYLATE LEVEL  ACETAMINOPHEN LEVEL  URINE DRUG SCREEN, QUALITATIVE ([facility] ONLY)  TROPONIN I (HIGH SENSITIVITY)     EKG     RADIOLOGY    PROCEDURES:  Critical Care performed: No  Procedures     MEDICATIONS ORDERED IN ED: Medications  LORazepam (ATIVAN) injection 0-4 mg (has no administration in time range)    Or  LORazepam (ATIVAN) tablet 0-4 mg (has no  administration in time range)  LORazepam (ATIVAN) injection 0-4 mg (has no administration in time range)    Or  LORazepam (ATIVAN) tablet 0-4 mg (has no administration in time range)  thiamine (VITAMIN B1) tablet 100 mg (has no administration in time range)    Or  thiamine (VITAMIN B1) injection 100 mg (has no administration in time range)     IMPRESSION / MDM / ASSESSMENT AND PLAN / ED COURSE  I reviewed the triage vital signs and the nursing notes.                              Patient's presentation is most consistent with exacerbation of chronic illness.  Differential diagnosis includes, but is not limited to, exacerbation of PTSD, psychosis, drug-induced mood disorder, malingering  The patient is 36 year old male with underlying PTSD presents today because of worsening of his hallucinations feeling overwhelmed the setting of being homeless.  Has not had a place to go in several months.  He does admit to using drugs and alcohol about 6 pack of beer per day with last drink 3 hours ago does have history of withdrawal per the patient.  He is hypertensive and tachycardic on arrival.  He appears calm and cooperative is not reacting to internal stimuli.  He wants to talk to Education officer, museum.  My suspicion is that this is primarily a social issue.  We will have psychiatry see him.  No indication for IVC as he is not suicidal do not feel that he is a danger to self or others at this time.  Pt has low grade temp of 100.7 on repeat VS along with tachycardia and HTN. Pt without infectious symptoms. May be environmental vs. In the s/o amphetamine use. Pt given PO ativan per CIWA. Will continue to monitor.   The patient has been placed in psychiatric observation due to the need to provide a safe environment for the patient while obtaining psychiatric consultation and evaluation, as well as ongoing medical and medication management to treat the patient's condition.  The patient has not been placed under  full IVC at this time.     Pt was seen by psychiatry who is ok with dc with outpt resources in the AM. Pts temp has normalized.    FINAL CLINICAL IMPRESSION(S) / ED DIAGNOSES   Final diagnoses:  PTSD (post-traumatic stress disorder)     Rx / DC Orders   ED Discharge Orders     None  Note:  This document was prepared using Dragon voice recognition software and may include unintentional dictation errors.   Georga Hacking, MD 08/19/22 0142    Georga Hacking, MD 08/19/22 [phone]

## 2022-08-19 NOTE — Consult Note (Signed)
[facility] Face-to-Face Psychiatry Consult   Reason for Consult: No chief complaint on file. Referring Physician: Dr. Starleen Blue Patient Identification: Jeremy Frank MRN:  [id number] Principal Diagnosis: <principal problem not specified> Diagnosis:  Active Problems:   Psychosis ([place])   Posttraumatic stress disorder   Traumatic brain injury ([place])   Cocaine dependence ([place])   Amphetamine abuse ([place])   Amphetamine and psychostimulant-induced psychosis with hallucinations ([place])   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[place])   Cannabis dependence, continuous ([place])   Alcohol abuse with alcohol-induced mood disorder ([place])   Crohn's disease ([place])   Severe alcohol dependence ([place])   Depression   Alcohol abuse   HTN (hypertension)   Chronic kidney disease, stage 3a ([place])   Total Time spent with patient: 1 hour  Subjective: "I need to go to detox facility to get some help."  Jeremy Frank is a 36 y.o. male patient presented to [facility] ED via law enforcement voluntarily. Per triage nurses note, Pt ambulatory to triage, escorted in by [employer]. Pt states he is having anxiety attack, onset apx 1 hr ago.  Pt reports friend was attempting to force him out onto the streets which led to [employer] being called. Also reports not sleeping x 24 hours.  Denies SI/HI. Is having A/V hallucinations.Endorses using stimulants such as caffeine and coke, and ETOH tonight.  This provider saw The patient face-to-face; the chart was reviewed, and consulted with Dr. Starleen Blue on 08/19/2022 due to the patient's care. It was discussed with the EDP that the patient does not meet the criteria to be admitted to the psychiatric inpatient unit.  On evaluation, the patient is alert and oriented x4, calm, cooperative, and mood-congruent with affect. The patient does appear to be responding to internal stimuli. The patient is presenting with some delusional thinking. The patient denies auditory or visual hallucinations. The patient denies  any suicidal, homicidal, or self-harm ideations. The patient is not presenting with any psychotic or paranoid behaviors. During an encounter with the patient, he could answer questions appropriately.  HPI: Per Dr. Starleen Blue, Jeremy Frank is a 36 y.o. male  with pmh polysubstance use disorder, PTSD, HTN who presents voluntarily due to worsening of his PTSD.  Patient tells me he is homeless has been for several months.  Tonight just got too overwhelming felt like his hallucinations were worse.  Says that everything looks "militant" typically he just deals with these hallucinations but it became more overwhelming tonight.  Does admit to using drugs and alcohol.  Says he uses what ever he can get his hands on including prescription amphetamines meth cannabinoids and alcohol drinks about sixpack of beer per day last drink 3 hours ago.  Does have history of withdrawal seizures.  Would like to speak with a Education officer, museum.  Denies SI or HI.  Past Psychiatric History:  Drug dependence ([place]) Migraine PTSD (post-traumatic stress disorder)   Risk to Self:   Risk to Others:   Prior Inpatient Therapy:   Prior Outpatient Therapy:    Past Medical History:  Past Medical History:  Diagnosis Date   Drug dependence ([place])    GI bleed    Hiatal hernia    Hypertension    Migraine    PTSD (post-traumatic stress disorder)     Past Surgical History:  Procedure Laterality Date   KNEE SURGERY     Family History:  Family History  Problem Relation Age of Onset   Heart failure Mother    Hypertension Father  Family Psychiatric  History:  Social History:  Social History   Substance and Sexual Activity  Alcohol Use Yes   Alcohol/week: 42.0 standard drinks of alcohol   Types: 42 Cans of beer per week   Comment: per pt he drinks 6-12 cans of beer daily     Social History   Substance and Sexual Activity  Drug Use Yes   Types: Cocaine, Marijuana, Amphetamines    Social History   Socioeconomic  History   Marital status: Single    Spouse name: Not on file   Number of children: Not on file   Years of education: Not on file   Highest education level: Not on file  Occupational History   Not on file  Tobacco Use   Smoking status: Every Day    Packs/day: 0.25    Types: Cigarettes   Smokeless tobacco: Never  Vaping Use   Vaping Use: Never used  Substance and Sexual Activity   Alcohol use: Yes    Alcohol/week: 42.0 standard drinks of alcohol    Types: 42 Cans of beer per week    Comment: per pt he drinks 6-12 cans of beer daily   Drug use: Yes    Types: Cocaine, Marijuana, Amphetamines   Sexual activity: Not Currently  Other Topics Concern   Not on file  Social History Narrative   Not on file   Social Determinants of Health   Financial Resource Strain: Not on file  Food Insecurity: Not on file  Transportation Needs: Not on file  Physical Activity: Not on file  Stress: Not on file  Social Connections: Not on file   Additional Social History:    Allergies:   Allergies  Allergen Reactions   Peanut-Containing Drug Products Diarrhea, Anaphylaxis and Swelling   Cheese Diarrhea   Eggs Or Egg-Derived Products Diarrhea    Labs:  Results for orders placed or performed during the hospital encounter of 08/19/22 (from the past 48 hour(s))  Comprehensive metabolic panel     Status: Abnormal   Collection Time: 08/19/22  1:07 AM  Result Value Ref Range   Sodium 136 135 - 145 mmol/L   Potassium 3.1 (L) 3.5 - 5.1 mmol/L   Chloride 105 98 - 111 mmol/L   CO2 21 (L) 22 - 32 mmol/L   Glucose, Bld 162 (H) 70 - 99 mg/dL    Comment: Glucose reference range applies only to samples taken after fasting for at least 8 hours.   BUN 19 6 - 20 mg/dL   Creatinine, Ser 1.69 (H) 0.61 - 1.24 mg/dL   Calcium 9.6 8.9 - 67.8 mg/dL   Total Protein 8.8 (H) 6.5 - 8.1 g/dL   Albumin 4.8 3.5 - 5.0 g/dL   AST 43 (H) 15 - 41 U/L   ALT 21 0 - 44 U/L   Alkaline Phosphatase 61 38 - 126 U/L    Total Bilirubin 0.6 0.3 - 1.2 mg/dL   GFR, Estimated >93 >81 mL/min    Comment: (NOTE) Calculated using the CKD-EPI Creatinine Equation (2021)    Anion gap 10 5 - 15    Comment: Performed at [facility], [address] [place], [place] [address]  Ethanol     Status: None   Collection Time: 08/19/22  1:07 AM  Result Value Ref Range   Alcohol, Ethyl (B) <10 <10 mg/dL    Comment: (NOTE) Lowest detectable limit for serum alcohol is 10 mg/dL.  For medical purposes only. Performed at [facility],  [place], [place] [id number]   Salicylate level     Status: Abnormal   Collection Time: 08/19/22  1:07 AM  Result Value Ref Range   Salicylate Lvl Q000111Q (L) 7.0 - 30.0 mg/dL    Comment: Performed at [facility], [address] [place], [place] [address]  Acetaminophen level     Status: Abnormal   Collection Time: 08/19/22  1:07 AM  Result Value Ref Range   Acetaminophen (Tylenol), Serum <10 (L) 10 - 30 ug/mL    Comment: (NOTE) Therapeutic concentrations vary significantly. A range of 10-30 ug/mL  may be an effective concentration for many patients. However, some  are best treated at concentrations outside of this range. Acetaminophen concentrations >150 ug/mL at 4 hours after ingestion  and >50 ug/mL at 12 hours after ingestion are often associated with  toxic reactions.  Performed at [facility], [address] [place], [place] [address]   cbc     Status: Abnormal   Collection Time: 08/19/22  1:07 AM  Result Value Ref Range   WBC 11.7 (H) 4.0 - 10.5 K/uL   RBC 4.83 4.22 - 5.81 MIL/uL   Hemoglobin 14.1 13.0 - 17.0 g/dL   HCT 42.5 39.0 - 52.0 %   MCV 88.0 80.0 - 100.0 fL   MCH 29.2 26.0 - 34.0 pg   MCHC 33.2 30.0 - 36.0 g/dL   RDW 13.9 11.5 - 15.5 %   Platelets 243 150 - 400 K/uL   nRBC 0.0 0.0 - 0.2 %    Comment: Performed at [facility], [place], [place] [address]  Troponin I (High  Sensitivity)     Status: None   Collection Time: 08/19/22  1:07 AM  Result Value Ref Range   Troponin I (High Sensitivity) 7 <18 ng/L    Comment: (NOTE) Elevated high sensitivity troponin I (hsTnI) values and significant  changes across serial measurements may suggest ACS but many other  chronic and acute conditions are known to elevate hsTnI results.  Refer to the "Links" section for chest pain algorithms and additional  guidance. Performed at [facility], [address] [place],  [address]   Resp Panel by RT-PCR (Flu A&B, Covid) Anterior Nasal Swab     Status: None   Collection Time: 08/19/22  2:07 AM   Specimen: Anterior Nasal Swab  Result Value Ref Range   SARS Coronavirus 2 by RT PCR NEGATIVE NEGATIVE    Comment: (NOTE) SARS-CoV-2 target nucleic acids are NOT DETECTED.  The SARS-CoV-2 RNA is generally detectable in upper respiratory specimens during the acute phase of infection. The lowest concentration of SARS-CoV-2 viral copies this assay can detect is 138 copies/mL. A negative result does not preclude SARS-Cov-2 infection and should not be used as the sole basis for treatment or other patient management decisions. A negative result may occur with  improper specimen collection/handling, submission of specimen other than nasopharyngeal swab, presence of viral mutation(s) within the areas targeted by this assay, and inadequate number of viral copies(<138 copies/mL). A negative result must be combined with clinical observations, patient history, and epidemiological information. The expected result is Negative.  Fact Sheet for Patients:  EntrepreneurPulse.com.au  Fact Sheet for Healthcare Providers:  IncredibleEmployment.be  This test is no t yet approved or cleared by the [place] FDA and  has been authorized for detection and/or diagnosis of SARS-CoV-2 by FDA under an Emergency Use Authorization (EUA). This EUA  will remain  in effect (meaning this test can be used) for the duration of the COVID-19 declaration under Section 564(b)(1) of the Act, 21 U.S.C.section 360bbb-3(b)(1), unless the authorization is terminated  or revoked sooner.       Influenza A by PCR NEGATIVE NEGATIVE   Influenza B by PCR NEGATIVE NEGATIVE    Comment: (NOTE) The Xpert Xpress SARS-CoV-2/FLU/RSV plus assay is intended as an aid in the diagnosis of influenza from Nasopharyngeal swab specimens and should not be used as a sole basis for treatment. Nasal washings and aspirates are unacceptable for Xpert Xpress SARS-CoV-2/FLU/RSV testing.  Fact Sheet for Patients: EntrepreneurPulse.com.au  Fact Sheet for Healthcare Providers: IncredibleEmployment.be  This test is not yet approved or cleared by the [place] FDA and has been authorized for detection and/or diagnosis of SARS-CoV-2 by FDA under an Emergency Use Authorization (EUA). This EUA will remain in effect (meaning this test can be used) for the duration of the COVID-19 declaration under Section 564(b)(1) of the Act, 21 U.S.C. section 360bbb-3(b)(1), unless the authorization is terminated or revoked.  Performed at [facility], [address], [place], [place] [address]     Current Facility-Administered Medications  Medication Dose Route Frequency Provider Last Rate Last Admin   LORazepam (ATIVAN) injection 0-4 mg  0-4 mg Intravenous Q6H Rada Hay, MD       Or   LORazepam (ATIVAN) tablet 0-4 mg  0-4 mg Oral Q6H Rada Hay, MD   2 mg at 08/19/22 0155   [START ON 08/21/2022] LORazepam (ATIVAN) injection 0-4 mg  0-4 mg Intravenous Q12H Rada Hay, MD       Or   Derrill Memo ON 08/21/2022] LORazepam (ATIVAN) tablet 0-4 mg  0-4 mg Oral Q12H Rada Hay, MD       potassium chloride SA (KLOR-CON M) CR tablet 40 mEq  40 mEq Oral Once Rada Hay, MD       thiamine (VITAMIN B1) tablet  100 mg  100 mg Oral Daily Rada Hay, MD       Or   thiamine (VITAMIN B1) injection 100 mg  100 mg Intravenous Daily Rada Hay, MD       Current Outpatient Medications  Medication Sig Dispense Refill   divalproex (DEPAKOTE) 500 MG DR tablet Take 1 tablet (500 mg total) by mouth every 12 (twelve) hours. (Patient not taking: Reported on 04/01/2022) 60 tablet 1   gabapentin (NEURONTIN) 300 MG capsule Take 1 capsule (300 mg total) by mouth 3 (three) times daily. (Patient not taking: Reported on 04/01/2022) 90 capsule 1   haloperidol (HALDOL) 2 MG tablet Take 2 mg by mouth 2 (two) times daily.     hydrOXYzine (ATARAX) 50 MG tablet Take 1 tablet (50 mg total) by mouth every 4 (four) hours as needed for anxiety. 30 tablet 1   OLANZapine (ZYPREXA) 5 MG tablet Take 1 tablet (5 mg total) by mouth at bedtime. 30 tablet WARM   pantoprazole (PROTONIX) 40 MG tablet Take 2 tablets (80 mg total) by mouth daily. 60 tablet 1   traZODone (DESYREL) 100 MG tablet Take 1 tablet (100 mg total) by mouth at bedtime as needed for sleep. (Patient not taking: Reported on 04/01/2022) 30 tablet 1    Musculoskeletal: Strength & Muscle Tone: within normal limits Gait & Station: normal Patient leans: N/A  Psychiatric Specialty Exam:  Presentation  General Appearance: Bizarre  Eye Contact:Fair  Speech:Garbled  Speech Volume:Normal  Handedness:Right   Mood and Affect  Mood:Euthymic  Affect:Congruent   Thought Process  Thought Processes:Goal Directed  Descriptions of Associations:Intact  Orientation:Full (Time, Place and Person)  Thought Content:WDL  History of Schizophrenia/Schizoaffective disorder:Yes  Duration of Psychotic Symptoms:Greater than six months  Hallucinations:Hallucinations: None  Ideas of Reference:None  Suicidal Thoughts:Suicidal Thoughts: No  Homicidal Thoughts:Homicidal Thoughts: No   Sensorium  Memory:Immediate Fair; Recent Fair; Remote  Fair  Judgment:Fair  Insight:Lacking   Executive Functions  Concentration:Fair  Attention Span:Fair  Recall:Fair  Fund of Knowledge:Fair  Language:Fair   Psychomotor Activity  Psychomotor Activity:Psychomotor Activity: Normal   Assets  Assets:Desire for Improvement; Resilience; Social Support   Sleep  Sleep:Sleep: Good Number of Hours of Sleep: 8   Physical Exam: Physical Exam Vitals and nursing note reviewed.  Constitutional:      Appearance: Normal appearance. He is normal weight.  HENT:     Head: Normocephalic and atraumatic.     Right Ear: External ear normal.     Left Ear: External ear normal.     Nose: Nose normal.  Cardiovascular:     Rate and Rhythm: Normal rate.     Pulses: Normal pulses.  Pulmonary:     Effort: Pulmonary effort is normal.  Musculoskeletal:        General: Normal range of motion.     Cervical back: Normal range of motion and neck supple.  Neurological:     General: No focal deficit present.     Mental Status: He is alert and oriented to person, place, and time. Mental status is at baseline.  Psychiatric:        Attention and Perception: Attention and perception normal.        Mood and Affect: Mood is depressed. Affect is blunt, flat and inappropriate.        Speech: Speech is delayed.        Behavior: Behavior is withdrawn. Behavior is cooperative.        Thought Content: Thought content normal.        Cognition and Memory: Cognition and memory normal.        Judgment: Judgment is inappropriate.   Review of Systems  Psychiatric/Behavioral:  Positive for depression and substance abuse. The patient is nervous/anxious.    Blood pressure (!) 183/117, pulse (!) 111, temperature 99 F (37.2 C), resp. rate 16, height 5\' 11"  (1.803 m), weight 99.8 kg, SpO2 97 %. Body mass index is 30.68 kg/m.  Treatment Plan Summary: Plan Patient does not meet criteria for psychiatric inpatient admission. The patient would benefit from detox  treatment.  Disposition: No evidence of imminent risk to self or others at present.   Patient does not meet criteria for psychiatric inpatient admission. Supportive therapy provided about ongoing stressors. Refer to IOP. Discussed crisis plan, support from social network, calling 911, coming to the Emergency Department, and calling Suicide Hotline.  , NP 08/19/2022 4:18 AM

## 2022-08-19 NOTE — ED Notes (Signed)
Pt resources for DC in AM are left with this nurse by TTS. Will remain beside desk to be provided to pt upon DC in the AM

## 2022-08-19 NOTE — ED Notes (Addendum)
Pt dressed out in hospital scrubs with this nurse, hospital security, and Delaney Meigs, EDT in room. Pt belongings placed into hospital belongings bag and labeled appropriately with pt information Items include: 1 Black Tshirt 1 purple Tshirt 1 pair grey shoes 1 pair denim shorts 1 pair black socks 1 pair underwear 1 hat 1 wallet with non inventoried items 1 pack cigarettes 1 pair camouflage pants 1 cell phone

## 2022-08-19 NOTE — ED Notes (Signed)
Patient transferred from Triage to room 23 after dressing out and screening for contraband. Report received from [place], [place] including situation, background, assessment and recommendations. Pt oriented to [employer] including Q15 minute rounds as well as Psychologist, counselling for their protection. Patient is alert and oriented, warm and dry in no acute distress. Patient denies SI, HI, and VH. Pt. Encouraged to let this nurse know if needs arise.

## 2022-09-27 ENCOUNTER — Emergency Department
Admission: EM | Admit: 2022-09-27 | Discharge: 2022-09-28 | Disposition: A | Discharge status: Home / Self Care | Payer: No Typology Code available for payment source | Attending: Emergency Medicine | Admitting: Emergency Medicine

## 2022-09-27 ENCOUNTER — Emergency Department: Payer: No Typology Code available for payment source

## 2022-09-27 ENCOUNTER — Other Ambulatory Visit: Payer: Self-pay

## 2022-09-27 DIAGNOSIS — Z20822 Contact with and (suspected) exposure to covid-19: Secondary | ICD-10-CM | POA: Diagnosis not present

## 2022-09-27 DIAGNOSIS — R0789 Other chest pain: Secondary | ICD-10-CM | POA: Diagnosis not present

## 2022-09-27 DIAGNOSIS — F1014 Alcohol abuse with alcohol-induced mood disorder: Secondary | ICD-10-CM | POA: Diagnosis present

## 2022-09-27 DIAGNOSIS — Z5900 Homelessness unspecified: Secondary | ICD-10-CM | POA: Insufficient documentation

## 2022-09-27 DIAGNOSIS — R45851 Suicidal ideations: Secondary | ICD-10-CM

## 2022-09-27 LAB — URINE DRUG SCREEN, QUALITATIVE (ARMC ONLY)
Amphetamines, Ur Screen: POSITIVE — AB
Barbiturates, Ur Screen: NOT DETECTED
Benzodiazepine, Ur Scrn: NOT DETECTED
Cannabinoid 50 Ng, Ur ~~LOC~~: POSITIVE — AB
Cocaine Metabolite,Ur ~~LOC~~: POSITIVE — AB
MDMA (Ecstasy)Ur Screen: NOT DETECTED
Methadone Scn, Ur: NOT DETECTED
Opiate, Ur Screen: NOT DETECTED
Phencyclidine (PCP) Ur S: NOT DETECTED
Tricyclic, Ur Screen: NOT DETECTED

## 2022-09-27 LAB — COMPREHENSIVE METABOLIC PANEL
ALT: 27 U/L (ref 0–44)
AST: 49 U/L — ABNORMAL HIGH (ref 15–41)
Albumin: 4.9 g/dL (ref 3.5–5.0)
Alkaline Phosphatase: 71 U/L (ref 38–126)
Anion gap: 10 (ref 5–15)
BUN: 15 mg/dL (ref 6–20)
CO2: 24 mmol/L (ref 22–32)
Calcium: 9.9 mg/dL (ref 8.9–10.3)
Chloride: 106 mmol/L (ref 98–111)
Creatinine, Ser: 1.27 mg/dL — ABNORMAL HIGH (ref 0.61–1.24)
GFR, Estimated: >60 mL/min (ref >60)
Glucose, Bld: 101 mg/dL — ABNORMAL HIGH (ref 70–99)
Potassium: 3.8 mmol/L (ref 3.5–5.1)
Sodium: 140 mmol/L (ref 135–145)
Total Bilirubin: 0.8 mg/dL (ref 0.3–1.2)
Total Protein: 8.9 g/dL — ABNORMAL HIGH (ref 6.5–8.1)

## 2022-09-27 LAB — ACETAMINOPHEN LEVEL: Acetaminophen (Tylenol), Serum: <10 ug/mL — ABNORMAL LOW (ref 10–30)

## 2022-09-27 LAB — CBC WITH DIFFERENTIAL/PLATELET
Abs Immature Granulocytes: 0.06 10*3/uL (ref 0.00–0.07)
Basophils Absolute: 0.1 10*3/uL (ref 0.0–0.1)
Basophils Relative: 1 %
Eosinophils Absolute: 0 10*3/uL (ref 0.0–0.5)
Eosinophils Relative: 0 %
HCT: 40.6 % (ref 39.0–52.0)
Hemoglobin: 13.3 g/dL (ref 13.0–17.0)
Immature Granulocytes: 1 %
Lymphocytes Relative: 16 %
Lymphs Abs: 2.1 10*3/uL (ref 0.7–4.0)
MCH: 29 pg (ref 26.0–34.0)
MCHC: 32.8 g/dL (ref 30.0–36.0)
MCV: 88.5 fL (ref 80.0–100.0)
Monocytes Absolute: 0.8 10*3/uL (ref 0.1–1.0)
Monocytes Relative: 6 %
Neutro Abs: 10.3 10*3/uL — ABNORMAL HIGH (ref 1.7–7.7)
Neutrophils Relative %: 76 %
Platelets: 264 10*3/uL (ref 150–400)
RBC: 4.59 MIL/uL (ref 4.22–5.81)
RDW: 13.7 % (ref 11.5–15.5)
WBC: 13.3 10*3/uL — ABNORMAL HIGH (ref 4.0–10.5)
nRBC: 0 % (ref 0.0–0.2)

## 2022-09-27 LAB — SALICYLATE LEVEL: Salicylate Lvl: <7 mg/dL — ABNORMAL LOW (ref 7.0–30.0)

## 2022-09-27 LAB — ETHANOL: Alcohol, Ethyl (B): <10 mg/dL (ref <10)

## 2022-09-27 LAB — TROPONIN I (HIGH SENSITIVITY)
Troponin I (High Sensitivity): 9 ng/L (ref <18)
Troponin I (High Sensitivity): 6 ng/L (ref <18)

## 2022-09-27 LAB — RESP PANEL BY RT-PCR (FLU A&B, COVID) ARPGX2
Influenza A by PCR: NEGATIVE
Influenza B by PCR: NEGATIVE
SARS Coronavirus 2 by RT PCR: NEGATIVE

## 2022-09-27 MED ORDER — HYDROCHLOROTHIAZIDE 25 MG PO TABS
25.0000 mg | ORAL_TABLET | Freq: Every day | ORAL | Status: DC
  Administered 2022-09-27 – 2022-09-28 (×2): 25 mg via ORAL
  Filled 2022-09-27 (×2): qty 1

## 2022-09-27 MED ORDER — HALOPERIDOL 0.5 MG PO TABS
2.0000 mg | ORAL_TABLET | Freq: Two times a day (BID) | ORAL | Status: DC
  Administered 2022-09-27 – 2022-09-28 (×3): 2 mg via ORAL
  Filled 2022-09-27: qty 1
  Filled 2022-09-27 (×2): qty 4

## 2022-09-27 MED ORDER — OLANZAPINE 5 MG PO TABS
5.0000 mg | ORAL_TABLET | Freq: Every day | ORAL | Status: DC
  Administered 2022-09-27: 5 mg via ORAL
  Filled 2022-09-27 (×2): qty 1

## 2022-09-27 MED ORDER — GABAPENTIN 300 MG PO CAPS
300.0000 mg | ORAL_CAPSULE | Freq: Three times a day (TID) | ORAL | Status: DC
  Administered 2022-09-27 – 2022-09-28 (×4): 300 mg via ORAL
  Filled 2022-09-27 (×4): qty 1

## 2022-09-27 MED ORDER — PANTOPRAZOLE SODIUM 40 MG PO TBEC
80.0000 mg | DELAYED_RELEASE_TABLET | Freq: Every day | ORAL | Status: DC
  Administered 2022-09-27 – 2022-09-28 (×2): 80 mg via ORAL
  Filled 2022-09-27 (×2): qty 2

## 2022-09-27 MED ORDER — LORAZEPAM 1 MG PO TABS
1.0000 mg | ORAL_TABLET | Freq: Once | ORAL | Status: AC
  Administered 2022-09-27: 1 mg via ORAL
  Filled 2022-09-27: qty 1

## 2022-09-27 MED ORDER — HYDROXYZINE HCL 25 MG PO TABS
50.0000 mg | ORAL_TABLET | Freq: Four times a day (QID) | ORAL | Status: DC | PRN
  Administered 2022-09-27: 50 mg via ORAL
  Filled 2022-09-27: qty 2

## 2022-09-27 MED ORDER — CLONIDINE HCL 0.1 MG PO TABS: 0.1000 mg | ORAL_TABLET | Freq: Every day | ORAL | Status: DC | PRN

## 2022-09-27 NOTE — ED Notes (Addendum)
This RN was made aware by offcoming RN that patient was allowed to keep his "drugs" as long as they were kept separate from the patient. This RN and another RN went through patient belongings bags. Rn found two bottles of pills. One labeled fluoxetine and the other is unlabeled and has multiple different colored and shaped medications. Medications placed in bio hazard bag and labeled at this time.   Medications counted by RN-CG and verified by RN-JI, labeled and sent to pharmacy.  Fluoxetine 20 Large Brown capsule 68 Blue 20 Dark brown small capsule 8

## 2022-09-27 NOTE — BH Assessment (Signed)
Referral information completed and faxed to [facility].  Writer called to follow up with referral but was unable to reach anyone.

## 2022-09-27 NOTE — ED Notes (Signed)
Patient asked "Did I do something?" when this RN went to do vitals and administer Gabapentin. Discussed stress relief methods (has TV in room to watch but declined TV, has book to read). Patient now sitting in floor of doorway reading book.

## 2022-09-27 NOTE — BH Assessment (Signed)
Comprehensive Clinical Assessment (CCA) Note  09/27/2022 REG BIRCHER [id number]  Chief Complaint: Patient is a 36 year old male presenting to [facility] ED voluntarily. Per triage note Pt coming via [facility] w/o SI and chest discomfort. Pt states having suicidal thoughts but has no plan. Pt also complaining of chest discomfort, EMS gave 324 aspirin and 1 sublingual nitro Per pt, he ran out of his hctz for about a week so he has been "self medicating." During assessment patient appears alert and oriented x4, cooperative but anxious and paranoid. Patient reports "I'm trying to get stable and trying to get a better living situation." Patient reports that he is currently living in a shelter "I want to get to the [place]." Patient reports utilizing [facility] in the past to be transferred to the [place] "I need something to treat both my mental health and substance use." Patient reports drinking alcohol daily "a 40oz beer and some liquor throughout the day." Patient also reports some drug use but did not wish to disclose which drugs. Patient reports having panic attacks that are triggered by "vibrations." Patient also reports poor sleep and appetite. Patient denies current SI/HI/AH/VH.  Chief Complaint  Patient presents with   Suicidal   Chest Pain   Visit Diagnosis: Polysubstance abuse, PTSD, Depression    CCA Screening, Triage and Referral (STR)  Patient Reported Information How did you hear about [place]? Self  Referral name: No data recorded Referral phone number: No data recorded  Whom do you see for routine medical problems? No data recorded Practice/Facility Name: No data recorded Practice/Facility Phone Number: No data recorded Name of Contact: No data recorded Contact Number: No data recorded Contact Fax Number: No data recorded Prescriber Name: No data recorded Prescriber Address (if known): No data recorded  What Is the Reason for Your Visit/Call Today? Pt coming via [facility] w/o SI and chest discomfort.  Pt states having suicidal thoughts but has no plan. Pt also complaining of chest discomfort, EMS gave 324 aspirin and 1 sublingual nitro Per pt, he ran out of his hctz for about a week so he has been "self medicating".  How Long Has This Been Causing You Problems? > than 6 months  What Do You Feel Would Help You the Most Today? Treatment for Depression or other mood problem; Housing Assistance; Alcohol or Drug Use Treatment   Have You Recently Been in Any Inpatient Treatment (Hospital/Detox/Crisis Center/28-Day Program)? No data recorded Name/Location of Program/Hospital:No data recorded How Long Were You There? No data recorded When Were You Discharged? No data recorded  Have You Ever Received Services From [facility] Before? No data recorded Who Do You See at [facility]? No data recorded  Have You Recently Had Any Thoughts About Hurting Yourself? No  Are You Planning to Commit Suicide/Harm Yourself At This time? No   Have you Recently Had Thoughts About [place]? No  Explanation: No data recorded  Have You Used Any Alcohol or Drugs in the Past 24 Hours? Yes  How Long Ago Did You Use Drugs or Alcohol? No data recorded What Did You Use and How Much? Alcohol 40oz beer, patient did not wish to disclose which substances he was using   Do You Currently Have a Therapist/Psychiatrist? No  Name of Therapist/Psychiatrist: [facility] Recently Discharged From Any Mudlogger or Programs? No  Explanation of Discharge From Practice/Program: No data recorded    CCA Screening Triage Referral Assessment Type of Contact: Face-to-Face  Is this Initial or Reassessment? No data recorded Date Telepsych consult ordered in [facility]:  No data recorded Time Telepsych consult ordered in [facility]:  No data recorded  Patient Reported Information Reviewed? No data recorded Patient Left Without Being Seen? No data recorded Reason for Not Completing Assessment: No data  recorded  Collateral Involvement: None provided   Does Patient Have a [place]? No data recorded Name and Contact of Legal Guardian: No data recorded If Minor and Not Living with Parent(s), Who has Custody? N/A  Is CPS involved or ever been involved? Never  Is APS involved or ever been involved? Never   Patient Determined To Be At Risk for Harm To Self or Others Based on Review of Patient Reported Information or Presenting Complaint? No  Method: No data recorded Availability of Means: No data recorded Intent: No data recorded Notification Required: No data recorded Additional Information for Danger to Others Potential: No data recorded Additional Comments for Danger to Others Potential: No data recorded Are There Guns or Other Weapons in Your Home? No data recorded Types of Guns/Weapons: No data recorded Are These Weapons Safely Secured?                            No data recorded Who Could Verify You Are Able To Have These Secured: No data recorded Do You Have any Outstanding Charges, Pending Court Dates, Parole/Probation? No data recorded Contacted To Inform of Risk of Harm To Self or Others: Other: Comment   Location of Assessment: [facility] ED   Does Patient Present under Involuntary Commitment? No  IVC Papers Initial File Date: 12/30/21   [place] of Residence: [place]   Patient Currently Receiving the Following Services: Medication Management   Determination of Need: Emergent (2 hours)   Options For Referral: Therapeutic Triage Services; ED Visit     CCA Biopsychosocial Intake/Chief Complaint:  No data recorded Current Symptoms/Problems: No data recorded  Patient Reported Schizophrenia/Schizoaffective Diagnosis in Past: No   Strengths: pt is able to ask for help/motivated for change  Preferences: No data recorded Abilities: No data recorded  Type of Services Patient Feels are Needed: No data recorded  Initial Clinical  Notes/Concerns: No data recorded  Mental Health Symptoms Depression:   Change in energy/activity; Difficulty Concentrating; Fatigue; Hopelessness   Duration of Depressive symptoms:  Greater than two weeks   Mania:   N/A   Anxiety:    Restlessness; Worrying; Tension; Difficulty concentrating; Fatigue; Sleep   Psychosis:   None   Duration of Psychotic symptoms:  Greater than six months   Trauma:   Hypervigilance; Difficulty staying/falling asleep; Avoids reminders of event   Obsessions:   N/A   Compulsions:   N/A   Inattention:   N/A   Hyperactivity/Impulsivity:   N/A   Oppositional/Defiant Behaviors:   None   Emotional Irregularity:   Potentially harmful impulsivity   Other Mood/Personality Symptoms:  No data recorded   Mental Status Exam Appearance and self-care  Stature:   Average   Weight:   Average weight   Clothing:   Casual (In scrubs)   Grooming:   Normal   Cosmetic use:   None   Posture/gait:   Normal   Motor activity:   Restless   Sensorium  Attention:   Distractible   Concentration:   Anxiety interferes   Orientation:   Person; Place; Object; Situation; X5; Time   Recall/memory:   Normal   Affect  and Mood  Affect:   Anxious; Congruent   Mood:   Anxious; Depressed   Relating  Eye contact:   Fleeting   Facial expression:   Anxious   Attitude toward examiner:   Suspicious   Thought and Language  Speech flow:  Clear and Coherent   Thought content:   Appropriate to Mood and Circumstances   Preoccupation:   None   Hallucinations:   None   Organization:  No data recorded  [employer] of Knowledge:   Fair   Intelligence:   Average   Abstraction:   Functional   Judgement:   Fair   Reality Testing:   Adequate   Insight:   Present   Decision Making:   Vacilates   Social Functioning  Social Maturity:   Irresponsible; Impulsive   Social Judgement:   "Games developer";  Heedless   Stress  Stressors:   Housing; Teacher, music Ability:   Advice worker Deficits:   None   Supports:   Support needed; Friends/Service system     Religion: Religion/Spirituality Are You A Religious Person?: No How Might This Affect Treatment?: n/a  Leisure/Recreation: Leisure / Recreation Do You Have Hobbies?: No  Exercise/Diet: Exercise/Diet Do You Exercise?: No Have You Gained or Lost A Significant Amount of Weight in the Past Six Months?: No Do You Follow a Special Diet?: No Do You Have Any Trouble Sleeping?: Yes Explanation of Sleeping Difficulties: Patient reports poor sleep   CCA Employment/Education Employment/Work Situation: Employment / Work Situation Employment Situation: On disability Why is Patient on Disability: Medical leave of absence from the [employer] for mental health How Long has Patient Been on Disability: Pt stated he has been out of [employer], about 2008 Patient's Job has Been Impacted by Current Illness: No Has Patient ever Been in the [employer]?: Yes (Describe in comment) Did You Receive Any Psychiatric Treatment/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?: No  Education: Education Is Patient Currently Attending School?: No Last Grade Completed: 12 Did You Attend College?: No Did You Have An Individualized Education Program (IIEP): No Did You Have Any Difficulty At School?: No Patient's Education Has Been Impacted by Current Illness: No   CCA Family/Childhood History Family and Relationship History: Family history Marital status: Single Does patient have children?: Yes How many children?: 1 How is patient's relationship with their children?: Distant  Childhood History:  Childhood History By whom was/is the patient raised?: Mother Did patient suffer any verbal/emotional/physical/sexual abuse as a child?: No Did patient suffer from severe childhood neglect?: No Has patient ever been sexually abused/assaulted/raped as an  adolescent or adult?: No Was the patient ever a victim of a crime or a disaster?: No Witnessed domestic violence?: No Has patient been affected by domestic violence as an adult?: No  Child/Adolescent Assessment:     CCA Substance Use Alcohol/Drug Use: Alcohol / Drug Use Pain Medications: See PTA Prescriptions: See PTA Over the Counter: See PTA History of alcohol / drug use?: Yes Longest period of sobriety (when/how long): Unable to quantify Negative Consequences of Use: Personal relationships Withdrawal Symptoms: Tremors, Change in blood pressure, Sweats Substance #1 Name of Substance 1: Alcohol 1 - Amount (size/oz): 40oz beer 1 - Frequency: daily 1 - Last Use / Amount: 09/27/22                       ASAM's:  Six Dimensions of Multidimensional Assessment  Dimension 1:  Acute Intoxication and/or Withdrawal Potential:  Dimension 1:  Description of individual's past and current experiences of substance use and withdrawal: Pt has a chronic hx of polysubstance abuse  Dimension 2:  Biomedical Conditions and Complications:      Dimension 3:  Emotional, Behavioral, or Cognitive Conditions and Complications:     Dimension 4:  Readiness to Change:     Dimension 5:  Relapse, Continued use, or Continued Problem Potential:     Dimension 6:  Recovery/Living Environment:     ASAM Severity Score: ASAM's Severity Rating Score: 16  ASAM Recommended Level of Treatment: ASAM Recommended Level of Treatment: Level II Partial Hospitalization Treatment   Substance use Disorder (SUD) Substance Use Disorder (SUD)  Checklist Symptoms of Substance Use: Continued use despite having a persistent/recurrent physical/psychological problem caused/exacerbated by use, Continued use despite persistent or recurrent social, interpersonal problems, caused or exacerbated by use, Presence of craving or strong urge to use, Persistent desire or unsuccessful efforts to cut down or control use, Social,  occupational, recreational activities given up or reduced due to use  Recommendations for Services/Supports/Treatments: Recommendations for Services/Supports/Treatments Recommendations For Services/Supports/Treatments: SAIOP (Substance Abuse Intensive Outpatient Program), Medication Management, Detox, Individual Therapy, IOP (Intensive Outpatient Program)  DSM5 Diagnoses: Patient Active Problem List   Diagnosis Date Noted   Chest pain 02/10/2022   Polysubstance abuse ([place]) 02/10/2022   Hallucination 02/10/2022   Depression 02/10/2022   Alcohol abuse 02/10/2022   Tobacco abuse 02/10/2022   HTN (hypertension) 02/10/2022   Lung nodules 02/10/2022   Elevated troponin 02/10/2022   GERD (gastroesophageal reflux disease) 02/10/2022   Chronic kidney disease, stage 3a ([place]) 02/10/2022   Migraine 02/10/2022   Cannabis dependence, continuous ([place]) 06/18/2021   Alcohol abuse with alcohol-induced mood disorder ([place]) 06/18/2021   Crohn's disease ([place]) 06/18/2021   Nicotine dependence 06/18/2021   Peptic ulcer 06/18/2021   Severe alcohol dependence ([place]) 06/18/2021   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[place])    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/2020   Hypertension 10/19/2020   Psychosis ([place]) 06/13/2015   Posttraumatic stress disorder 06/13/2015   Traumatic brain injury ([place]) 06/13/2015   Cocaine dependence ([place]) 06/13/2015   Marijuana abuse 06/13/2015    Patient Centered Plan: Patient is on the following Treatment Plan(s):  Depression, Post Traumatic Stress Disorder, and Substance Abuse   Referrals to Alternative Service(s): Referred to Alternative Service(s):   Place:   Date:   Time:    Referred to Alternative Service(s):   Place:   Date:   Time:    Referred to Alternative Service(s):   Place:   Date:   Time:    Referred to Alternative Service(s):   Place:   Date:   Time:       @BHCOLLABOFCARE @  [employer], LCAS-A

## 2022-09-27 NOTE — ED Notes (Signed)
Snack and drink given 

## 2022-09-27 NOTE — ED Notes (Signed)
Patient's BP continues to be slightly elevated. Dr. Ellender Hose notified.

## 2022-09-27 NOTE — ED Notes (Signed)
VOL consult finished  will be TOC

## 2022-09-27 NOTE — ED Triage Notes (Addendum)
Pt coming via [facility] w/o SI and chest discomfort. Pt states having suicidal thoughts but has no plan. Pt also complaining of chest discomfort, EMS gave 324 aspirin and 1 sublingual nitro Per pt, he ran out of his hctz for about a week so he has been "self medicating" with marijuana, cocaine, MDMA.

## 2022-09-27 NOTE — ED Notes (Signed)
Patient c/o anxiety, visible mild shaking present in hands. Dr. Ellender Hose notified. Medication administered as ordered for anxiety.

## 2022-09-27 NOTE — ED Notes (Signed)
Pt repeatedly ask NT is everything ok and is he safe here. NT keeps reassuring pt that he is in a safe place.

## 2022-09-27 NOTE — ED Provider Notes (Signed)
[facility] Provider Note    Event Date/Time   First MD Initiated Contact with Patient 09/27/22 [phone]     (approximate)   History   Suicidal and Chest Pain   HPI  Jeremy Frank is a 36 y.o. male who presents to the ED for evaluation of Suicidal and Chest Pain   Patient presents to the ED for evaluation of chest pain and suicidal thoughts.  He is requesting transfer to the [facility] for psychiatric care "for at least 30 days."  He reports he is homeless and has nowhere to go.  He has no discrete plan to harm himself or any formulated plans of what he would do.  Reports he has been out of his HCTZ for at least 1 week and is concerned about his blood pressure.  Reports using multiple recreational drugs to try to "self medicate" with cannabis, cocaine and MDMA.   Physical Exam   Triage Vital Signs: ED Triage Vitals  Enc Vitals Group     BP 09/27/22 0546 (!) 174/119     Pulse Rate 09/27/22 0546 (!) 111     Resp 09/27/22 0546 20     Temp 09/27/22 0546 99.7 F (37.6 C)     Temp Source 09/27/22 0546 Oral     SpO2 09/27/22 0546 98 %     Weight 09/27/22 0512 230 lb (104.3 kg)     Height 09/27/22 0512 5\' 11"  (1.803 m)     Head Circumference --      Peak Flow --      Pain Score 09/27/22 0511 6     Pain Loc --      Pain Edu? --      Excl. in [place]? --     Most recent vital signs: Vitals:   09/27/22 0546  BP: (!) 174/119  Pulse: (!) 111  Resp: 20  Temp: 99.7 F (37.6 C)  SpO2: 98%    General: Awake, no distress.  Somewhat flat affect.  Pleasant and conversational.  Redirectable and follows commands in all 4 extremities. CV:  Good peripheral perfusion.  Resp:  Normal effort.  Abd:  No distention.  MSK:  No deformity noted.  Neuro:  No focal deficits appreciated. Other:     ED Results / Procedures / Treatments   Labs (all labs ordered are listed, but only abnormal results are displayed) Labs Reviewed  COMPREHENSIVE METABOLIC PANEL - Abnormal;  Notable for the following components:      Result Value   Glucose, Bld 101 (*)    Creatinine, Ser 1.27 (*)    Total Protein 8.9 (*)    AST 49 (*)    All other components within normal limits  CBC WITH DIFFERENTIAL/PLATELET - Abnormal; Notable for the following components:   WBC 13.3 (*)    Neutro Abs 10.3 (*)    All other components within normal limits  ACETAMINOPHEN LEVEL - Abnormal; Notable for the following components:   Acetaminophen (Tylenol), Serum <10 (*)    All other components within normal limits  SALICYLATE LEVEL - Abnormal; Notable for the following components:   Salicylate Lvl <6.9 (*)    All other components within normal limits  RESP PANEL BY RT-PCR (FLU A&B, COVID) ARPGX2  ETHANOL  URINE DRUG SCREEN, QUALITATIVE ([facility] ONLY)  TROPONIN I (HIGH SENSITIVITY)  TROPONIN I (HIGH SENSITIVITY)    EKG Sinus tachycardia with a rate of 119 bpm.  Normal axis.  Normal intervals.  Nonspecific ST changes inferiorly  and laterally without STEMI.  RADIOLOGY CXR interpreted by me without evidence of acute cardiopulmonary pathology.  Official radiology report(s): DG Chest 2 View  Result Date: 09/27/2022 CLINICAL DATA:  Chest pain EXAM: CHEST - 2 VIEW COMPARISON:  02/10/2022 FINDINGS: The heart size and mediastinal contours are within normal limits. Both lungs are clear. The visualized skeletal structures are unremarkable. IMPRESSION: No active cardiopulmonary disease. Electronically Signed   By: Signa Kell M.D.   On: 09/27/2022 06:00    PROCEDURES and INTERVENTIONS:  .1-3 Lead EKG Interpretation  Performed by: Delton Prairie, MD Authorized by: Delton Prairie, MD     Interpretation: normal     ECG rate:  99   ECG rate assessment: normal     Rhythm: sinus rhythm     Ectopy: none     Conduction: normal     Medications - No data to display   IMPRESSION / MDM / ASSESSMENT AND PLAN / ED COURSE  I reviewed the triage vital signs and the nursing notes.  Differential  diagnosis includes, but is not limited to, ACS, PTX, PNA, muscle strain/spasm, PE, dissection, malingering, cocaine intoxication  {Patient presents with symptoms of an acute illness or injury that is potentially life-threatening.  36 year old male with history of polysubstance abuse presents to the ED with chest discomfort and suicidal thoughts.  EKG with sinus tachycardia.  First troponin is negative.  Metabolic panel without electrolyte derangements and his CBC is noted to have a mild leukocytosis.  I doubt sepsis or infectious etiologies of his symptoms.  Clear CXR.  He is signed out to oncoming provider to follow-up on a second troponin and consult with psychiatry.  He wants to be here and I suspect he would be suitable to remain voluntary at this point.  Clinical Course as of 09/27/22 0754  Sat Sep 27, 2022  0509 The patient has been placed in psychiatric observation due to the need to provide a safe environment for the patient while obtaining psychiatric consultation and evaluation, as well as ongoing medical and medication management to treat the patient's condition.  The patient has not been placed under full IVC at this time.   [DS]    Clinical Course User Index [DS] Delton Prairie, MD     FINAL CLINICAL IMPRESSION(S) / ED DIAGNOSES   Final diagnoses:  Other chest pain  Suicidal ideation     Rx / DC Orders   ED Discharge Orders     None        Note:  This document was prepared using Dragon voice recognition software and may include unintentional dictation errors.   Delton Prairie, MD 09/27/22 [phone]

## 2022-09-27 NOTE — ED Notes (Signed)
Pt given water upon his request

## 2022-09-27 NOTE — ED Notes (Signed)
Pt belongings: Editor, commissioning Black shorts Camouflage shorts Brown belt Green hat Black socks Pearline Cables bedroom slippers Black lanyard Black hairtie $2 (2 $1 dollar bills) Change Black Physicist, medical

## 2022-09-27 NOTE — Consult Note (Addendum)
[facility] Face-to-Face Psychiatry Consult   Reason for Consult:  substance abuse with suicidal ideations Referring Physician:  EDP Patient Identification: Jeremy Frank MRN:  [id number] Principal Diagnosis: Alcohol abuse with alcohol-induced mood disorder (HCC) Diagnosis:  Principal Problem:   Alcohol abuse with alcohol-induced mood disorder (HCC)   Total Time spent with patient: 45 minutes  Subjective:   Jeremy Frank is a 36 y.o. male patient admitted with polysubstance abuse with .  HPI:  Client presented for detox after using cocaine, alcohol, and some meth.  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  No suicidal/homicidal ideations, hallucinations.  He is experiencing some muscle twitching, gabapentin order placed, psych cleared for rehab/detox.  HPI per Andee Poles, TTS: Patient is a 36 year old male presenting to [facility] ED voluntarily. Per triage note Pt coming via [facility] w/o SI and chest discomfort. Pt states having suicidal thoughts but has no plan. Pt also complaining of chest discomfort, EMS gave 324 aspirin and 1 sublingual nitro Per pt, he ran out of his hctz for about a week so he has been "self medicating." During assessment patient appears alert and oriented x4, cooperative but anxious and paranoid. Patient reports "I'm trying to get stable and trying to get a better living situation." Patient reports that he is currently living in a shelter "I want to get to the [place]." Patient reports utilizing [facility] in the past to be transferred to the [place] "I need something to treat both my mental health and substance use." Patient reports drinking alcohol daily "a 40oz beer and some liquor throughout the day." Patient also reports some drug use but did not wish to disclose which drugs. Patient reports having panic attacks that are triggered by "vibrations." Patient also reports poor sleep and appetite. Patient denies current SI/HI/AH/VH.   Past  Psychiatric History: polysubstance abuse, PTSD, depression, anxiety  Risk to Self:  none Risk to Others:  none Prior Inpatient Therapy:  multiple [facility]  Past Medical History:  Past Medical History:  Diagnosis Date   Drug dependence (HCC)    GI bleed    Hiatal hernia    Hypertension    Migraine    PTSD (post-traumatic stress disorder)     Past Surgical History:  Procedure Laterality Date   KNEE SURGERY     Family History:  Family History  Problem Relation Age of Onset   Heart failure Mother    Hypertension Father    Family Psychiatric  History: none Social History:  Social History   Substance and Sexual Activity  Alcohol Use Yes   Alcohol/week: 42.0 standard drinks of alcohol   Types: 42 Cans of beer per week   Comment: per pt he drinks 6-12 cans of beer daily     Social History   Substance and Sexual Activity  Drug Use Yes   Types: Cocaine, Marijuana, Amphetamines    Social History   Socioeconomic History   Marital status: Single    Spouse name: Not on file   Number of children: Not on file   Years of education: Not on file   Highest education level: Not on file  Occupational History   Not on file  Tobacco Use   Smoking status: Every Day    Packs/day: 0.25    Types: Cigarettes   Smokeless tobacco: Never  Vaping Use   Vaping Use: Never used  Substance and  Sexual Activity   Alcohol use: Yes    Alcohol/week: 42.0 standard drinks of alcohol    Types: 42 Cans of beer per week    Comment: per pt he drinks 6-12 cans of beer daily   Drug use: Yes    Types: Cocaine, Marijuana, Amphetamines   Sexual activity: Not Currently  Other Topics Concern   Not on file  Social History Narrative   Not on file   Social Determinants of Health   Financial Resource Strain: Not on file  Food Insecurity: Not on file  Transportation Needs: Not on file  Physical Activity: Not on file  Stress: Not on file  Social Connections: Not on file    Additional Social History:    Allergies:   Allergies  Allergen Reactions   Peanut-Containing Drug Products Diarrhea, Anaphylaxis and Swelling   Cheese Diarrhea   Eggs Or Egg-Derived Products Diarrhea    Labs:  Results for orders placed or performed during the hospital encounter of 09/27/22 (from the past 48 hour(s))  Troponin I (High Sensitivity)     Status: None   Collection Time: 09/27/22  5:29 AM  Result Value Ref Range   Troponin I (High Sensitivity) 9 <18 ng/L    Comment: (NOTE) Elevated high sensitivity troponin I (hsTnI) values and significant  changes across serial measurements may suggest ACS but many other  chronic and acute conditions are known to elevate hsTnI results.  Refer to the "Links" section for chest pain algorithms and additional  guidance. Performed at [facility], [address] [place], [place] [address]   Resp Panel by RT-PCR (Flu A&B, Covid) Anterior Nasal Swab     Status: None   Collection Time: 09/27/22  5:29 AM   Specimen: Anterior Nasal Swab  Result Value Ref Range   SARS Coronavirus 2 by RT PCR NEGATIVE NEGATIVE    Comment: (NOTE) SARS-CoV-2 target nucleic acids are NOT DETECTED.  The SARS-CoV-2 RNA is generally detectable in upper respiratory specimens during the acute phase of infection. The lowest concentration of SARS-CoV-2 viral copies this assay can detect is 138 copies/mL. A negative result does not preclude SARS-Cov-2 infection and should not be used as the sole basis for treatment or other patient management decisions. A negative result may occur with  improper specimen collection/handling, submission of specimen other than nasopharyngeal swab, presence of viral mutation(s) within the areas targeted by this assay, and inadequate number of viral copies(<138 copies/mL). A negative result must be combined with clinical observations, patient history, and epidemiological information. The expected result is  Negative.  Fact Sheet for Patients:  BloggerCourse.com  Fact Sheet for Healthcare Providers:  SeriousBroker.it  This test is no t yet approved or cleared by the [place] FDA and  has been authorized for detection and/or diagnosis of SARS-CoV-2 by FDA under an Emergency Use Authorization (EUA). This EUA will remain  in effect (meaning this test can be used) for the duration of the COVID-19 declaration under Section 564(b)(1) of the Act, 21 U.S.C.section 360bbb-3(b)(1), unless the authorization is terminated  or revoked sooner.       Influenza A by PCR NEGATIVE NEGATIVE   Influenza B by PCR NEGATIVE NEGATIVE    Comment: (NOTE) The Xpert Xpress SARS-CoV-2/FLU/RSV plus assay is intended as an aid in the diagnosis of influenza from Nasopharyngeal swab specimens and should not be used as a sole basis for treatment. Nasal washings and aspirates are unacceptable for Xpert Xpress SARS-CoV-2/FLU/RSV testing.  Fact Sheet for Patients: BloggerCourse.com  Fact Sheet for Healthcare Providers: IncredibleEmployment.be  This test is not yet approved or cleared by the [place] FDA and has been authorized for detection and/or diagnosis of SARS-CoV-2 by FDA under an Emergency Use Authorization (EUA). This EUA will remain in effect (meaning this test can be used) for the duration of the COVID-19 declaration under Section 564(b)(1) of the Act, 21 U.S.C. section 360bbb-3(b)(1), unless the authorization is terminated or revoked.  Performed at [facility], [address] [place], [place] [address]   Comprehensive metabolic panel     Status: Abnormal   Collection Time: 09/27/22  5:29 AM  Result Value Ref Range   Sodium 140 135 - 145 mmol/L   Potassium 3.8 3.5 - 5.1 mmol/L   Chloride 106 98 - 111 mmol/L   CO2 24 22 - 32 mmol/L   Glucose, Bld 101 (H) 70 - 99 mg/dL    Comment:  Glucose reference range applies only to samples taken after fasting for at least 8 hours.   BUN 15 6 - 20 mg/dL   Creatinine, Ser 1.27 (H) 0.61 - 1.24 mg/dL   Calcium 9.9 8.9 - 10.3 mg/dL   Total Protein 8.9 (H) 6.5 - 8.1 g/dL   Albumin 4.9 3.5 - 5.0 g/dL   AST 49 (H) 15 - 41 U/L   ALT 27 0 - 44 U/L   Alkaline Phosphatase 71 38 - 126 U/L   Total Bilirubin 0.8 0.3 - 1.2 mg/dL   GFR, Estimated >60 >60 mL/min    Comment: (NOTE) Calculated using the CKD-EPI Creatinine Equation (2021)    Anion gap 10 5 - 15    Comment: Performed at [facility], [address] [place], [place] [address]  Ethanol     Status: None   Collection Time: 09/27/22  5:29 AM  Result Value Ref Range   Alcohol, Ethyl (B) <10 <10 mg/dL    Comment: (NOTE) Lowest detectable limit for serum alcohol is 10 mg/dL.  For medical purposes only. Performed at [facility], [address] [place], [place] [address]   CBC with Diff     Status: Abnormal   Collection Time: 09/27/22  5:29 AM  Result Value Ref Range   WBC 13.3 (H) 4.0 - 10.5 K/uL   RBC 4.59 4.22 - 5.81 MIL/uL   Hemoglobin 13.3 13.0 - 17.0 g/dL   HCT 40.6 39.0 - 52.0 %   MCV 88.5 80.0 - 100.0 fL   MCH 29.0 26.0 - 34.0 pg   MCHC 32.8 30.0 - 36.0 g/dL   RDW 13.7 11.5 - 15.5 %   Platelets 264 150 - 400 K/uL   nRBC 0.0 0.0 - 0.2 %   Neutrophils Relative % 76 %   Neutro Abs 10.3 (H) 1.7 - 7.7 K/uL   Lymphocytes Relative 16 %   Lymphs Abs 2.1 0.7 - 4.0 K/uL   Monocytes Relative 6 %   Monocytes Absolute 0.8 0.1 - 1.0 K/uL   Eosinophils Relative 0 %   Eosinophils Absolute 0.0 0.0 - 0.5 K/uL   Basophils Relative 1 %   Basophils Absolute 0.1 0.0 - 0.1 K/uL   Immature Granulocytes 1 %   Abs Immature Granulocytes 0.06 0.00 - 0.07 K/uL    Comment: Performed at [facility], [address] [place], [place] [address]  Acetaminophen level     Status: Abnormal   Collection Time: 09/27/22  5:29 AM  Result Value Ref Range    Acetaminophen (Tylenol), Serum <10 (L)  10 - 30 ug/mL    Comment: (NOTE) Therapeutic concentrations vary significantly. A range of 10-30 ug/mL  may be an effective concentration for many patients. However, some  are best treated at concentrations outside of this range. Acetaminophen concentrations >150 ug/mL at 4 hours after ingestion  and >50 ug/mL at 12 hours after ingestion are often associated with  toxic reactions.  Performed at [facility], [address] [place], [place] [address]   Salicylate level     Status: Abnormal   Collection Time: 09/27/22  5:29 AM  Result Value Ref Range   Salicylate Lvl <7.0 (L) 7.0 - 30.0 mg/dL    Comment: Performed at [facility], [address] [place], [place] [address]  Troponin I (High Sensitivity)     Status: None   Collection Time: 09/27/22  7:55 AM  Result Value Ref Range   Troponin I (High Sensitivity) 6 <18 ng/L    Comment: (NOTE) Elevated high sensitivity troponin I (hsTnI) values and significant  changes across serial measurements may suggest ACS but many other  chronic and acute conditions are known to elevate hsTnI results.  Refer to the "Links" section for chest pain algorithms and additional  guidance. Performed at [facility], [address] [place], [place] [address]     Current Facility-Administered Medications  Medication Dose Route Frequency Provider Last Rate Last Admin   gabapentin (NEURONTIN) capsule 300 mg  300 mg Oral TID Charm Rings, NP       haloperidol (HALDOL) tablet 2 mg  2 mg Oral BID Shaune Pollack, MD   2 mg at 09/27/22 7062   hydrOXYzine (ATARAX) tablet 50 mg  50 mg Oral Q6H PRN Shaune Pollack, MD       OLANZapine (ZYPREXA) tablet 5 mg  5 mg Oral QHS Shaune Pollack, MD       pantoprazole (PROTONIX) EC tablet 80 mg  80 mg Oral Daily Shaune Pollack, MD   80 mg at 09/27/22 3762   Current Outpatient Medications  Medication Sig Dispense Refill   divalproex (DEPAKOTE)  500 MG DR tablet Take 1 tablet (500 mg total) by mouth every 12 (twelve) hours. (Patient not taking: Reported on 04/01/2022) 60 tablet 1   gabapentin (NEURONTIN) 300 MG capsule Take 1 capsule (300 mg total) by mouth 3 (three) times daily. (Patient not taking: Reported on 04/01/2022) 90 capsule 1   haloperidol (HALDOL) 2 MG tablet Take 2 mg by mouth 2 (two) times daily.     hydrOXYzine (ATARAX) 50 MG tablet Take 1 tablet (50 mg total) by mouth every 4 (four) hours as needed for anxiety. 30 tablet 1   OLANZapine (ZYPREXA) 5 MG tablet Take 1 tablet (5 mg total) by mouth at bedtime. 30 tablet WARM   pantoprazole (PROTONIX) 40 MG tablet Take 2 tablets (80 mg total) by mouth daily. 60 tablet 1   traZODone (DESYREL) 100 MG tablet Take 1 tablet (100 mg total) by mouth at bedtime as needed for sleep. (Patient not taking: Reported on 04/01/2022) 30 tablet 1    Musculoskeletal: Strength & Muscle Tone: within normal limits Gait & Station: normal Patient leans: N/A  Psychiatric Specialty Exam: Physical Exam Vitals and nursing note reviewed.  Constitutional:      Appearance: He is well-developed.  HENT:     Head: Normocephalic.  Musculoskeletal:        General: Normal range of motion.  Neurological:     Mental Status: He is alert.  Psychiatric:  Attention and Perception: Attention and perception normal.        Mood and Affect: Mood is anxious. Affect is blunt.        Speech: Speech normal.        Behavior: Behavior normal. Behavior is cooperative.        Thought Content: Thought content normal.        Cognition and Memory: Cognition and memory normal.        Judgment: Judgment normal.     Review of Systems  Psychiatric/Behavioral:  Positive for substance abuse. The patient is nervous/anxious.   All other systems reviewed and are negative.   Blood pressure (!) 167/99, pulse (!) 113, temperature 98.8 F (37.1 C), temperature source Oral, resp. rate 18, height 5\' 11"  (1.803 m), weight 104.3  kg, SpO2 100 %.Body mass index is 32.08 kg/m.  General Appearance: Casual  Eye Contact:  Good  Speech:  Normal Rate  Volume:  Normal  Mood:  Anxious  Affect:  Congruent  Thought Process:  Coherent and Descriptions of Associations: Intact  Orientation:  Full (Time, Place, and Person)  Thought Content:  WDL and Logical  Suicidal Thoughts:  No  Homicidal Thoughts:  No  Memory:  Immediate;   Good Recent;   Good Remote;   Good  Judgement:  Fair  Insight:  Fair  Psychomotor Activity:  Normal  Concentration:  Concentration: Fair and Attention Span: Fair  Recall:  [employer] of Knowledge:  Fair  Language:  Good  Akathisia:  No  Handed:  Right  AIMS (if indicated):     Assets:  Leisure Time Physical Health Resilience Social Support  ADL's:  Intact  Cognition:  WNL  Sleep:        Physical Exam: Physical Exam Vitals and nursing note reviewed.  Constitutional:      Appearance: He is well-developed.  HENT:     Head: Normocephalic.  Musculoskeletal:        General: Normal range of motion.  Neurological:     Mental Status: He is alert.  Psychiatric:        Attention and Perception: Attention and perception normal.        Mood and Affect: Mood is anxious. Affect is blunt.        Speech: Speech normal.        Behavior: Behavior normal. Behavior is cooperative.        Thought Content: Thought content normal.        Cognition and Memory: Cognition and memory normal.        Judgment: Judgment normal.    Review of Systems  Psychiatric/Behavioral:  Positive for substance abuse. The patient is nervous/anxious.   All other systems reviewed and are negative.  Blood pressure (!) 167/99, pulse (!) 113, temperature 98.8 F (37.1 C), temperature source Oral, resp. rate 18, height 5\' 11"  (1.803 m), weight 104.3 kg, SpO2 100 %. Body mass index is 32.08 kg/m.  Treatment Plan Summary: Polysubstance abuse: Gabap[facility]  PTSD: Haldol 2 mg BID Zyprexa 5 mg  at bedtime  Anxiety: Hydroxyzine 50 mg every six hours PRN  Disposition: No evidence of imminent risk to self or others at present.   Does not meet criteria for inpatient hospitalization  Nanine Means, NP 09/27/2022 10:42 AM

## 2022-09-27 NOTE — ED Notes (Signed)
Pt given supplies to take a shower. On assessment pt denies SI/HI/AVH. No complaints at this time other than taking a shower. Oriented to unit and rules and he verbalized understanding of the same.

## 2022-09-27 NOTE — ED Notes (Signed)
VOL/pending psych consult 

## 2022-09-27 NOTE — ED Notes (Signed)
Pt states " I am feeling very anxious and my BP is still really high".  MD made aware of high BP and requested BP meds.

## 2022-09-27 NOTE — ED Notes (Signed)
Patient given cup to obtain urine sample. Went to restroom to urinate, but came back out with empty cup. Patient asking to be transferred to [place]. Awaiting psych eval. Informed patient that urine sample would be needed for transfer if transfer is indicated.

## 2022-09-28 NOTE — ED Notes (Signed)
Breakfast tray provided. 

## 2022-09-28 NOTE — ED Notes (Signed)
VS ASSESSED AND SHOWER SUPPLIES GIVEN. PT IS CURRENTLY TAKING A SHOWER.

## 2022-09-28 NOTE — ED Notes (Addendum)
Pt given belongings back to dress out. Pt is ready to go.

## 2022-09-28 NOTE — BH Assessment (Signed)
Writer spoke with the patient to complete an updated/reassessment. Patient denies SI/HI and AV/H. 

## 2022-09-28 NOTE — ED Notes (Signed)
[facility]pending [facility]

## 2022-09-28 NOTE — ED Provider Notes (Signed)
Emergency Medicine Observation Re-evaluation Note  Physical Exam   BP (!) 168/113 (BP Location: Right Arm)   Pulse 99   Temp 98.8 F (37.1 C) (Oral)   Resp 18   Ht 5\' 11"  (1.803 m)   Wt 104.3 kg   SpO2 98%   BMI 32.08 kg/m   Pt is calm and resting, respirations unlabored, and appears comfortable.   ED Course / MDM   No reported events during my shift at the time of this note.   Pt is awaiting dispo from psych   Lucillie Garfinkel MD    Lucillie Garfinkel, MD 09/28/22 [id number]

## 2022-10-11 ENCOUNTER — Emergency Department
Admission: EM | Admit: 2022-10-11 | Discharge: 2022-10-12 | Disposition: A | Discharge status: Home / Self Care | Payer: No Typology Code available for payment source | Attending: Emergency Medicine | Admitting: Emergency Medicine

## 2022-10-11 ENCOUNTER — Encounter: Payer: Self-pay | Admitting: Emergency Medicine

## 2022-10-11 ENCOUNTER — Other Ambulatory Visit: Payer: Self-pay

## 2022-10-11 DIAGNOSIS — F1424 Cocaine dependence with cocaine-induced mood disorder: Secondary | ICD-10-CM | POA: Diagnosis not present

## 2022-10-11 DIAGNOSIS — Z20822 Contact with and (suspected) exposure to covid-19: Secondary | ICD-10-CM | POA: Diagnosis not present

## 2022-10-11 DIAGNOSIS — F22 Delusional disorders: Secondary | ICD-10-CM | POA: Insufficient documentation

## 2022-10-11 DIAGNOSIS — R079 Chest pain, unspecified: Secondary | ICD-10-CM | POA: Insufficient documentation

## 2022-10-11 DIAGNOSIS — F1910 Other psychoactive substance abuse, uncomplicated: Secondary | ICD-10-CM | POA: Diagnosis not present

## 2022-10-11 DIAGNOSIS — F1414 Cocaine abuse with cocaine-induced mood disorder: Secondary | ICD-10-CM | POA: Diagnosis not present

## 2022-10-11 DIAGNOSIS — R45851 Suicidal ideations: Secondary | ICD-10-CM

## 2022-10-11 DIAGNOSIS — F410 Panic disorder [episodic paroxysmal anxiety] without agoraphobia: Secondary | ICD-10-CM | POA: Insufficient documentation

## 2022-10-11 LAB — CBC
HCT: 44.4 % (ref 39.0–52.0)
Hemoglobin: 15 g/dL (ref 13.0–17.0)
MCH: 29.4 pg (ref 26.0–34.0)
MCHC: 33.8 g/dL (ref 30.0–36.0)
MCV: 86.9 fL (ref 80.0–100.0)
Platelets: 337 10*3/uL (ref 150–400)
RBC: 5.11 MIL/uL (ref 4.22–5.81)
RDW: 12.7 % (ref 11.5–15.5)
WBC: 13.2 10*3/uL — ABNORMAL HIGH (ref 4.0–10.5)
nRBC: 0 % (ref 0.0–0.2)

## 2022-10-11 LAB — BASIC METABOLIC PANEL
Anion gap: 11 (ref 5–15)
BUN: 15 mg/dL (ref 6–20)
CO2: 21 mmol/L — ABNORMAL LOW (ref 22–32)
Calcium: 9.8 mg/dL (ref 8.9–10.3)
Chloride: 106 mmol/L (ref 98–111)
Creatinine, Ser: 1.53 mg/dL — ABNORMAL HIGH (ref 0.61–1.24)
GFR, Estimated: 59 mL/min — ABNORMAL LOW (ref >60)
Glucose, Bld: 97 mg/dL (ref 70–99)
Potassium: 3.4 mmol/L — ABNORMAL LOW (ref 3.5–5.1)
Sodium: 138 mmol/L (ref 135–145)

## 2022-10-11 LAB — RESP PANEL BY RT-PCR (FLU A&B, COVID) ARPGX2
Influenza A by PCR: NEGATIVE
Influenza B by PCR: NEGATIVE
SARS Coronavirus 2 by RT PCR: NEGATIVE

## 2022-10-11 LAB — TROPONIN I (HIGH SENSITIVITY): Troponin I (High Sensitivity): 8 ng/L (ref <18)

## 2022-10-11 MED ORDER — GABAPENTIN 300 MG PO CAPS
300.0000 mg | ORAL_CAPSULE | Freq: Three times a day (TID) | ORAL | Status: DC
  Administered 2022-10-11 – 2022-10-12 (×3): 300 mg via ORAL
  Filled 2022-10-11 (×3): qty 1

## 2022-10-11 MED ORDER — TRAZODONE HCL 100 MG PO TABS
100.0000 mg | ORAL_TABLET | Freq: Every evening | ORAL | Status: DC | PRN
  Filled 2022-10-11: qty 1

## 2022-10-11 MED ORDER — TERBINAFINE HCL 1 % EX CREA
TOPICAL_CREAM | Freq: Two times a day (BID) | CUTANEOUS | Status: DC
  Filled 2022-10-11: qty 12

## 2022-10-11 MED ORDER — TOLNAFTATE 1 % EX POWD: Freq: Two times a day (BID) | CUTANEOUS | Status: DC

## 2022-10-11 MED ORDER — HYDROXYZINE HCL 25 MG PO TABS
50.0000 mg | ORAL_TABLET | ORAL | Status: DC | PRN
  Administered 2022-10-11: 50 mg via ORAL
  Filled 2022-10-11 (×2): qty 2

## 2022-10-11 MED ORDER — PANTOPRAZOLE SODIUM 40 MG PO TBEC
80.0000 mg | DELAYED_RELEASE_TABLET | Freq: Every day | ORAL | Status: DC
  Administered 2022-10-11 – 2022-10-12 (×2): 80 mg via ORAL
  Filled 2022-10-11 (×2): qty 2

## 2022-10-11 MED ORDER — CLONIDINE HCL 0.1 MG PO TABS
0.1000 mg | ORAL_TABLET | Freq: Every day | ORAL | Status: DC
  Administered 2022-10-12: 0.1 mg via ORAL
  Filled 2022-10-11: qty 1

## 2022-10-11 MED ORDER — DIVALPROEX SODIUM 500 MG PO DR TAB
500.0000 mg | DELAYED_RELEASE_TABLET | Freq: Two times a day (BID) | ORAL | Status: DC
  Administered 2022-10-11 – 2022-10-12 (×2): 500 mg via ORAL
  Filled 2022-10-11 (×2): qty 1
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
## 2022-10-11 NOTE — Consult Note (Addendum)
[facility] Face-to-Face Psychiatry Consult   Reason for Consult:  substance abuse with suicidal ideations Referring Physician:  EDP Patient Identification: BRIGHTON PILLEY MRN:  [id number] Principal Diagnosis: Cocaine abuse with cocaine-induced mood disorder (HCC) Diagnosis:  Principal Problem:   Cocaine abuse with cocaine-induced mood disorder (HCC)   Total Time spent with patient: 45 minutes  Subjective:   Jeremy Frank is a 36 y.o. male patient admitted with polysubstance abuse with .  HPI:  Client presented to the ED with chest pain after using cocaine and meth.  He is calm but suicidal, cannot contract for safety.  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  Now, he wants to return as he is tired of living on the streets and using drugs. His mother is willing to put him on the train to the [place] but he is not safe. No homicidal ideations, hallucinations, or withdrawal symptoms.  Work on transfer to the [place].  Past Psychiatric History: polysubstance abuse, PTSD, depression, anxiety  Risk to Self:  none Risk to Others:  none Prior Inpatient Therapy:  multiple [facility]  Past Medical History:  Past Medical History:  Diagnosis Date   Drug dependence (HCC)    GI bleed    Hiatal hernia    Hypertension    Migraine    PTSD (post-traumatic stress disorder)     Past Surgical History:  Procedure Laterality Date   KNEE SURGERY     Family History:  Family History  Problem Relation Age of Onset   Heart failure Mother    Hypertension Father    Family Psychiatric  History: none Social History:  Social History   Substance and Sexual Activity  Alcohol Use Yes   Alcohol/week: 42.0 standard drinks of alcohol   Types: 42 Cans of beer per week   Comment: per pt he drinks 6-12 cans of beer daily     Social History   Substance and Sexual Activity  Drug Use Yes   Types: Cocaine, Marijuana,  Amphetamines    Social History   Socioeconomic History   Marital status: Single    Spouse name: Not on file   Number of children: Not on file   Years of education: Not on file   Highest education level: Not on file  Occupational History   Not on file  Tobacco Use   Smoking status: Every Day    Packs/day: 0.25    Types: Cigarettes   Smokeless tobacco: Never  Vaping Use   Vaping Use: Never used  Substance and Sexual Activity   Alcohol use: Yes    Alcohol/week: 42.0 standard drinks of alcohol    Types: 42 Cans of beer per week    Comment: per pt he drinks 6-12 cans of beer daily   Drug use: Yes    Types: Cocaine, Marijuana, Amphetamines   Sexual activity: Not Currently  Other Topics Concern   Not on file  Social History Narrative   Not on file   Social Determinants of Health   Financial Resource Strain: Not on file  Food Insecurity: Not on file  Transportation Needs: Not on file  Physical Activity: Not on file  Stress: Not on file  Social Connections: Not on file   Additional Social History:    Allergies:   Allergies  Allergen Reactions   Peanut-Containing Drug Products Diarrhea, Anaphylaxis and Swelling   Cheese Diarrhea  Eggs Or Egg-Derived Products Diarrhea    Labs:  Results for orders placed or performed during the hospital encounter of 10/11/22 (from the past 48 hour(s))  CBC     Status: Abnormal   Collection Time: 10/11/22  1:48 PM  Result Value Ref Range   WBC 13.2 (H) 4.0 - 10.5 K/uL   RBC 5.11 4.22 - 5.81 MIL/uL   Hemoglobin 15.0 13.0 - 17.0 g/dL   HCT 88.5 02.7 - 74.1 %   MCV 86.9 80.0 - 100.0 fL   MCH 29.4 26.0 - 34.0 pg   MCHC 33.8 30.0 - 36.0 g/dL   RDW 28.7 86.7 - 67.2 %   Platelets 337 150 - 400 K/uL   nRBC 0.0 0.0 - 0.2 %    Comment: Performed at [facility], [address] [place], [place] [address]  Basic metabolic panel     Status: Abnormal   Collection Time: 10/11/22  1:48 PM  Result Value Ref Range   Sodium 138 135  - 145 mmol/L   Potassium 3.4 (L) 3.5 - 5.1 mmol/L   Chloride 106 98 - 111 mmol/L   CO2 21 (L) 22 - 32 mmol/L   Glucose, Bld 97 70 - 99 mg/dL    Comment: Glucose reference range applies only to samples taken after fasting for at least 8 hours.   BUN 15 6 - 20 mg/dL   Creatinine, Ser 9.62 (H) 0.61 - 1.24 mg/dL   Calcium 9.8 8.9 - 83.6 mg/dL   GFR, Estimated 59 (L) >60 mL/min    Comment: (NOTE) Calculated using the CKD-EPI Creatinine Equation (2021)    Anion gap 11 5 - 15    Comment: Performed at [facility], [address] [place], [place] [address]  Troponin I (High Sensitivity)     Status: None   Collection Time: 10/11/22  1:48 PM  Result Value Ref Range   Troponin I (High Sensitivity) 8 <18 ng/L    Comment: (NOTE) Elevated high sensitivity troponin I (hsTnI) values and significant  changes across serial measurements may suggest ACS but many other  chronic and acute conditions are known to elevate hsTnI results.  Refer to the "Links" section for chest pain algorithms and additional  guidance. Performed at [facility], [address] [place], [place] [address]     No current facility-administered medications for this encounter.   Current Outpatient Medications  Medication Sig Dispense Refill   divalproex (DEPAKOTE) 500 MG DR tablet Take 1 tablet (500 mg total) by mouth every 12 (twelve) hours. (Patient not taking: Reported on 04/01/2022) 60 tablet 1   gabapentin (NEURONTIN) 300 MG capsule Take 1 capsule (300 mg total) by mouth 3 (three) times daily. (Patient not taking: Reported on 04/01/2022) 90 capsule 1   haloperidol (HALDOL) 2 MG tablet Take 2 mg by mouth 2 (two) times daily. (Patient not taking: Reported on 09/27/2022)     hydrOXYzine (ATARAX) 50 MG tablet Take 1 tablet (50 mg total) by mouth every 4 (four) hours as needed for anxiety. (Patient not taking: Reported on 09/27/2022) 30 tablet 1   OLANZapine (ZYPREXA) 5 MG tablet Take 1 tablet (5 mg total) by  mouth at bedtime. (Patient not taking: Reported on 09/27/2022) 30 tablet WARM   pantoprazole (PROTONIX) 40 MG tablet Take 2 tablets (80 mg total) by mouth daily. (Patient not taking: Reported on 09/27/2022) 60 tablet 1   traZODone (DESYREL) 100 MG tablet Take 1 tablet (100 mg total) by mouth at bedtime  as needed for sleep. (Patient not taking: Reported on 04/01/2022) 30 tablet 1    Musculoskeletal: Strength & Muscle Tone: within normal limits Gait & Station: normal Patient leans: N/A  Psychiatric Specialty Exam: Physical Exam Vitals and nursing note reviewed.  Constitutional:      Appearance: He is well-developed.  HENT:     Head: Normocephalic.  Pulmonary:     Effort: Pulmonary effort is normal.  Musculoskeletal:        General: Normal range of motion.     Cervical back: Normal range of motion.  Neurological:     Mental Status: He is alert.  Psychiatric:        Attention and Perception: Attention and perception normal.        Mood and Affect: Mood is anxious and depressed. Affect is blunt.        Speech: Speech normal.        Behavior: Behavior normal. Behavior is cooperative.        Thought Content: Thought content includes suicidal ideation.        Cognition and Memory: Cognition and memory normal.        Judgment: Judgment normal.     Review of Systems  Psychiatric/Behavioral:  Positive for depression, substance abuse and suicidal ideas. The patient is nervous/anxious.   All other systems reviewed and are negative.   Blood pressure (!) 180/99, pulse (!) 108, temperature 98 F (36.7 C), temperature source Oral, resp. rate 18, height 5\' 11"  (1.803 m), weight 104.3 kg, SpO2 99 %.Body mass index is 32.07 kg/m.  General Appearance: Casual  Eye Contact:  Good  Speech:  Normal Rate  Volume:  Normal  Mood:  Anxious, depressed  Affect:  Congruent  Thought Process:  Coherent and Descriptions of Associations: Intact  Orientation:  Full (Time, Place, and Person)  Thought Content:   WDL and Logical  Suicidal Thoughts:  Yes with plan  Homicidal Thoughts:  No  Memory:  Immediate;   Good Recent;   Good Remote;   Good  Judgement:  Fair  Insight:  Fair  Psychomotor Activity:  Normal  Concentration:  Concentration: Fair and Attention Span: Fair  Recall:  of Knowledge:  Fair  Language:  Good  Akathisia:  No  Handed:  Right  AIMS (if indicated):     Assets:  Leisure Time Physical Health Resilience Social Support  ADL's:  Intact  Cognition:  WNL  Sleep:        Physical Exam: Physical Exam Vitals and nursing note reviewed.  Constitutional:      Appearance: He is well-developed.  HENT:     Head: Normocephalic.  Pulmonary:     Effort: Pulmonary effort is normal.  Musculoskeletal:        General: Normal range of motion.     Cervical back: Normal range of motion.  Neurological:     Mental Status: He is alert.  Psychiatric:        Attention and Perception: Attention and perception normal.        Mood and Affect: Mood is anxious and depressed. Affect is blunt.        Speech: Speech normal.        Behavior: Behavior normal. Behavior is cooperative.        Thought Content: Thought content includes suicidal ideation.        Cognition and Memory: Cognition and memory normal.        Judgment: Judgment normal.    Review  of Systems  Psychiatric/Behavioral:  Positive for depression, substance abuse and suicidal ideas. The patient is nervous/anxious.   All other systems reviewed and are negative.  Blood pressure (!) 180/99, pulse (!) 108, temperature 98 F (36.7 C), temperature source Oral, resp. rate 18, height 5\' 11"  (1.803 m), weight 104.3 kg, SpO2 99 %. Body mass index is 32.07 kg/m.  Treatment Plan Summary: Polysubstance abuse: Gabap[facility]  Anxiety: Hydroxyzine 50 mg every six hours PRN  Insomnia: Trazodone 100 mg at bedtime PRN  Disposition: Work on obtaining a bed at the [facility], NP 10/11/2022  4:31 PM

## 2022-10-11 NOTE — ED Notes (Signed)
Report to include Situation, Background, Assessment, and Recommendations received from [employer]. Patient alert and oriented, warm and dry, in no acute distress. Patient denies SI and HI. He reports AVH. Patient made aware of Q15 minute rounds and security cameras for their safety. Patient instructed to come to me with needs or concerns.

## 2022-10-11 NOTE — ED Triage Notes (Signed)
Presents via EMS from home  States he was involved in a verbal altercation this am  then developed some chest discomfort  Was given ASA 324 PTA

## 2022-10-11 NOTE — ED Notes (Signed)
Patient asking if he is safe different times. Staff reassure and redirect patient. Patient is now out in a millieu pacing. He said " I just want to get out of my room. It is overwhelming."

## 2022-10-11 NOTE — ED Notes (Signed)
Pt given supplies to wash his feet.

## 2022-10-11 NOTE — BH Assessment (Signed)
Comprehensive Clinical Assessment (CCA) Note  10/11/2022 Jeremy Frank [id number]  Chief Complaint: Patient is a 36 year old male presenting to [facility] ED under IVC. Per triage note Presents via EMS from home  States he was involved in a verbal altercation this am  then developed some chest discomfort. During assessment patient appears alert and oriented x4, cooperative but somewhat anxious. Patient reports "I didn't get my I.d. and I got stranded, I felt overwhelmed and I tried to call my family to help and get me to the [place], I asked my friend for his phone to call my family but he went off on me." Patient reports AH "I want the voices to stop, they tell me different things, messages that are not there." Patient also has a long history of substance use and PTSD due to his [place] history.   Per Psyc NP Theodoro Clock patient to be referred to the [facility] Chief Complaint  Patient presents with   Chest Pain   Visit Diagnosis: Cocaine abuse with cocaine-induced mood disorder, PTSD   CCA Screening, Triage and Referral (STR)  Patient Reported Information How did you hear about [place]? Self  Referral name: No data recorded Referral phone number: No data recorded  Whom do you see for routine medical problems? No data recorded Practice/Facility Name: No data recorded Practice/Facility Phone Number: No data recorded Name of Contact: No data recorded Contact Number: No data recorded Contact Fax Number: No data recorded Prescriber Name: No data recorded Prescriber Address (if known): No data recorded  What Is the Reason for Your Visit/Call Today? Presents via EMS from home  States he was involved in a verbal altercation this am  then developed some chest discomfort  How Long Has This Been Causing You Problems? > than 6 months  What Do You Feel Would Help You the Most Today? Treatment for Depression or other mood problem; Housing Assistance   Have You Recently Been in Any Inpatient Treatment  (Hospital/Detox/Crisis Center/28-Day Program)? No data recorded Name/Location of Program/Hospital:No data recorded How Long Were You There? No data recorded When Were You Discharged? No data recorded  Have You Ever Received Services From [facility] Before? No data recorded Who Do You See at [facility]? No data recorded  Have You Recently Had Any Thoughts About Hurting Yourself? No  Are You Planning to Commit Suicide/Harm Yourself At This time? No   Have you Recently Had Thoughts About [place]? No  Explanation: No data recorded  Have You Used Any Alcohol or Drugs in the Past 24 Hours? Yes  How Long Ago Did You Use Drugs or Alcohol? No data recorded What Did You Use and How Much? Alcohol   Do You Currently Have a Therapist/Psychiatrist? No  Name of Therapist/Psychiatrist: [place] Recently Discharged From Any Mudlogger or Programs? No  Explanation of Discharge From Practice/Program: No data recorded    CCA Screening Triage Referral Assessment Type of Contact: Face-to-Face  Is this Initial or Reassessment? No data recorded Date Telepsych consult ordered in [facility]:  No data recorded Time Telepsych consult ordered in [facility]:  No data recorded  Patient Reported Information Reviewed? No data recorded Patient Left Without Being Seen? No data recorded Reason for Not Completing Assessment: No data recorded  Collateral Involvement: None provided   Does Patient Have a [place]? No data recorded Name and Contact of Legal Guardian: No data recorded If Minor and Not Living with Parent(s), Who has  Custody? N/A  Is CPS involved or ever been involved? Never  Is APS involved or ever been involved? Never   Patient Determined To Be At Risk for Harm To Self or Others Based on Review of Patient Reported Information or Presenting Complaint? No  Method: No data recorded Availability of Means: No data recorded Intent: No data  recorded Notification Required: No data recorded Additional Information for Danger to Others Potential: No data recorded Additional Comments for Danger to Others Potential: No data recorded Are There Guns or Other Weapons in Your Home? No data recorded Types of Guns/Weapons: No data recorded Are These Weapons Safely Secured?                            No data recorded Who Could Verify You Are Able To Have These Secured: No data recorded Do You Have any Outstanding Charges, Pending Court Dates, Parole/Probation? No data recorded Contacted To Inform of Risk of Harm To Self or Others: Other: Comment   Location of Assessment: [facility] ED   Does Patient Present under Involuntary Commitment? Yes  IVC Papers Initial File Date: 10/11/22   [place] of Residence: [place]   Patient Currently Receiving the Following Services: Medication Management   Determination of Need: Emergent (2 hours)   Options For Referral: Therapeutic Triage Services; ED Visit     CCA Biopsychosocial Intake/Chief Complaint:  No data recorded Current Symptoms/Problems: No data recorded  Patient Reported Schizophrenia/Schizoaffective Diagnosis in Past: No   Strengths: pt is able to ask for help/motivated for change  Preferences: No data recorded Abilities: No data recorded  Type of Services Patient Feels are Needed: No data recorded  Initial Clinical Notes/Concerns: No data recorded  Mental Health Symptoms Depression:   Change in energy/activity; Difficulty Concentrating; Fatigue; Hopelessness   Duration of Depressive symptoms:  Greater than two weeks   Mania:   N/A   Anxiety:    Restlessness; Worrying; Tension; Difficulty concentrating; Fatigue; Sleep   Psychosis:   Hallucinations   Duration of Psychotic symptoms:  Greater than six months   Trauma:   Hypervigilance; Difficulty staying/falling asleep; Avoids reminders of event   Obsessions:   N/A   Compulsions:   N/A   Inattention:    N/A   Hyperactivity/Impulsivity:   N/A   Oppositional/Defiant Behaviors:   None   Emotional Irregularity:   Potentially harmful impulsivity   Other Mood/Personality Symptoms:  No data recorded   Mental Status Exam Appearance and self-care  Stature:   Average   Weight:   Average weight   Clothing:   Casual (In scrubs)   Grooming:   Normal   Cosmetic use:   None   Posture/gait:   Normal   Motor activity:   Restless   Sensorium  Attention:   Distractible   Concentration:   Anxiety interferes; Scattered   Orientation:   Person; Place; Object; Situation; X5; Time   Recall/memory:   Normal   Affect and Mood  Affect:   Anxious; Congruent   Mood:   Anxious; Depressed   Relating  Eye contact:   Fleeting   Facial expression:   Anxious   Attitude toward examiner:   Cooperative   Thought and Language  Speech flow:  Clear and Coherent   Thought content:   Appropriate to Mood and Circumstances   Preoccupation:   None   Hallucinations:   None   Organization:  No data recorded  [employer] of  Knowledge:   Fair   Intelligence:   Average   Abstraction:   Functional   Judgement:   Fair   Reality Testing:   Adequate   Insight:   Present   Decision Making:   Vacilates   Social Functioning  Social Maturity:   Irresponsible; Impulsive   Social Judgement:   "Games developer"; Heedless   Stress  Stressors:   Housing; Teacher, music Ability:   Advice worker Deficits:   None   Supports:   Support needed     Religion: Religion/Spirituality Are You A Religious Person?: No How Might This Affect Treatment?: n/a  Leisure/Recreation: Leisure / Recreation Do You Have Hobbies?: No  Exercise/Diet: Exercise/Diet Do You Exercise?: No Have You Gained or Lost A Significant Amount of Weight in the Past Six Months?: No Do You Follow a Special Diet?: No Do You Have Any Trouble Sleeping?:  Yes Explanation of Sleeping Difficulties: Patient reports poor sleep   CCA Employment/Education Employment/Work Situation: Employment / Work Situation Employment Situation: On disability Why is Patient on Disability: Medical leave of absence from the [place] for mental health How Long has Patient Been on Disability: Pt stated he has been out of [employer], about 2008 Patient's Job has Been Impacted by Current Illness: No Has Patient ever Been in the [employer]?: Yes (Describe in comment) Did You Receive Any Psychiatric Treatment/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?: No  Education: Education Is Patient Currently Attending School?: No Last Grade Completed: 12 Did You Attend College?: No Did You Have An Individualized Education Program (IIEP): No Did You Have Any Difficulty At School?: No Patient's Education Has Been Impacted by Current Illness: No   CCA Family/Childhood History Family and Relationship History: Family history Marital status: Single Does patient have children?: Yes How many children?: 1 How is patient's relationship with their children?: Distant  Childhood History:  Childhood History By whom was/is the patient raised?: Mother Did patient suffer any verbal/emotional/physical/sexual abuse as a child?: No Did patient suffer from severe childhood neglect?: No Has patient ever been sexually abused/assaulted/raped as an adolescent or adult?: No Was the patient ever a victim of a crime or a disaster?: No Witnessed domestic violence?: No Has patient been affected by domestic violence as an adult?: No  Child/Adolescent Assessment:     CCA Substance Use Alcohol/Drug Use: Alcohol / Drug Use Pain Medications: See PTA Prescriptions: See PTA Over the Counter: See PTA History of alcohol / drug use?: Yes Longest period of sobriety (when/how long): Unable to quantify Negative Consequences of Use: Personal relationships Withdrawal Symptoms: Tremors, Change in blood  pressure, Sweats Substance #1 Name of Substance 1: Alcohol 1 - Amount (size/oz): Unknown amounts 1 - Frequency: daily 1 - Last Use / Amount: 10/10/22                       ASAM's:  Six Dimensions of Multidimensional Assessment  Dimension 1:  Acute Intoxication and/or Withdrawal Potential:   Dimension 1:  Description of individual's past and current experiences of substance use and withdrawal: Pt has a chronic hx of polysubstance abuse  Dimension 2:  Biomedical Conditions and Complications:      Dimension 3:  Emotional, Behavioral, or Cognitive Conditions and Complications:     Dimension 4:  Readiness to Change:     Dimension 5:  Relapse, Continued use, or Continued Problem Potential:     Dimension 6:  Recovery/Living Environment:     ASAM Severity Score: ASAM's  Severity Rating Score: 16  ASAM Recommended Level of Treatment: ASAM Recommended Level of Treatment: Level II Partial Hospitalization Treatment   Substance use Disorder (SUD) Substance Use Disorder (SUD)  Checklist Symptoms of Substance Use: Continued use despite having a persistent/recurrent physical/psychological problem caused/exacerbated by use, Continued use despite persistent or recurrent social, interpersonal problems, caused or exacerbated by use, Presence of craving or strong urge to use, Persistent desire or unsuccessful efforts to cut down or control use, Social, occupational, recreational activities given up or reduced due to use, Recurrent use that results in a failure to fulfill major role obligations (work, school, home), Substance(s) often taken in larger amounts or over longer times than was intended  Recommendations for Services/Supports/Treatments: Recommendations for Services/Supports/Treatments Recommendations For Services/Supports/Treatments: SAIOP (Substance Abuse Intensive Outpatient Program), Medication Management, Detox, Individual Therapy, IOP (Intensive Outpatient Program)  DSM5  Diagnoses: Patient Active Problem List   Diagnosis Date Noted   Cocaine abuse with cocaine-induced mood disorder ([place]) 10/11/2022   Chest pain 02/10/2022   Polysubstance abuse ([place]) 02/10/2022   Depression 02/10/2022   Alcohol abuse 02/10/2022   Tobacco abuse 02/10/2022   HTN (hypertension) 02/10/2022   Lung nodules 02/10/2022   Elevated troponin 02/10/2022   GERD (gastroesophageal reflux disease) 02/10/2022   Chronic kidney disease, stage 3a ([place]) 02/10/2022   Migraine 02/10/2022   Cannabis dependence, continuous ([place]) 06/18/2021   Alcohol abuse with alcohol-induced mood disorder ([place]) 06/18/2021   Crohn's disease ([place]) 06/18/2021   Peptic ulcer 06/18/2021   Severe alcohol dependence ([place]) 06/18/2021   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[place])    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   Hypertension 10/19/2020   Psychosis ([place]) 06/13/2015   Posttraumatic stress disorder 06/13/2015   Traumatic brain injury ([place]) 06/13/2015   Cocaine dependence ([place]) 06/13/2015    Patient Centered Plan: Patient is on the following Treatment Plan(s):  Post Traumatic Stress Disorder and Substance Abuse   Referrals to Alternative Service(s): Referred to Alternative Service(s):   Place:   Date:   Time:    Referred to Alternative Service(s):   Place:   Date:   Time:    Referred to Alternative Service(s):   Place:   Date:   Time:    Referred to Alternative Service(s):   Place:   Date:   Time:      @BHCOLLABOFCARE @  [employer], LCAS-A

## 2022-10-11 NOTE — ED Notes (Addendum)
Pt moved from [place] ed to [place] 1. Pt level of acuity was a 3, this RN changed to match condition at level 2 as pt is IVC pt at this time. Pt is alert and is stating "I am safe right?" Pt was escorted to his room. Given blanket and remote and water. Pt was dressed out in flex by Les prior to walking over.  Khaki shorts Cytogeneticist socks Black underwear hat

## 2022-10-11 NOTE — BH Assessment (Signed)
Patient referred to [facility] [phone])

## 2022-10-12 LAB — URINALYSIS, ROUTINE W REFLEX MICROSCOPIC
Bacteria, UA: NONE SEEN
Bilirubin Urine: NEGATIVE
Glucose, UA: NEGATIVE mg/dL
Hgb urine dipstick: NEGATIVE
Ketones, ur: NEGATIVE mg/dL
Leukocytes,Ua: NEGATIVE
Nitrite: NEGATIVE
Protein, ur: 30 mg/dL — AB
Specific Gravity, Urine: 1.018 (ref 1.005–1.030)
pH: 5 (ref 5.0–8.0)

## 2022-10-12 LAB — URINE DRUG SCREEN, QUALITATIVE (ARMC ONLY)
Amphetamines, Ur Screen: POSITIVE — AB
Barbiturates, Ur Screen: NOT DETECTED
Benzodiazepine, Ur Scrn: NOT DETECTED
Cannabinoid 50 Ng, Ur ~~LOC~~: POSITIVE — AB
Cocaine Metabolite,Ur ~~LOC~~: POSITIVE — AB
MDMA (Ecstasy)Ur Screen: NOT DETECTED
Methadone Scn, Ur: NOT DETECTED
Opiate, Ur Screen: NOT DETECTED
Phencyclidine (PCP) Ur S: NOT DETECTED
Tricyclic, Ur Screen: NOT DETECTED

## 2022-10-12 MED ORDER — HYDROCHLOROTHIAZIDE 25 MG PO TABS
25.0000 mg | ORAL_TABLET | ORAL | Status: AC
  Administered 2022-10-12: 25 mg via ORAL
  Filled 2022-10-12: qty 1

## 2022-10-12 NOTE — ED Notes (Signed)
RN spoke with Jeremy Frank at [facility] for transfer.

## 2022-10-12 NOTE — ED Notes (Addendum)
EMTALA Reviewed by this RN.  

## 2022-10-12 NOTE — ED Notes (Signed)
Patient didn't want to take his medication. This Probation officer encouraged the patient to take his scheduled HS medication. He refused his PRN medications. No issues.

## 2022-10-12 NOTE — BH Assessment (Signed)
TTS completed and [facility]ed [facility] to [facility] (Fax: [phone])  - Pending acceptance to [facility] inpatient

## 2022-10-12 NOTE — BH Assessment (Addendum)
Patient has been accepted to [facility].  Patient assigned to Emergency Department [facility]) Accepting physician is Dr. Marita Kansas.  Call report to [phone] ext. [phone] Representative was Resident - Gerald Stabs / AOD - Denyse Amass.   ER Staff is aware of it:  Ronnie, ER Secretary  Dr. Kerman Passey, ER MD  Donella Stade, Patient's Nurse       Address: [facility] [address] [place], [place] [address]    **Patient must arrive by 6:00pm today or pt will be unable to admit today.

## 2022-10-12 NOTE — Consult Note (Signed)
[facility] Face-to-Face Psychiatry Consult   Reason for Consult:  substance abuse with suicidal ideations Referring Physician:  EDP Patient Identification: Jeremy Frank MRN:  [id number] Principal Diagnosis: Cocaine abuse with cocaine-induced mood disorder (HCC) Diagnosis:  Principal Problem:   Cocaine abuse with cocaine-induced mood disorder (HCC)   Total Time spent with patient: 45 minutes  Subjective:   Jeremy Frank is a 36 y.o. male patient admitted with polysubstance abuse with suicidal ideations.  Client continues to feel unsafe outside of the hospital.  He appears to feel unsafe related to the drug world on the streets per his descriptions.  No safety issues in the hospital.  The [facility] has accepted him and he is transferring this afternoon.  This is what the client desires to stabilize and obtain housing.  HPI on admission:  Client presented to the ED with chest pain after using cocaine and meth.  He is calm but suicidal, cannot contract for safety.  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  Now, he wants to return as he is tired of living on the streets and using drugs. His mother is willing to put him on the train to the [place] but he is not safe. No homicidal ideations, hallucinations, or withdrawal symptoms.  Work on transfer to the [place].  Past Psychiatric History: polysubstance abuse, PTSD, depression, anxiety  Risk to Self:  none Risk to Others:  none Prior Inpatient Therapy:  multiple [facility]  Past Medical History:  Past Medical History:  Diagnosis Date   Drug dependence (HCC)    GI bleed    Hiatal hernia    Hypertension    Migraine    PTSD (post-traumatic stress disorder)     Past Surgical History:  Procedure Laterality Date   KNEE SURGERY     Family History:  Family History  Problem Relation Age of Onset   Heart failure Mother    Hypertension Father    Family  Psychiatric  History: none Social History:  Social History   Substance and Sexual Activity  Alcohol Use Yes   Alcohol/week: 42.0 standard drinks of alcohol   Types: 42 Cans of beer per week   Comment: per pt he drinks 6-12 cans of beer daily     Social History   Substance and Sexual Activity  Drug Use Yes   Types: Cocaine, Marijuana, Amphetamines    Social History   Socioeconomic History   Marital status: Single    Spouse name: Not on file   Number of children: Not on file   Years of education: Not on file   Highest education level: Not on file  Occupational History   Not on file  Tobacco Use   Smoking status: Every Day    Packs/day: 0.25    Types: Cigarettes   Smokeless tobacco: Never  Vaping Use   Vaping Use: Never used  Substance and Sexual Activity   Alcohol use: Yes    Alcohol/week: 42.0 standard drinks of alcohol    Types: 42 Cans of beer per week    Comment: per pt he drinks 6-12 cans of beer daily   Drug use: Yes    Types: Cocaine, Marijuana, Amphetamines   Sexual activity: Not Currently  Other Topics Concern   Not on file  Social History Narrative   Not on file   Social Determinants of Health   Financial Resource Strain:  Not on file  Food Insecurity: Not on file  Transportation Needs: Not on file  Physical Activity: Not on file  Stress: Not on file  Social Connections: Not on file   Additional Social History:    Allergies:   Allergies  Allergen Reactions   Peanut-Containing Drug Products Diarrhea, Anaphylaxis and Swelling   Cheese Diarrhea   Eggs Or Egg-Derived Products Diarrhea    Labs:  Results for orders placed or performed during the hospital encounter of 10/11/22 (from the past 48 hour(s))  CBC     Status: Abnormal   Collection Time: 10/11/22  1:48 PM  Result Value Ref Range   WBC 13.2 (H) 4.0 - 10.5 K/uL   RBC 5.11 4.22 - 5.81 MIL/uL   Hemoglobin 15.0 13.0 - 17.0 g/dL   HCT 16.1 09.6 - 04.5 %   MCV 86.9 80.0 - 100.0 fL   MCH  29.4 26.0 - 34.0 pg   MCHC 33.8 30.0 - 36.0 g/dL   RDW 40.9 81.1 - 91.4 %   Platelets 337 150 - 400 K/uL   nRBC 0.0 0.0 - 0.2 %    Comment: Performed at [facility], [address], [place], [place] [address]  Basic metabolic panel     Status: Abnormal   Collection Time: 10/11/22  1:48 PM  Result Value Ref Range   Sodium 138 135 - 145 mmol/L   Potassium 3.4 (L) 3.5 - 5.1 mmol/L   Chloride 106 98 - 111 mmol/L   CO2 21 (L) 22 - 32 mmol/L   Glucose, Bld 97 70 - 99 mg/dL    Comment: Glucose reference range applies only to samples taken after fasting for at least 8 hours.   BUN 15 6 - 20 mg/dL   Creatinine, Ser 6.21 (H) 0.61 - 1.24 mg/dL   Calcium 9.8 8.9 - 30.8 mg/dL   GFR, Estimated 59 (L) >60 mL/min    Comment: (NOTE) Calculated using the CKD-EPI Creatinine Equation (2021)    Anion gap 11 5 - 15    Comment: Performed at [facility], [address] [place], [place] [address]  Troponin I (High Sensitivity)     Status: None   Collection Time: 10/11/22  1:48 PM  Result Value Ref Range   Troponin I (High Sensitivity) 8 <18 ng/L    Comment: (NOTE) Elevated high sensitivity troponin I (hsTnI) values and significant  changes across serial measurements may suggest ACS but many other  chronic and acute conditions are known to elevate hsTnI results.  Refer to the "Links" section for chest pain algorithms and additional  guidance. Performed at [facility], [address] [place], [place] [address]   Resp Panel by RT-PCR (Flu A&B, Covid) Anterior Nasal Swab     Status: None   Collection Time: 10/11/22  7:28 PM   Specimen: Anterior Nasal Swab  Result Value Ref Range   SARS Coronavirus 2 by RT PCR NEGATIVE NEGATIVE    Comment: (NOTE) SARS-CoV-2 target nucleic acids are NOT DETECTED.  The SARS-CoV-2 RNA is generally detectable in upper respiratory specimens during the acute phase of infection. The lowest concentration of SARS-CoV-2 viral copies this  assay can detect is 138 copies/mL. A negative result does not preclude SARS-Cov-2 infection and should not be used as the sole basis for treatment or other patient management decisions. A negative result may occur with  improper specimen collection/handling, submission of specimen other than nasopharyngeal swab, presence of viral mutation(s) within the areas targeted by  this assay, and inadequate number of viral copies(<138 copies/mL). A negative result must be combined with clinical observations, patient history, and epidemiological information. The expected result is Negative.  Fact Sheet for Patients:  EntrepreneurPulse.com.au  Fact Sheet for Healthcare Providers:  IncredibleEmployment.be  This test is no t yet approved or cleared by the [place] FDA and  has been authorized for detection and/or diagnosis of SARS-CoV-2 by FDA under an Emergency Use Authorization (EUA). This EUA will remain  in effect (meaning this test can be used) for the duration of the COVID-19 declaration under Section 564(b)(1) of the Act, 21 U.S.C.section 360bbb-3(b)(1), unless the authorization is terminated  or revoked sooner.       Influenza A by PCR NEGATIVE NEGATIVE   Influenza B by PCR NEGATIVE NEGATIVE    Comment: (NOTE) The Xpert Xpress SARS-CoV-2/FLU/RSV plus assay is intended as an aid in the diagnosis of influenza from Nasopharyngeal swab specimens and should not be used as a sole basis for treatment. Nasal washings and aspirates are unacceptable for Xpert Xpress SARS-CoV-2/FLU/RSV testing.  Fact Sheet for Patients: EntrepreneurPulse.com.au  Fact Sheet for Healthcare Providers: IncredibleEmployment.be  This test is not yet approved or cleared by the [place] FDA and has been authorized for detection and/or diagnosis of SARS-CoV-2 by FDA under an Emergency Use Authorization (EUA). This EUA will remain in  effect (meaning this test can be used) for the duration of the COVID-19 declaration under Section 564(b)(1) of the Act, 21 U.S.C. section 360bbb-3(b)(1), unless the authorization is terminated or revoked.  Performed at [facility], [address] [place], [place] [address]     Current Facility-Administered Medications  Medication Dose Route Frequency Provider Last Rate Last Admin   cloNIDine (CATAPRES) tablet 0.1 mg  0.1 mg Oral Daily Patrecia Pour, NP   0.1 mg at 10/12/22 0905   divalproex (DEPAKOTE) DR tablet 500 mg  500 mg Oral Q12H Patrecia Pour, NP   500 mg at 10/12/22 6213   gabapentin (NEURONTIN) capsule 300 mg  300 mg Oral TID Patrecia Pour, NP   300 mg at 10/12/22 0905   hydrOXYzine (ATARAX) tablet 50 mg  50 mg Oral Q4H PRN Patrecia Pour, NP   50 mg at 10/11/22 1844   pantoprazole (PROTONIX) EC tablet 80 mg  80 mg Oral Daily Patrecia Pour, NP   80 mg at 10/12/22 0905   terbinafine (LAMISIL) 1 % cream   Topical BID Merlyn Lot, MD       traZODone (DESYREL) tablet 100 mg  100 mg Oral QHS PRN Patrecia Pour, NP       Current Outpatient Medications  Medication Sig Dispense Refill   divalproex (DEPAKOTE) 500 MG DR tablet Take 1 tablet (500 mg total) by mouth every 12 (twelve) hours. (Patient not taking: Reported on 04/01/2022) 60 tablet 1   gabapentin (NEURONTIN) 300 MG capsule Take 1 capsule (300 mg total) by mouth 3 (three) times daily. (Patient not taking: Reported on 04/01/2022) 90 capsule 1   haloperidol (HALDOL) 2 MG tablet Take 2 mg by mouth 2 (two) times daily. (Patient not taking: Reported on 09/27/2022)     hydrOXYzine (ATARAX) 50 MG tablet Take 1 tablet (50 mg total) by mouth every 4 (four) hours as needed for anxiety. (Patient not taking: Reported on 09/27/2022) 30 tablet 1   OLANZapine (ZYPREXA) 5 MG tablet Take 1 tablet (5 mg total) by mouth at bedtime. (Patient not taking: Reported on 09/27/2022) 30 tablet WARM  pantoprazole (PROTONIX) 40 MG tablet  Take 2 tablets (80 mg total) by mouth daily. (Patient not taking: Reported on 09/27/2022) 60 tablet 1   traZODone (DESYREL) 100 MG tablet Take 1 tablet (100 mg total) by mouth at bedtime as needed for sleep. (Patient not taking: Reported on 04/01/2022) 30 tablet 1    Musculoskeletal: Strength & Muscle Tone: within normal limits Gait & Station: normal Patient leans: N/A  Psychiatric Specialty Exam: Physical Exam Vitals and nursing note reviewed.  Constitutional:      Appearance: He is well-developed.  HENT:     Head: Normocephalic.  Pulmonary:     Effort: Pulmonary effort is normal.  Musculoskeletal:        General: Normal range of motion.     Cervical back: Normal range of motion.  Neurological:     Mental Status: He is alert.  Psychiatric:        Attention and Perception: Attention and perception normal.        Mood and Affect: Mood is anxious and depressed. Affect is blunt.        Speech: Speech normal.        Behavior: Behavior normal. Behavior is cooperative.        Thought Content: Thought content includes suicidal ideation.        Cognition and Memory: Cognition and memory normal.        Judgment: Judgment normal.     Review of Systems  Psychiatric/Behavioral:  Positive for depression, substance abuse and suicidal ideas. The patient is nervous/anxious.   All other systems reviewed and are negative.   Blood pressure (!) 140/105, pulse 85, temperature 98.1 F (36.7 C), resp. rate 16, height 5\' 11"  (1.803 m), weight 104.3 kg, SpO2 98 %.Body mass index is 32.07 kg/m.  General Appearance: Casual  Eye Contact:  Good  Speech:  Normal Rate  Volume:  Normal  Mood:  Anxious, depressed  Affect:  Congruent  Thought Process:  Coherent and Descriptions of Associations: Intact  Orientation:  Full (Time, Place, and Person)  Thought Content:  WDL and Logical  Suicidal Thoughts:  Yes with plan  Homicidal Thoughts:  No  Memory:  Immediate;   Good Recent;   Good Remote;   Good   Judgement:  Fair  Insight:  Fair  Psychomotor Activity:  Normal  Concentration:  Concentration: Fair and Attention Span: Fair  Recall:  of Knowledge:  Fair  Language:  Good  Akathisia:  No  Handed:  Right  AIMS (if indicated):     Assets:  Leisure Time Physical Health Resilience Social Support  ADL's:  Intact  Cognition:  WNL  Sleep:        Physical Exam: Physical Exam Vitals and nursing note reviewed.  Constitutional:      Appearance: He is well-developed.  HENT:     Head: Normocephalic.  Pulmonary:     Effort: Pulmonary effort is normal.  Musculoskeletal:        General: Normal range of motion.     Cervical back: Normal range of motion.  Neurological:     Mental Status: He is alert.  Psychiatric:        Attention and Perception: Attention and perception normal.        Mood and Affect: Mood is anxious and depressed. Affect is blunt.        Speech: Speech normal.        Behavior: Behavior normal. Behavior is cooperative.  Thought Content: Thought content includes suicidal ideation.        Cognition and Memory: Cognition and memory normal.        Judgment: Judgment normal.    Review of Systems  Psychiatric/Behavioral:  Positive for depression, substance abuse and suicidal ideas. The patient is nervous/anxious.   All other systems reviewed and are negative.  Blood pressure (!) 140/105, pulse 85, temperature 98.1 F (36.7 C), resp. rate 16, height 5\' 11"  (1.803 m), weight 104.3 kg, SpO2 98 %. Body mass index is 32.07 kg/m.  Treatment Plan Summary: Polysubstance abuse: Gabapentin 300 mg TID [facility]  Anxiety: Hydroxyzine 50 mg every six hours PRN  Insomnia: Trazodone 100 mg at bedtime PRN  Disposition: Transfer to the  [place], NP 10/12/2022 12:06 PM

## 2022-10-12 NOTE — ED Notes (Signed)
Pt has been accepted at [facility]. No signature pad available for consent. Verbal consent given due to IVC patient.

## 2022-10-27 IMAGING — CT CT ANGIO CHEST
2 of 7 series · 18 of 46 positions shown · IV contrast (APPLIED)
Comparison: Chest PA Lat earlier today, portable chest 08/30/2017

CLINICAL DATA: Anxiety and chest pain.

EXAM:
CT ANGIOGRAPHY CHEST WITH CONTRAST
TECHNIQUE: Multidetector CT imaging of the chest was performed using the
standard protocol during bolus administration of intravenous
contrast. Multiplanar CT image reconstructions and MIPs were
obtained to evaluate the vascular anatomy.

[Series 5: thins · axial · 0.80mm/px · z∈[-626,-323]mm · 15 of 418 slices shown]
[im 20/418  lung]
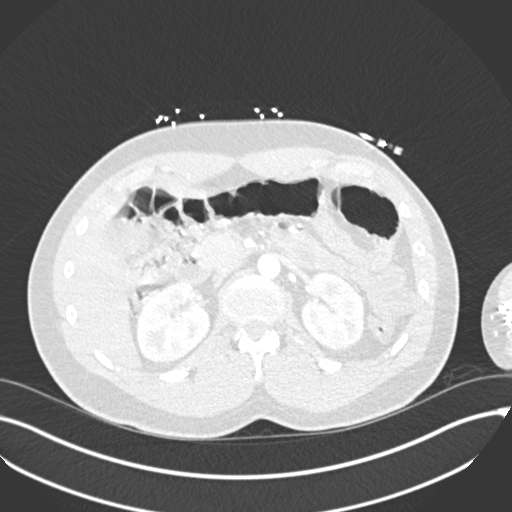
[im 60/418  soft-tissue]
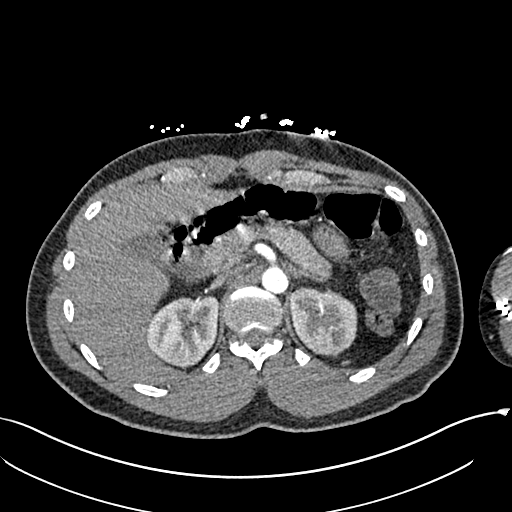
[im 80/418  lung]
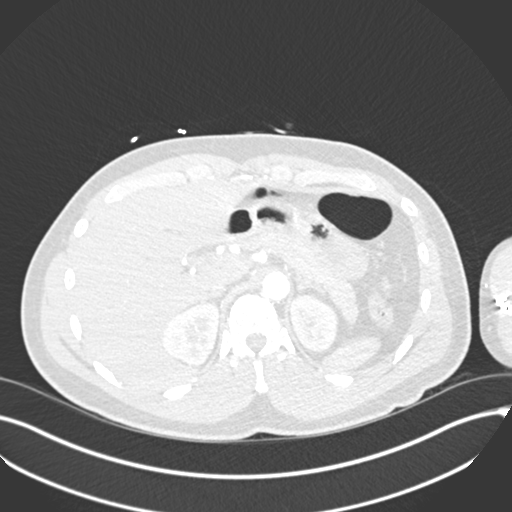
[im 100/418  soft-tissue]
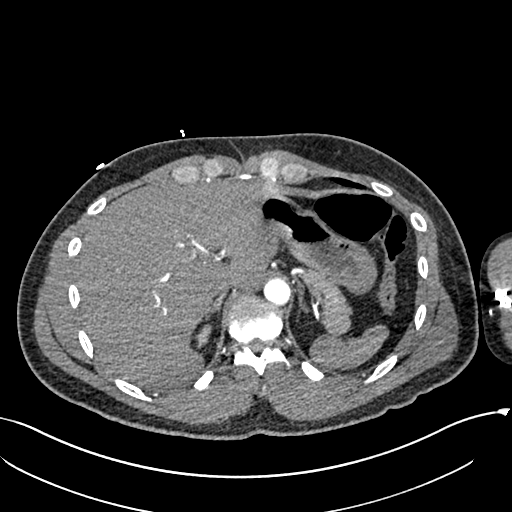
[im 140/418  lung]
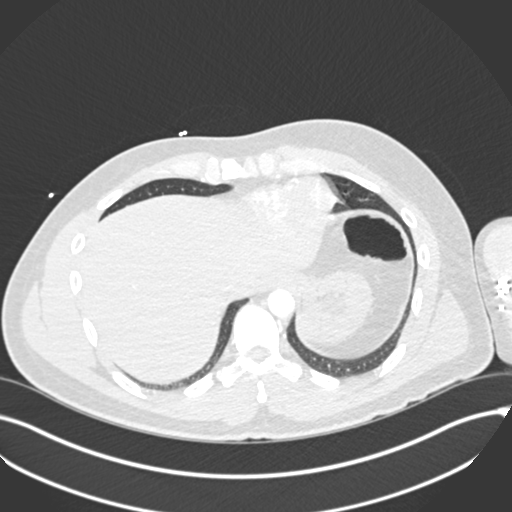
[im 159/418  soft-tissue]
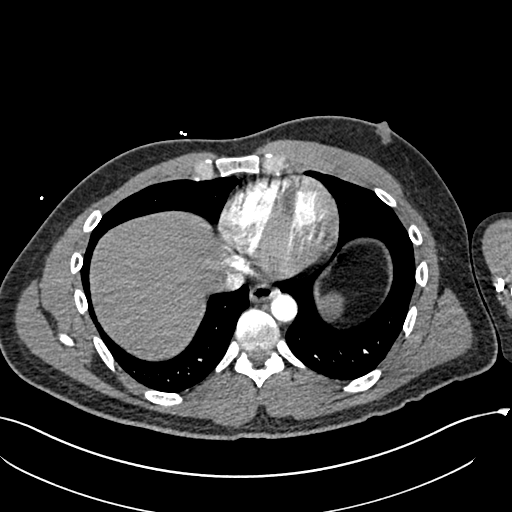
[im 179/418  lung]
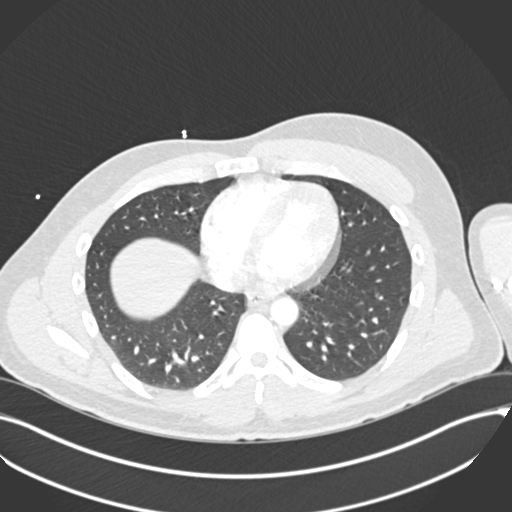
[im 219/418  soft-tissue]
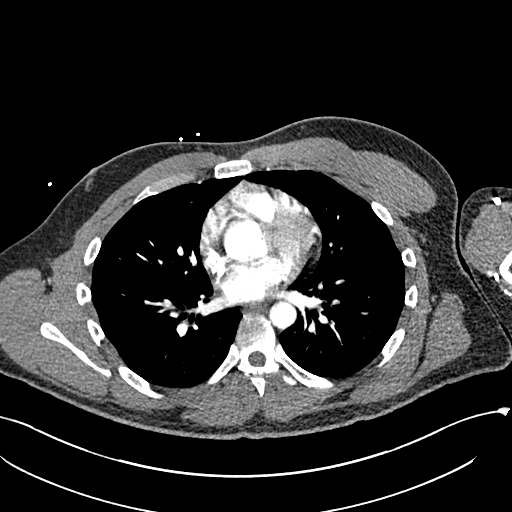
[im 239/418  lung]
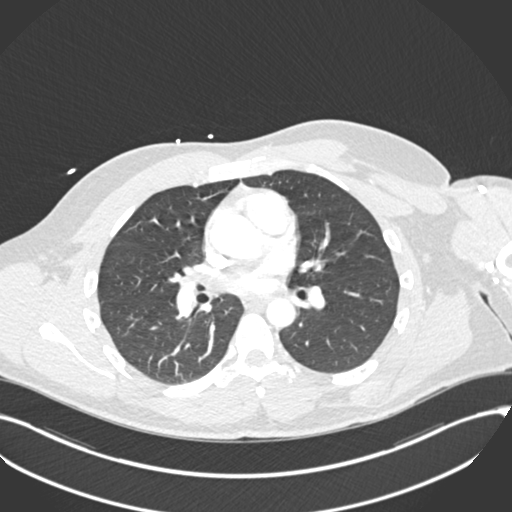
[im 259/418  soft-tissue]
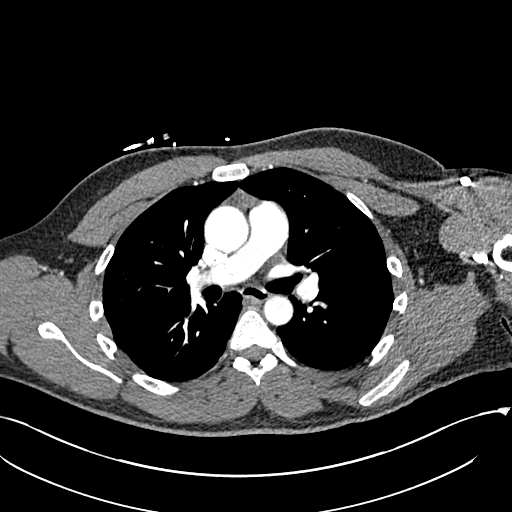
[im 298/418  lung]
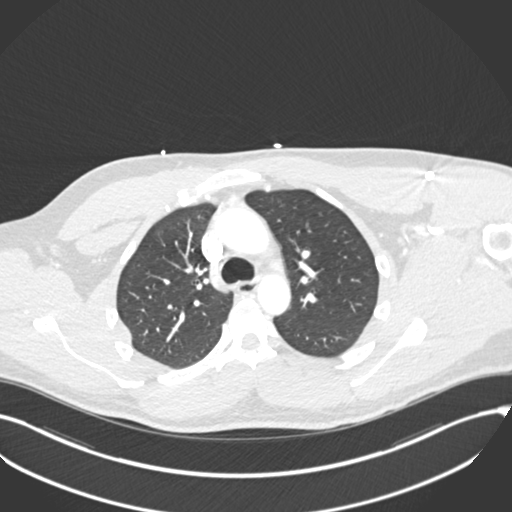
[im 318/418  soft-tissue]
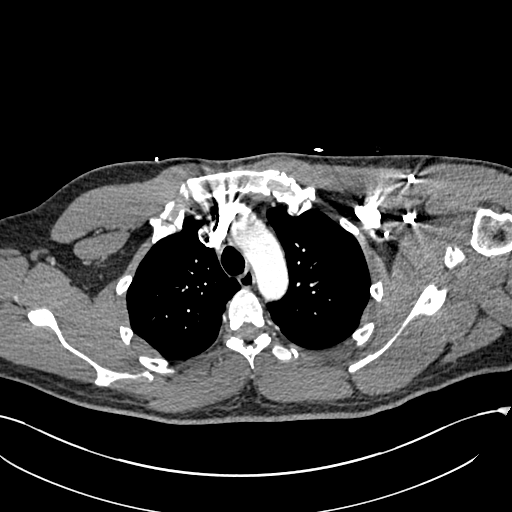
[im 338/418  lung]
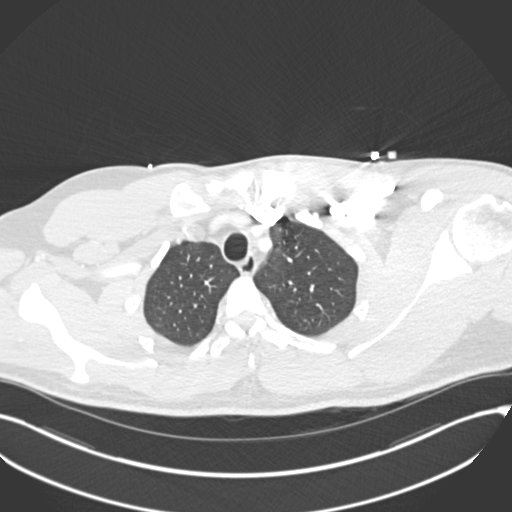
[im 378/418  soft-tissue]
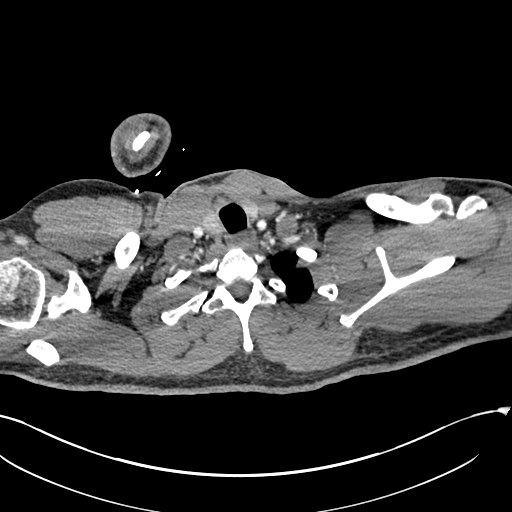
[im 398/418  lung]
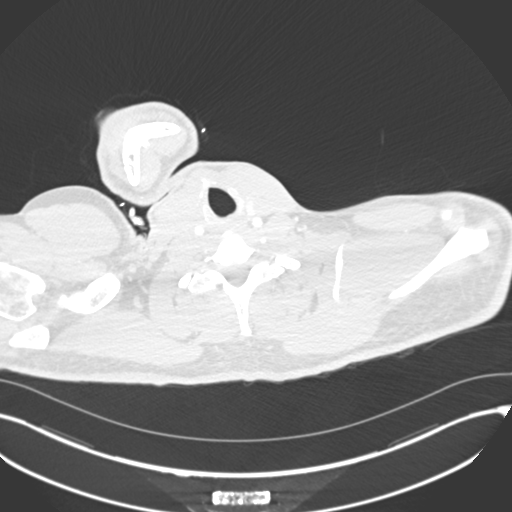

[Series 7: coronal mpr · coronal · 0.68mm/px · 3 of 91 slices shown]
[im 23/91  soft-tissue]
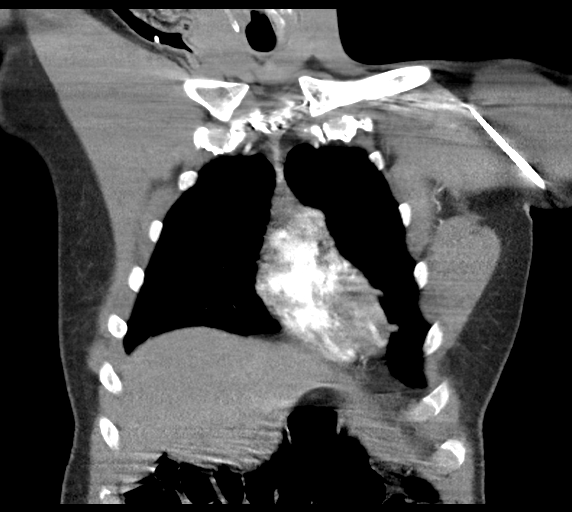
[im 46/91  soft-tissue]
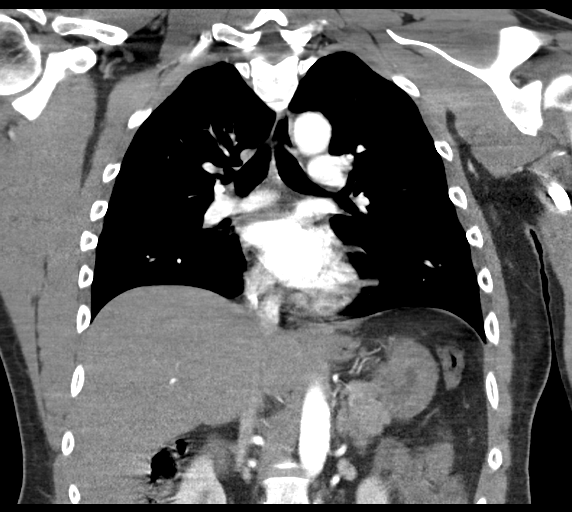
[im 68/91  soft-tissue]
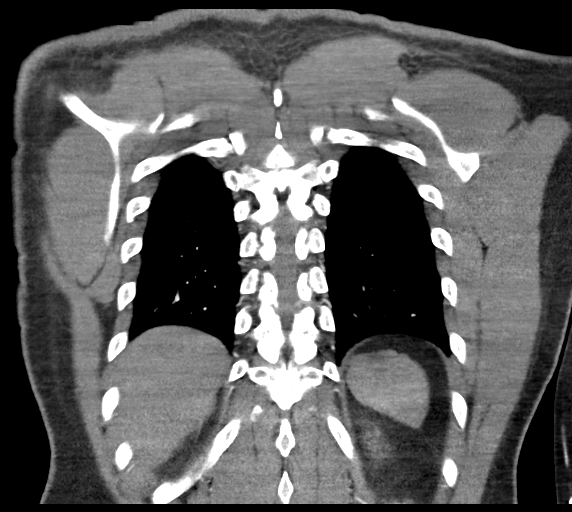

[18 of 46 positions shown; findings below may reference images not displayed]

RADIATION DOSE REDUCTION: This exam was performed according to the
departmental dose-optimization program which includes automated
exposure control, adjustment of the mA and/or kV according to
patient size and/or use of iterative reconstruction technique.

CONTRAST:  75mL OMNIPAQUE IOHEXOL 350 MG/ML SOLN
FINDINGS: Cardiovascular: The pulmonary arteries are normal in caliber without
evidence of embolic filling defects. The aorta is normal in course
and caliber without atherosclerotic changes. The great vessels show
normal branching and opacification.

The cardiac size is normal. There is no visible coronary artery
calcification , no pericardial effusion. Central pulmonary veins are
normal in caliber.

Mediastinum/Nodes: No enlarged mediastinal, hilar, or axillary lymph
nodes. Thyroid gland, trachea, and esophagus demonstrate no
significant findings. There is no hiatal hernia.

Lungs/Pleura: noncalcified pulmonary nodule measuring 6 mm, series 6
axial 59; fissural noncalcified 4 mm right lower lobe nodule noted
on axial 45. The central airways and lungs are otherwise clear.
There is no pleural effusion, thickening or pneumothorax.

Upper Abdomen: No acute abnormality.

Musculoskeletal: No chest wall abnormality or acute or significant
osseous findings. Mild thoracic kyphosis. The ribcage is intact.

Review of the MIP images confirms the above findings.
IMPRESSION: 1. No evidence of arterial dilatation or emboli.
2. No other evidence of acute CT or CTA abnormality.
3. 6 mm left lower lobe nodule and a 4 mm right lower lobe nodule.
Recommend a noncontrast chest CT 3-6 months. If patient is high risk
for malignancy, recommend an additional noncontrast chest CT at
chest CT at 18-24 months is optional. These guidelines do not apply
to immunocompromised patients and patients with cancer. F/U in
patients with significant comorbidities as clinically warranted. For
lung cancer screening, adhere to Lung-RADS guidelines. Reference:

## 2023-01-03 ENCOUNTER — Emergency Department
Admission: EM | Admit: 2023-01-03 | Discharge: 2023-01-04 | Disposition: A | Discharge status: Home / Self Care | Payer: No Typology Code available for payment source | Attending: Emergency Medicine | Admitting: Emergency Medicine

## 2023-01-03 DIAGNOSIS — Z1152 Encounter for screening for COVID-19: Secondary | ICD-10-CM | POA: Insufficient documentation

## 2023-01-03 DIAGNOSIS — Z91010 Allergy to peanuts: Secondary | ICD-10-CM | POA: Insufficient documentation

## 2023-01-03 DIAGNOSIS — R4689 Other symptoms and signs involving appearance and behavior: Secondary | ICD-10-CM

## 2023-01-03 DIAGNOSIS — F4310 Post-traumatic stress disorder, unspecified: Secondary | ICD-10-CM | POA: Diagnosis present

## 2023-01-03 DIAGNOSIS — F331 Major depressive disorder, recurrent, moderate: Secondary | ICD-10-CM

## 2023-01-03 DIAGNOSIS — I10 Essential (primary) hypertension: Secondary | ICD-10-CM | POA: Diagnosis not present

## 2023-01-03 DIAGNOSIS — F1414 Cocaine abuse with cocaine-induced mood disorder: Secondary | ICD-10-CM | POA: Diagnosis not present

## 2023-01-03 DIAGNOSIS — F32A Depression, unspecified: Secondary | ICD-10-CM | POA: Diagnosis present

## 2023-01-03 DIAGNOSIS — R451 Restlessness and agitation: Secondary | ICD-10-CM | POA: Diagnosis present

## 2023-01-03 DIAGNOSIS — F1014 Alcohol abuse with alcohol-induced mood disorder: Secondary | ICD-10-CM | POA: Diagnosis present

## 2023-01-03 DIAGNOSIS — F1424 Cocaine dependence with cocaine-induced mood disorder: Secondary | ICD-10-CM | POA: Diagnosis not present

## 2023-01-03 DIAGNOSIS — F1510 Other stimulant abuse, uncomplicated: Secondary | ICD-10-CM | POA: Diagnosis present

## 2023-01-03 DIAGNOSIS — F1910 Other psychoactive substance abuse, uncomplicated: Secondary | ICD-10-CM | POA: Diagnosis present

## 2023-01-03 DIAGNOSIS — F17210 Nicotine dependence, cigarettes, uncomplicated: Secondary | ICD-10-CM | POA: Insufficient documentation

## 2023-01-03 DIAGNOSIS — S069XAA Unspecified intracranial injury with loss of consciousness status unknown, initial encounter: Secondary | ICD-10-CM | POA: Diagnosis present

## 2023-01-03 DIAGNOSIS — S069X0D Unspecified intracranial injury without loss of consciousness, subsequent encounter: Secondary | ICD-10-CM | POA: Diagnosis not present

## 2023-01-03 DIAGNOSIS — F1420 Cocaine dependence, uncomplicated: Secondary | ICD-10-CM | POA: Diagnosis present

## 2023-01-03 LAB — CBC
HCT: 46.6 % (ref 39.0–52.0)
Hemoglobin: 15.7 g/dL (ref 13.0–17.0)
MCH: 29.7 pg (ref 26.0–34.0)
MCHC: 33.7 g/dL (ref 30.0–36.0)
MCV: 88.3 fL (ref 80.0–100.0)
Platelets: 236 10*3/uL (ref 150–400)
RBC: 5.28 MIL/uL (ref 4.22–5.81)
RDW: 13.7 % (ref 11.5–15.5)
WBC: 12.4 10*3/uL — ABNORMAL HIGH (ref 4.0–10.5)
nRBC: 0 % (ref 0.0–0.2)

## 2023-01-03 LAB — COMPREHENSIVE METABOLIC PANEL
ALT: 17 U/L (ref 0–44)
AST: 42 U/L — ABNORMAL HIGH (ref 15–41)
Albumin: 5 g/dL (ref 3.5–5.0)
Alkaline Phosphatase: 71 U/L (ref 38–126)
Anion gap: 14 (ref 5–15)
BUN: 18 mg/dL (ref 6–20)
CO2: 21 mmol/L — ABNORMAL LOW (ref 22–32)
Calcium: 10.1 mg/dL (ref 8.9–10.3)
Chloride: 99 mmol/L (ref 98–111)
Creatinine, Ser: 1.65 mg/dL — ABNORMAL HIGH (ref 0.61–1.24)
GFR, Estimated: 54 mL/min — ABNORMAL LOW (ref >60)
Glucose, Bld: 114 mg/dL — ABNORMAL HIGH (ref 70–99)
Potassium: 4.1 mmol/L (ref 3.5–5.1)
Sodium: 134 mmol/L — ABNORMAL LOW (ref 135–145)
Total Bilirubin: 1.6 mg/dL — ABNORMAL HIGH (ref 0.3–1.2)
Total Protein: 9 g/dL — ABNORMAL HIGH (ref 6.5–8.1)

## 2023-01-03 LAB — ACETAMINOPHEN LEVEL: Acetaminophen (Tylenol), Serum: <10 ug/mL — ABNORMAL LOW (ref 10–30)

## 2023-01-03 LAB — RESP PANEL BY RT-PCR (RSV, FLU A&B, COVID)  RVPGX2
Influenza A by PCR: NEGATIVE
Influenza B by PCR: NEGATIVE
Resp Syncytial Virus by PCR: NEGATIVE
SARS Coronavirus 2 by RT PCR: NEGATIVE

## 2023-01-03 LAB — SALICYLATE LEVEL: Salicylate Lvl: <7 mg/dL — ABNORMAL LOW (ref 7.0–30.0)

## 2023-01-03 LAB — ETHANOL: Alcohol, Ethyl (B): <10 mg/dL (ref <10)

## 2023-01-03 MED ORDER — DIVALPROEX SODIUM 500 MG PO DR TAB
500.0000 mg | DELAYED_RELEASE_TABLET | Freq: Two times a day (BID) | ORAL | Status: DC
  Administered 2023-01-03 – 2023-01-04 (×2): 500 mg via ORAL
  Filled 2023-01-03 (×2): qty 1

## 2023-01-03 MED ORDER — GABAPENTIN 300 MG PO CAPS
300.0000 mg | ORAL_CAPSULE | Freq: Three times a day (TID) | ORAL | Status: DC
  Administered 2023-01-03 – 2023-01-04 (×3): 300 mg via ORAL
  Filled 2023-01-03 (×3): qty 1

## 2023-01-03 MED ORDER — HALOPERIDOL 0.5 MG PO TABS
2.0000 mg | ORAL_TABLET | Freq: Two times a day (BID) | ORAL | Status: DC
  Administered 2023-01-03 – 2023-01-04 (×2): 2 mg via ORAL
  Filled 2023-01-03: qty 4
  Filled 2023-01-03: qty 1
  Filled 2023-01-03: qty 4

## 2023-01-03 MED ORDER — LORAZEPAM 1 MG PO TABS
1.0000 mg | ORAL_TABLET | Freq: Once | ORAL | Status: DC
  Filled 2023-01-03: qty 1

## 2023-01-03 MED ORDER — TRAZODONE HCL 100 MG PO TABS: 100.0000 mg | ORAL_TABLET | Freq: Every evening | ORAL | Status: DC | PRN

## 2023-01-03 MED ORDER — MIDAZOLAM HCL 2 MG/2ML IJ SOLN
2.0000 mg | Freq: Once | INTRAMUSCULAR | Status: AC
  Administered 2023-01-03: 2 mg via INTRAMUSCULAR
  Filled 2023-01-03: qty 2

## 2023-01-03 MED ORDER — OLANZAPINE 10 MG IM SOLR
5.0000 mg | Freq: Once | INTRAMUSCULAR | Status: AC
  Administered 2023-01-03: 5 mg via INTRAMUSCULAR
  Filled 2023-01-03: qty 10

## 2023-01-03 MED ORDER — MIDAZOLAM HCL 2 MG/2ML IJ SOLN
5.0000 mg | Freq: Once | INTRAMUSCULAR | Status: AC
  Administered 2023-01-03: 5 mg via INTRAMUSCULAR
  Filled 2023-01-03: qty 6

## 2023-01-03 MED ORDER — PANTOPRAZOLE SODIUM 40 MG PO TBEC
80.0000 mg | DELAYED_RELEASE_TABLET | Freq: Every day | ORAL | Status: DC
  Administered 2023-01-04: 80 mg via ORAL
  Filled 2023-01-03: qty 2

## 2023-01-03 NOTE — Progress Notes (Signed)
Client was using meth and cocaine when he became psychotic with hallucinations and paranoia.  The team attempted an assessment prior to agitation medications and he was nonverbal at that point.  Psych will continue to try and assess.  Waylan Boga, PMHNP

## 2023-01-03 NOTE — ED Provider Notes (Signed)
Emergency Medicine Observation Re-evaluation Note  Jeremy Frank is a 37 y.o. male, seen on rounds today.  Pt initially presented to the ED for complaints of Psychiatric Evaluation  Currently, the patient is is in acute distress.  Patient agitated and complaining of chest pain.  Does endorse cocaine use.  On chart review patient has a history of cocaine abuse.  Denies any suicidal or homicidal ideation.  States that he needs help from the [place].  Patient has not yet been evaluated by psychiatry.  Nursing staff states that he has been having hallucinations.  EKG showed normal sinus rhythm.  Normal intervals.  No significant ST elevation or depression.  No signs of acute ischemia or dysrhythmia.  Attempted p.o. Ativan however required IM Versed for agitation.  Given ongoing hallucinations with agitated behavior patient was involuntary committed.  Physical Exam  Blood pressure (!) 152/115, pulse 93, temperature 99.5 F (37.5 C), temperature source Oral, resp. rate 18, height 5\' 11"  (1.803 m), weight 106.2 kg, SpO2 100 %.  Physical Exam: General: No apparent distress Pulm: Normal WOB Neuro: Moving all extremities Psych: Agitated and standing in the hallway     ED Course / MDM   Clinical Course as of 01/03/23 0855  Sat Jan 03, 2023  0719 Polysubstance abuse with meth and cocaine with a history of schizophrenia - no SI or HI. Psych to re-eval this morning.  [SM]    Clinical Course User Index [SM] Nathaniel Man, MD    I have reviewed the labs performed to date as well as medications administered while in observation.   Plan   Current plan: Patient awaiting psychiatric disposition. Patient IS under full IVC at this time.    Nathaniel Man, MD 01/03/23 [phone]

## 2023-01-03 NOTE — ED Notes (Signed)
Psych NP speaking with patient 
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
## 2023-01-03 NOTE — ED Notes (Signed)
Pt continues to get progressively more paranoid and stating his chest hurts- attempted to verbally deescalate pt, but he is not redirectable- Dr Jori Moll made aware of pt behavior and complaints

## 2023-01-03 NOTE — ED Notes (Signed)
Pt given lunch tray.

## 2023-01-03 NOTE — ED Notes (Signed)
Pt continues to be paranoid and not redirectable- pt refused to take oral medication despite education- pt refusing to go back in his room- Dr Jori Moll placed new orders- security at bedside

## 2023-01-03 NOTE — BH Assessment (Signed)
Comprehensive Clinical Assessment (CCA) Note  01/03/2023 Jeremy Frank [id number]  Chief Complaint:  Chief Complaint  Patient presents with   Psychiatric Evaluation   Jeremy Frank arrived to the ED by way of walking.  He reports that he wants to detox from alcohol.  He reports that he has been drinking for 34 years.  He shared that he has been able to stay sober for 7 months in 2016.   He states that in the last 6 months he has been sober for 45 days.  He shared that today he has been hearing voices and hallucinations.  He shared that he has been seeing Copy".  He was vague on what he was hearing or seeing. "Anything you can think of" . He denied symptoms of depression or anxiety.  He denied suicidal or homicidal ideation or intent.  He reports that he has drank 96 oz of beer.  He further reports that he has used marijuana and methamphetamine.     Visit Diagnosis: Substance induces psychotic disorder with hallucinations, Alcohol Abuse, Amphetamine Abuse   CCA Screening, Triage and Referral (STR)  Patient Reported Information How did you hear about [place]? Self  Referral name: No data recorded Referral phone number: No data recorded  Whom do you see for routine medical problems? No data recorded Practice/Facility Name: No data recorded Practice/Facility Phone Number: No data recorded Name of Contact: No data recorded Contact Number: No data recorded Contact Fax Number: No data recorded Prescriber Name: No data recorded Prescriber Address (if known): No data recorded  What Is the Reason for Your Visit/Call Today? Presents via EMS from home  States he was involved in a verbal altercation this am  then developed some chest discomfort  How Long Has This Been Causing You Problems? <Week  What Do You Feel Would Help You the Most Today? Alcohol or Drug Use Treatment   Have You Recently Been in Any Inpatient Treatment (Hospital/Detox/Crisis Center/28-Day Program)? No data  recorded Name/Location of Program/Hospital:No data recorded How Long Were You There? No data recorded When Were You Discharged? No data recorded  Have You Ever Received Services From [facility] Before? No data recorded Who Do You See at [facility]? No data recorded  Have You Recently Had Any Thoughts About Hurting Yourself? No  Are You Planning to Commit Suicide/Harm Yourself At This time? No   Have you Recently Had Thoughts About Hurting Someone [name]? No  Explanation: No data recorded  Have You Used Any Alcohol or Drugs in the Past 24 Hours? Yes  How Long Ago Did You Use Drugs or Alcohol? No data recorded What Did You Use and How Much? Alcoholl, mjarijuana, and methamphentamine.   Do You Currently Have a Therapist/Psychiatrist? Yes  Name of Therapist/Psychiatrist: [facility]   Have You Been Recently Discharged From Any Public relations account executive or Programs? Yes  Explanation of Discharge From Practice/Program: No data recorded    CCA Screening Triage Referral Assessment Type of Contact: Face-to-Face  Is this Initial or Reassessment? No data recorded Date Telepsych consult ordered in [facility]:  No data recorded Time Telepsych consult ordered in [facility]:  No data recorded  Patient Reported Information Reviewed? No data recorded Patient Left Without Being Seen? No data recorded Reason for Not Completing Assessment: No data recorded  Collateral Involvement: None provided   Does Patient Have a Court Appointed Legal Guardian? No data recorded Name and Contact of Legal Guardian: No data recorded If Minor and Not Living with Parent(s), Who  has Custody? N/A  Is CPS involved or ever been involved? Never  Is APS involved or ever been involved? Never   Patient Determined To Be At Risk for Harm To Self or Others Based on Review of Patient Reported Information or Presenting Complaint? No  Method: No data recorded Availability of Means: No data recorded Intent: No data  recorded Notification Required: No data recorded Additional Information for Danger to Others Potential: No data recorded Additional Comments for Danger to Others Potential: No data recorded Are There Guns or Other Weapons in Your Home? No  Types of Guns/Weapons: No data recorded Are These Weapons Safely Secured?                            No data recorded Who Could Verify You Are Able To Have These Secured: No data recorded Do You Have any Outstanding Charges, Pending Court Dates, Parole/Probation? No data recorded Contacted To Inform of Risk of Harm To Self or Others: Other: Comment   Location of Assessment: [facility] ED   Does Patient Present under Involuntary Commitment? No  IVC Papers Initial File Date: 10/11/22   [place] of Residence: [place]   Patient Currently Receiving the Following Services: Medication Management   Determination of Need: Emergent (2 hours)   Options For Referral: Chemical Dependency Intensive Outpatient Therapy (CDIOP)     CCA Biopsychosocial Intake/Chief Complaint:  No data recorded Current Symptoms/Problems: No data recorded  Patient Reported Schizophrenia/Schizoaffective Diagnosis in Past: No   Strengths: pt is able to ask for help/motivated for change  Preferences: No data recorded Abilities: No data recorded  Type of Services Patient Feels are Needed: No data recorded  Initial Clinical Notes/Concerns: No data recorded  Mental Health Symptoms Depression:   None   Duration of Depressive symptoms:  Greater than two weeks   Mania:   N/A   Anxiety:    None   Psychosis:   Hallucinations   Duration of Psychotic symptoms:  Greater than six months   Trauma:   Hypervigilance; Difficulty staying/falling asleep; Avoids reminders of event   Obsessions:   N/A   Compulsions:   N/A   Inattention:   N/A   Hyperactivity/Impulsivity:   N/A   Oppositional/Defiant Behaviors:   None   Emotional Irregularity:   Potentially  harmful impulsivity   Other Mood/Personality Symptoms:  No data recorded   Mental Status Exam Appearance and self-care  Stature:   Average   Weight:   Average weight   Clothing:   Casual (In scrubs)   Grooming:   Normal   Cosmetic use:   None   Posture/gait:   Normal   Motor activity:   Restless   Sensorium  Attention:   Distractible   Concentration:   Anxiety interferes; Scattered   Orientation:   Person; Place; Object; Situation; X5; Time   Recall/memory:   Normal   Affect and Mood  Affect:   Anxious; Congruent   Mood:   Anxious; Depressed   Relating  Eye contact:   Fleeting   Facial expression:   Anxious   Attitude toward examiner:   Cooperative   Thought and Language  Speech flow:  Clear and Coherent   Thought content:   Appropriate to Mood and Circumstances   Preoccupation:   None   Hallucinations:   None   Organization:  No data recorded  [employer] of Knowledge:   Fair   Intelligence:   Average  Abstraction:   Functional   Judgement:   Fair   Reality Testing:   Adequate   Insight:   Present   Decision Making:   Vacilates   Social Functioning  Social Maturity:   Irresponsible; Impulsive   Social Judgement:   "Chief of Staff"; Heedless   Stress  Stressors:   Housing; Office manager Ability:   Contractor Deficits:   None   Supports:   Support needed     Religion: Religion/Spirituality Are You A Religious Person?: No How Might This Affect Treatment?: n/a  Leisure/Recreation: Leisure / Recreation Do You Have Hobbies?: No  Exercise/Diet: Exercise/Diet Do You Exercise?: No Have You Gained or Lost A Significant Amount of Weight in the Past Six Months?: No Do You Follow a Special Diet?: No Do You Have Any Trouble Sleeping?: Yes   CCA Employment/Education Employment/Work Situation: Employment / Work Situation Employment Situation: On disability Patient's Job  has Been Impacted by Current Illness: No Has Patient ever Been in the [employer]?: Yes (Describe in comment)  Education: Education Last Grade Completed: 12 Did You Product manager?: No Did You Have An Individualized Education Program (IIEP): No Did You Have Any Difficulty At School?: No   CCA Family/Childhood History Family and Relationship History: Family history Marital status: Single Does patient have children?: Yes How many children?: 1  Childhood History:  Childhood History By whom was/is the patient raised?: Grandparents Did patient suffer any verbal/emotional/physical/sexual abuse as a child?: No Did patient suffer from severe childhood neglect?: No  Child/Adolescent Assessment:     CCA Substance Use Alcohol/Drug Use: Alcohol / Drug Use Pain Medications: See PTA Prescriptions: See PTA Over the Counter: See PTA History of alcohol / drug use?: Yes Longest period of sobriety (when/how long): Unable to quantify Negative Consequences of Use: Personal relationships Withdrawal Symptoms: Tremors, Change in blood pressure, Sweats                         ASAM's:  Six Dimensions of Multidimensional Assessment  Dimension 1:  Acute Intoxication and/or Withdrawal Potential:   Dimension 1:  Description of individual's past and current experiences of substance use and withdrawal: Pt has a chronic hx of polysubstance abuse  Dimension 2:  Biomedical Conditions and Complications:      Dimension 3:  Emotional, Behavioral, or Cognitive Conditions and Complications:     Dimension 4:  Readiness to Change:     Dimension 5:  Relapse, Continued use, or Continued Problem Potential:     Dimension 6:  Recovery/Living Environment:     ASAM Severity Score: ASAM's Severity Rating Score: 16  ASAM Recommended Level of Treatment: ASAM Recommended Level of Treatment: Level II Partial Hospitalization Treatment   Substance use Disorder (SUD) Substance Use Disorder (SUD)   Checklist Symptoms of Substance Use: Continued use despite having a persistent/recurrent physical/psychological problem caused/exacerbated by use, Continued use despite persistent or recurrent social, interpersonal problems, caused or exacerbated by use, Presence of craving or strong urge to use, Persistent desire or unsuccessful efforts to cut down or control use, Social, occupational, recreational activities given up or reduced due to use, Recurrent use that results in a failure to fulfill major role obligations (work, school, home), Substance(s) often taken in larger amounts or over longer times than was intended  Recommendations for Services/Supports/Treatments: Recommendations for Services/Supports/Treatments Recommendations For Services/Supports/Treatments: SAIOP (Substance Abuse Intensive Outpatient Program), Medication Management, Detox, Individual Therapy, IOP (Intensive Outpatient Program)  DSM5 Diagnoses: Patient Active  Problem List   Diagnosis Date Noted   Cocaine abuse with cocaine-induced mood disorder ([place]) 10/11/2022   Polysubstance abuse ([place]) 02/10/2022   Depression 02/10/2022   Alcohol abuse 02/10/2022   Tobacco abuse 02/10/2022   HTN (hypertension) 02/10/2022   Lung nodules 02/10/2022   GERD (gastroesophageal reflux disease) 02/10/2022   Chronic kidney disease, stage 3a ([place]) 02/10/2022   Migraine 02/10/2022   Cannabis dependence, continuous ([place]) 06/18/2021   Alcohol abuse with alcohol-induced mood disorder ([place]) 06/18/2021   Crohn's disease ([place]) 06/18/2021   Peptic ulcer 06/18/2021   Severe alcohol dependence ([place]) 06/18/2021   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

## 2023-01-03 NOTE — Consult Note (Signed)
[facility] Consult   Reason for Consult:  Psychiatric evaluation Referring Physician:  Dr. Jori Moll Patient Identification: Jeremy Frank MRN:  [id number] Principal Diagnosis: Cocaine abuse with cocaine-induced mood disorder ([place]) Diagnosis:  Principal Problem:   Cocaine abuse with cocaine-induced mood disorder ([place]) Active Problems:   Posttraumatic stress disorder   Traumatic brain injury ([place])   Cocaine dependence ([place])   Amphetamine abuse ([place])   Alcohol abuse with alcohol-induced mood disorder ([place])   Polysubstance abuse ([place])   Depression   Total Time spent with patient: 30 minutes  Subjective:   "I'm depressed and having a rough time"  HPI:  Jeremy Frank, 37 y.o., male patient seen by this provider; chart reviewed and consulted with EDP on 01/03/23.   Per chart review, patient has a history of PTSD,substance induced psychosis, cocaine dependence, and amphetamine abuse. On assessment Jeremy Frank reports that he has been hearing voices when he uses "substances".  During evaluation Jeremy Frank is sitting on the bed; he is easily engaged when asked to participate in the assessment. He is alert/oriented x 4. He is cooperative; and mood is depressed with flat and congruent affect.  Patient is speaking in a low tone at moderate volume, and normal pace; with fair to poor eye contact. His  thought process is coherent and relevant; There is no indication that he is currently responding to internal/external stimuli. There is no indication that he is experiencing delusional thought content.  Patient denies suicidal/self-harm and denies homicidal ideation. There is no evidence of psychosis, and paranoia despite initial presentation.    Patient is a veteran and states that he recently went through a 45 day detox program which he felt was beneficial.  He says he has a follow-up appt with the [place] for further treatment.  He is currently homeless and admits to having " a  lot of challenges".  He isays he is committed to following up with the [place].    Past Psychiatric History: Principal Problem:   Cocaine abuse with cocaine-induced mood disorder (H[facility]) Active Problems:   Posttraumatic stress disorder   Traumatic brain injury ([place])   Cocaine dependence (H[facility])   Amphetamine abuse ([place])   Alcohol abuse with alcohol-induced mood disorder (H[facility])   Polysubstance abuse ([place])   Depression  Risk to Self:  No Risk to Others:  No Prior Inpatient Therapy:   Prior Outpatient Therapy:    Past Medical History:  Past Medical History:  Diagnosis Date   Drug dependence ([place])    GI bleed    Hiatal hernia    Hypertension    Migraine    PTSD (post-traumatic stress disorder)     Past Surgical History:  Procedure Laterality Date   KNEE SURGERY     Family History:  Family History  Problem Relation Age of Onset   Heart failure Mother    Hypertension Father    Family Psychiatric  History: unknown Social History:  Social History   Substance and Sexual Activity  Alcohol Use Yes   Alcohol/week: 42.0 standard drinks of alcohol   Types: 42 Cans of beer per week   Comment: per pt he drinks 6-12 cans of beer daily     Social History   Substance and Sexual Activity  Drug Use Yes   Types: Cocaine, Marijuana, Amphetamines    Social History   Socioeconomic History   Marital status: Single    Spouse name: Not on file   Number of children: Not on file  Years of education: Not on file   Highest education level: Not on file  Occupational History   Not on file  Tobacco Use   Smoking status: Every Day    Packs/day: 0.25    Types: Cigarettes   Smokeless tobacco: Never  Vaping Use   Vaping Use: Never used  Substance and Sexual Activity   Alcohol use: Yes    Alcohol/week: 42.0 standard drinks of alcohol    Types: 42 Cans of beer per week    Comment: per pt he drinks 6-12 cans of beer daily   Drug use: Yes    Types: Cocaine, Marijuana, Amphetamines   Sexual  activity: Not Currently  Other Topics Concern   Not on file  Social History Narrative   Not on file   Social Determinants of Health   Financial Resource Strain: Not on file  Food Insecurity: Not on file  Transportation Needs: Not on file  Physical Activity: Not on file  Stress: Not on file  Social Connections: Not on file   Additional Social History:    Allergies:   Allergies  Allergen Reactions   Peanut-Containing Drug Products Diarrhea, Anaphylaxis and Swelling   Cheese Diarrhea   Eggs Or Egg-Derived Products Diarrhea    Labs:  Results for orders placed or performed during the hospital encounter of 01/03/23 (from the past 48 hour(s))  Comprehensive metabolic panel     Status: Abnormal   Collection Time: 01/03/23  5:01 AM  Result Value Ref Range   Sodium 134 (L) 135 - 145 mmol/L   Potassium 4.1 3.5 - 5.1 mmol/L    Comment: HEMOLYSIS AT THIS LEVEL MAY AFFECT RESULT   Chloride 99 98 - 111 mmol/L   CO2 21 (L) 22 - 32 mmol/L   Glucose, Bld 114 (H) 70 - 99 mg/dL    Comment: Glucose reference range applies only to samples taken after fasting for at least 8 hours.   BUN 18 6 - 20 mg/dL   Creatinine, Ser 1.65 (H) 0.61 - 1.24 mg/dL   Calcium 10.1 8.9 - 10.3 mg/dL   Total Protein 9.0 (H) 6.5 - 8.1 g/dL   Albumin 5.0 3.5 - 5.0 g/dL   AST 42 (H) 15 - 41 U/L   ALT 17 0 - 44 U/L   Alkaline Phosphatase 71 38 - 126 U/L   Total Bilirubin 1.6 (H) 0.3 - 1.2 mg/dL   GFR, Estimated 54 (L) >60 mL/min    Comment: (NOTE) Calculated using the CKD-EPI Creatinine Equation (2021)    Anion gap 14 5 - 15    Comment: Performed at [facility], [address] [place], [place] [address]  Ethanol     Status: None   Collection Time: 01/03/23  5:01 AM  Result Value Ref Range   Alcohol, Ethyl (B) <10 <10 mg/dL    Comment: (NOTE) Lowest detectable limit for serum alcohol is 10 mg/dL.  For medical purposes only. Performed at [facility], [address]  [place], [place] [id number]   Salicylate level     Status: Abnormal   Collection Time: 01/03/23  5:01 AM  Result Value Ref Range   Salicylate Lvl [id number] (L) 7.0 - 30.0 mg/dL    Comment: Performed at [facility], [address] [place], [place] [address]  Acetaminophen level     Status: Abnormal   Collection Time: 01/03/23  5:01 AM  Result Value Ref Range   Acetaminophen (Tylenol), Serum <10 (L) 10 - 30 ug/mL  Comment: (NOTE) Therapeutic concentrations vary significantly. A range of 10-30 ug/mL  may be an effective concentration for many patients. However, some  are best treated at concentrations outside of this range. Acetaminophen concentrations >150 ug/mL at 4 hours after ingestion  and >50 ug/mL at 12 hours after ingestion are often associated with  toxic reactions.  Performed at [facility], [address] [place], [place] [address]   cbc     Status: Abnormal   Collection Time: 01/03/23  5:01 AM  Result Value Ref Range   WBC 12.4 (H) 4.0 - 10.5 K/uL   RBC 5.28 4.22 - 5.81 MIL/uL   Hemoglobin 15.7 13.0 - 17.0 g/dL   HCT 46.6 39.0 - 52.0 %   MCV 88.3 80.0 - 100.0 fL   MCH 29.7 26.0 - 34.0 pg   MCHC 33.7 30.0 - 36.0 g/dL   RDW 13.7 11.5 - 15.5 %   Platelets 236 150 - 400 K/uL   nRBC 0.0 0.0 - 0.2 %    Comment: Performed at [facility], [address], [place], [place] [address]  Resp panel by RT-PCR (RSV, Flu A&B, Covid) Anterior Nasal Swab     Status: None   Collection Time: 01/03/23 12:08 PM   Specimen: Anterior Nasal Swab  Result Value Ref Range   SARS Coronavirus 2 by RT PCR NEGATIVE NEGATIVE    Comment: (NOTE) SARS-CoV-2 target nucleic acids are NOT DETECTED.  The SARS-CoV-2 RNA is generally detectable in upper respiratory specimens during the acute phase of infection. The lowest concentration of SARS-CoV-2 viral copies this assay can detect is 138 copies/mL. A negative result does not preclude SARS-Cov-2 infection and should not be  used as the sole basis for treatment or other patient management decisions. A negative result may occur with  improper specimen collection/handling, submission of specimen other than nasopharyngeal swab, presence of viral mutation(s) within the areas targeted by this assay, and inadequate number of viral copies(<138 copies/mL). A negative result must be combined with clinical observations, patient history, and epidemiological information. The expected result is Negative.  Fact Sheet for Patients:  EntrepreneurPulse.com.au  Fact Sheet for Healthcare Providers:  IncredibleEmployment.be  This test is no t yet approved or cleared by the [place] FDA and  has been authorized for detection and/or diagnosis of SARS-CoV-2 by FDA under an Emergency Use Authorization (EUA). This EUA will remain  in effect (meaning this test can be used) for the duration of the COVID-19 declaration under Section 564(b)(1) of the Act, 21 U.S.C.section 360bbb-3(b)(1), unless the authorization is terminated  or revoked sooner.       Influenza A by PCR NEGATIVE NEGATIVE   Influenza B by PCR NEGATIVE NEGATIVE    Comment: (NOTE) The Xpert Xpress SARS-CoV-2/FLU/RSV plus assay is intended as an aid in the diagnosis of influenza from Nasopharyngeal swab specimens and should not be used as a sole basis for treatment. Nasal washings and aspirates are unacceptable for Xpert Xpress SARS-CoV-2/FLU/RSV testing.  Fact Sheet for Patients: EntrepreneurPulse.com.au  Fact Sheet for Healthcare Providers: IncredibleEmployment.be  This test is not yet approved or cleared by the [place] FDA and has been authorized for detection and/or diagnosis of SARS-CoV-2 by FDA under an Emergency Use Authorization (EUA). This EUA will remain in effect (meaning this test can be used) for the duration of the COVID-19 declaration under Section 564(b)(1) of the  Act, 21 U.S.C. section 360bbb-3(b)(1), unless the authorization is terminated or revoked.     Resp Syncytial Virus  by PCR NEGATIVE NEGATIVE    Comment: (NOTE) Fact Sheet for Patients: BloggerCourse.com  Fact Sheet for Healthcare Providers: SeriousBroker.it  This test is not yet approved or cleared by the [place] FDA and has been authorized for detection and/or diagnosis of SARS-CoV-2 by FDA under an Emergency Use Authorization (EUA). This EUA will remain in effect (meaning this test can be used) for the duration of the COVID-19 declaration under Section 564(b)(1) of the Act, 21 U.S.C. section 360bbb-3(b)(1), unless the authorization is terminated or revoked.  Performed at [facility], [address] [place], [place] [address]     Current Facility-Administered Medications  Medication Dose Route Frequency Provider Last Rate Last Admin   divalproex (DEPAKOTE) DR tablet 500 mg  500 mg Oral Q12H Charm Rings, NP   500 mg at 01/03/23 2123   gabapentin (NEURONTIN) capsule 300 mg  300 mg Oral TID Charm Rings, NP   300 mg at 01/03/23 2118   haloperidol (HALDOL) tablet 2 mg  2 mg Oral BID Charm Rings, NP   2 mg at 01/03/23 2119   LORazepam (ATIVAN) tablet 1 mg  1 mg Oral Once Corena Herter, MD       pantoprazole (PROTONIX) EC tablet 80 mg  80 mg Oral Daily Charm Rings, NP       traZODone (DESYREL) tablet 100 mg  100 mg Oral QHS PRN Charm Rings, NP       Current Outpatient Medications  Medication Sig Dispense Refill   divalproex (DEPAKOTE) 500 MG DR tablet Take 1 tablet (500 mg total) by mouth every 12 (twelve) hours. (Patient not taking: Reported on 04/01/2022) 60 tablet 1   gabapentin (NEURONTIN) 300 MG capsule Take 1 capsule (300 mg total) by mouth 3 (three) times daily. (Patient not taking: Reported on 04/01/2022) 90 capsule 1   haloperidol (HALDOL) 2 MG tablet Take 2 mg by mouth 2 (two) times daily.  (Patient not taking: Reported on 09/27/2022)     hydrOXYzine (ATARAX) 50 MG tablet Take 1 tablet (50 mg total) by mouth every 4 (four) hours as needed for anxiety. (Patient not taking: Reported on 09/27/2022) 30 tablet 1   OLANZapine (ZYPREXA) 5 MG tablet Take 1 tablet (5 mg total) by mouth at bedtime. (Patient not taking: Reported on 09/27/2022) 30 tablet WARM   pantoprazole (PROTONIX) 40 MG tablet Take 2 tablets (80 mg total) by mouth daily. (Patient not taking: Reported on 09/27/2022) 60 tablet 1   traZODone (DESYREL) 100 MG tablet Take 1 tablet (100 mg total) by mouth at bedtime as needed for sleep. (Patient not taking: Reported on 04/01/2022) 30 tablet 1    Musculoskeletal: Strength & Muscle Tone: within normal limits Gait & Station: normal Patient leans: N/A  Psychiatric Specialty Exam:  Presentation  General Appearance:  Appropriate for Environment  Eye Contact: Fair  Speech: Clear and Coherent  Speech Volume: Decreased  Handedness: Right   Mood and Affect  Mood: Depressed; Dysphoric  Affect: Appropriate; Depressed; Flat   Thought Process  Thought Processes: Coherent  Descriptions of Associations:Intact  Orientation:Full (Time, Place and Person)  Thought Content:WDL; Logical  History of Schizophrenia/Schizoaffective disorder:No  Duration of Psychotic Symptoms:Greater than six months  Hallucinations:Hallucinations: None  Ideas of Reference:None  Suicidal Thoughts:Suicidal Thoughts: No  Homicidal Thoughts:Homicidal Thoughts: No   Sensorium  Memory: Immediate Fair; Remote Fair  Judgment: Intact  Insight: Good   Executive Functions  Concentration: Fair  Attention Span: Fair  Recall: [employer] of  Knowledge: Fair  Language: Good   Psychomotor Activity  Psychomotor Activity: Psychomotor Activity: Normal   Assets  Assets: Desire for Improvement; Communication Skills; Financial Resources/Insurance   Sleep  Sleep: Sleep:  Fair   Physical Exam: Physical Exam Vitals and nursing note reviewed.  Constitutional:      Appearance: Normal appearance.  HENT:     Head: Normocephalic and atraumatic.     Nose: Nose normal.     Mouth/Throat:     Mouth: Mucous membranes are dry.  Eyes:     Pupils: Pupils are equal, round, and reactive to light.  Pulmonary:     Effort: Pulmonary effort is normal.  Musculoskeletal:        General: Normal range of motion.     Cervical back: Normal range of motion.  Skin:    General: Skin is warm and dry.  Neurological:     Mental Status: He is alert and oriented to person, place, and time.  Psychiatric:        Attention and Perception: Attention and perception normal.        Mood and Affect: Mood is depressed. Affect is flat.        Speech: Speech normal.        Behavior: Behavior normal. Behavior is cooperative.        Thought Content: Thought content normal.        Cognition and Memory: Cognition and memory normal.        Judgment: Judgment is impulsive.    Review of Systems  Psychiatric/Behavioral:  Positive for depression and substance abuse. Negative for hallucinations and suicidal ideas.    Blood pressure 117/74, pulse 88, temperature 97.9 F (36.6 C), temperature source Oral, resp. rate 17, height 5\' 11"  (1.803 m), weight 106.2 kg, SpO2 95 %. Body mass index is 32.65 kg/m.  Treatment Plan Summary: Plan DC -in the AM  Disposition: No evidence of imminent risk to self or others at present.   Patient does not meet criteria for psychiatric inpatient admission. Supportive therapy provided about ongoing stressors. Refer to IOP. Discussed crisis plan, support from social network, calling 911, coming to the Emergency Department, and calling Suicide Hotline. Discharge in the AM  Deloria Lair, NP 01/03/2023 10:13 PM

## 2023-01-03 NOTE — ED Notes (Signed)
Pt continues to be agitated and trying to come out of his room- Dr Jori Moll aware- pt took Im meds willingly

## 2023-01-03 NOTE — ED Notes (Signed)
Pt out in the hall, wont stay in their room. They say there are voices in there. Officer and tech offered to turn the light or tv on but pt is declining. Pt asking if he's going to make it out of here alive. Pt very nervous and experiencing hallucinations. Not displaying any physical aggression, just won't stay in his room.

## 2023-01-03 NOTE — ED Notes (Signed)
IVC/Pending psych consult. 

## 2023-01-03 NOTE — ED Notes (Signed)
TTS speaking with patient. 

## 2023-01-03 NOTE — ED Notes (Addendum)
Pt dressed out by this RN & Angelina Sheriff, RN with security present belongings include:   Black hoodie Gray pants [employer] Black underwear [employer] 1 gray ring 1 black cell phone 1 black remote

## 2023-01-03 NOTE — ED Notes (Signed)
Attempted to talk with pt but pt will not speak. Appears to be having auditory hallucinations. Accepted dinner tray without issues. Currently calmly laying in  bed awake. Will continue to monitor.

## 2023-01-03 NOTE — ED Notes (Signed)
Pt given nighttime snack. 

## 2023-01-04 NOTE — ED Notes (Signed)
Pt given breakfast tray and beverage.  

## 2023-01-04 NOTE — ED Provider Notes (Signed)
Emergency Medicine Observation Re-evaluation Note  Jeremy Frank is a 37 y.o. male, seen on rounds today.  Pt initially presented to the ED for complaints of Psychiatric Evaluation   Physical Exam  BP 117/74 (BP Location: Left Arm)   Pulse 88   Temp 97.9 F (36.6 C) (Oral)   Resp 17   Ht 5\' 11"  (1.803 m)   Wt 106.2 kg   SpO2 95%   BMI 32.65 kg/m  Physical Exam General: NAD  ED Course / MDM  EKG:   I have reviewed the labs performed to date as well as medications administered while in observation.  Recent changes in the last 24 hours include .  Plan  The patient has been evaluated at bedside by psychiatry.  Patient is clinically stable.  Not felt to be a danger to self or others.  No SI or Hi.  No indication for inpatient psychiatric admission at this time.  Appropriate for continued outpatient therapy. Current plan is for DC in the today.    Merlyn Lot, MD 01/04/23 [phone]

## 2023-03-18 ENCOUNTER — Emergency Department: Payer: No Typology Code available for payment source

## 2023-03-18 ENCOUNTER — Other Ambulatory Visit: Payer: Self-pay

## 2023-03-18 ENCOUNTER — Emergency Department
Admission: EM | Admit: 2023-03-18 | Discharge: 2023-03-18 | Disposition: A | Discharge status: Home / Self Care | Payer: No Typology Code available for payment source | Attending: Emergency Medicine | Admitting: Emergency Medicine

## 2023-03-18 ENCOUNTER — Encounter: Payer: Self-pay | Admitting: Emergency Medicine

## 2023-03-18 DIAGNOSIS — R079 Chest pain, unspecified: Secondary | ICD-10-CM | POA: Insufficient documentation

## 2023-03-18 DIAGNOSIS — F1510 Other stimulant abuse, uncomplicated: Secondary | ICD-10-CM | POA: Insufficient documentation

## 2023-03-18 DIAGNOSIS — F1210 Cannabis abuse, uncomplicated: Secondary | ICD-10-CM | POA: Insufficient documentation

## 2023-03-18 DIAGNOSIS — F1590 Other stimulant use, unspecified, uncomplicated: Secondary | ICD-10-CM

## 2023-03-18 DIAGNOSIS — I129 Hypertensive chronic kidney disease with stage 1 through stage 4 chronic kidney disease, or unspecified chronic kidney disease: Secondary | ICD-10-CM | POA: Insufficient documentation

## 2023-03-18 DIAGNOSIS — N1831 Chronic kidney disease, stage 3a: Secondary | ICD-10-CM | POA: Insufficient documentation

## 2023-03-18 DIAGNOSIS — R000 Tachycardia, unspecified: Secondary | ICD-10-CM | POA: Insufficient documentation

## 2023-03-18 DIAGNOSIS — Z91010 Allergy to peanuts: Secondary | ICD-10-CM | POA: Insufficient documentation

## 2023-03-18 LAB — CBC
HCT: 44.5 % (ref 39.0–52.0)
Hemoglobin: 15 g/dL (ref 13.0–17.0)
MCH: 29.9 pg (ref 26.0–34.0)
MCHC: 33.7 g/dL (ref 30.0–36.0)
MCV: 88.6 fL (ref 80.0–100.0)
Platelets: 256 10*3/uL (ref 150–400)
RBC: 5.02 MIL/uL (ref 4.22–5.81)
RDW: 12.7 % (ref 11.5–15.5)
WBC: 11.2 10*3/uL — ABNORMAL HIGH (ref 4.0–10.5)
nRBC: 0 % (ref 0.0–0.2)

## 2023-03-18 LAB — URINE DRUG SCREEN, QUALITATIVE (ARMC ONLY)
Amphetamines, Ur Screen: POSITIVE — AB
Barbiturates, Ur Screen: NOT DETECTED
Benzodiazepine, Ur Scrn: NOT DETECTED
Cannabinoid 50 Ng, Ur ~~LOC~~: POSITIVE — AB
Cocaine Metabolite,Ur ~~LOC~~: NOT DETECTED
MDMA (Ecstasy)Ur Screen: NOT DETECTED
Methadone Scn, Ur: NOT DETECTED
Opiate, Ur Screen: NOT DETECTED
Phencyclidine (PCP) Ur S: NOT DETECTED
Tricyclic, Ur Screen: NOT DETECTED

## 2023-03-18 LAB — COMPREHENSIVE METABOLIC PANEL
ALT: 19 U/L (ref 0–44)
AST: 34 U/L (ref 15–41)
Albumin: 5 g/dL (ref 3.5–5.0)
Alkaline Phosphatase: 72 U/L (ref 38–126)
Anion gap: 8 (ref 5–15)
BUN: 15 mg/dL (ref 6–20)
CO2: 24 mmol/L (ref 22–32)
Calcium: 10.1 mg/dL (ref 8.9–10.3)
Chloride: 103 mmol/L (ref 98–111)
Creatinine, Ser: 1.44 mg/dL — ABNORMAL HIGH (ref 0.61–1.24)
GFR, Estimated: >60 mL/min (ref >60)
Glucose, Bld: 73 mg/dL (ref 70–99)
Potassium: 3.7 mmol/L (ref 3.5–5.1)
Sodium: 135 mmol/L (ref 135–145)
Total Bilirubin: 0.9 mg/dL (ref 0.3–1.2)
Total Protein: 9.4 g/dL — ABNORMAL HIGH (ref 6.5–8.1)

## 2023-03-18 LAB — ACETAMINOPHEN LEVEL: Acetaminophen (Tylenol), Serum: <10 ug/mL — ABNORMAL LOW (ref 10–30)

## 2023-03-18 LAB — TROPONIN I (HIGH SENSITIVITY)
Troponin I (High Sensitivity): 3 ng/L (ref <18)
Troponin I (High Sensitivity): 3 ng/L (ref <18)

## 2023-03-18 LAB — SALICYLATE LEVEL: Salicylate Lvl: <7 mg/dL — ABNORMAL LOW (ref 7.0–30.0)

## 2023-03-18 LAB — ETHANOL: Alcohol, Ethyl (B): <10 mg/dL (ref <10)

## 2023-03-18 MED ORDER — LORAZEPAM 2 MG/ML IJ SOLN
1.0000 mg | Freq: Once | INTRAMUSCULAR | Status: AC
  Administered 2023-03-18: 1 mg via INTRAVENOUS
  Filled 2023-03-18: qty 1

## 2023-03-18 MED ORDER — SODIUM CHLORIDE 0.9 % IV BOLUS
1000.0000 mL | Freq: Once | INTRAVENOUS | Status: AC
  Administered 2023-03-18: 1000 mL via INTRAVENOUS

## 2023-03-18 NOTE — ED Provider Notes (Signed)
Procedures  Clinical Course as of 03/18/23 1106  Wed Mar 18, 2023  [id number] Patient sleeping soundly in no acute distress.  Initial laboratory results including troponin unremarkable.  Chest x-ray is clear.  Will obtain time to repeat troponin.  Anticipate patient may be discharged home if negative.  Care will be transferred to the oncoming provider at change of shift. [JS]    Clinical Course User Index [JS] Paulette Blanch, MD    ----------------------------------------- 11:06 AM on 03/18/2023 ----------------------------------------- Patient awake and alert, calm.  Reports symptoms have resolved.  No SI HI or hallucinations.  Stable for discharge     Carrie Mew, MD 03/18/23 [phone]

## 2023-03-18 NOTE — ED Provider Notes (Signed)
[facility] Provider Note    Event Date/Time   First MD Initiated Contact with Patient 03/18/23 [phone]     (approximate)   History   Amphetamine use   HPI  Jeremy Frank is a 37 y.o. male to the ED via EMS from home with a chief complaint of chest pain and shortness of breath after first-time use of amphetamine.  Patient states he both smoked it and rock salt form as well as ate it accidentally 5 hours ago.  Denies headache, vision changes, cough, abdominal pain, nausea, vomiting or dizziness.     Past Medical History   Past Medical History:  Diagnosis Date   Drug dependence ([place])    GI bleed    Hiatal hernia    Hypertension    Migraine    PTSD (post-traumatic stress disorder)      Active Problem List   Patient Active Problem List   Diagnosis Date Noted   Aggressive behavior 01/03/2023   Cocaine abuse with cocaine-induced mood disorder ([place]) 10/11/2022   Polysubstance abuse ([place]) 02/10/2022   Depression 02/10/2022   Alcohol abuse 02/10/2022   Tobacco abuse 02/10/2022   HTN (hypertension) 02/10/2022   Lung nodules 02/10/2022   GERD (gastroesophageal reflux disease) 02/10/2022   Chronic kidney disease, stage 3a ([place]) 02/10/2022   Migraine 02/10/2022   Cannabis dependence, continuous ([place]) 06/18/2021   Alcohol abuse with alcohol-induced mood disorder ([place]) 06/18/2021   Crohn's disease ([place]) 06/18/2021   Peptic ulcer 06/18/2021   Severe alcohol dependence ([place]) 06/18/2021   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 Authorizing Provider  acamprosate (CAMPRAL) 333 MG tablet Take 666 mg by mouth  3 (three) times daily.    [provider]  divalproex (DEPAKOTE) 500 MG DR tablet Take 1 tablet (500 mg total) by mouth every 12 (twelve) hours. Patient not taking: Reported on 04/01/2022 02/12/22   Clapacs, Madie Reno, MD  FLUoxetine (PROZAC) 20 MG capsule Take 20 mg by mouth daily. 06/14/22   [provider]  gabapentin (NEURONTIN) 300 MG capsule Take 1 capsule (300 mg total) by mouth 3 (three) times daily. Patient not taking: Reported on 04/01/2022 02/12/22   Clapacs, Madie Reno, MD  haloperidol (HALDOL) 2 MG tablet Take 2 mg by mouth 2 (two) times daily. Patient not taking: Reported on 09/27/2022    [provider]  hydrOXYzine (ATARAX) 50 MG tablet Take 1 tablet (50 mg total) by mouth every 4 (four) hours as needed for anxiety. Patient not taking: Reported on 09/27/2022 02/12/22   Clapacs, Madie Reno, MD  naltrexone (DEPADE) 50 MG tablet Take 25 mg by mouth daily.    [provider]  OLANZapine (ZYPREXA) 5 MG tablet Take 1 tablet (5 mg total) by mouth at bedtime. Patient not taking: Reported on 09/27/2022 02/12/22   Clapacs, Madie Reno, MD  paliperidone (INVEGA SUSTENNA) 234 MG/1.5ML injection INJECT 234MG  INTRAMUSCULARLY ONCE NOT FOR PT TO ADMINISTER FACILTY WILL ADMINISTER PER PROVIDER AGREEMENT PT TRAVELING WITH MEDICATION TO NEW STRUCTRURED RESIDENCE WHO WILL HOLD/STORE AND ADMINISTER WHEN DUE 11/10/22   [provider]  pantoprazole (PROTONIX) 40 MG tablet  Take 2 tablets (80 mg total) by mouth daily. Patient not taking: Reported on 09/27/2022 02/12/22   Clapacs, Madie Reno, MD  risperidone (RISPERDAL) 4 MG tablet Take 4 mg by mouth in the morning.    [provider]  thiamine (VITAMIN B1) 100 MG tablet Take 100 mg by mouth daily.    [provider]  traZODone (DESYREL) 100 MG tablet Take 1 tablet (100 mg total) by mouth at bedtime as needed for sleep. Patient not taking: Reported on 04/01/2022 02/12/22   Clapacs, Madie Reno, MD     Allergies  Peanut-containing  drug products, Cheese, and Egg-derived products   Family History   Family History  Problem Relation Age of Onset   Heart failure Mother    Hypertension Father      Physical Exam  Triage Vital Signs: ED Triage Vitals  Enc Vitals Group     BP      Pulse      Resp      Temp      Temp src      SpO2      Weight      Height      Head Circumference      Peak Flow      Pain Score      Pain Loc      Pain Edu?      Excl. in [place]?     Updated Vital Signs: BP (!) 180/114 (BP Location: Right Arm)   Pulse (!) 107   Temp 97.6 F (36.4 C) (Oral)   Resp (!) 24   Wt 106 kg   SpO2 100%   BMI 32.59 kg/m    General: Awake, no distress.  CV:  Tachycardic.  Good peripheral perfusion.  Resp:  Increased effort.  CTAB. Abd:  Nontender to light or deep palpation.  No distention.  Other:  Bilateral calves are supple and nontender.   ED Results / Procedures / Treatments  Labs (all labs ordered are listed, but only abnormal results are displayed) Labs Reviewed  CBC - Abnormal; Notable for the following components:      Result Value   WBC 11.2 (*)    All other components within normal limits  COMPREHENSIVE METABOLIC PANEL - Abnormal; Notable for the following components:   Creatinine, Ser 1.44 (*)    Total Protein 9.4 (*)    All other components within normal limits  ACETAMINOPHEN LEVEL - Abnormal; Notable for the following components:   Acetaminophen (Tylenol), Serum <10 (*)    All other components within normal limits  SALICYLATE LEVEL - Abnormal; Notable for the following components:   Salicylate Lvl [id number] (*)    All other components within normal limits  ETHANOL  URINE DRUG SCREEN, QUALITATIVE ([facility] ONLY)  TROPONIN I (HIGH SENSITIVITY)  TROPONIN I (HIGH SENSITIVITY)     EKG  ED ECG REPORT I, Quinn Bartling J, the attending physician, personally viewed and interpreted this ECG.   Date: 03/18/2023  EKG Time: 0502  Rate: 102  Rhythm: sinus tachycardia  Axis: Normal   Intervals:none  ST&T Change: Nonspecific    RADIOLOGY I have independently visualized and interpreted patient's x-ray as well as noted the radiology interpretation:  Chest x-ray: No acute cardiopulmonary process  Official radiology report(s): DG Chest Port 1 View  Result Date: 03/18/2023 CLINICAL DATA:  37 year old male with history of chest pain after smoking methamphetamine. EXAM: PORTABLE CHEST 1 VIEW COMPARISON:  Chest x-ray 09/27/2022. FINDINGS: Lung volumes are normal.  No consolidative airspace disease. No pleural effusions. No pneumothorax. No pulmonary nodule or mass noted. Pulmonary vasculature and the cardiomediastinal silhouette are within normal limits. IMPRESSION: No radiographic evidence of acute cardiopulmonary disease. Electronically Signed   By: Vinnie Langton M.D.   On: 03/18/2023 05:43     PROCEDURES:  Critical Care performed: No  .1-3 Lead EKG Interpretation  Performed by: Paulette Blanch, MD Authorized by: Paulette Blanch, MD     Interpretation: abnormal     ECG rate:  102   ECG rate assessment: tachycardic     Rhythm: sinus tachycardia     Ectopy: none     Conduction: normal   Comments:     Patient placed on cardiac monitor to evaluate for arrhythmias    MEDICATIONS ORDERED IN ED: Medications  sodium chloride 0.9 % bolus 1,000 mL (0 mLs Intravenous Stopped 03/18/23 0629)  LORazepam (ATIVAN) injection 1 mg (1 mg Intravenous Given 03/18/23 0506)     IMPRESSION / MDM / ASSESSMENT AND PLAN / ED COURSE  I reviewed the triage vital signs and the nursing notes.                             37 year old male presenting with chest pain shortness of breath after using amphetamines. Differential diagnosis includes, but is not limited to, ACS, aortic dissection, pulmonary embolism, cardiac tamponade, pneumothorax, pneumonia, pericarditis, myocarditis, GI-related causes including esophagitis/gastritis, and musculoskeletal chest wall pain.   I personally  reviewed patient's records and note multiple psychiatric hospitalizations.  Patient's presentation is most consistent with acute presentation with potential threat to life or bodily function.  The patient is on the cardiac monitor to evaluate for evidence of arrhythmia and/or significant heart rate changes.  Will obtain toxicological and cardiac panel, chest x-ray.  Initiate IV fluid resuscitation, administer IV Ativan for calming.  Will reassess.  Clinical Course as of 03/18/23 0656  Wed Mar 18, 2023  0629 Patient sleeping soundly in no acute distress.  Initial laboratory results including troponin unremarkable.  Chest x-ray is clear.  Will obtain time to repeat troponin.  Anticipate patient may be discharged home if negative.  Care will be transferred to the oncoming provider at change of shift. [JS]    Clinical Course User Index [JS] Paulette Blanch, MD     FINAL CLINICAL IMPRESSION(S) / ED DIAGNOSES   Final diagnoses:  Nonspecific chest pain  Amphetamine use     Rx / DC Orders   ED Discharge Orders     None        Note:  This document was prepared using Dragon voice recognition software and may include unintentional dictation errors.   Paulette Blanch, MD 03/18/23 [phone]

## 2023-03-18 NOTE — ED Triage Notes (Signed)
Presents via EMS for CP and SOB after first time use of amphetamine (smoked in "rock salt" form) 5 hrs ago.  Denies SI/HI  164/108, 100%RA, 110bpm

## 2023-03-18 NOTE — ED Notes (Addendum)
Aware of need for urine sample for UA, urinal at bedside

## 2023-05-07 ENCOUNTER — Emergency Department: Payer: No Typology Code available for payment source

## 2023-05-07 ENCOUNTER — Encounter: Payer: Self-pay | Admitting: Emergency Medicine

## 2023-05-07 DIAGNOSIS — R079 Chest pain, unspecified: Secondary | ICD-10-CM | POA: Diagnosis not present

## 2023-05-07 DIAGNOSIS — R000 Tachycardia, unspecified: Secondary | ICD-10-CM | POA: Insufficient documentation

## 2023-05-07 DIAGNOSIS — R072 Precordial pain: Secondary | ICD-10-CM | POA: Diagnosis present

## 2023-05-07 DIAGNOSIS — I10 Essential (primary) hypertension: Secondary | ICD-10-CM | POA: Insufficient documentation

## 2023-05-07 LAB — CBC
HCT: 38.8 % — ABNORMAL LOW (ref 39.0–52.0)
Hemoglobin: 12.9 g/dL — ABNORMAL LOW (ref 13.0–17.0)
MCH: 29.9 pg (ref 26.0–34.0)
MCHC: 33.2 g/dL (ref 30.0–36.0)
MCV: 89.8 fL (ref 80.0–100.0)
Platelets: 229 10*3/uL (ref 150–400)
RBC: 4.32 MIL/uL (ref 4.22–5.81)
RDW: 13.1 % (ref 11.5–15.5)
WBC: 10.9 10*3/uL — ABNORMAL HIGH (ref 4.0–10.5)
nRBC: 0 % (ref 0.0–0.2)

## 2023-05-07 LAB — BASIC METABOLIC PANEL
Anion gap: 10 (ref 5–15)
BUN: 9 mg/dL (ref 6–20)
CO2: 22 mmol/L (ref 22–32)
Calcium: 8.9 mg/dL (ref 8.9–10.3)
Chloride: 103 mmol/L (ref 98–111)
Creatinine, Ser: 1.3 mg/dL — ABNORMAL HIGH (ref 0.61–1.24)
GFR, Estimated: >60 mL/min (ref >60)
Glucose, Bld: 111 mg/dL — ABNORMAL HIGH (ref 70–99)
Potassium: 3.1 mmol/L — ABNORMAL LOW (ref 3.5–5.1)
Sodium: 135 mmol/L (ref 135–145)

## 2023-05-07 LAB — TROPONIN I (HIGH SENSITIVITY): Troponin I (High Sensitivity): 5 ng/L (ref <18)

## 2023-05-07 NOTE — ED Triage Notes (Signed)
Pt presents ambulatory to triage via POV with complaints of CP with associated HTN and headache for the last several days. Pt received his amlodipine today but has not started them as of yet. Rates the pain 5/10. A&Ox4 at this time. Denies dizziness, vision changes, or SOB.

## 2023-05-08 ENCOUNTER — Emergency Department
Admission: EM | Admit: 2023-05-08 | Discharge: 2023-05-08 | Disposition: A | Discharge status: Home / Self Care | Payer: No Typology Code available for payment source | Attending: Emergency Medicine | Admitting: Emergency Medicine

## 2023-05-08 DIAGNOSIS — I10 Essential (primary) hypertension: Secondary | ICD-10-CM

## 2023-05-08 DIAGNOSIS — R079 Chest pain, unspecified: Secondary | ICD-10-CM

## 2023-05-08 MED ORDER — HYDRALAZINE HCL 20 MG/ML IJ SOLN: 20.0000 mg | Freq: Once | INTRAMUSCULAR | Status: DC

## 2023-05-08 NOTE — Discharge Instructions (Addendum)
Please use your amlodipine as needed. Please take your blood pressure every morning at least 1 hour after waking up If your systolic blood pressure (top number) is below 150 or diastolic blood pressure (bottom number) is below 90, please do not take your amlodipine If similar symptoms develop including chest pain, headache, and hypertension, please take your amlodipine

## 2023-05-08 NOTE — ED Provider Notes (Signed)
[facility] Provider Note   Event Date/Time   First MD Initiated Contact with Patient 05/08/23 [phone]     (approximate) History  Chest Pain  HPI Jeremy Frank is a 37 y.o. male with a stated past medical history of hypertension who presents complaining of sharp substernal chest pain that does not radiate and is associated with hypertension.  Patient states that his blood pressure has been as high as the 200s systolics and usually correlates with significant chest pain.  Patient states that his chest pain has improved as his blood pressure has returned to normal.  Patient states there is been no change in his antihypertensive medications and that he has been taking them on time and as prescribed. ROS: Patient currently denies any vision changes, tinnitus, difficulty speaking, facial droop, sore throat, shortness of breath, abdominal pain, nausea/vomiting/diarrhea, dysuria, or weakness/numbness/paresthesias in any extremity   Physical Exam  Triage Vital Signs: ED Triage Vitals  Enc Vitals Group     BP 05/07/23 2028 (!) 180/90     Pulse Rate 05/07/23 2028 (!) 103     Resp 05/07/23 2028 18     Temp 05/07/23 2028 98.6 F (37 C)     Temp Source 05/07/23 2028 Oral     SpO2 05/07/23 2028 100 %     Weight 05/07/23 2035 236 lb 5.3 oz (107.2 kg)     Height 05/07/23 2035 5\' 11"  (1.803 m)     Head Circumference --      Peak Flow --      Pain Score 05/07/23 2035 5     Pain Loc --      Pain Edu? --      Excl. in [place]? --    Most recent vital signs: Vitals:   05/08/23 0345 05/08/23 0418  BP: 129/87   Pulse: 62   Resp: 17   Temp:  97.7 F (36.5 C)  SpO2: 100%    General: Awake, oriented x4. CV:  Good peripheral perfusion.  Resp:  Normal effort.  Abd:  No distention.  Other:  Overweight African-American middle-aged male laying in bed in no acute distress ED Results / Procedures / Treatments  Labs (all labs ordered are listed, but only abnormal results are  displayed) Labs Reviewed  BASIC METABOLIC PANEL - Abnormal; Notable for the following components:      Result Value   Potassium 3.1 (*)    Glucose, Bld 111 (*)    Creatinine, Ser 1.30 (*)    All other components within normal limits  CBC - Abnormal; Notable for the following components:   WBC 10.9 (*)    Hemoglobin 12.9 (*)    HCT 38.8 (*)    All other components within normal limits  TROPONIN I (HIGH SENSITIVITY)   EKG ED ECG REPORT I, Merwyn Katos, the attending physician, personally viewed and interpreted this ECG. Date: 05/08/2023 EKG Time: 2029 Rate: 110 Rhythm: Tachycardic sinus rhythm QRS Axis: normal Intervals: normal ST/T Wave abnormalities: normal Narrative Interpretation: Tachycardic sinus rhythm.  No evidence of acute ischemia RADIOLOGY ED MD interpretation: 2 view chest x-ray interpreted by me shows no evidence of acute abnormalities including no pneumonia, pneumothorax, or widened mediastinum -Agree with radiology assessment Official radiology report(s): No results found. PROCEDURES: Critical Care performed: No .1-3 Lead EKG Interpretation  Performed by: Merwyn Katos, MD Authorized by: Merwyn Katos, MD     Interpretation: normal     ECG rate:  71  ECG rate assessment: normal     Rhythm: sinus rhythm     Ectopy: none     Conduction: normal    MEDICATIONS ORDERED IN ED: Medications - No data to display IMPRESSION / MDM / ASSESSMENT AND PLAN / ED COURSE  I reviewed the triage vital signs and the nursing notes.                             The patient is on the cardiac monitor to evaluate for evidence of arrhythmia and/or significant heart rate changes. Patient's presentation is most consistent with acute presentation with potential threat to life or bodily function. Presents to the emergency department complaining of high blood pressure and chest pain. Patient is otherwise asymptomatic without confusion, hematuria, or SOB. Denies nonadherence  to antihypertensive regimen DDx: CV, AMI, heart failure, renal infarction or failure or other end organ damage. EKG shows no signs of ischemia and troponin negative, chest x-ray showing no evidence of acute abnormalities Disposition: Discussed with patient their elevated blood pressure and need for close outpatient management of their hypertension. Will provide a prescription for the patients previous antihypertensive medication and arrange for the patient to follow up in a primary care clinic    FINAL CLINICAL IMPRESSION(S) / ED DIAGNOSES   Final diagnoses:  Chest pain, unspecified type  Uncontrolled hypertension   Rx / DC Orders   ED Discharge Orders     None      Note:  This document was prepared using Dragon voice recognition software and may include unintentional dictation errors.   Merwyn Katos, MD 05/09/23 [phone]

## 2023-06-13 ENCOUNTER — Encounter: Payer: Self-pay | Admitting: Emergency Medicine

## 2023-06-13 ENCOUNTER — Other Ambulatory Visit: Payer: Self-pay

## 2023-06-13 ENCOUNTER — Emergency Department
Admission: EM | Admit: 2023-06-13 | Discharge: 2023-06-13 | Disposition: A | Discharge status: Home / Self Care | Payer: No Typology Code available for payment source | Attending: Emergency Medicine | Admitting: Emergency Medicine

## 2023-06-13 DIAGNOSIS — R079 Chest pain, unspecified: Secondary | ICD-10-CM

## 2023-06-13 DIAGNOSIS — Z79899 Other long term (current) drug therapy: Secondary | ICD-10-CM | POA: Insufficient documentation

## 2023-06-13 DIAGNOSIS — F1510 Other stimulant abuse, uncomplicated: Secondary | ICD-10-CM | POA: Diagnosis not present

## 2023-06-13 DIAGNOSIS — I10 Essential (primary) hypertension: Secondary | ICD-10-CM | POA: Diagnosis not present

## 2023-06-13 DIAGNOSIS — R000 Tachycardia, unspecified: Secondary | ICD-10-CM | POA: Insufficient documentation

## 2023-06-13 DIAGNOSIS — F419 Anxiety disorder, unspecified: Secondary | ICD-10-CM | POA: Diagnosis not present

## 2023-06-13 MED ORDER — HYDROCHLOROTHIAZIDE 12.5 MG PO TABS: 12.5000 mg | ORAL_TABLET | Freq: Every day | ORAL | 0 refills | Status: DC

## 2023-06-13 MED ORDER — HYDROCHLOROTHIAZIDE 25 MG PO TABS
25.0000 mg | ORAL_TABLET | Freq: Once | ORAL | Status: AC
  Administered 2023-06-13: 25 mg via ORAL
  Filled 2023-06-13: qty 1

## 2023-06-13 MED ORDER — LORAZEPAM 2 MG PO TABS
2.0000 mg | ORAL_TABLET | Freq: Once | ORAL | Status: AC
  Administered 2023-06-13: 2 mg via ORAL
  Filled 2023-06-13: qty 1

## 2023-06-13 MED ORDER — HYDROCHLOROTHIAZIDE 12.5 MG PO TABS: 12.5000 mg | ORAL_TABLET | Freq: Every day | ORAL | 0 refills | Status: AC

## 2023-06-13 MED ORDER — AMLODIPINE BESYLATE 5 MG PO TABS
5.0000 mg | ORAL_TABLET | Freq: Once | ORAL | Status: AC
  Administered 2023-06-13: 5 mg via ORAL
  Filled 2023-06-13: qty 1

## 2023-06-13 MED ORDER — OLANZAPINE 5 MG PO TBDP
5.0000 mg | ORAL_TABLET | Freq: Once | ORAL | Status: AC
  Administered 2023-06-13: 5 mg via ORAL
  Filled 2023-06-13: qty 1

## 2023-06-13 NOTE — ED Triage Notes (Signed)
Pt presents ambulatory to triage via [facility]  with complaints of controlled HTN. Per EMS, the patient has been taking his medications as prescribed. Pt declined labs in triage and is anxious due to his elevated BP. A&Ox4 at this time. Denies CP or SOB.

## 2023-06-13 NOTE — ED Provider Notes (Signed)
[facility] Provider Note   Event Date/Time   First MD Initiated Contact with Patient 06/13/23 0507     (approximate) History  Hypertension  HPI Jeremy Frank is a 37 y.o. male with a stated past medical history of methamphetamine abuse and uncontrolled hypertension who presents complaining of chest pain and hypertension as well as anxiety.  Patient states that he used methamphetamine just prior to arrival.  Patient states that these are similar symptoms when he has used methamphetamine in the past ROS: Patient currently denies any vision changes, tinnitus, difficulty speaking, facial droop, sore throat, shortness of breath, abdominal pain, nausea/vomiting/diarrhea, dysuria, or weakness/numbness/paresthesias in any extremity   Physical Exam  Triage Vital Signs: ED Triage Vitals  Enc Vitals Group     BP 06/13/23 0454 (!) 176/114     Pulse Rate 06/13/23 0454 (!) 119     Resp 06/13/23 0454 20     Temp 06/13/23 0454 98.8 F (37.1 C)     Temp Source 06/13/23 0454 Oral     SpO2 06/13/23 0454 98 %     Weight 06/13/23 0457 238 lb 8.6 oz (108.2 kg)     Height 06/13/23 0457 5\' 11"  (1.803 m)     Head Circumference --      Peak Flow --      Pain Score 06/13/23 0457 5     Pain Loc --      Pain Edu? --      Excl. in [place]? --    Most recent vital signs: Vitals:   06/13/23 0610 06/13/23 0653  BP: (!) 153/107 (!) 147/104  Pulse:  (!) 102  Resp:  20  Temp:  98.4 F (36.9 C)  SpO2:  99%   General: Awake, oriented x4. CV:  Good peripheral perfusion.  Resp:  Normal effort.  Abd:  No distention.  Other:  Middle-aged overweight African-American male laying in bed in no acute distress ED Results / Procedures / Treatments  Labs (all labs ordered are listed, but only abnormal results are displayed) Labs Reviewed - No data to display EKG ED ECG REPORT I, Merwyn Katos, the attending physician, personally viewed and interpreted this ECG. Date: 06/13/2023 EKG  Time: 0455 Rate: 118 Rhythm: Tachycardic sinus rhythm QRS Axis: normal Intervals: normal ST/T Wave abnormalities: normal Narrative Interpretation: no evidence of acute ischemia PROCEDURES: Critical Care performed: No .1-3 Lead EKG Interpretation  Performed by: Merwyn Katos, MD Authorized by: Merwyn Katos, MD     Interpretation: normal     ECG rate:  91   ECG rate assessment: normal     Rhythm: sinus rhythm     Ectopy: none     Conduction: normal    MEDICATIONS ORDERED IN ED: Medications  LORazepam (ATIVAN) tablet 2 mg (2 mg Oral Given 06/13/23 0519)  amLODipine (NORVASC) tablet 5 mg (5 mg Oral Given 06/13/23 0519)  hydrochlorothiazide (HYDRODIURIL) tablet 25 mg (25 mg Oral Given 06/13/23 0606)  OLANZapine zydis (ZYPREXA) disintegrating tablet 5 mg (5 mg Oral Given 06/13/23 0606)   IMPRESSION / MDM / ASSESSMENT AND PLAN / ED COURSE  I reviewed the triage vital signs and the nursing notes.                             The patient is on the cardiac monitor to evaluate for evidence of arrhythmia and/or significant heart rate changes. Patient's presentation is most consistent with  acute presentation with potential threat to life or bodily function. Presents to the emergency department complaining of high blood pressure, chest pain, and anxiety secondary to methamphetamine abuse. Patient is otherwise asymptomatic without confusion, chest pain, hematuria, or SOB. Endorses nonadherence to antihypertensive regimen DDx: CV, AMI, heart failure, renal infarction or failure or other end organ damage.  Disposition: Discussed with patient their elevated blood pressure and need for close outpatient management of their hypertension. Will provide a prescription for hydrochlorothiazide 12.5 mg PO daily and arrange for the patient to follow up in a primary care clinic   FINAL CLINICAL IMPRESSION(S) / ED DIAGNOSES   Final diagnoses:  Uncontrolled hypertension  Chest pain, unspecified type   Methamphetamine abuse (HCC)   Rx / DC Orders   ED Discharge Orders          Ordered    hydrochlorothiazide (HYDRODIURIL) 12.5 MG tablet  Daily        06/13/23 0604    hydrochlorothiazide (HYDRODIURIL) 12.5 MG tablet  Daily        06/13/23 1610           Note:  This document was prepared using Dragon voice recognition software and may include unintentional dictation errors.   Merwyn Katos, MD 06/13/23 [phone]

## 2023-08-29 ENCOUNTER — Other Ambulatory Visit: Payer: Self-pay

## 2023-08-29 ENCOUNTER — Emergency Department
Admission: EM | Admit: 2023-08-29 | Discharge: 2023-08-29 | Discharge status: Against Medical Advice | Payer: No Typology Code available for payment source | Attending: Emergency Medicine | Admitting: Emergency Medicine

## 2023-08-29 ENCOUNTER — Emergency Department: Payer: No Typology Code available for payment source

## 2023-08-29 DIAGNOSIS — R0789 Other chest pain: Secondary | ICD-10-CM | POA: Diagnosis present

## 2023-08-29 DIAGNOSIS — Z5321 Procedure and treatment not carried out due to patient leaving prior to being seen by health care provider: Secondary | ICD-10-CM | POA: Diagnosis not present

## 2023-08-29 LAB — CBC
HCT: 41.6 % (ref 39.0–52.0)
Hemoglobin: 14.4 g/dL (ref 13.0–17.0)
MCH: 29.3 pg (ref 26.0–34.0)
MCHC: 34.6 g/dL (ref 30.0–36.0)
MCV: 84.6 fL (ref 80.0–100.0)
Platelets: 273 10*3/uL (ref 150–400)
RBC: 4.92 MIL/uL (ref 4.22–5.81)
RDW: 12.6 % (ref 11.5–15.5)
WBC: 11.8 10*3/uL — ABNORMAL HIGH (ref 4.0–10.5)
nRBC: 0 % (ref 0.0–0.2)

## 2023-08-29 LAB — BASIC METABOLIC PANEL
Anion gap: 15 (ref 5–15)
BUN: 16 mg/dL (ref 6–20)
CO2: 20 mmol/L — ABNORMAL LOW (ref 22–32)
Calcium: 9.6 mg/dL (ref 8.9–10.3)
Chloride: 102 mmol/L (ref 98–111)
Creatinine, Ser: 1.45 mg/dL — ABNORMAL HIGH (ref 0.61–1.24)
GFR, Estimated: >60 mL/min (ref >60)
Glucose, Bld: 107 mg/dL — ABNORMAL HIGH (ref 70–99)
Potassium: 3.4 mmol/L — ABNORMAL LOW (ref 3.5–5.1)
Sodium: 137 mmol/L (ref 135–145)

## 2023-08-29 LAB — TROPONIN I (HIGH SENSITIVITY): Troponin I (High Sensitivity): 7 ng/L (ref <18)

## 2023-08-29 NOTE — ED Triage Notes (Signed)
Pt AOX4, skin warm dry and pink, respirations even and unlabored, NAD noted. Pt reports chest tightness/pressure at this time.

## 2023-08-29 NOTE — ED Triage Notes (Signed)
First nurse note:  Pt has had ETOH and marijuana use 173/134 with HX of same and is medicated.  Pt admits to using cocaine use last night as well.  Pt complaint of chest pain.

## 2023-09-20 ENCOUNTER — Emergency Department (HOSPITAL_COMMUNITY)
Admission: EM | Admit: 2023-09-20 | Discharge: 2023-09-21 | Disposition: A | Discharge status: Home / Self Care | Payer: No Typology Code available for payment source | Attending: Emergency Medicine | Admitting: Emergency Medicine

## 2023-09-20 ENCOUNTER — Other Ambulatory Visit: Payer: Self-pay

## 2023-09-20 ENCOUNTER — Emergency Department (HOSPITAL_COMMUNITY): Payer: No Typology Code available for payment source

## 2023-09-20 DIAGNOSIS — S01112A Laceration without foreign body of left eyelid and periocular area, initial encounter: Secondary | ICD-10-CM | POA: Insufficient documentation

## 2023-09-20 DIAGNOSIS — D72829 Elevated white blood cell count, unspecified: Secondary | ICD-10-CM | POA: Diagnosis not present

## 2023-09-20 DIAGNOSIS — Z91010 Allergy to peanuts: Secondary | ICD-10-CM | POA: Insufficient documentation

## 2023-09-20 DIAGNOSIS — N183 Chronic kidney disease, stage 3 unspecified: Secondary | ICD-10-CM | POA: Diagnosis not present

## 2023-09-20 DIAGNOSIS — M542 Cervicalgia: Secondary | ICD-10-CM | POA: Insufficient documentation

## 2023-09-20 DIAGNOSIS — R519 Headache, unspecified: Secondary | ICD-10-CM | POA: Diagnosis present

## 2023-09-20 DIAGNOSIS — S0181XA Laceration without foreign body of other part of head, initial encounter: Secondary | ICD-10-CM | POA: Diagnosis not present

## 2023-09-20 DIAGNOSIS — S01511A Laceration without foreign body of lip, initial encounter: Secondary | ICD-10-CM | POA: Diagnosis not present

## 2023-09-20 LAB — COMPREHENSIVE METABOLIC PANEL
ALT: 31 U/L (ref 0–44)
AST: 47 U/L — ABNORMAL HIGH (ref 15–41)
Albumin: 3.6 g/dL (ref 3.5–5.0)
Alkaline Phosphatase: 64 U/L (ref 38–126)
Anion gap: 10 (ref 5–15)
BUN: 15 mg/dL (ref 6–20)
CO2: 20 mmol/L — ABNORMAL LOW (ref 22–32)
Calcium: 8.3 mg/dL — ABNORMAL LOW (ref 8.9–10.3)
Chloride: 109 mmol/L (ref 98–111)
Creatinine, Ser: 1.6 mg/dL — ABNORMAL HIGH (ref 0.61–1.24)
GFR, Estimated: 56 mL/min — ABNORMAL LOW (ref >60)
Glucose, Bld: 73 mg/dL (ref 70–99)
Potassium: 3.7 mmol/L (ref 3.5–5.1)
Sodium: 139 mmol/L (ref 135–145)
Total Bilirubin: 0.8 mg/dL (ref 0.3–1.2)
Total Protein: 6.1 g/dL — ABNORMAL LOW (ref 6.5–8.1)

## 2023-09-20 LAB — ETHANOL: Alcohol, Ethyl (B): 47 mg/dL — ABNORMAL HIGH (ref <10)

## 2023-09-20 LAB — CBC
HCT: 39.3 % (ref 39.0–52.0)
Hemoglobin: 12.3 g/dL — ABNORMAL LOW (ref 13.0–17.0)
MCH: 29.2 pg (ref 26.0–34.0)
MCHC: 31.3 g/dL (ref 30.0–36.0)
MCV: 93.3 fL (ref 80.0–100.0)
Platelets: 175 10*3/uL (ref 150–400)
RBC: 4.21 MIL/uL — ABNORMAL LOW (ref 4.22–5.81)
RDW: 13.4 % (ref 11.5–15.5)
WBC: 14.7 10*3/uL — ABNORMAL HIGH (ref 4.0–10.5)
nRBC: 0 % (ref 0.0–0.2)

## 2023-09-20 LAB — I-STAT CHEM 8, ED
BUN: 21 mg/dL — ABNORMAL HIGH (ref 6–20)
Calcium, Ion: 1 mmol/L — ABNORMAL LOW (ref 1.15–1.40)
Chloride: 109 mmol/L (ref 98–111)
Creatinine, Ser: 1.8 mg/dL — ABNORMAL HIGH (ref 0.61–1.24)
Glucose, Bld: 71 mg/dL (ref 70–99)
HCT: 39 % (ref 39.0–52.0)
Hemoglobin: 13.3 g/dL (ref 13.0–17.0)
Potassium: 4 mmol/L (ref 3.5–5.1)
Sodium: 141 mmol/L (ref 135–145)
TCO2: 21 mmol/L — ABNORMAL LOW (ref 22–32)

## 2023-09-20 LAB — PROTIME-INR
INR: 0.9 (ref 0.8–1.2)
Prothrombin Time: 12.3 seconds (ref 11.4–15.2)

## 2023-09-20 LAB — I-STAT CG4 LACTIC ACID, ED: Lactic Acid, Venous: 3 mmol/L (ref 0.5–1.9)

## 2023-09-20 LAB — SAMPLE TO BLOOD BANK

## 2023-09-20 MED ORDER — FENTANYL CITRATE PF 50 MCG/ML IJ SOSY
50.0000 ug | PREFILLED_SYRINGE | Freq: Once | INTRAMUSCULAR | Status: AC
  Administered 2023-09-20: 50 ug via INTRAVENOUS
  Filled 2023-09-20: qty 1
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
## 2023-09-21 LAB — URINALYSIS, ROUTINE W REFLEX MICROSCOPIC
Bacteria, UA: NONE SEEN
Bilirubin Urine: NEGATIVE
Glucose, UA: NEGATIVE mg/dL
Hgb urine dipstick: NEGATIVE
Ketones, ur: 5 mg/dL — AB
Leukocytes,Ua: NEGATIVE
Nitrite: NEGATIVE
Protein, ur: 30 mg/dL — AB
Specific Gravity, Urine: 1.019 (ref 1.005–1.030)
pH: 5 (ref 5.0–8.0)

## 2023-09-21 LAB — RAPID URINE DRUG SCREEN, HOSP PERFORMED
Amphetamines: POSITIVE — AB
Barbiturates: NOT DETECTED
Benzodiazepines: POSITIVE — AB
Cocaine: NOT DETECTED
Opiates: NOT DETECTED
Tetrahydrocannabinol: POSITIVE — AB

## 2023-09-21 MED ORDER — LIDOCAINE-EPINEPHRINE-TETRACAINE (LET) TOPICAL GEL
3.0000 mL | Freq: Once | TOPICAL | Status: AC
  Administered 2023-09-21: 3 mL via TOPICAL
  Filled 2023-09-21: qty 3

## 2023-09-21 MED ORDER — MORPHINE SULFATE (PF) 4 MG/ML IV SOLN
4.0000 mg | Freq: Once | INTRAVENOUS | Status: AC
  Administered 2023-09-21: 4 mg via INTRAVENOUS
  Filled 2023-09-21: qty 1

## 2023-09-21 MED ORDER — HYDROMORPHONE HCL 1 MG/ML IJ SOLN
0.5000 mg | Freq: Once | INTRAMUSCULAR | Status: AC
  Administered 2023-09-21: 0.5 mg via INTRAVENOUS
  Filled 2023-09-21: qty 1

## 2023-09-21 MED ORDER — ONDANSETRON HCL 4 MG/2ML IJ SOLN
4.0000 mg | Freq: Once | INTRAMUSCULAR | Status: AC
  Administered 2023-09-21: 4 mg via INTRAVENOUS
  Filled 2023-09-21: qty 2
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
## 2023-09-21 NOTE — ED Notes (Signed)
Patient ambulated unassisted to end of the hall and back. Patient then went to the bathroom. Provider made aware.
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
## 2023-09-21 NOTE — Progress Notes (Signed)
Orthopedic Tech Progress Note Patient Details:  Jeremy Frank 1986/04/14 [id number]  Patient ID: Jeremy Frank, male   DOB: 1986-03-17, 37 y.o.   MRN: [id number] I attended trauma page Trinna Post 09/21/2023, 12:22 AM

## 2023-10-07 ENCOUNTER — Other Ambulatory Visit: Payer: Self-pay

## 2023-10-07 ENCOUNTER — Emergency Department
Admission: EM | Admit: 2023-10-07 | Discharge: 2023-10-09 | Disposition: A | Discharge status: Home / Self Care | Payer: No Typology Code available for payment source | Attending: Emergency Medicine | Admitting: Emergency Medicine

## 2023-10-07 ENCOUNTER — Encounter: Payer: Self-pay | Admitting: *Deleted

## 2023-10-07 DIAGNOSIS — F201 Disorganized schizophrenia: Secondary | ICD-10-CM

## 2023-10-07 DIAGNOSIS — F1410 Cocaine abuse, uncomplicated: Secondary | ICD-10-CM | POA: Diagnosis not present

## 2023-10-07 DIAGNOSIS — S0990XA Unspecified injury of head, initial encounter: Secondary | ICD-10-CM | POA: Diagnosis not present

## 2023-10-07 DIAGNOSIS — F17210 Nicotine dependence, cigarettes, uncomplicated: Secondary | ICD-10-CM | POA: Diagnosis not present

## 2023-10-07 DIAGNOSIS — F1510 Other stimulant abuse, uncomplicated: Secondary | ICD-10-CM | POA: Diagnosis not present

## 2023-10-07 DIAGNOSIS — I10 Essential (primary) hypertension: Secondary | ICD-10-CM | POA: Diagnosis not present

## 2023-10-07 DIAGNOSIS — X58XXXA Exposure to other specified factors, initial encounter: Secondary | ICD-10-CM | POA: Insufficient documentation

## 2023-10-07 DIAGNOSIS — R45851 Suicidal ideations: Secondary | ICD-10-CM

## 2023-10-07 DIAGNOSIS — R456 Violent behavior: Secondary | ICD-10-CM | POA: Diagnosis not present

## 2023-10-07 DIAGNOSIS — S069XAA Unspecified intracranial injury with loss of consciousness status unknown, initial encounter: Secondary | ICD-10-CM | POA: Diagnosis present

## 2023-10-07 DIAGNOSIS — F4310 Post-traumatic stress disorder, unspecified: Secondary | ICD-10-CM | POA: Diagnosis present

## 2023-10-07 DIAGNOSIS — F410 Panic disorder [episodic paroxysmal anxiety] without agoraphobia: Secondary | ICD-10-CM | POA: Insufficient documentation

## 2023-10-07 DIAGNOSIS — R443 Hallucinations, unspecified: Secondary | ICD-10-CM | POA: Insufficient documentation

## 2023-10-07 DIAGNOSIS — N1831 Chronic kidney disease, stage 3a: Secondary | ICD-10-CM

## 2023-10-07 DIAGNOSIS — F1420 Cocaine dependence, uncomplicated: Secondary | ICD-10-CM | POA: Insufficient documentation

## 2023-10-07 DIAGNOSIS — R4689 Other symptoms and signs involving appearance and behavior: Secondary | ICD-10-CM | POA: Diagnosis present

## 2023-10-07 LAB — URINE DRUG SCREEN, QUALITATIVE (ARMC ONLY)
Amphetamines, Ur Screen: POSITIVE — AB
Barbiturates, Ur Screen: NOT DETECTED
Benzodiazepine, Ur Scrn: POSITIVE — AB
Cannabinoid 50 Ng, Ur ~~LOC~~: POSITIVE — AB
Cocaine Metabolite,Ur ~~LOC~~: POSITIVE — AB
MDMA (Ecstasy)Ur Screen: NOT DETECTED
Methadone Scn, Ur: NOT DETECTED
Opiate, Ur Screen: NOT DETECTED
Phencyclidine (PCP) Ur S: NOT DETECTED
Tricyclic, Ur Screen: NOT DETECTED

## 2023-10-07 LAB — CBC
HCT: 45.7 % (ref 39.0–52.0)
Hemoglobin: 15 g/dL (ref 13.0–17.0)
MCH: 29.2 pg (ref 26.0–34.0)
MCHC: 32.8 g/dL (ref 30.0–36.0)
MCV: 89.1 fL (ref 80.0–100.0)
Platelets: 276 10*3/uL (ref 150–400)
RBC: 5.13 MIL/uL (ref 4.22–5.81)
RDW: 14.3 % (ref 11.5–15.5)
WBC: 11.8 10*3/uL — ABNORMAL HIGH (ref 4.0–10.5)
nRBC: 0 % (ref 0.0–0.2)

## 2023-10-07 LAB — COMPREHENSIVE METABOLIC PANEL
ALT: 32 U/L (ref 0–44)
AST: 48 U/L — ABNORMAL HIGH (ref 15–41)
Albumin: 5.1 g/dL — ABNORMAL HIGH (ref 3.5–5.0)
Alkaline Phosphatase: 73 U/L (ref 38–126)
Anion gap: 13 (ref 5–15)
BUN: 19 mg/dL (ref 6–20)
CO2: 20 mmol/L — ABNORMAL LOW (ref 22–32)
Calcium: 9.4 mg/dL (ref 8.9–10.3)
Chloride: 100 mmol/L (ref 98–111)
Creatinine, Ser: 1.57 mg/dL — ABNORMAL HIGH (ref 0.61–1.24)
GFR, Estimated: 57 mL/min — ABNORMAL LOW (ref >60)
Glucose, Bld: 110 mg/dL — ABNORMAL HIGH (ref 70–99)
Potassium: 3.5 mmol/L (ref 3.5–5.1)
Sodium: 133 mmol/L — ABNORMAL LOW (ref 135–145)
Total Bilirubin: 1.2 mg/dL (ref 0.3–1.2)
Total Protein: 9.2 g/dL — ABNORMAL HIGH (ref 6.5–8.1)

## 2023-10-07 LAB — ACETAMINOPHEN LEVEL: Acetaminophen (Tylenol), Serum: <10 ug/mL — ABNORMAL LOW (ref 10–30)

## 2023-10-07 LAB — SALICYLATE LEVEL: Salicylate Lvl: <7 mg/dL — ABNORMAL LOW (ref 7.0–30.0)

## 2023-10-07 LAB — ETHANOL: Alcohol, Ethyl (B): <10 mg/dL (ref <10)

## 2023-10-07 MED ORDER — OLANZAPINE 5 MG PO TBDP
10.0000 mg | ORAL_TABLET | Freq: Every day | ORAL | Status: DC
  Administered 2023-10-07 – 2023-10-08 (×2): 10 mg via ORAL
  Filled 2023-10-07 (×2): qty 2

## 2023-10-07 NOTE — Consult Note (Incomplete)
Telepsych Consultation   Reason for Consult:  Psych Evaluation Referring Physician:  Dr. Marisa Severin Location of Patient: [facility] ER Location of Provider: Other: remote  Patient Identification: RANKIN COOLMAN MRN:  [id number] Principal Diagnosis: <principal problem not specified> Diagnosis:  Active Problems:   Posttraumatic stress disorder   Traumatic brain injury (HCC)   Cocaine dependence (HCC)   Amphetamine abuse (HCC)   Aggressive behavior   Total Time spent with patient: 30 minutes  Subjective:  " I had a spike in my blood pressure and I have panic attacks"   HPI: Jeremy Frank, 37 y.o., male with a psych hx of MDD, psychosis, and PTSD. Additionally, pt has a hx of TBI and polysubstance abuse. Pt presented to [facility] ED voluntarily. patient seen via tele health by TTS and this provider; chart reviewed and consulted with Dr. Marisa Severin on 10/08/23.  On assessment, patient states, I had a panic attack and it took me back to my PTSD.  He also admits to using cocaine and meth and states that the last time he used was 1 hour before getting admitted.  He has been here for approximately 4 hours at the time of assessment and states that he's feeling better.  He says he has "very bad anxiety"  [facility]  He says he is prescribed olanzapine which helps.  Per TTS, pt. presented with restless psychomotor activity and a silly disposition. Pt had clear and coherent speech. Pt expressed having feelings of paranoia about people following him prior to his arrival, which contributed to his unbearable anxiety. Pt was preoccupied with several somatic complaints such as feeling that his blood pressure was elevated, which increased his anxiety levels. Pt admitted to using unknown amounts of cocaine prior to his arrival due to his inability to find meth at the time. Pt seemed to have thought blocking and appeared to be responding to internal stimuli. Pt 's thoughts were  intact and linear. Pt had fair insight; explaining that he is taking steps towards recovery as he is supposed to begin substance abuse classes through the [place].   Pt denied SI/HI/AV/H. Pt had fleeting eye contact. UDS + for cannabis, cocaine, meth, and benzos.   Past Psychiatric History:  Active Problems:   Posttraumatic stress disorder   Traumatic brain injury (HCC)   Cocaine dependence (HCC)   Amphetamine abuse (HCC)   Aggressive behavior  Risk to Self:   Risk to Others:   Prior Inpatient Therapy:   Prior Outpatient Therapy:    Past Medical History:  Past Medical History:  Diagnosis Date   Drug dependence (HCC)    GI bleed    Hiatal hernia    Hypertension    Migraine    PTSD (post-traumatic stress disorder)     Past Surgical History:  Procedure Laterality Date   KNEE SURGERY     Family History:  Family History  Problem Relation Age of Onset   Heart failure Mother    Hypertension Father    Family Psychiatric  History: unknown Social History:  Social History   Substance and Sexual Activity  Alcohol Use Yes   Alcohol/week: 42.0 standard drinks of alcohol   Types: 42 Cans of beer per week   Comment: per pt he drinks 6-12 cans of beer daily     Social History   Substance and Sexual Activity  Drug Use Yes   Types: Cocaine, Marijuana, Amphetamines    Social History   Socioeconomic  History   Marital status: Single    Spouse name: Not on file   Number of children: Not on file   Years of education: Not on file   Highest education level: Not on file  Occupational History   Not on file  Tobacco Use   Smoking status: Every Day    Current packs/day: 0.25    Types: Cigarettes   Smokeless tobacco: Never  Vaping Use   Vaping status: Never Used  Substance and Sexual Activity   Alcohol use: Yes    Alcohol/week: 42.0 standard drinks of alcohol    Types: 42 Cans of beer per week    Comment: per pt he drinks 6-12 cans of beer daily   Drug use: Yes    Types:  Cocaine, Marijuana, Amphetamines   Sexual activity: Not Currently  Other Topics Concern   Not on file  Social History Narrative   Not on file   Social Determinants of Health   Financial Resource Strain: Not on file  Food Insecurity: No Food Insecurity (07/01/2023)   Received from [facility]   Hunger Vital Sign    Worried About Running Out of Food in the Last Year: Never true    Ran Out of Food in the Last Year: Never true  Transportation Needs: No Transportation Needs (07/01/2023)   Received from [facility]   PRAPARE - Transportation    Lack of Transportation (Medical): No    Lack of Transportation (Non-Medical): No  Physical Activity: Not on file  Stress: Not on file  Social Connections: Not on file   Additional Social History:    Allergies:   Allergies  Allergen Reactions   Peanut-Containing Drug Products Diarrhea, Anaphylaxis and Swelling   Cheese Diarrhea   Egg-Derived Products Diarrhea    Labs:  Results for orders placed or performed during the hospital encounter of 10/07/23 (from the past 48 hour(s))  Urine Drug Screen, Qualitative     Status: Abnormal   Collection Time: 10/07/23  4:32 PM  Result Value Ref Range   Tricyclic, Ur Screen NONE DETECTED NONE DETECTED   Amphetamines, Ur Screen POSITIVE (A) NONE DETECTED   MDMA (Ecstasy)Ur Screen NONE DETECTED NONE DETECTED   Cocaine Metabolite,Ur [place] POSITIVE (A) NONE DETECTED   Opiate, Ur Screen NONE DETECTED NONE DETECTED   Phencyclidine (PCP) Ur S NONE DETECTED NONE DETECTED   Cannabinoid 50 Ng, Ur [place] POSITIVE (A) NONE DETECTED   Barbiturates, Ur Screen NONE DETECTED NONE DETECTED   Benzodiazepine, Ur Scrn POSITIVE (A) NONE DETECTED   Methadone Scn, Ur NONE DETECTED NONE DETECTED    Comment: (NOTE) Tricyclics + metabolites, urine    Cutoff 1000 ng/mL Amphetamines + metabolites, urine  Cutoff 1000 ng/mL MDMA (Ecstasy), urine              Cutoff 500 ng/mL Cocaine Metabolite, urine          Cutoff  300 ng/mL Opiate + metabolites, urine        Cutoff 300 ng/mL Phencyclidine (PCP), urine         Cutoff 25 ng/mL Cannabinoid, urine                 Cutoff 50 ng/mL Barbiturates + metabolites, urine  Cutoff 200 ng/mL Benzodiazepine, urine              Cutoff 200 ng/mL Methadone, urine                   Cutoff 300 ng/mL  The urine drug screen provides only a preliminary, unconfirmed analytical test result and should not be used for non-medical purposes. Clinical consideration and professional judgment should be applied to any positive drug screen result due to possible interfering substances. A more specific alternate chemical method must be used in order to obtain a confirmed analytical result. Gas chromatography / mass spectrometry (GC/MS) is the preferred confirm atory method. Performed at [facility], [address] [place], [place] [address]   Comprehensive metabolic panel     Status: Abnormal   Collection Time: 10/07/23  4:34 PM  Result Value Ref Range   Sodium 133 (L) 135 - 145 mmol/L   Potassium 3.5 3.5 - 5.1 mmol/L   Chloride 100 98 - 111 mmol/L   CO2 20 (L) 22 - 32 mmol/L   Glucose, Bld 110 (H) 70 - 99 mg/dL    Comment: Glucose reference range applies only to samples taken after fasting for at least 8 hours.   BUN 19 6 - 20 mg/dL   Creatinine, Ser 6.04 (H) 0.61 - 1.24 mg/dL   Calcium 9.4 8.9 - 54.0 mg/dL   Total Protein 9.2 (H) 6.5 - 8.1 g/dL   Albumin 5.1 (H) 3.5 - 5.0 g/dL   AST 48 (H) 15 - 41 U/L   ALT 32 0 - 44 U/L   Alkaline Phosphatase 73 38 - 126 U/L   Total Bilirubin 1.2 0.3 - 1.2 mg/dL   GFR, Estimated 57 (L) >60 mL/min    Comment: (NOTE) Calculated using the CKD-EPI Creatinine Equation (2021)    Anion gap 13 5 - 15    Comment: Performed at [facility], [address] [place], [place] [address]  Ethanol     Status: None   Collection Time: 10/07/23  4:34 PM  Result Value Ref Range   Alcohol, Ethyl (B) <10 <10 mg/dL    Comment:  (NOTE) Lowest detectable limit for serum alcohol is 10 mg/dL.  For medical purposes only. Performed at [facility], [address] [place], [place] [address]   Salicylate level     Status: Abnormal   Collection Time: 10/07/23  4:34 PM  Result Value Ref Range   Salicylate Lvl <7.0 (L) 7.0 - 30.0 mg/dL    Comment: Performed at [facility], [address] [place], [place] [address]  Acetaminophen level     Status: Abnormal   Collection Time: 10/07/23  4:34 PM  Result Value Ref Range   Acetaminophen (Tylenol), Serum <10 (L) 10 - 30 ug/mL    Comment: (NOTE) Therapeutic concentrations vary significantly. A range of 10-30 ug/mL  may be an effective concentration for many patients. However, some  are best treated at concentrations outside of this range. Acetaminophen concentrations >150 ug/mL at 4 hours after ingestion  and >50 ug/mL at 12 hours after ingestion are often associated with  toxic reactions.  Performed at [facility], [address] [place], [place] [address]   cbc     Status: Abnormal   Collection Time: 10/07/23  4:34 PM  Result Value Ref Range   WBC 11.8 (H) 4.0 - 10.5 K/uL   RBC 5.13 4.22 - 5.81 MIL/uL   Hemoglobin 15.0 13.0 - 17.0 g/dL   HCT 78.4 69.6 - 29.5 %   MCV 89.1 80.0 - 100.0 fL   MCH 29.2 26.0 - 34.0 pg   MCHC 32.8 30.0 - 36.0 g/dL   RDW 28.4 13.2 - 44.0 %   Platelets 276 150 -  400 K/uL   nRBC 0.0 0.0 - 0.2 %    Comment: Performed at [facility], [address] [place], [place] [address]    Medications:  No current facility-administered medications for this encounter.   Current Outpatient Medications  Medication Sig Dispense Refill   acetaminophen (TYLENOL) 500 MG tablet Take 500 mg by mouth every 6 (six) hours as needed for mild pain, moderate pain, fever or headache.     divalproex (DEPAKOTE) 500 MG DR tablet Take 1 tablet (500 mg total) by mouth every 12 (twelve) hours. (Patient not taking: Reported  on 03/18/2023) 60 tablet 1   gabapentin (NEURONTIN) 300 MG capsule Take 1 capsule (300 mg total) by mouth 3 (three) times daily. (Patient not taking: Reported on 03/18/2023) 90 capsule 1   hydrochlorothiazide (HYDRODIURIL) 12.5 MG tablet Take 1 tablet (12.5 mg total) by mouth daily. 30 tablet 0   hydrochlorothiazide (HYDRODIURIL) 12.5 MG tablet Take 1 tablet (12.5 mg total) by mouth daily for 2 days. 2 tablet 0   hydrOXYzine (ATARAX) 50 MG tablet Take 1 tablet (50 mg total) by mouth every 4 (four) hours as needed for anxiety. (Patient not taking: Reported on 09/27/2022) 30 tablet 1   ibuprofen (ADVIL) 200 MG tablet Take 200 mg by mouth every 6 (six) hours as needed for fever, headache, mild pain or moderate pain.     OLANZapine (ZYPREXA) 5 MG tablet Take 1 tablet (5 mg total) by mouth at bedtime. (Patient not taking: Reported on 09/27/2022) 30 tablet WARM   pantoprazole (PROTONIX) 40 MG tablet Take 2 tablets (80 mg total) by mouth daily. (Patient not taking: Reported on 09/27/2022) 60 tablet 1   traZODone (DESYREL) 100 MG tablet Take 1 tablet (100 mg total) by mouth at bedtime as needed for sleep. (Patient not taking: Reported on 04/01/2022) 30 tablet 1    Musculoskeletal: Strength & Muscle Tone: within normal limits Gait & Station: normal Patient leans: N/A          Psychiatric Specialty Exam:  Presentation  General Appearance:  Appropriate for Environment  Eye Contact: Fair  Speech: Clear and Coherent  Speech Volume: Decreased  Handedness:No data recorded  Mood and Affect  Mood: Depressed; Dysphoric  Affect: Appropriate; Depressed; Flat   Thought Process  Thought Processes: Coherent  Descriptions of Associations:Intact  Orientation:Full (Time, Place and Person)  Thought Content:WDL; Logical  History of Schizophrenia/Schizoaffective disorder:No  Duration of Psychotic Symptoms:Greater than six months  Hallucinations:None Ideas of Reference:None  Suicidal  Thoughts:None Homicidal Thoughts:None  Sensorium  Memory: Immediate Fair; Remote Fair  Judgment: Intact  Insight: Good   Executive Functions  Concentration: Fair  Attention Span: Fair  Recall: Fair  Fund of Knowledge: Fair  Language: Good   Psychomotor Activity  Psychomotor Activity:WDL  Assets  Assets: Desire for Improvement; Communication Skills; Financial Resources/Insurance   Sleep  Sleep: Fair    Physical Exam: Physical Exam Vitals and nursing note reviewed.  Constitutional:      Appearance: Normal appearance.  HENT:     Head: Normocephalic and atraumatic.     Nose: Nose normal.  Eyes:     Pupils: Pupils are equal, round, and reactive to light.  Pulmonary:     Effort: Pulmonary effort is normal.  Musculoskeletal:        General: Normal range of motion.     Cervical back: Normal range of motion.  Skin:    General: Skin is warm.  Neurological:     Mental Status: He is alert  and oriented to person, place, and time.  Psychiatric:        Attention and Perception: Attention and perception normal.        Mood and Affect: Mood and affect normal.        Speech: Speech normal.        Behavior: Behavior normal. Behavior is cooperative.        Thought Content: Thought content normal. Thought content does not include suicidal ideation. Thought content does not include suicidal plan.        Judgment: Judgment is impulsive.    Review of Systems  Psychiatric/Behavioral:  Positive for hallucinations and substance abuse.   All other systems reviewed and are negative.  Blood pressure (!) 156/139, pulse (!) 107, temperature 98 F (36.7 C), resp. rate 20, SpO2 95%. There is no height or weight on file to calculate BMI.   Disposition: No evidence of imminent risk to self or others at present.   Supportive therapy provided about ongoing stressors. Discussed crisis plan, support from social network, calling 911, coming to the Emergency Department, and  calling Suicide Hotline.  This service was provided via telemedicine using a 2-way, interactive audio and video technology.    Jearld Lesch, NP 10/07/2023 8:46 PM

## 2023-10-07 NOTE — ED Triage Notes (Signed)
Pt is here voluntarily because he has been feeling "overwhelmed" and having "panic attacks" and "having unsafe thoughts, SI and HI". He reports hx of TBI and feeling confused and overwhelmed.  Pt states that he has been drinking.  Pt is cooperative and tearful in triage and expresses desire for help

## 2023-10-07 NOTE — ED Triage Notes (Signed)
Pt walked to [facility] and [facility] called EMS- pt was requesting "mental help". Per EMS pt was stating he was hearing voices and mood was very labile. Denies SI/HI.   132HR 174/139 98RA 20RR
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
## 2023-10-07 NOTE — ED Notes (Signed)
pt recieved snack and drink 
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
## 2023-10-07 NOTE — ED Notes (Signed)
ivc by MD Siadecki/ psych consult ordered/pending.Marland KitchenMarland Kitchen

## 2023-10-07 NOTE — BH Assessment (Signed)
Comprehensive Clinical Assessment (CCA) Screening, Triage and Referral Note  10/07/2023 Jeremy Frank [id number] Recommendations for Services/Supports/Treatments: Consulted with Rashaun D., NP, who recommended pt. be observed overnight and reassessed in the AM. Jeremy Frank is a 37 year old, English speaking, black male with a psych hx of MDD, psychosis, and PTSD. Additionally, pt has a hx of TBI and polysubstance abuse. Pt presented to [facility] ED voluntarily.  On assessment, pt. presented with restless psychomotor activity and a silly disposition. Pt had clear and coherent speech. Pt expressed having feelings of paranoia about people following him prior to his arrival, which contributed to his unbearable anxiety. Pt was preoccupied with several somatic complaints such as feeling that his blood pressure was elevated, which increased his anxiety levels. Pt admitted to using unknown amounts of cocaine prior to his arrival due to his inability to find meth at the time. Pt seemed to have thought blocking and appeared to be responding to internal stimuli. Pt 's thoughts were intact and linear. Pt had fair insight; explaining that he is taking steps towards recovery as he is supposed to begin substance abuse classes through the [place]. Pt denied SI/HI/AV/H. Pt had fleeting eye contact. UDS + for cannabis, cocaine, meth, and benzos. Chief Complaint:  Chief Complaint  Patient presents with   Suicidal   Visit Diagnosis: Posttraumatic stress disorder   Traumatic brain injury (HCC)   Cocaine dependence (HCC)   Amphetamine abuse (HCC)   Aggressive behavior    Patient Reported Information How did you hear about [place]? Self  What Is the Reason for Your Visit/Call Today? Presents via EMS from home  States he was involved in a verbal altercation this am  then developed some chest discomfort  How Long Has This Been Causing You Problems? <Week  What Do You Feel Would Help You the Most Today? Alcohol or Drug Use  Treatment   Have You Recently Had Any Thoughts About Hurting Yourself? No  Are You Planning to Commit Suicide/Harm Yourself At This time? No   Have you Recently Had Thoughts About Hurting Someone [name]? No  Are You Planning to Harm Someone at This Time? No  Explanation: No data recorded  Have You Used Any Alcohol or Drugs in the Past 24 Hours? Yes  How Long Ago Did You Use Drugs or Alcohol? No data recorded What Did You Use and How Much? Alcoholl, mjarijuana, and methamphentamine.   Do You Currently Have a Therapist/Psychiatrist? Yes  Name of Therapist[facility]   Have You Been Recently Discharged From Any Public relations account executive or Programs? Yes  Explanation of Discharge From Practice/Program: No data recorded   CCA Screening Triage Referral Assessment Type of Contact: Face-to-Face  Telemedicine Service Delivery:   Is this Initial or Reassessment?   Date Telepsych consult ordered in [facility]:    Time Telepsych consult ordered in [facility]:    Location of Assessment: [facility] ED  Provider Location: [facility] ED    Collateral Involvement: None provided   Does Patient Have a Court Appointed Legal Guardian? No data recorded Name and Contact of Legal Guardian: No data recorded If Minor and Not Living with Parent(s), Who has Custody? No data recorded Is CPS involved or ever been involved? Never  Is APS involved or ever been involved? Never   Patient Determined To Be At Risk for Harm To Self or Others Based on Review of Patient Reported Information or Presenting Complaint? No  Method: No data recorded Availability of Means: No data recorded Intent: No data  recorded Notification Required: No data recorded Additional Information for Danger to Others Potential: No data recorded Additional Comments for Danger to Others Potential: No data recorded Are There Guns or Other Weapons in Your Home? No  Types of Guns/Weapons: No data recorded Are These Weapons Safely  Secured?                            No data recorded Who Could Verify You Are Able To Have These Secured: No data recorded Do You Have any Outstanding Charges, Pending Court Dates, Parole/Probation? No data recorded Contacted To Inform of Risk of Harm To Self or Others: No data recorded  Does Patient Present under Involuntary Commitment? No    [place] of Residence: [place]   Patient Currently Receiving the Following Services: No data recorded  Determination of Need: Emergent (2 hours)   Options For Referral: Chemical Dependency Intensive Outpatient Therapy (CDIOP)   Discharge Disposition:     Vineet Kinney R Dimitri Dsouza, LCAS

## 2023-10-08 DIAGNOSIS — F1410 Cocaine abuse, uncomplicated: Secondary | ICD-10-CM

## 2023-10-08 DIAGNOSIS — F410 Panic disorder [episodic paroxysmal anxiety] without agoraphobia: Secondary | ICD-10-CM

## 2023-10-08 NOTE — ED Notes (Signed)
IVC/pending disposition  

## 2023-10-08 NOTE — ED Provider Notes (Signed)
Emergency Medicine Observation Re-evaluation Note  Physical Exam   BP (!) 156/139 (BP Location: Left Arm)   Pulse (!) 107   Temp 98 F (36.7 C)   Resp 20   SpO2 95%   Patient appears in no acute distress.  ED Course / MDM   No reported events during my shift at the time of this note.   Pt is awaiting dispo from consultants   Pilar Jarvis MD    Pilar Jarvis, MD 10/08/23 [phone]

## 2023-10-08 NOTE — ED Notes (Signed)
ivc/consult done/recommend pt to be observed overnight & reassess in the am..

## 2023-10-08 NOTE — ED Notes (Signed)
Pt received evening snack with fresh ice water in a foam cup. Individual food items opened prior to pt receiving tray. Pt made aware no other food would be handed out unil breakfast tomorrow morning.  

## 2023-10-08 NOTE — BH Assessment (Signed)
TTS spoke with the patient to complete an updated/reassessment. Patient denies SI/HI and AV/H. He states now that he has slept, he feels better and wants to go home. He acknowledges his illicit substance use and lack of sleep, contributed to the symptoms that brought him to the ER.

## 2023-10-09 DIAGNOSIS — F201 Disorganized schizophrenia: Secondary | ICD-10-CM

## 2023-10-09 NOTE — ED Notes (Signed)
Hospital meal provided.  100% consumed, pt tolerated w/o complaints.  Waste discarded appropriately.   

## 2023-10-09 NOTE — ED Notes (Signed)
Dinner tray provided for pt 

## 2023-10-09 NOTE — ED Notes (Signed)
Belongings bag 3/3 returned to patient- money from safe returned to patient. E-signature not working at this time. Pt verbalized understanding of D/C instructions, prescriptions and follow up care with no further questions at this time. Pt in NAD and ambulatory at time of D/C.

## 2023-10-09 NOTE — Discharge Instructions (Addendum)
Please seek medical attention and help for any thoughts about wanting to harm yourself, harm others, any concerning change in behavior, severe depression, inappropriate drug use or any other new or concerning symptoms. ° °

## 2023-10-09 NOTE — ED Notes (Signed)
IVC PAPERS  RESCINDED  PER  DR  Derrill Kay  Ranae Plumber  RN

## 2023-10-09 NOTE — Discharge Summary (Signed)
[facility] Psych ED Discharge  10/09/2023 2:58 PM WILMORE HOLSOMBACK  MRN:  [id number]  Principal Problem: <principal problem not specified> Discharge Diagnoses: Active Problems:   PTSD (post-traumatic stress disorder)   Traumatic brain injury (HCC)   Cocaine dependence (HCC)   Methamphetamine abuse (HCC)   Aggressive behavior   Panic disorder   Cocaine abuse (HCC)   Disorganized schizophrenia (HCC)  Clinical Impression:  Final diagnoses:  Suicidal ideation   Subjective: "I rested..."  ED Assessment Time Calculation: No data recorded  Past Psychiatric History: Lorenzo Arscott is a 37 year-old veteran male admitted to [facility]ED on 10/07/2023 secondary to increased hallucinations and paranoia. Per chart review, patient also  endorsed suicidal ideations. He presented with a hx of MDD, PTSD, Substance-induced psychosis and Amphetamine use problem. He was evaluated and it was noted that patient has had frequent visits to ED and other services . On 01/03/2023, patient was evaluated by Durwin Nora when he presented with depression and substance abuse.  Lab work completed with no indication of significant medical problem. UDSS indicated use of amphetamines, Cocaine, THC and benzodiazepines.   Upon face to face assessment: 37 year-old male sitting in his bed and pleasant upon approach. He is  casually dressed and groomed. Alert and oriented x 4.  He appears healthy and well nourished.  He has good eye contact and does not appear to be responding to internal stimuli.  His thought process is goal-directed. He reports that he came here because "someone was following me and...made me anxious". He reports that this is not the first time it happens to him but this time he was not able to manage. He reports a hx of taking Olanzapine to manage his hallucinations.  He reports that he had been using meth, THC and was drinking alcohol  "but this I don't think this is the cause because this isn't the first time I use, this was  induced by something". Patient reports  that there were cameras allover, watching him and people were monitoring him on their phones "I am not crazy, I know what I am talking about".   Patient reports a family hx of mental illness: reports that his uncle has schizophrenia "but I am not as crazy as him". He reports that he receives [place] services . He reports that his appetite is good and  he has been sleeping well.    Patient is a veteran who receives [place] services and benefits. He reports that he currently lives with his mother who is supportive. He reports that he has a 27 year-old daughter and has good relationship with her.   Patient does not appear to be in any acute distress. He denies SI/HI. He reports that the person still tries to follow him "but I know what to do, I will play my music and will ignore him".  He also reports that "I usually communicate with my church people".  He is willing to continue taking his Zyprexa and will [facility] [place].   I consulted with Dr Marlou Porch and he recommended discharge with additional resources for outpatient services. Dr Fuller Plan notified at ED notified.      Past Medical History:  Diagnosis Date   Drug dependence (HCC)    GI bleed    Hiatal hernia    Hypertension    Migraine    PTSD (post-traumatic stress disorder)     Past Surgical History:  Procedure Laterality Date   KNEE SURGERY  Family History:  Family History  Problem Relation Age of Onset   Heart failure Mother    Hypertension Father    Family Psychiatric  History: Uncle has hx of Schizophrenia Social History:  Social History   Substance and Sexual Activity  Alcohol Use Yes   Alcohol/week: 42.0 standard drinks of alcohol   Types: 42 Cans of beer per week   Comment: per pt he drinks 6-12 cans of beer daily     Social History   Substance and Sexual Activity  Drug Use Yes   Types: Cocaine, Marijuana, Amphetamines    Social History   Socioeconomic  History   Marital status: Single    Spouse name: Not on file   Number of children: Not on file   Years of education: Not on file   Highest education level: Not on file  Occupational History   Not on file  Tobacco Use   Smoking status: Every Day    Current packs/day: 0.25    Types: Cigarettes   Smokeless tobacco: Never  Vaping Use   Vaping status: Never Used  Substance and Sexual Activity   Alcohol use: Yes    Alcohol/week: 42.0 standard drinks of alcohol    Types: 42 Cans of beer per week    Comment: per pt he drinks 6-12 cans of beer daily   Drug use: Yes    Types: Cocaine, Marijuana, Amphetamines   Sexual activity: Not Currently  Other Topics Concern   Not on file  Social History Narrative   Not on file   Social Determinants of Health   Financial Resource Strain: Not on file  Food Insecurity: No Food Insecurity (07/01/2023)   Received from [facility]   Hunger Vital Sign    Worried About Running Out of Food in the Last Year: Never true    Ran Out of Food in the Last Year: Never true  Transportation Needs: No Transportation Needs (07/01/2023)   Received from [facility]   PRAPARE - Transportation    Lack of Transportation (Medical): No    Lack of Transportation (Non-Medical): No  Physical Activity: Not on file  Stress: Not on file  Social Connections: Not on file    Tobacco Cessation:  N/A, patient does not currently use tobacco products  Current Medications: Current Facility-Administered Medications  Medication Dose Route Frequency Provider Last Rate Last Admin   OLANZapine zydis (ZYPREXA) disintegrating tablet 10 mg  10 mg Oral QHS Dixon, Rashaun M, NP   10 mg at 10/08/23 2117   Current Outpatient Medications  Medication Sig Dispense Refill   naloxone (NARCAN) nasal spray 4 mg/0.1 mL Place 1 spray into the nose once. SPRAY 1 SPRAY INTO NOSE AS NEEDED FOR OPIOID OVERDOSE **FOR EMERGENCY USE ONLY!** IN CASE OF EMERGENCY     nicotine (NICODERM CQ  - DOSED IN MG/24 HOURS) 14 mg/24hr patch Place 14 mg onto the skin daily.     acetaminophen (TYLENOL) 500 MG tablet Take 500 mg by mouth every 6 (six) hours as needed for mild pain, moderate pain, fever or headache. (Patient not taking: Reported on 10/08/2023)     divalproex (DEPAKOTE) 500 MG DR tablet Take 1 tablet (500 mg total) by mouth every 12 (twelve) hours. (Patient not taking: Reported on 03/18/2023) 60 tablet 1   FLUoxetine (PROZAC) 20 MG capsule Take 1 tablet by mouth daily. (Patient not taking: Reported on 10/08/2023)     gabapentin (NEURONTIN) 300 MG capsule Take 1 capsule (300 mg total)  by mouth 3 (three) times daily. (Patient not taking: Reported on 03/18/2023) 90 capsule 1   hydrochlorothiazide (HYDRODIURIL) 12.5 MG tablet Take 1 tablet (12.5 mg total) by mouth daily. 30 tablet 0   hydrochlorothiazide (HYDRODIURIL) 12.5 MG tablet Take 1 tablet (12.5 mg total) by mouth daily for 2 days. 2 tablet 0   hydrOXYzine (ATARAX) 50 MG tablet Take 1 tablet (50 mg total) by mouth every 4 (four) hours as needed for anxiety. (Patient not taking: Reported on 09/27/2022) 30 tablet 1   ibuprofen (ADVIL) 200 MG tablet Take 200 mg by mouth every 6 (six) hours as needed for fever, headache, mild pain or moderate pain. (Patient not taking: Reported on 10/08/2023)     miconazole (MICOTIN) 2 % powder Apply topically. APPLY MODERATE AMOUNT TOPICALLY TWO TIMES A DAY FOR ATHLETE'S FOOT (Patient not taking: Reported on 10/08/2023)     OLANZapine (ZYPREXA) 5 MG tablet Take 1 tablet (5 mg total) by mouth at bedtime. (Patient not taking: Reported on 09/27/2022) 30 tablet WARM   oxyCODONE (OXY IR/ROXICODONE) 5 MG immediate release tablet Take 1 tablet by mouth every 8 (eight) hours as needed. (Patient not taking: Reported on 10/08/2023)     pantoprazole (PROTONIX) 40 MG tablet Take 2 tablets (80 mg total) by mouth daily. (Patient not taking: Reported on 09/27/2022) 60 tablet 1   traZODone (DESYREL) 100 MG tablet Take 1  tablet (100 mg total) by mouth at bedtime as needed for sleep. (Patient not taking: Reported on 04/01/2022) 30 tablet 1   PTA Medications: (Not in a hospital admission)   [place] Scale:  Flowsheet Row ED from 10/07/2023 in [facility] Emergency Department at [facility] ED from 09/20/2023 in [facility] Emergency Department at [facility] ED from 06/13/2023 in [facility] Emergency Department at [facility]  C-SSRS RISK CATEGORY High Risk No Risk No Risk       Musculoskeletal: Strength & Muscle Tone: within normal limits Gait & Station: normal Patient leans: N/A  Psychiatric Specialty Exam: Presentation  General Appearance:  Appropriate for Environment  Eye Contact: Fair  Speech: Clear and Coherent  Speech Volume: Decreased  Handedness:No data recorded  Mood and Affect  Mood: Depressed; Dysphoric  Affect: Appropriate; Depressed; Flat   Thought Process  Thought Processes: Coherent  Descriptions of Associations:Intact  Orientation:Full (Time, Place and Person)  Thought Content:WDL; Logical  History of Schizophrenia/Schizoaffective disorder:No  Duration of Psychotic Symptoms:Greater than six months  Hallucinations:No data recorded Ideas of Reference:None  Suicidal Thoughts:No data recorded Homicidal Thoughts:No data recorded  Sensorium  Memory: Immediate Fair; Remote Fair  Judgment: Intact  Insight: Good   Executive Functions  Concentration: Fair  Attention Span: Fair  Recall: Fair  Fund of Knowledge: Fair  Language: Good   Psychomotor Activity  Psychomotor Activity:No data recorded  Assets  Assets: Desire for Improvement; Communication Skills; Financial Resources/Insurance   Sleep  Sleep:No data recorded   Physical Exam: Physical Exam Vitals and nursing note reviewed.  Constitutional:      Appearance: Normal appearance. He is normal weight.  HENT:     Head: Normocephalic and atraumatic.      Right Ear: Tympanic membrane normal.     Left Ear: Tympanic membrane normal.     Nose: Nose normal.     Mouth/Throat:     Mouth: Mucous membranes are moist.  Eyes:     Extraocular Movements: Extraocular movements intact.     Pupils: Pupils are equal, round, and reactive to light.  Cardiovascular:  Rate and Rhythm: Normal rate.     Pulses: Normal pulses.  Pulmonary:     Effort: Pulmonary effort is normal.  Musculoskeletal:        General: Normal range of motion.     Cervical back: Normal range of motion and neck supple.  Neurological:     General: No focal deficit present.     Mental Status: He is alert and oriented to person, place, and time.  Psychiatric:        Thought Content: Thought content normal.    Review of Systems  Constitutional: Negative.   HENT: Negative.    Eyes: Negative.   Respiratory: Negative.    Cardiovascular: Negative.   Gastrointestinal: Negative.   Genitourinary: Negative.   Musculoskeletal: Negative.   Skin: Negative.   Neurological: Negative.   Endo/Heme/Allergies: Negative.   Psychiatric/Behavioral:  Positive for hallucinations. The patient is nervous/anxious.    Blood pressure 134/72, pulse 70, temperature 97.8 F (36.6 C), temperature source Oral, resp. rate 16, SpO2 98%. There is no height or weight on file to calculate BMI.   Demographic Factors:  Male and Unemployed  Loss Factors: NA  Historical Factors: NA  Risk Reduction Factors:   Responsible for children under 37 years of age, Religious beliefs about death, and Living with another person, especially a relative  Continued Clinical Symptoms:  Depression:   Delusional  Cognitive Features That Contribute To Risk:  None    Suicide Risk:  Minimal: No identifiable suicidal ideation.  Patients presenting with no risk factors but with morbid ruminations; may be classified as minimal risk based on the severity of the depressive symptoms    Plan Of Care/Follow-up  recommendations:  Activity:  As tolerated  Medical Decision Making: P  Problem 1: PTSD  Problem 2: Substance induced Psychosis     Disposition: Patient  does not meet criteria for inpatient.   Discharge to current setting. Recommend follw upo at [facility] services.  Olin Pia, NP 10/09/2023, 2:58 PM

## 2023-10-09 NOTE — ED Notes (Signed)
IVC/ recommended pt. be observed overnight and reassessed in the AM.

## 2023-10-14 ENCOUNTER — Other Ambulatory Visit: Payer: Self-pay

## 2023-10-14 ENCOUNTER — Emergency Department
Admission: EM | Admit: 2023-10-14 | Discharge: 2023-10-17 | Disposition: A | Discharge status: Other Acute Inpatient Hospital | Payer: No Typology Code available for payment source | Attending: Emergency Medicine | Admitting: Emergency Medicine

## 2023-10-14 DIAGNOSIS — Z79899 Other long term (current) drug therapy: Secondary | ICD-10-CM | POA: Insufficient documentation

## 2023-10-14 DIAGNOSIS — F15959 Other stimulant use, unspecified with stimulant-induced psychotic disorder, unspecified: Secondary | ICD-10-CM | POA: Insufficient documentation

## 2023-10-14 DIAGNOSIS — F309 Manic episode, unspecified: Secondary | ICD-10-CM | POA: Diagnosis not present

## 2023-10-14 DIAGNOSIS — R451 Restlessness and agitation: Secondary | ICD-10-CM | POA: Diagnosis present

## 2023-10-14 DIAGNOSIS — F29 Unspecified psychosis not due to a substance or known physiological condition: Secondary | ICD-10-CM | POA: Insufficient documentation

## 2023-10-14 LAB — COMPREHENSIVE METABOLIC PANEL
ALT: 20 U/L (ref 0–44)
AST: 31 U/L (ref 15–41)
Albumin: 5 g/dL (ref 3.5–5.0)
Alkaline Phosphatase: 76 U/L (ref 38–126)
Anion gap: 11 (ref 5–15)
BUN: 30 mg/dL — ABNORMAL HIGH (ref 6–20)
CO2: 22 mmol/L (ref 22–32)
Calcium: 9.6 mg/dL (ref 8.9–10.3)
Chloride: 102 mmol/L (ref 98–111)
Creatinine, Ser: 1.91 mg/dL — ABNORMAL HIGH (ref 0.61–1.24)
GFR, Estimated: 45 mL/min — ABNORMAL LOW (ref >60)
Glucose, Bld: 92 mg/dL (ref 70–99)
Potassium: 3.8 mmol/L (ref 3.5–5.1)
Sodium: 135 mmol/L (ref 135–145)
Total Bilirubin: 0.4 mg/dL (ref 0.3–1.2)
Total Protein: 9.2 g/dL — ABNORMAL HIGH (ref 6.5–8.1)

## 2023-10-14 LAB — URINE DRUG SCREEN, QUALITATIVE (ARMC ONLY)
Amphetamines, Ur Screen: POSITIVE — AB
Barbiturates, Ur Screen: NOT DETECTED
Benzodiazepine, Ur Scrn: POSITIVE — AB
Cannabinoid 50 Ng, Ur ~~LOC~~: POSITIVE — AB
Cocaine Metabolite,Ur ~~LOC~~: NOT DETECTED
MDMA (Ecstasy)Ur Screen: NOT DETECTED
Methadone Scn, Ur: NOT DETECTED
Opiate, Ur Screen: NOT DETECTED
Phencyclidine (PCP) Ur S: NOT DETECTED
Tricyclic, Ur Screen: NOT DETECTED

## 2023-10-14 LAB — CBC
HCT: 44.5 % (ref 39.0–52.0)
Hemoglobin: 14.9 g/dL (ref 13.0–17.0)
MCH: 29.4 pg (ref 26.0–34.0)
MCHC: 33.5 g/dL (ref 30.0–36.0)
MCV: 87.8 fL (ref 80.0–100.0)
Platelets: 265 10*3/uL (ref 150–400)
RBC: 5.07 MIL/uL (ref 4.22–5.81)
RDW: 12.9 % (ref 11.5–15.5)
WBC: 10.1 10*3/uL (ref 4.0–10.5)
nRBC: 0 % (ref 0.0–0.2)

## 2023-10-14 LAB — SALICYLATE LEVEL: Salicylate Lvl: <7 mg/dL — ABNORMAL LOW (ref 7.0–30.0)

## 2023-10-14 LAB — ETHANOL: Alcohol, Ethyl (B): <10 mg/dL (ref <10)

## 2023-10-14 LAB — ACETAMINOPHEN LEVEL: Acetaminophen (Tylenol), Serum: <10 ug/mL — ABNORMAL LOW (ref 10–30)

## 2023-10-14 MED ORDER — FOLIC ACID 1 MG PO TABS
1.0000 mg | ORAL_TABLET | Freq: Every day | ORAL | Status: DC
  Administered 2023-10-16: 1 mg via ORAL
  Filled 2023-10-14 (×2): qty 1

## 2023-10-14 MED ORDER — OLANZAPINE 10 MG IM SOLR
10.0000 mg | Freq: Three times a day (TID) | INTRAMUSCULAR | Status: DC | PRN
  Filled 2023-10-14: qty 10

## 2023-10-14 MED ORDER — DIPHENHYDRAMINE HCL 50 MG/ML IJ SOLN
50.0000 mg | Freq: Three times a day (TID) | INTRAMUSCULAR | Status: DC | PRN
  Administered 2023-10-14: 50 mg via INTRAMUSCULAR
  Filled 2023-10-14: qty 1

## 2023-10-14 MED ORDER — OLANZAPINE 5 MG PO TABS
5.0000 mg | ORAL_TABLET | Freq: Two times a day (BID) | ORAL | Status: DC
  Administered 2023-10-15 – 2023-10-16 (×3): 5 mg via ORAL
  Filled 2023-10-14 (×4): qty 1

## 2023-10-14 MED ORDER — HALOPERIDOL 5 MG PO TABS
5.0000 mg | ORAL_TABLET | Freq: Three times a day (TID) | ORAL | Status: DC | PRN
  Filled 2023-10-14 (×2): qty 1

## 2023-10-14 MED ORDER — LORAZEPAM 1 MG PO TABS: 1.0000 mg | ORAL_TABLET | ORAL | Status: DC | PRN

## 2023-10-14 MED ORDER — DIPHENHYDRAMINE HCL 25 MG PO CAPS
50.0000 mg | ORAL_CAPSULE | Freq: Three times a day (TID) | ORAL | Status: DC | PRN
  Filled 2023-10-14: qty 2

## 2023-10-14 MED ORDER — THIAMINE MONONITRATE 100 MG PO TABS
100.0000 mg | ORAL_TABLET | Freq: Every day | ORAL | Status: DC
  Administered 2023-10-16: 100 mg via ORAL
  Filled 2023-10-14 (×2): qty 1

## 2023-10-14 MED ORDER — ADULT MULTIVITAMIN W/MINERALS CH
1.0000 | ORAL_TABLET | Freq: Every day | ORAL | Status: DC
  Administered 2023-10-16: 1 via ORAL
  Filled 2023-10-14 (×2): qty 1

## 2023-10-14 MED ORDER — OLANZAPINE 10 MG PO TABS: 10.0000 mg | ORAL_TABLET | Freq: Three times a day (TID) | ORAL | Status: DC | PRN

## 2023-10-14 MED ORDER — LORAZEPAM 2 MG PO TABS
2.0000 mg | ORAL_TABLET | Freq: Three times a day (TID) | ORAL | Status: DC | PRN
  Administered 2023-10-16: 2 mg via ORAL
  Filled 2023-10-14: qty 1

## 2023-10-14 MED ORDER — LORAZEPAM 2 MG/ML IJ SOLN
2.0000 mg | Freq: Three times a day (TID) | INTRAMUSCULAR | Status: DC | PRN
  Filled 2023-10-14: qty 1

## 2023-10-14 MED ORDER — HALOPERIDOL LACTATE 5 MG/ML IJ SOLN
5.0000 mg | Freq: Three times a day (TID) | INTRAMUSCULAR | Status: DC | PRN
  Administered 2023-10-14: 5 mg via INTRAMUSCULAR
  Filled 2023-10-14: qty 1

## 2023-10-14 MED ORDER — THIAMINE HCL 100 MG/ML IJ SOLN: 100.0000 mg | Freq: Every day | INTRAMUSCULAR | Status: DC

## 2023-10-14 MED ORDER — LORAZEPAM 1 MG PO TABS: 1.0000 mg | ORAL_TABLET | Freq: Four times a day (QID) | ORAL | Status: DC | PRN
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
## 2023-10-14 NOTE — ED Notes (Signed)
IV[facility]
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
## 2023-10-14 NOTE — BH Assessment (Signed)
Faxed referral to [facility] [phone]

## 2023-10-14 NOTE — ED Notes (Signed)
Pt asleep at this time will give snack later
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
## 2023-10-14 NOTE — BH Assessment (Signed)
Writer contacted [facility] [phone] and spoke with Rolley Sims who states they are at capacity and do not have any beds available.

## 2023-10-14 NOTE — BH Assessment (Signed)
Writer contacted [facility] [phone] and spoke to [place]. She states they have beds available and to fax referral to [phone].

## 2023-10-14 NOTE — ED Notes (Signed)
Patient having rambling speech and stating he doesn't know what is going on, not sitting still to obtain vital signs after multiple attempts. Art therapist and myself attempted to explain IVC and what to expect during this ED visit; patient became agitated and was yelling that he was going to leave. [place] PD assisted patient to chair, Gae Gallop was called to place patient in room.

## 2023-10-14 NOTE — BH Assessment (Signed)
Writer contacted [facility] [phone] ext [phone] and spoke with Aisha. She says [facility] is on diversion for mental health.

## 2023-10-14 NOTE — ED Notes (Signed)
IVC PENDING  CONSULT ?

## 2023-10-14 NOTE — BH Assessment (Signed)
Comprehensive Clinical Assessment (CCA) Note  10/14/2023 Jeremy Frank [id number]  Jeremy Frank, 37 year old male who presents to [facility] ED involuntarily for treatment. Per triage note, Patient to ED with [facility] under IVC; per petition, patient has threatened to kill his mother and has been destructive at home.   During TTS assessment pt presents alert and oriented x 4, restless but cooperative, and mood-congruent with affect. The pt does not appear to be responding to internal or external stimuli. Neither is the pt presenting with any delusional thinking. Pt verified the information provided to triage RN.   Pt identifies his main complaint to be that he was taken against his will and is not sure why he was brought to the ED. "I feel fine. I was hanging out with my friends." Upon arrival, patient presents with excessive talking, disorganized thought, rambling, tangential speech. Patient reports his mom, whom he lives with hates him and "threatened him with homelessness". When asked about substance use, patient is not forthcoming. "You tell me." Patient states he is compliant with his medications and takes them as prescribed. [facility] but assured [facility], mentally. Patient reports he only comes to the ED when he knows he is not doing well and his mom knows he does not like staying in the hospital and reports this was an act of revenge. Pt denies current SI/HI/AH/VH. Patient was asked about previous suicide attempts and patient stated "that's irrelevant." Pt assessment was terminated as he began to escalate, becoming increasingly agitated and aggressive.    Per Annice Pih, NP: Collateral w/ pt's mother, IVC petitioner, [phone]. Reports she is at the [facility] right now obtaining a restraining order on pt. Reports at 3AM pt began growling, hollering, screaming from his bedroom. States when she entered pt was laying across his  bed, bizarre. Reports pt has been diagnosed with paranoid schizophrenia, talks to himself. Pt told her "I'll kill you and I'll tear things up". States she left, went to the police department. She does not know if pt is taking his medications. Reports she believes pt is using substances. Unknown what substances he might be using. She tells me pt is a heavy daily drinker.   Patient UDS positive for amphetamines, benzodiazepines, and cannabinoids.   Per Annice Pih, NP, pt is recommended for inpatient psychiatric admission.    Chief Complaint:  Chief Complaint  Patient presents with   Manic Behavior   Visit Diagnosis: Mania    CCA Screening, Triage and Referral (STR)  Patient Reported Information How did you hear about [place]? -- Mudlogger)  Referral name: No data recorded Referral phone number: No data recorded  Whom do you see for routine medical problems? No data recorded Practice/Facility Name: No data recorded Practice/Facility Phone Number: No data recorded Name of Contact: No data recorded Contact Number: No data recorded Contact Fax Number: No data recorded Prescriber Name: No data recorded Prescriber Address (if known): No data recorded  What Is the Reason for Your Visit/Call Today? Per IVC, patient was brought to the ED for manic behavior and threatening mom.  How Long Has This Been Causing You Problems? <Week  What Do You Feel Would Help You the Most Today? Medication(s); Treatment for Depression or other mood problem; Alcohol or Drug Use Treatment   Have You Recently Been in Any Inpatient Treatment (Hospital/Detox/Crisis Center/28-Day Program)? No data recorded Name/Location of Program/Hospital:No data recorded How Long Were You There? No  data recorded When Were You Discharged? No data recorded  Have You Ever Received Services From [facility] Before? No data recorded Who Do You See at [facility]? No data recorded  Have You Recently Had Any Thoughts About Hurting  Yourself? No  Are You Planning to Commit Suicide/Harm Yourself At This time? No   Have you Recently Had Thoughts About Hurting Someone [name]? No  Explanation: n/a   Have You Used Any Alcohol or Drugs in the Past 24 Hours? Yes  How Long Ago Did You Use Drugs or Alcohol? No data recorded What Did You Use and How Much? Cannibis, amphetamines, benzos   Do You Currently Have a Therapist/Psychiatrist? Yes  Name of Therapist/Psychiatrist[facility]   Have You Been Recently Discharged From Any Public relations account executive or Programs? No  Explanation of Discharge From Practice/Program: n/a     CCA Screening Triage Referral Assessment Type of Contact: Face-to-Face  Is this Initial or Reassessment? No data recorded Date Telepsych consult ordered in [facility]:  No data recorded Time Telepsych consult ordered in [facility]:  No data recorded  Patient Reported Information Reviewed? No data recorded Patient Left Without Being Seen? No data recorded Reason for Not Completing Assessment: No data recorded  Collateral Involvement: None provided   Does Patient Have a Court Appointed Legal Guardian? No data recorded Name and Contact of Legal Guardian: No data recorded If Minor and Not Living with Parent(s), Who has Custody? n/a  Is CPS involved or ever been involved? Never  Is APS involved or ever been involved? Never   Patient Determined To Be At Risk for Harm To Self or Others Based on Review of Patient Reported Information or Presenting Complaint? No  Method: No Plan  Availability of Means: No access or NA  Intent: Vague intent or NA  Notification Required: No need or identified person  Additional Information for Danger to Others Potential: -- (n/a)  Additional Comments for Danger to Others Potential: n/a  Are There Guns or Other Weapons in Your Home? No  Types of Guns/Weapons: n/a  Are These Weapons Safely Secured?                            -- (n/a)  Who Could Verify You  Are Able To Have These Secured: n/a  Do You Have any Outstanding Charges, Pending Court Dates, Parole/Probation? None reported  Contacted To Inform of Risk of Harm To Self or Others: Other: Comment   Location of Assessment: [facility]   Does Patient Present under Involuntary Commitment? Yes  IVC Papers Initial File Date: No data recorded  [place] of Residence: [place]   Patient Currently Receiving the Following Services: Medication Management   Determination of Need: Emergent (2 hours)   Options For Referral: ED Visit; Inpatient Hospitalization; Outpatient Therapy; Medication Management     CCA Biopsychosocial Intake/Chief Complaint:  No data recorded Current Symptoms/Problems: No data recorded  Patient Reported Schizophrenia/Schizoaffective Diagnosis in Past: No data recorded  Strengths: pt is able to ask for help/motivated for change  Preferences: No data recorded Abilities: No data recorded  Type of Services Patient Feels are Needed: No data recorded  Initial Clinical Notes/Concerns: No data recorded  Mental Health Symptoms Depression:   None   Duration of Depressive symptoms: No data recorded  Mania:   Change in energy/activity; Racing thoughts; Increased Energy; Irritability; Overconfidence   Anxiety:    Difficulty concentrating; Irritability; Tension   Psychosis:   Grossly  disorganized or catatonic behavior; Grossly disorganized speech   Duration of Psychotic symptoms:  Greater than six months   Trauma:   Hypervigilance; Difficulty staying/falling asleep; Avoids reminders of event   Obsessions:   N/A   Compulsions:   N/A   Inattention:   Disorganized; Does not seem to listen   Hyperactivity/Impulsivity:   Talks excessively; Difficulty waiting turn   Oppositional/Defiant Behaviors:   N/A   Emotional Irregularity:   Potentially harmful impulsivity   Other Mood/Personality Symptoms:  No data recorded   Mental Status Exam Appearance  and self-care  Stature:   Average   Weight:   Average weight   Clothing:   Casual   Grooming:   Normal   Cosmetic use:   None   Posture/gait:   Normal   Motor activity:   Restless; Agitated   Sensorium  Attention:   Distractible   Concentration:   Anxiety interferes; Scattered   Orientation:   Person; Place; Object; Situation; X5; Time   Recall/memory:   Normal   Affect and Mood  Affect:   Anxious; Congruent   Mood:   Anxious; Depressed   Relating  Eye contact:   Fleeting   Facial expression:   Anxious   Attitude toward examiner:   Cooperative; Irritable; Defensive; Guarded   Thought and Language  Speech flow:  Clear and Coherent   Thought content:   Appropriate to Mood and Circumstances   Preoccupation:   None   Hallucinations:   None   Organization:  No data recorded  [employer] of Knowledge:   Fair   Intelligence:   Average   Abstraction:   Functional   Judgement:   Impaired   Reality Testing:   Adequate   Insight:   Present   Decision Making:   Vacilates; Impulsive   Social Functioning  Social Maturity:   Irresponsible; Impulsive   Social Judgement:   "Chief of Staff"; Heedless   Stress  Stressors:   Housing; Family conflict   Coping Ability:   Exhausted   Skill Deficits:   Interpersonal   Supports:   Support needed     Religion:    Leisure/Recreation:    Exercise/Diet:     CCA Employment/Education Employment/Work Situation: Employment / Work Situation Employment Situation: On disability Why is Patient on Disability: Medical leave of absence from the [employer] for mental health How Long has Patient Been on Disability: Pt stated he has been out of [employer], about 2008  Education:     CCA Family/Childhood History Family and Relationship History: Family history Marital status: Single  Childhood History:     Child/Adolescent Assessment:     CCA Substance  Use Alcohol/Drug Use: Alcohol / Drug Use Pain Medications: See PTA Prescriptions: See PTA Over the Counter: See PTA History of alcohol / drug use?: Yes Longest period of sobriety (when/how long): Unable to quantify Negative Consequences of Use: Personal relationships Substance #1 Name of Substance 1: Marijuana                       ASAM's:  Six Dimensions of Multidimensional Assessment  Dimension 1:  Acute Intoxication and/or Withdrawal Potential:   Dimension 1:  Description of individual's past and current experiences of substance use and withdrawal: Pt has a chronic hx of polysubstance abuse  Dimension 2:  Biomedical Conditions and Complications:      Dimension 3:  Emotional, Behavioral, or Cognitive Conditions and Complications:     Dimension 4:  Readiness to Change:     Dimension 5:  Relapse, Continued use, or Continued Problem Potential:     Dimension 6:  Recovery/Living Environment:     ASAM Severity Score: ASAM's Severity Rating Score: 16  ASAM Recommended Level of Treatment: ASAM Recommended Level of Treatment: Level II Partial Hospitalization Treatment   Substance use Disorder (SUD) Substance Use Disorder (SUD)  Checklist Symptoms of Substance Use: Continued use despite having a persistent/recurrent physical/psychological problem caused/exacerbated by use, Continued use despite persistent or recurrent social, interpersonal problems, caused or exacerbated by use, Presence of craving or strong urge to use, Persistent desire or unsuccessful efforts to cut down or control use, Social, occupational, recreational activities given up or reduced due to use, Recurrent use that results in a failure to fulfill major role obligations (work, school, home), Substance(s) often taken in larger amounts or over longer times than was intended  Recommendations for Services/Supports/Treatments: Recommendations for Services/Supports/Treatments Recommendations For  Services/Supports/Treatments: SAIOP (Substance Abuse Intensive Outpatient Program), Medication Management, Detox, Individual Therapy, IOP (Intensive Outpatient Program)  DSM5 Diagnoses: Patient Active Problem List   Diagnosis Date Noted   Mania (HCC) 10/14/2023   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   Panic disorder 10/08/2023   Cocaine abuse (HCC) 10/08/2023   Aggressive behavior 01/03/2023   Cocaine abuse with cocaine-induced mood disorder (HCC) 10/11/2022   Polysubstance abuse (HCC) 02/10/2022   Depression 02/10/2022   Alcohol abuse 02/10/2022   Tobacco abuse 02/10/2022   HTN (hypertension) 02/10/2022   Lung nodules 02/10/2022   GERD (gastroesophageal reflux disease) 02/10/2022   Chronic kidney disease, stage 3a (HCC) 02/10/2022   Migraine 02/10/2022   Cannabis dependence, continuous (HCC) 06/18/2021   Alcohol abuse with alcohol-induced mood disorder (HCC) 06/18/2021   Crohn's disease (HCC) 06/18/2021   Peptic ulcer 06/18/2021   Severe alcohol dependence (HCC) 06/18/2021   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/2020   Hypertension 10/19/2020   PTSD (post-traumatic stress disorder) 06/13/2015   Traumatic brain injury (HCC) 06/13/2015   Cocaine dependence (HCC) 06/13/2015    Patient Centered Plan: Patient is on the following Treatment Plan(s):  Anxiety and Substance Abuse   Referrals to Alternative Service(s): Referred to Alternative Service(s):   Place:   Date:   Time:    Referred to Alternative Service(s):   Place:   Date:   Time:    Referred to Alternative Service(s):   Place:   Date:   Time:    Referred to Alternative Service(s):   Place:   Date:   Time:      @BHCOLLABOFCARE @  Liann Spaeth R Theatre manager, Counselor, LCAS-A

## 2023-10-15 MED ORDER — HYDROCHLOROTHIAZIDE 12.5 MG PO TABS
12.5000 mg | ORAL_TABLET | Freq: Every day | ORAL | Status: DC
  Administered 2023-10-16: 12.5 mg via ORAL
  Filled 2023-10-15 (×2): qty 1

## 2023-10-15 MED ORDER — NICOTINE 14 MG/24HR TD PT24
14.0000 mg | MEDICATED_PATCH | Freq: Every day | TRANSDERMAL | Status: DC
  Administered 2023-10-17: 14 mg via TRANSDERMAL
  Filled 2023-10-15 (×2): qty 1

## 2023-10-15 NOTE — ED Notes (Signed)
Patient received dinner tray at this time.

## 2023-10-15 NOTE — BH Assessment (Signed)
This Clinical research associate contacted [facility] at [phone] and spoke to staff who reports that they did not receive referral and that they have been having issues with one of the fax numbers, staff offered a second fax number and reports that beds are still available.   Referral was RE-faxed to the following numbers: [phone] and [phone]

## 2023-10-15 NOTE — ED Notes (Signed)
Pt offered scheduled meds, patient states "I dont need or want those". Patient respectful with this RN during interaction.

## 2023-10-15 NOTE — ED Notes (Signed)
Pt refused vitals and medications. Pt wishes to continue resting. Pt respirations even and unlabored at this time. Pt encouraged to inform staff if any needs arise.

## 2023-10-15 NOTE — ED Notes (Signed)
Pt given breakfast tray and beverage.  

## 2023-10-15 NOTE — BH Assessment (Signed)
Writer called and left a HIPPA Compliant message with [phone] ext [phone]), requesting a return phone call.

## 2023-10-15 NOTE — ED Notes (Signed)
IVC/  PENDING  PLACEMENT

## 2023-10-15 NOTE — Discharge Instructions (Signed)
Alcohol resources added to AVS

## 2023-10-15 NOTE — ED Notes (Signed)
Snack was provided.

## 2023-10-15 NOTE — Consult Note (Signed)
  Patient noted lying in the bed, asleep at this time. Patient previously recommended for inpatient psychiatry by an alternate provider. Patient awaiting bed placement.

## 2023-10-15 NOTE — ED Notes (Signed)
IVC/Pending Placement Inpt at [facility] when available

## 2023-10-15 NOTE — TOC Initial Note (Addendum)
Transition of Care [facility]) - Initial/Assessment Note    Patient Details  Name: Jeremy Frank MRN: [id number] Date of Birth: April 22, 1986  [facility] of [facility] CM/SW Contact:    Marquita Palms, LCSW Phone Number: 10/15/2023, 4:09 PM  Clinical Narrative:                  CSW unable to visit with patient at this time, concerns for safety. Substance abuse resources added to AVS.        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   Panic disorder 10/08/2023   Cocaine abuse (HCC) 10/08/2023   Aggressive behavior 01/03/2023   Cocaine abuse with cocaine-induced mood disorder (HCC) 10/11/2022   Polysubstance abuse (HCC) 02/10/2022   Depression 02/10/2022   Alcohol abuse 02/10/2022   Tobacco abuse 02/10/2022   HTN (hypertension) 02/10/2022   Lung nodules 02/10/2022   GERD (gastroesophageal reflux disease) 02/10/2022   Chronic kidney disease, stage 3a (HCC) 02/10/2022   Migraine 02/10/2022   Cannabis dependence, continuous (HCC) 06/18/2021   Alcohol abuse with alcohol-induced mood disorder (HCC) 06/18/2021   Crohn's disease (HCC) 06/18/2021   Peptic ulcer 06/18/2021   Severe alcohol dependence (HCC) 06/18/2021   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/2020   Hypertension 10/19/2020   PTSD (post-traumatic stress disorder) 06/13/2015   Traumatic brain injury (HCC) 06/13/2015   Cocaine dependence (HCC) 06/13/2015   PCP:  [facility]:   [facility] - [place], [place] - [address] [place] [address] Phone: [phone] Fax: [phone]  [facility] [address] [place] [address] Phone: [phone] Fax: [phone]  [facility] [phone] - [place], [place] - [address] [place] [address] Phone: [phone] Fax: [phone]     Social Determinants of Health (SDOH) Social History: SDOH Screenings   Food Insecurity: No Food Insecurity (07/01/2023)   Received from [facility]  Transportation Needs: No Transportation Needs (07/01/2023)   Received from [facility]  Utilities: High Risk (09/01/2022)   [facility]  Alcohol Screen: High Risk (02/10/2022)  Tobacco Use: High Risk (10/14/2023)   SDOH Interventions:     Readmission Risk Interventions     No data to display

## 2023-10-15 NOTE — ED Notes (Signed)
Patient is sleeping at this time.

## 2023-10-15 NOTE — ED Provider Notes (Signed)
-----------------------------------------   5:26 AM on 10/15/2023 -----------------------------------------   Blood pressure 108/78, pulse 77, temperature 97.9 F (36.6 C), temperature source Oral, resp. rate 18, SpO2 98%.  The patient is calm and cooperative at this time.  There have been no acute events since the last update.  Awaiting disposition plan from psych team, placement at the [place].   Delton Prairie, MD 10/15/23 [phone]

## 2023-10-16 DIAGNOSIS — F15959 Other stimulant use, unspecified with stimulant-induced psychotic disorder, unspecified: Secondary | ICD-10-CM | POA: Diagnosis not present

## 2023-10-16 MED ORDER — GABAPENTIN 300 MG PO CAPS
300.0000 mg | ORAL_CAPSULE | Freq: Three times a day (TID) | ORAL | Status: DC
  Administered 2023-10-16 – 2023-10-17 (×3): 300 mg via ORAL
  Filled 2023-10-16 (×3): qty 1

## 2023-10-16 NOTE — ED Notes (Signed)
Pt received dinner. 

## 2023-10-16 NOTE — Consult Note (Cosign Needed Addendum)
[facility] Face-to-Face Psychiatry Consult   Reason for Consult:  Admit Referring Physician:  Dr. 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 male patient admitted under IVC petition. Client seen at bedside, when asked about mood today client reports, "Terrible, I am here". Client began rasing voice and getting irritable stating "I came here sober". It was apparent the questions where upsetting the client, interview ended as he was escalating, inpatient recommended.  [facility] [place] [facility] [place] [facility] is being [facility].  Credited to Darrick Grinder, PMHNP: HPI on admission: Pt chart reviewed and seen at bedside. Pt is disorganized, rambling, tangential, perseverating. Presents with labile affect. States he should not have been involuntarily committed and that his mother is more of a roommate than a mother. Reports he has many things to do and that his being here is hindering doing these activities. Pt is hyperverbal and it is difficult to disrupt his speech to ask assessment questions. He tells me he is "140% disabled with the VBA". Tells me he has "insomnia, hallucinations, ptsd and stuff". States he has created a "fantasy world" due to being incarcerated and in solitary confinement. States he likes to talk to himself because "it's my philosophy in life, find answers to questions". He makes bizarre statements such as "I'm fine because if I wasn't my vibrations wouldn't be good". When asked about substance use, states "you tell me". Alcohol <10. UDS is positive for amphetamines, benzodiazepines, and cannabinoids. He denies suicidal, homicidal ideations. States he never threatened his mother. He denies current auditory visual hallucinations. Pt assessment was terminated as he began to  escalate, becoming increasingly agitated and aggressive.   Collateral w/ pt's mother, IVC petitioner, [phone]. Reports she is at the [facility] right now obtaining a restraining order on pt. Reports at 3AM pt began growling, hollering, screaming from his bedroom. States when she entered pt was laying across his bed, bizarre. Reports pt has been diagnosed with paranoid schizophrenia, talks to himself. Pt told her "I'll kill you and I'll tear things up". States she left, went to the police department. She does not know if pt is taking his medications. Reports she believes pt is using substances. Unknown what substances he might be using. She tells me pt is a heavy daily drinker.  Past Psychiatric History: Hx of TBI, polysubstance abuse, PTSD, MDD  Risk to Self: Yes Risk to Others: Yes Prior Inpatient Therapy: Yes Prior Outpatient Therapy: Yes  Past Medical History:  Past Medical History:  Diagnosis Date   Drug dependence (HCC)    GI bleed    Hiatal hernia    Hypertension    Migraine    PTSD (post-traumatic stress disorder)     Past Surgical History:  Procedure Laterality Date   KNEE SURGERY     Family History:  Family History  Problem Relation Age of Onset   Heart failure Mother    Hypertension Father    Family Psychiatric  History: None reported Social History:  Social History   Substance and Sexual Activity  Alcohol Use Yes   Alcohol/week: 42.0 standard drinks of alcohol   Types: 42 Cans of beer per week   Comment: per pt he drinks 6-12 cans of beer daily     Social History   Substance and Sexual Activity  Drug Use  Yes   Types: Cocaine, Marijuana, Amphetamines    Social History   Socioeconomic History   Marital status: Single    Spouse name: Not on file   Number of children: Not on file   Years of education: Not on file   Highest education level: Not on file  Occupational History   Not on file  Tobacco Use   Smoking status: Every Day     Current packs/day: 0.25    Types: Cigarettes   Smokeless tobacco: Never  Vaping Use   Vaping status: Never Used  Substance and Sexual Activity   Alcohol use: Yes    Alcohol/week: 42.0 standard drinks of alcohol    Types: 42 Cans of beer per week    Comment: per pt he drinks 6-12 cans of beer daily   Drug use: Yes    Types: Cocaine, Marijuana, Amphetamines   Sexual activity: Not Currently  Other Topics Concern   Not on file  Social History Narrative   Not on file   Social Determinants of Health   Financial Resource Strain: Not on file  Food Insecurity: No Food Insecurity (07/01/2023)   Received from [facility]   Hunger Vital Sign    Worried About Running Out of Food in the Last Year: Never true    Ran Out of Food in the Last Year: Never true  Transportation Needs: No Transportation Needs (07/01/2023)   Received from [facility]   PRAPARE - Transportation    Lack of Transportation (Medical): No    Lack of Transportation (Non-Medical): No  Physical Activity: Not on file  Stress: Not on file  Social Connections: Not on file   Additional Social History:    Allergies:   Allergies  Allergen Reactions   Peanut-Containing Drug Products Diarrhea, Anaphylaxis and Swelling   Cheese Diarrhea   Egg-Derived Products Diarrhea    Labs:  No results found for this or any previous visit (from the past 48 hour(s)).   Current Facility-Administered Medications  Medication Dose Route Frequency Provider Last Rate Last Admin   diphenhydrAMINE (BENADRYL) capsule 50 mg  50 mg Oral Q8H PRN Lauree Chandler, NP       Or   diphenhydrAMINE (BENADRYL) injection 50 mg  50 mg Intramuscular Q8H PRN Lauree Chandler, NP   50 mg at 10/14/23 1503   folic acid (FOLVITE) tablet 1 mg  1 mg Oral Daily Lauree Chandler, NP   1 mg at 10/16/23 0757   haloperidol (HALDOL) tablet 5 mg  5 mg Oral Q8H PRN Lauree Chandler, NP       Or   haloperidol lactate (HALDOL) injection 5 mg   5 mg Intramuscular Q8H PRN Lauree Chandler, NP   5 mg at 10/14/23 1502   hydrochlorothiazide (HYDRODIURIL) tablet 12.5 mg  12.5 mg Oral Daily Minna Antis, MD   12.5 mg at 10/16/23 0800   LORazepam (ATIVAN) tablet 2 mg  2 mg Oral Q8H PRN Lauree Chandler, NP   2 mg at 10/16/23 1610   Or   LORazepam (ATIVAN) injection 2 mg  2 mg Intramuscular Q8H PRN Lauree Chandler, NP       LORazepam (ATIVAN) tablet 1-4 mg  1-4 mg Oral Q1H PRN Lauree Chandler, NP       multivitamin with minerals tablet 1 tablet  1 tablet Oral Daily Lauree Chandler, NP   1 tablet at 10/16/23 0757   nicotine (NICODERM CQ - dosed in mg/24  hours) patch 14 mg  14 mg Transdermal Daily Minna Antis, MD       OLANZapine [facility]) tablet 5 mg  5 mg Oral BID Lauree Chandler, NP   5 mg at 10/16/23 1610   thiamine (VITAMIN B1) tablet 100 mg  100 mg Oral Daily Lauree Chandler, NP   100 mg at 10/16/23 9604   Or   thiamine (VITAMIN B1) injection 100 mg  100 mg Intravenous Daily Lauree Chandler, NP       Current Outpatient Medications  Medication Sig Dispense Refill   naloxone (NARCAN) nasal spray 4 mg/0.1 mL Place 1 spray into the nose once. SPRAY 1 SPRAY INTO NOSE AS NEEDED FOR OPIOID OVERDOSE **FOR EMERGENCY USE ONLY!** IN CASE OF EMERGENCY     nicotine (NICODERM CQ - DOSED IN MG/24 HOURS) 14 mg/24hr patch Place 14 mg onto the skin daily.     acetaminophen (TYLENOL) 500 MG tablet Take 500 mg by mouth every 6 (six) hours as needed for mild pain, moderate pain, fever or headache. (Patient not taking: Reported on 10/08/2023)     divalproex (DEPAKOTE) 500 MG DR tablet Take 1 tablet (500 mg total) by mouth every 12 (twelve) hours. (Patient not taking: Reported on 03/18/2023) 60 tablet 1   FLUoxetine (PROZAC) 20 MG capsule Take 1 tablet by mouth daily. (Patient not taking: Reported on 10/08/2023)     gabapentin (NEURONTIN) 300 MG capsule Take 1 capsule (300 mg total) by mouth 3 (three) times daily.  (Patient not taking: Reported on 03/18/2023) 90 capsule 1   hydrochlorothiazide (HYDRODIURIL) 12.5 MG tablet Take 1 tablet (12.5 mg total) by mouth daily. 30 tablet 0   hydrochlorothiazide (HYDRODIURIL) 12.5 MG tablet Take 1 tablet (12.5 mg total) by mouth daily for 2 days. 2 tablet 0   hydrOXYzine (ATARAX) 50 MG tablet Take 1 tablet (50 mg total) by mouth every 4 (four) hours as needed for anxiety. (Patient not taking: Reported on 09/27/2022) 30 tablet 1   ibuprofen (ADVIL) 200 MG tablet Take 200 mg by mouth every 6 (six) hours as needed for fever, headache, mild pain or moderate pain. (Patient not taking: Reported on 10/08/2023)     miconazole (MICOTIN) 2 % powder Apply topically. APPLY MODERATE AMOUNT TOPICALLY TWO TIMES A DAY FOR ATHLETE'S FOOT (Patient not taking: Reported on 10/08/2023)     OLANZapine (ZYPREXA) 5 MG tablet Take 1 tablet (5 mg total) by mouth at bedtime. (Patient not taking: Reported on 09/27/2022) 30 tablet WARM   oxyCODONE (OXY IR/ROXICODONE) 5 MG immediate release tablet Take 1 tablet by mouth every 8 (eight) hours as needed. (Patient not taking: Reported on 10/08/2023)     pantoprazole (PROTONIX) 40 MG tablet Take 2 tablets (80 mg total) by mouth daily. (Patient not taking: Reported on 09/27/2022) 60 tablet 1   traZODone (DESYREL) 100 MG tablet Take 1 tablet (100 mg total) by mouth at bedtime as needed for sleep. (Patient not taking: Reported on 04/01/2022) 30 tablet 1    Musculoskeletal: Strength & Muscle Tone: within normal limits Gait & Station: normal Patient leans: N/A  Psychiatric Specialty Exam: Physical Exam Constitutional:      General: He is not in acute distress.    Appearance: He is not ill-appearing, toxic-appearing or diaphoretic.  HENT:     Head: Normocephalic.     Nose: Nose normal.  Eyes:     General: No scleral icterus. Cardiovascular:     Rate and Rhythm: Normal rate.  Pulmonary:  Effort: Pulmonary effort is normal. No respiratory distress.   Musculoskeletal:        General: Normal range of motion.  Neurological:     General: No focal deficit present.     Mental Status: He is alert and oriented to person, place, and time.  Psychiatric:        Attention and Perception: Perception normal. He is inattentive.        Mood and Affect: Mood is anxious. Affect is labile and angry.        Speech: Speech is rapid and pressured and tangential.        Behavior: Behavior is agitated, aggressive and combative. Behavior is cooperative.        Thought Content: Thought content is delusional.        Judgment: Judgment is impulsive and inappropriate.     Review of Systems  Constitutional:  Negative for chills and fever.  Respiratory:  Negative for shortness of breath.   Cardiovascular:  Negative for chest pain and palpitations.  Gastrointestinal:  Negative for abdominal pain.  Neurological:  Negative for headaches.  Psychiatric/Behavioral:  Positive for substance abuse. The patient is nervous/anxious.     Blood pressure 131/79, pulse 73, temperature 98 F (36.7 C), temperature source Oral, resp. rate 18, SpO2 100%.There is no height or weight on file to calculate BMI.  General Appearance: Fairly Groomed  Eye Contact:  Fair  Speech:  Clear and Coherent  Volume:  Increased  Mood:  Irritable  Affect:  Blunt and Depressed  Thought Process:  Logical  Orientation:  Full (Time, Place, and Person)  Thought Content:  Tangential  Suicidal Thoughts:  No  Homicidal Thoughts:  No  Memory:  Immediate;   Fair Recent;   Fair Remote;   Fair  Judgement:  Fair  Insight:  Lacking  Psychomotor Activity:  Negative  Concentration:  Concentration: Fair and Attention Span: Fair  Recall:  [employer] of Knowledge:  Fair  Language:  Good  Akathisia:  Negative  Handed:  Right  AIMS (if indicated):     Assets:  Physical Health  ADL's:  Intact  Cognition:  WNL  Sleep:   Fair    Physical Exam: Physical Exam Constitutional:      General: He is not  in acute distress.    Appearance: He is not ill-appearing, toxic-appearing or diaphoretic.  HENT:     Head: Normocephalic.     Nose: Nose normal.  Eyes:     General: No scleral icterus. Cardiovascular:     Rate and Rhythm: Normal rate.  Pulmonary:     Effort: Pulmonary effort is normal. No respiratory distress.  Musculoskeletal:        General: Normal range of motion.  Neurological:     General: No focal deficit present.     Mental Status: He is alert and oriented to person, place, and time.  Psychiatric:        Attention and Perception: Perception normal. He is inattentive.        Mood and Affect: Mood is anxious. Affect is labile and angry.        Speech: Speech is rapid and pressured and tangential.        Behavior: Behavior is agitated, aggressive and combative. Behavior is cooperative.        Thought Content: Thought content is delusional.        Judgment: Judgment is impulsive and inappropriate.    Review of Systems  Constitutional:  Negative for  chills and fever.  Respiratory:  Negative for shortness of breath.   Cardiovascular:  Negative for chest pain and palpitations.  Gastrointestinal:  Negative for abdominal pain.  Neurological:  Negative for headaches.  Psychiatric/Behavioral:  Positive for substance abuse. The patient is nervous/anxious.    Blood pressure 131/79, pulse 73, temperature 98 F (36.7 C), temperature source Oral, resp. rate 18, SpO2 100%. There is no height or weight on file to calculate BMI.  Treatment Plan Summary: Daily contact with patient to assess and evaluate symptoms and progress in treatment, Medication management, and Plan    37 y/o male admitted under IVC. On assessment is rambling, tangential, perseverating, hyperverbal, labile. UDS +amphetamines, benzodiazepines and cannabinoids. UDS from 10/07/23 +amphetamines, benzodiazepines, cocaine, cannabinoids. Collateral reports pt is also a heavy daily drinker.  Methamphetamine induced  psychosis: Zyprexa 5mg  oral 2 times daily Gabapentin 300 mg TID  Agitation PRNS: benadryl 50mg  oral or IM every 8 hours PRN agitation; haldol 5mg  oral or IM every 8 hours PRN agitation, ativan 2mg  oral or IM every 8 hours PRN agitation  Alcohol use d/o. -CIWA w/ PRN ativan   Disposition: Recommend psychiatric Inpatient admission when medically cleared.  Nanine Means, NP 10/16/2023 12:38 PM

## 2023-10-16 NOTE — ED Notes (Signed)
The pt in room ED#23 is scheduled to be transferred tonight after 9 pm tonight , however he will not be moving until the morning. All documents have been copied and attached to the pt's chart the The EMTALA, and MED NEC. will need to completed and printed when pt is ready to go.

## 2023-10-16 NOTE — ED Notes (Signed)
Meal provided 

## 2023-10-16 NOTE — BH Assessment (Addendum)
Patient has been accepted to [facility].  Patient assigned to room 506-1 Accepting physician is Dr. Sherron Flemings.  Call report to [phone].  Representative was Augusto Gamble..   ER Staff is aware of it:  Anette, ER Secretary  Nila Nephew., Patient's Nurse      Address: [address],  [place], [place] [address]  Bed available after 9:00pm

## 2023-10-16 NOTE — BH Assessment (Signed)
TTS followed up with [facility]. Per [employer] Tresa Endo Richie.[phone]), her notes state the patient was referred to [facility] because they don't have any beds. However, [place], told TTS to refer to [facility] because they didn't have beds.

## 2023-10-16 NOTE — Plan of Care (Signed)
CHL Tonsillectomy/Adenoidectomy, Postoperative PEDS care plan entered in error.

## 2023-10-16 NOTE — BH Assessment (Signed)
Writer spoke with the patient to complete an updated/reassessment. Patient denies SI/HI and AV/H. However, patient is tangential, when asked questions, his answers are focused on how he want to be discharge and "the lady that supposedly gave birth to me" is the reason he was brought to the ER.

## 2023-10-16 NOTE — ED Notes (Signed)
Pt expressing irritability and agitation. Pt requesting PRN for agitation. Took all AM meds together per request. Pt asking when he can leave, explained process and expectations of behavior while in the ED.

## 2023-10-16 NOTE — ED Provider Notes (Signed)
-----------------------------------------   6:09 AM on 10/16/2023 -----------------------------------------   Blood pressure 111/73, pulse 74, temperature 98.7 F (37.1 C), temperature source Oral, resp. rate 18, SpO2 98%.  The patient is calm and cooperative at this time.  There have been no acute events since the last update.  Awaiting disposition plan from psych team.   Delton Prairie, MD 10/16/23 [phone]

## 2023-10-16 NOTE — BH Assessment (Signed)
TTS updated [facility] AC Debbe Bales M.) about the patient needing inpatient treatment and no available beds with [facility].

## 2023-10-16 NOTE — ED Notes (Addendum)
Pt is resting in bed. This Nt will attempt vitals and give snack when Pt awaken.

## 2023-10-17 ENCOUNTER — Inpatient Hospital Stay (HOSPITAL_COMMUNITY)
Admission: AD | Admit: 2023-10-17 | Discharge: 2023-10-23 | DRG: 897 | Disposition: A | Discharge status: Home / Self Care | Payer: Non-veteran care | Source: Intra-hospital | Attending: Psychiatry | Admitting: Psychiatry

## 2023-10-17 ENCOUNTER — Other Ambulatory Visit: Payer: Self-pay

## 2023-10-17 ENCOUNTER — Encounter (HOSPITAL_COMMUNITY): Payer: Self-pay | Admitting: Psychiatry

## 2023-10-17 DIAGNOSIS — Z91011 Allergy to milk products: Secondary | ICD-10-CM | POA: Diagnosis not present

## 2023-10-17 DIAGNOSIS — F200 Paranoid schizophrenia: Secondary | ICD-10-CM | POA: Diagnosis present

## 2023-10-17 DIAGNOSIS — Z7151 Drug abuse counseling and surveillance of drug abuser: Secondary | ICD-10-CM

## 2023-10-17 DIAGNOSIS — F32A Depression, unspecified: Secondary | ICD-10-CM | POA: Diagnosis present

## 2023-10-17 DIAGNOSIS — Z87820 Personal history of traumatic brain injury: Secondary | ICD-10-CM | POA: Diagnosis not present

## 2023-10-17 DIAGNOSIS — G4700 Insomnia, unspecified: Secondary | ICD-10-CM | POA: Diagnosis present

## 2023-10-17 DIAGNOSIS — F1010 Alcohol abuse, uncomplicated: Secondary | ICD-10-CM | POA: Diagnosis present

## 2023-10-17 DIAGNOSIS — F15959 Other stimulant use, unspecified with stimulant-induced psychotic disorder, unspecified: Principal | ICD-10-CM

## 2023-10-17 DIAGNOSIS — Z79899 Other long term (current) drug therapy: Secondary | ICD-10-CM | POA: Diagnosis not present

## 2023-10-17 DIAGNOSIS — F4310 Post-traumatic stress disorder, unspecified: Secondary | ICD-10-CM | POA: Diagnosis present

## 2023-10-17 DIAGNOSIS — F17210 Nicotine dependence, cigarettes, uncomplicated: Secondary | ICD-10-CM | POA: Diagnosis present

## 2023-10-17 DIAGNOSIS — I10 Essential (primary) hypertension: Secondary | ICD-10-CM | POA: Diagnosis present

## 2023-10-17 DIAGNOSIS — Z716 Tobacco abuse counseling: Secondary | ICD-10-CM

## 2023-10-17 DIAGNOSIS — Z91012 Allergy to eggs: Secondary | ICD-10-CM

## 2023-10-17 DIAGNOSIS — Z91010 Allergy to peanuts: Secondary | ICD-10-CM | POA: Diagnosis not present

## 2023-10-17 DIAGNOSIS — Z87892 Personal history of anaphylaxis: Secondary | ICD-10-CM

## 2023-10-17 DIAGNOSIS — F309 Manic episode, unspecified: Secondary | ICD-10-CM | POA: Diagnosis not present

## 2023-10-17 DIAGNOSIS — F15159 Other stimulant abuse with stimulant-induced psychotic disorder, unspecified: Secondary | ICD-10-CM | POA: Diagnosis present

## 2023-10-17 MED ORDER — DIPHENHYDRAMINE HCL 25 MG PO CAPS
50.0000 mg | ORAL_CAPSULE | Freq: Three times a day (TID) | ORAL | Status: DC | PRN
  Administered 2023-10-17 – 2023-10-21 (×3): 50 mg via ORAL
  Filled 2023-10-17 (×3): qty 2

## 2023-10-17 MED ORDER — THIAMINE HCL 100 MG/ML IJ SOLN: 100.0000 mg | Freq: Every day | INTRAMUSCULAR | Status: DC

## 2023-10-17 MED ORDER — GABAPENTIN 300 MG PO CAPS
300.0000 mg | ORAL_CAPSULE | Freq: Three times a day (TID) | ORAL | Status: DC
  Administered 2023-10-17 – 2023-10-23 (×17): 300 mg via ORAL
  Filled 2023-10-17 (×23): qty 1

## 2023-10-17 MED ORDER — PANTOPRAZOLE SODIUM 40 MG PO TBEC
40.0000 mg | DELAYED_RELEASE_TABLET | Freq: Every day | ORAL | Status: DC
  Administered 2023-10-17 – 2023-10-23 (×7): 40 mg via ORAL
  Filled 2023-10-17 (×10): qty 1

## 2023-10-17 MED ORDER — HALOPERIDOL LACTATE 5 MG/ML IJ SOLN: 5.0000 mg | Freq: Three times a day (TID) | INTRAMUSCULAR | Status: DC | PRN

## 2023-10-17 MED ORDER — VITAMIN B-1 100 MG PO TABS
100.0000 mg | ORAL_TABLET | Freq: Every day | ORAL | Status: DC
  Administered 2023-10-17 – 2023-10-23 (×5): 100 mg via ORAL
  Filled 2023-10-17 (×8): qty 1

## 2023-10-17 MED ORDER — DIPHENHYDRAMINE HCL 50 MG/ML IJ SOLN: 50.0000 mg | Freq: Three times a day (TID) | INTRAMUSCULAR | Status: DC | PRN

## 2023-10-17 MED ORDER — OLANZAPINE 5 MG PO TABS
5.0000 mg | ORAL_TABLET | Freq: Two times a day (BID) | ORAL | Status: DC
  Administered 2023-10-17 – 2023-10-18 (×2): 5 mg via ORAL
  Filled 2023-10-17 (×6): qty 1

## 2023-10-17 MED ORDER — LORAZEPAM 1 MG PO TABS: 1.0000 mg | ORAL_TABLET | ORAL | Status: AC | PRN

## 2023-10-17 MED ORDER — HYDROCHLOROTHIAZIDE 12.5 MG PO TABS
12.5000 mg | ORAL_TABLET | Freq: Every day | ORAL | Status: DC
  Administered 2023-10-17 – 2023-10-23 (×4): 12.5 mg via ORAL
  Filled 2023-10-17 (×8): qty 1

## 2023-10-17 MED ORDER — LORAZEPAM 2 MG/ML IJ SOLN: 2.0000 mg | Freq: Three times a day (TID) | INTRAMUSCULAR | Status: DC | PRN

## 2023-10-17 MED ORDER — NICOTINE 14 MG/24HR TD PT24
14.0000 mg | MEDICATED_PATCH | Freq: Every day | TRANSDERMAL | Status: DC
  Administered 2023-10-18 – 2023-10-23 (×6): 14 mg via TRANSDERMAL
  Filled 2023-10-17 (×8): qty 1

## 2023-10-17 MED ORDER — FOLIC ACID 1 MG PO TABS
1.0000 mg | ORAL_TABLET | Freq: Every day | ORAL | Status: DC
  Administered 2023-10-17 – 2023-10-23 (×5): 1 mg via ORAL
  Filled 2023-10-17 (×8): qty 1

## 2023-10-17 MED ORDER — TRAZODONE HCL 50 MG PO TABS
50.0000 mg | ORAL_TABLET | Freq: Every evening | ORAL | Status: DC | PRN
  Administered 2023-10-17 – 2023-10-19 (×2): 50 mg via ORAL
  Filled 2023-10-17 (×4): qty 1

## 2023-10-17 MED ORDER — HYDROXYZINE HCL 50 MG PO TABS
50.0000 mg | ORAL_TABLET | Freq: Three times a day (TID) | ORAL | Status: DC | PRN
  Administered 2023-10-19 – 2023-10-22 (×4): 50 mg via ORAL
  Filled 2023-10-17 (×6): qty 1

## 2023-10-17 MED ORDER — ADULT MULTIVITAMIN W/MINERALS CH
1.0000 | ORAL_TABLET | Freq: Every day | ORAL | Status: DC
  Administered 2023-10-17 – 2023-10-23 (×5): 1 via ORAL
  Filled 2023-10-17 (×8): qty 1

## 2023-10-17 MED ORDER — HALOPERIDOL 5 MG PO TABS
5.0000 mg | ORAL_TABLET | Freq: Three times a day (TID) | ORAL | Status: DC | PRN
  Administered 2023-10-17 – 2023-10-18 (×2): 5 mg via ORAL
  Filled 2023-10-17 (×3): qty 1

## 2023-10-17 MED ORDER — ALUM & MAG HYDROXIDE-SIMETH 200-200-20 MG/5ML PO SUSP
30.0000 mL | ORAL | Status: DC | PRN
  Administered 2023-10-17: 30 mL via ORAL
  Filled 2023-10-17: qty 30

## 2023-10-17 MED ORDER — LORAZEPAM 1 MG PO TABS
2.0000 mg | ORAL_TABLET | Freq: Three times a day (TID) | ORAL | Status: DC | PRN
  Administered 2023-10-17 – 2023-10-21 (×3): 2 mg via ORAL
  Filled 2023-10-17 (×3): qty 2

## 2023-10-17 MED ORDER — ACETAMINOPHEN 325 MG PO TABS: 650.0000 mg | ORAL_TABLET | Freq: Four times a day (QID) | ORAL | Status: DC | PRN

## 2023-10-17 MED ORDER — OLANZAPINE 5 MG PO TABS
5.0000 mg | ORAL_TABLET | Freq: Two times a day (BID) | ORAL | Status: DC
  Filled 2023-10-17 (×4): qty 1

## 2023-10-17 MED ORDER — HYDROXYZINE HCL 25 MG PO TABS
25.0000 mg | ORAL_TABLET | Freq: Three times a day (TID) | ORAL | Status: DC | PRN
Start: 2023-10-17 — End: 2023-10-17

## 2023-10-17 NOTE — [facility] Group Notes (Signed)
[facility] Group Notes:  (Nursing/MHT/Case Management/Adjunct)  Date:  10/17/2023  Time:  9:29 PM  Type of Therapy:   Wrap-up group  Participation Level:  Did Not Attend  Participation Quality:    Affect:    Cognitive:    Insight:    Engagement in Group:    Modes of Intervention:    Summary of Progress/Problems: Pt refused to attend group. Writer provided him with a worksheet on coping skills.   Jeremy Frank 10/17/2023, 9:29 PM

## 2023-10-17 NOTE — ED Notes (Signed)
Pt is A/Ox 3, Jeremy Frank declines any SI/HI stated that he does not have A/V hallucinations.  However, pt is slow to respond as well as verbalizing that he "wants to get rid of that imposter at his house" Report called to [facility] @ [phone]  All Belongings accounted for and given to [facility] Safe transport  Pt left IVC via [facility] transport

## 2023-10-17 NOTE — [facility] Group Notes (Signed)
[facility] Group Notes:  (Nursing/MHT/Case Management/Adjunct)  Date:  10/17/2023  Time:  2:31 PM  Type of Therapy:  Psychoeducational Skills  Participation Level:  Did Not Attend  Participation Quality:  na  Affect:  na  Cognitive:  na  Insight:  None  Engagement in Group:  na  Modes of Intervention:  na  Summary of Progress/Problems: pt did not attend afternoon RN group.   Malva Limes 10/17/2023, 2:31 PM

## 2023-10-17 NOTE — ED Notes (Signed)
Pt requested medication for agitation then when staff brought PRN medication as well as scheduled daily medication pt only took gabapentin (see documentation)  will continue to encourage medication compliance

## 2023-10-17 NOTE — Tx Team (Signed)
Initial Treatment Plan 10/17/2023 1:17 PM Jeremy Frank ZOX:[id number]    PATIENT STRESSORS: Marital or family conflict   Medication change or noncompliance   Substance abuse   Traumatic event     PATIENT STRENGTHS: Capable of independent living  Supportive family/friends  Work skills    PATIENT IDENTIFIED PROBLEMS: Mood lability (Angry, restless /anxious & abusive) towards family & staff.    Substance Abuse (UDS done on 10/13/20 + for amphetamine, cannabis & benzos)     Medication noncompliance     Insomnia          DISCHARGE CRITERIA:  Improved stabilization in mood, thinking, and/or behavior Verbal commitment to aftercare and medication compliance Withdrawal symptoms are absent or subacute and managed without 24-hour nursing intervention  PRELIMINARY DISCHARGE PLAN: Outpatient therapy Return to previous living arrangement Return to previous work or school arrangements  PATIENT/FAMILY INVOLVEMENT: This treatment plan has been presented to and reviewed with the patient, Jeremy Frank. The patient have been given the opportunity to ask questions and make suggestions.  Sherryl Manges, RN 10/17/2023, 1:17 PM

## 2023-10-17 NOTE — Plan of Care (Signed)
  Problem: Education: Goal: Emotional status will improve Outcome: Not Progressing   Problem: Activity: Goal: Interest or engagement in activities will improve Outcome: Not Progressing

## 2023-10-17 NOTE — ED Provider Notes (Signed)
Emergency Medicine Observation Re-evaluation Note  Jeremy Frank is a 37 y.o. male, seen on rounds today.  Pt initially presented to the ED for complaints of Manic Behavior Currently, the patient is resting.  Physical Exam  BP 115/71 (BP Location: Right Arm)   Pulse 63   Temp 97.6 F (36.4 C) (Oral)   Resp 16   SpO2 100%  Physical Exam Gen:  No acute distress Resp:  Breathing easily and comfortably, no accessory muscle usage Neuro:  Moving all four extremities, no gross focal neuro deficits Psych:  Resting currently, calm when awake  ED Course / MDM  EKG:  I have reviewed the labs performed to date as well as medications administered while in observation.  Recent changes in the last 24 hours include no significant changes.  Plan  Current plan is for psych placement.    Loleta Rose, MD 10/17/23 [phone]

## 2023-10-17 NOTE — [facility] Counselor (Signed)
CSW attempt to do assessment patient did not respond, also tech stated that the physician had attempted as well. Will resume tomorrow.

## 2023-10-17 NOTE — [facility] Suicide Risk Assessment (Signed)
[facility] Admission Suicide Risk Assessment   Nursing information obtained from:  Review of record Demographic factors:  Male, Adolescent or young adult Current Mental Status:  NA Loss Factors:  Decrease in vocational status Historical Factors:  Impulsivity Risk Reduction Factors:  Positive social support, Living with another person, especially a relative  Total Time spent with patient: 15 minutes 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(HCC) Diagnosis:  Principal Problem:   Methamphetamine-induced psychotic disorder (HCC)  Subjective Data: See H&P Patient manage for psychosis and aggression.  He is under IVC.  Patient received agitation protocol medications due to threats of aggression towards his roommate upon admission, and was not able to participate due to sedation, during the assessment on day of admission.  Continued Clinical Symptoms:    The "Alcohol Use Disorders Identification Test", Guidelines for Use in Primary Care, Second Edition.  World Science writer [facility]). Score between 0-7:  no or low risk or alcohol related problems. Score between 8-15:  moderate risk of alcohol related problems. Score between 16-19:  high risk of alcohol related problems. Score 20 or above:  warrants further diagnostic evaluation for alcohol dependence and treatment.   CLINICAL FACTORS:   Alcohol/Substance Abuse/Dependencies Schizophrenia:    Previous Psychiatric Diagnoses and Treatments   Musculoskeletal: Strength & Muscle Tone: Laying in bed   Gait & Station: Laying in bed   Patient leans: Laying in bed    Psychiatric Specialty Exam:  Presentation  General Appearance:  -- (laying in bed, turned away form Clinical research associate)  Eye Contact: Poor  Speech: Slow  Speech Volume: Decreased  Handedness: Right   Mood and Affect  Mood: -- (laying in bed, sleeping)  Affect: -- (laying in bed, sleeping off and on, not participating in the interview)   Thought  Process  Thought Processes: -- (laying in bed, sleeping off and on, not participating in the interview)  Descriptions of Associations:-- (laying in bed, sleeping off and on, not participating in the interview)  Orientation:-- (laying in bed, sleeping off and on, not participating in the interview)  Thought Content:-- (laying in bed, sleeping off and on, not participating in the interview)  History of Schizophrenia/Schizoaffective disorder:No data recorded Duration of Psychotic Symptoms:Greater than six months  Hallucinations:Hallucinations: -- (laying in bed, sleeping off and on, not participating in the interview)  Ideas of Reference:-- (laying in bed, sleeping off and on, not participating in the interview)  Suicidal Thoughts:Suicidal Thoughts: -- (laying in bed, sleeping off and on, not participating in the interview)  Homicidal Thoughts:Homicidal Thoughts: -- (threatening to harm roommate)   Sensorium  Memory: -- (laying in bed, sleeping off and on, not participating in the interview)  Judgment: Poor  Insight: -- (laying in bed, sleeping off and on, not participating in the interview)   Executive Functions  Concentration: Poor  Attention Span: Poor  Recall: Poor  Fund of Knowledge: Fair  Language: Fair   Psychomotor Activity  Psychomotor Activity: Psychomotor Activity: Normal   Assets  Assets: Financial Resources/Insurance; Resilience   Sleep  Sleep: Sleep: Good    Physical Exam: Physical Exam See H&P   ROS See H&P   Blood pressure 133/70, pulse 74, temperature 98.4 F (36.9 C), temperature source Oral, resp. rate 20, height 5\' 6"  (1.676 m), weight 114.1 kg, SpO2 100%. Body mass index is 40.61 kg/m.   COGNITIVE FEATURES THAT CONTRIBUTE TO RISK:  Closed-mindedness    SUICIDE RISK:   Moderate:  No current suicidal ideation based on records. Poor self-control, limited dysphoria/symptomatology, some risk  factors present, and identifiable  protective factors, including available and accessible social support.  PLAN OF CARE: See H&P   I certify that inpatient services furnished can reasonably be expected to improve the patient's condition.   Phineas Inches, MD 10/17/2023, 3:05 PM
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
## 2023-10-17 NOTE — ED Notes (Signed)
Report Given to Lissa Hoard. @ [facility]

## 2023-10-17 NOTE — ED Notes (Signed)
Pt has been transferred.
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
## 2023-10-17 NOTE — H&P (Signed)
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  Patient was medically cleared by outside hospital and transferred to this behavioral health hospital.  On admission to the psychiatric unit, the patient was very agitated, requiring agitation as needed medication which she specifically asked for and took via P.O. Per nurse: Patient presented agitated on the unit- cursing at staff- verbally threatened to beat up his room mate if he "had to." Pt agreed to po agitation meds. Stated he wanted to be left alone. Stated that all he wants to do while here " is eat, shit, and sleep."   I attempted to interview the patient twice at separate times this afternoon, the patient was asleep and did not participate in the interview due to sedation, likely due to agitation protocol medications.  Specifically he was not able to comment on any psychiatric history, past medical history, social history, substance use, family history, or any elements of the HPI.  Per admitting nurse: Pt admitted to [facility] under IVC status from [facility] where he presented initially for manic behavior, insomnia, threatening towards mom (believed she was an Energy manager) in the context of medication noncompliance. On arrival to [facility] pt noted to be disheveled  in ripped scrubs, defensive, dominating, demanding with pressured and abusive speech. Blames mother for taking out the IVC petition "I get IVC'd by an ex-crack head, she just IVC me whenever she wants  to. What happens to fucking car now. I really don't want to or need to be in here. I was not hallucinating anything before I went to [place] or even now. I need to get the fuck out of here. I'm not signing no fucking papers or answering any questions". Pt was yelling at staff, cursing, unwilling to comply with admission assessment; required multiple prompts to have skin assessment done. Refused to signed admission documents. Skin assessment completed without areas of breakdown to note. Tattoos noted to right arm. All belongings searched, items deemed contraband secured in assigned locker. Ambulatory to unit with a steady gait. Unit orientation done, routines discussed, care plan reviewed with pt without learning as he continues to demand discharge "I don't want to fucking be here. I want to leave. I don't give a fuck about y'all papers". Safety checks initiated at Q 15 minutes intervals. Observed by assigned nurse threatening to beat up his room mate mate on arrival to unit.   Per the ED documentation: Jeremy Frank is a 37 y.o. male patient admitted under IVC petition. Client seen at bedside, when asked about mood today client reports, "Terrible, I am here". Client began rasing voice and getting irritable stating "I came here sober". It was apparent the questions where upsetting the client, interview ended as he was escalating, inpatient recommended.  [facility] [place] [facility] [place] [facility] is being sought.   Credited to Darrick Grinder, PMHNP: HPI on admission: Pt chart reviewed and seen at bedside. Pt is disorganized, rambling, tangential, perseverating. Presents with labile affect. States he should not have been involuntarily committed and that his mother is more of a roommate than a mother. Reports he has  many things to do and that his being here is hindering doing these activities. Pt is hyperverbal and it is difficult to disrupt his speech to ask assessment questions. 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 Tells me he has "insomnia, hallucinations, ptsd and stuff". States he has created a "fantasy world" due to being incarcerated and in solitary confinement. States he likes to talk to himself because "it's my philosophy in life, find answers to questions". He makes bizarre statements such as "I'm fine because if I wasn't my vibrations wouldn't be good". When asked about substance use, states "you tell me". Alcohol <10. UDS is positive for amphetamines, benzodiazepines, and cannabinoids. He denies suicidal, homicidal ideations. States he never threatened his mother. He denies current auditory visual hallucinations. Pt assessment was terminated as he began to escalate, becoming increasingly agitated and aggressive.    Collateral w/ pt's mother, IVC petitioner, [phone]. Reports she is at the [facility] right now obtaining a restraining order on pt. Reports at 3AM pt began growling, hollering, screaming from his bedroom. States when she entered pt was laying across his bed, bizarre. Reports pt has been diagnosed with paranoid schizophrenia, talks to himself. Pt told her "I'll kill you and I'll tear things up". States she left, went to the police department. She does not know if pt is taking his medications. Reports she believes pt is using substances. Unknown what substances he might be using. She tells me pt is a heavy daily drinker.   Total Time spent with patient: <5 minutes  Past Psychiatric History: Could not be obtained due to sedation  Is the patient at risk to self? Could not be obtained due to sedation  Has the patient been a risk to self in the past 6 months? Could not be obtained due to sedation  Has the patient been a risk to self within the distant past? Could not be obtained due to sedation  Is the patient a risk to others? Could not be obtained due to sedation  Has the patient been a risk to others in the past 6 months? Could not be obtained due to  sedation  Has the patient been a risk to others within the distant past? Could not be obtained due to sedation    [place] Scale:  Flowsheet Row Ad[facility] ([facility]) [facility] from 10/14/2023 in [facility] at [facility] ED from 10/07/2023 in [facility] at [facility]  C-SSRS RISK CATEGORY No Risk No Risk High Risk        Prior Inpatient Therapy: Could not be obtained due to sedation If yes, describe  Prior Outpatient Therapy: Could not be obtained due to sedation If yes, describe   Alcohol Screening: Patient refused Alcohol Screening Tool: Yes (Pt refused to answer when asked) Alcohol Brief Interventions/Follow-up: Patient Refused ("I'm not answering shift, I don't give a fuck about that") Substance Abuse History in the last 12 months:  Yes.   Consequences of Substance Abuse: Negative Previous Psychotropic Medications: Yes  Psychological Evaluations: Yes  Past Medical History:  Past Medical History:  Diagnosis Date   Drug dependence (HCC)    GI bleed    Hiatal hernia    Hypertension    Migraine    PTSD (post-traumatic stress disorder)     Past Surgical History:  Procedure Laterality Date   KNEE SURGERY     Family History:  Family History  Problem Relation Age of Onset   Heart failure Mother    Hypertension Father    Family Psychiatric  History: Could not be obtained due to sedation Tobacco Screening:  Social History   Tobacco Use  Smoking Status Every Day   Current packs/day: 0.25   Types: Cigarettes  Smokeless Tobacco Never    BH Tobacco Counseling     Are you interested in Tobacco Cessation Medications?  No, patient refused Counseled patient on smoking cessation:  Yes Reason Tobacco Screening Not Completed: Patient Refused Screening       Social History:  Social History   Substance and Sexual Activity  Alcohol Use Yes   Alcohol/week: 42.0  standard drinks of alcohol   Types: 42 Cans of beer per week   Comment: per pt he drinks 6-12 cans of beer daily     Social History   Substance and Sexual Activity  Drug Use Yes   Types: Cocaine, Marijuana, Amphetamines    Additional Social History:                           Allergies:   Allergies  Allergen Reactions   Peanut-Containing Drug Products Diarrhea, Anaphylaxis and Swelling   Cheese Diarrhea   Egg-Derived Products Diarrhea   Lab Results: No results found for this or any previous visit (from the past 48 hour(s)).  Blood Alcohol level:  Lab Results  Component Value Date   ETH <10 10/14/2023   ETH <10 10/07/2023    Metabolic Disorder Labs:  Lab Results  Component Value Date   HGBA1C 5.1 02/11/2022   MPG 99.67 02/11/2022   MPG 119.76 06/19/2021   No results found for: "PROLACTIN" Lab Results  Component Value Date   CHOL 165 02/11/2022   TRIG 81 02/11/2022   HDL 47 02/11/2022   CHOLHDL 3.5 02/11/2022   VLDL 16 02/11/2022   LDLCALC 102 (H) 02/11/2022   LDLCALC 72 06/19/2021    Current Medications: Current Facility-Administered Medications  Medication Dose Route Frequency Provider Last Rate Last Admin   acetaminophen (TYLENOL) tablet 650 mg  650 mg Oral Q6H PRN Charm Rings, NP       alum & mag hydroxide-simeth (MAALOX/MYLANTA) 200-200-20 MG/5ML suspension 30 mL  30 mL Oral Q4H PRN Charm Rings, NP   30 mL at 10/17/23 1252   diphenhydrAMINE (BENADRYL) capsule 50 mg  50 mg Oral Q8H PRN Charm Rings, NP   50 mg at 10/17/23 1225   Or   diphenhydrAMINE (BENADRYL) injection 50 mg  50 mg Intramuscular Q8H PRN Charm Rings, NP       folic acid (FOLVITE) tablet 1 mg  1 mg Oral Daily Lord, Jamison Y, NP       gabapentin (NEURONTIN) capsule 300 mg  300 mg Oral TID Charm Rings, NP   300 mg at 10/17/23 1225   haloperidol (HALDOL) tablet 5 mg  5 mg Oral Q8H PRN Charm Rings, NP   5 mg at 10/17/23 1225   Or   haloperidol lactate (HALDOL)  injection 5 mg  5 mg Intramuscular Q8H PRN Charm Rings, NP       hydrochlorothiazide (HYDRODIURIL) tablet 12.5 mg  12.5 mg Oral Daily Charm Rings, NP       hydrOXYzine (ATARAX) tablet 50 mg  50 mg Oral TID PRN Zyann Mabry, Harrold Donath, MD       LORazepam (ATIVAN) tablet 2 mg  2 mg Oral Q8H  PRN Charm Rings, NP   2 mg at 10/17/23 1225   Or   LORazepam (ATIVAN) injection 2 mg  2 mg Intramuscular Q8H PRN Charm Rings, NP       LORazepam (ATIVAN) tablet 1-4 mg  1-4 mg Oral Q1H PRN Charm Rings, NP       multivitamin with minerals tablet 1 tablet  1 tablet Oral Daily Lord, Jamison Y, NP       nicotine (NICODERM CQ - dosed in mg/24 hours) patch 14 mg  14 mg Transdermal Daily Lord, Jamison Y, NP       OLANZapine (ZYPREXA) tablet 5 mg  5 mg Oral Q12H Willford Rabideau, MD       pantoprazole (PROTONIX) EC tablet 40 mg  40 mg Oral Daily Armstrong Creasy, MD       thiamine (Vitamin B-1) tablet 100 mg  100 mg Oral Daily Charm Rings, NP       Or   thiamine (VITAMIN B1) injection 100 mg  100 mg Intravenous Daily Charm Rings, NP       traZODone (DESYREL) tablet 50 mg  50 mg Oral QHS PRN Phineas Inches, MD       PTA Medications: Medications Prior to Admission  Medication Sig Dispense Refill Last Dose   acetaminophen (TYLENOL) 500 MG tablet Take 500 mg by mouth every 6 (six) hours as needed for mild pain, moderate pain, fever or headache. (Patient not taking: Reported on 10/08/2023)      divalproex (DEPAKOTE) 500 MG DR tablet Take 1 tablet (500 mg total) by mouth every 12 (twelve) hours. (Patient not taking: Reported on 03/18/2023) 60 tablet 1    FLUoxetine (PROZAC) 20 MG capsule Take 1 tablet by mouth daily. (Patient not taking: Reported on 10/08/2023)      gabapentin (NEURONTIN) 300 MG capsule Take 1 capsule (300 mg total) by mouth 3 (three) times daily. (Patient not taking: Reported on 03/18/2023) 90 capsule 1    hydrochlorothiazide (HYDRODIURIL) 12.5 MG tablet Take 1 tablet (12.5 mg  total) by mouth daily. 30 tablet 0    hydrochlorothiazide (HYDRODIURIL) 12.5 MG tablet Take 1 tablet (12.5 mg total) by mouth daily for 2 days. 2 tablet 0    hydrOXYzine (ATARAX) 50 MG tablet Take 1 tablet (50 mg total) by mouth every 4 (four) hours as needed for anxiety. (Patient not taking: Reported on 09/27/2022) 30 tablet 1    ibuprofen (ADVIL) 200 MG tablet Take 200 mg by mouth every 6 (six) hours as needed for fever, headache, mild pain or moderate pain. (Patient not taking: Reported on 10/08/2023)      miconazole (MICOTIN) 2 % powder Apply topically. APPLY MODERATE AMOUNT TOPICALLY TWO TIMES A DAY FOR ATHLETE'S FOOT (Patient not taking: Reported on 10/08/2023)      naloxone (NARCAN) nasal spray 4 mg/0.1 mL Place 1 spray into the nose once. SPRAY 1 SPRAY INTO NOSE AS NEEDED FOR OPIOID OVERDOSE **FOR EMERGENCY USE ONLY!** IN CASE OF EMERGENCY      nicotine (NICODERM CQ - DOSED IN MG/24 HOURS) 14 mg/24hr patch Place 14 mg onto the skin daily.      OLANZapine (ZYPREXA) 5 MG tablet Take 1 tablet (5 mg total) by mouth at bedtime. (Patient not taking: Reported on 09/27/2022) 30 tablet WARM    oxyCODONE (OXY IR/ROXICODONE) 5 MG immediate release tablet Take 1 tablet by mouth every 8 (eight) hours as needed. (Patient not taking: Reported on 10/08/2023)      pantoprazole (  PROTONIX) 40 MG tablet Take 2 tablets (80 mg total) by mouth daily. (Patient not taking: Reported on 09/27/2022) 60 tablet 1    traZODone (DESYREL) 100 MG tablet Take 1 tablet (100 mg total) by mouth at bedtime as needed for sleep. (Patient not taking: Reported on 04/01/2022) 30 tablet 1     Musculoskeletal: Strength & Muscle Tone: Laying in bed   Gait & Station: Laying in bed   Patient leans: Laying in bed           Psychiatric Specialty Exam:  Presentation  General Appearance:  -- (laying in bed, turned away form Clinical research associate)  Eye Contact: Poor  Speech: Slow  Speech Volume: Decreased  Handedness: Right   Mood and  Affect  Mood: -- (laying in bed, sleeping)  Affect: -- (laying in bed, sleeping off and on, not participating in the interview)   Thought Process  Thought Processes: -- (laying in bed, sleeping off and on, not participating in the interview)  Duration of Psychotic Symptoms: Could not be obtained due to sedation Past Diagnosis of Schizophrenia or Psychoactive disorder: No data recorded Descriptions of Associations:-- (laying in bed, sleeping off and on, not participating in the interview)  Orientation:-- (laying in bed, sleeping off and on, not participating in the interview)  Thought Content:-- (laying in bed, sleeping off and on, not participating in the interview)  Hallucinations:Hallucinations: -- (laying in bed, sleeping off and on, not participating in the interview)  Ideas of Reference:-- (laying in bed, sleeping off and on, not participating in the interview)  Suicidal Thoughts:Suicidal Thoughts: -- (laying in bed, sleeping off and on, not participating in the interview)  Homicidal Thoughts:Homicidal Thoughts: -- (threatening to harm roommate)   Sensorium  Memory: -- (laying in bed, sleeping off and on, not participating in the interview)  Judgment: Poor  Insight: -- (laying in bed, sleeping off and on, not participating in the interview)   Executive Functions  Concentration: Poor  Attention Span: Poor  Recall: Poor  Fund of Knowledge: Fair  Language: Fair   Psychomotor Activity  Psychomotor Activity: Psychomotor Activity: Normal   Assets  Assets: Financial Resources/Insurance; Resilience   Sleep  Sleep: Sleep: Good    Physical Exam: Physical Exam Vitals reviewed.  Constitutional:      General: He is not in acute distress.    Appearance: He is not toxic-appearing.  Pulmonary:     Effort: Pulmonary effort is normal.    Review of Systems  Psychiatric/Behavioral:  Positive for substance abuse. The patient does not have insomnia.     Blood pressure 133/70, pulse 74, temperature 98.4 F (36.9 C), temperature source Oral, resp. rate 20, height 5\' 6"  (1.676 m), weight 114.1 kg, SpO2 100%. Body mass index is 40.61 kg/m.  Treatment Plan Summary: Daily contact with patient to assess and evaluate symptoms and progress in treatment and Medication management  ASSESSMENT:  Diagnoses / Active Problems: History of schizophrenia. 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 Psychiatric Diagnoses and Treatment:    -Continue zyprexa 5 mg bid that was started from ED for psychosis -Restart home  gabapentin 300 mg tid   -start atarax PRN and trazodone PRN  -Continue ativan PRN for any alcohol w/d   -Start Unit restrictions due to threat of violence towards roommate (roommate moved to a different hall).   --  The risks/benefits/side-effects/alternatives to this medication were discussed in detail with the patient and time was given for questions. The patient consents to medication trial.    -- Metabolic profile and EKG monitoring obtained while on an atypical antipsychotic (BMI: Lipid Panel: HbgA1c: QTc:)   -- Encouraged patient to participate in unit milieu and in scheduled group therapies   -- Short Term Goals: Ability to identify changes in lifestyle to reduce recurrence of condition will improve, Ability to verbalize feelings will improve, Ability to disclose and discuss suicidal ideas, Ability to demonstrate self-control will improve, Ability to identify and develop effective coping behaviors will  improve, Ability to maintain clinical measurements within normal limits will improve, Compliance with prescribed medications will improve, and Ability to identify triggers associated with substance abuse/mental health issues will improve  -- Long Term Goals: Improvement in symptoms so as ready for discharge    3. Medical Issues Being Addressed:      4. Discharge Planning:   -- Social work and case management to assist with discharge planning and identification of hospital follow-up needs prior to discharge  -- Estimated LOS: 5-7 days  -- Discharge Concerns: Need to establish a safety plan; Medication compliance and effectiveness  -- Discharge Goals: Return home with outpatient referrals for mental health follow-up including medication management/psychotherapy   I certify that inpatient services furnished can reasonably be expected to improve the patient's condition.    Phineas Inches, MD 10/19/20243:07 PM  Total Time Spent in Direct Patient Care:  I personally spent 30 minutes on the unit in direct patient care. The direct patient care time included face-to-face time with the patient, reviewing the patient's chart, communicating with other professionals, and coordinating care. Greater than 50% of this time was spent in counseling or coordinating care with the patient regarding goals of hospitalization, psycho-education, and discharge planning needs.   Phineas Inches, MD Psychiatrist

## 2023-10-17 NOTE — Progress Notes (Signed)
   10/17/23 2020  Psych Admission Type (Psych Patients Only)  Admission Status Involuntary  Psychosocial Assessment  Patient Complaints None (pt has been asleep)  Eye Contact Poor  Facial Expression Flat  Affect Blunted  Speech Soft  Interaction No initiation;Isolative  Motor Activity Slow  Appearance/Hygiene In scrubs  Behavior Characteristics Unwilling to participate;Calm  Mood Preoccupied  Thought Process  Coherency Unable to assess  Content UTA  Delusions None reported or observed  Perception UTA  Hallucination None reported or observed  Judgment Poor  Confusion None  Danger to Self  Current suicidal ideation? Denies  Danger to Others  Danger to Others None reported or observed

## 2023-10-18 DIAGNOSIS — F15959 Other stimulant use, unspecified with stimulant-induced psychotic disorder, unspecified: Secondary | ICD-10-CM | POA: Diagnosis not present

## 2023-10-18 LAB — BASIC METABOLIC PANEL
Anion gap: 9 (ref 5–15)
BUN: 14 mg/dL (ref 6–20)
CO2: 23 mmol/L (ref 22–32)
Calcium: 9 mg/dL (ref 8.9–10.3)
Chloride: 106 mmol/L (ref 98–111)
Creatinine, Ser: 1.5 mg/dL — ABNORMAL HIGH (ref 0.61–1.24)
GFR, Estimated: >60 mL/min (ref >60)
Glucose, Bld: 107 mg/dL — ABNORMAL HIGH (ref 70–99)
Potassium: 4 mmol/L (ref 3.5–5.1)
Sodium: 138 mmol/L (ref 135–145)

## 2023-10-18 LAB — TSH: TSH: 1.429 u[IU]/mL (ref 0.350–4.500)

## 2023-10-18 LAB — VALPROIC ACID LEVEL: Valproic Acid Lvl: <10 ug/mL — ABNORMAL LOW (ref 50.0–100.0)

## 2023-10-18 MED ORDER — OLANZAPINE 10 MG PO TABS
10.0000 mg | ORAL_TABLET | Freq: Every day | ORAL | Status: DC
  Administered 2023-10-18 – 2023-10-21 (×4): 10 mg via ORAL
  Filled 2023-10-18 (×5): qty 1

## 2023-10-18 MED ORDER — OLANZAPINE 5 MG PO TABS
5.0000 mg | ORAL_TABLET | Freq: Every day | ORAL | Status: DC
  Administered 2023-10-19 – 2023-10-22 (×4): 5 mg via ORAL
  Filled 2023-10-18 (×5): qty 1

## 2023-10-18 NOTE — Plan of Care (Signed)
  Problem: Activity: Goal: Sleeping patterns will improve Outcome: Progressing   

## 2023-10-18 NOTE — Progress Notes (Signed)
   10/18/23 1000  Psych Admission Type (Psych Patients Only)  Admission Status Involuntary  Psychosocial Assessment  Patient Complaints None  Eye Contact Poor  Facial Expression Flat  Affect Blunted  Speech Logical/coherent  Interaction Assertive  Motor Activity Slow  Appearance/Hygiene In scrubs  Behavior Characteristics Cooperative;Calm  Mood Preoccupied  Thought Process  Coherency WDL  Content WDL  Delusions None reported or observed  Perception WDL  Hallucination None reported or observed  Judgment Poor  Confusion None  Danger to Self  Current suicidal ideation? Denies  Danger to Others  Danger to Others None reported or observed

## 2023-10-18 NOTE — [facility] Group Notes (Signed)
[facility] Group Notes:  (Nursing/MHT/Case Management/Adjunct)  Date:  10/18/2023  Time:  9:14 PM  Type of Therapy:  Psychoeducational Skills  Participation Level:  Did Not Attend  Participation Quality:  Resistant  Affect:  Resistant  Cognitive:  Lacking  Insight:  None  Engagement in Group:  None  Modes of Intervention:  Education  Summary of Progress/Problems: The patient did not attend group this evening.   Jeremy Frank S 10/18/2023, 9:14 PM

## 2023-10-18 NOTE — Progress Notes (Signed)
Patient resting in bed with eyes closed- appears to be sleeping- respirations even and unlabored. Pt remains safe with Q 15 min checks.

## 2023-10-18 NOTE — Progress Notes (Signed)
Patient refused labs and vital signs.

## 2023-10-18 NOTE — [facility] Counselor (Signed)
CSW 2nd attempt for assessment, patient was wrapped up in his blanket laying on the bed  stated " I don't want too!"

## 2023-10-18 NOTE — Progress Notes (Addendum)
Patient presented to nurse's station complaining of feeling irritable/ agitated- reported that he was tired of staying in his room, although refused to go to group- pt started ramping up about his mother, making statements that he wishes she was dead. Pt given po agitation protocol per MD order.

## 2023-10-18 NOTE — [facility] Group Notes (Signed)
Pt did not attend goals group. 

## 2023-10-18 NOTE — Group Note (Signed)
[facility] LCSW Group Therapy Note  10/18/2023  10:00-11:00AM  Type of Therapy and Topic:  Group Therapy:  Building Supports  Participation Level:  Did Not Attend   Description of Group:  Patients in this group were introduced to the idea of adding a variety of healthy supports to address the various needs in their lives.  Different types of support were defined and described, and each type was acted out.  Patients discussed what additional healthy supports could be helpful in their recovery and wellness after discharge in order to prevent future hospitalizations.   An emphasis was placed on following up with the discharge plan when they leave the hospital in order to continue becoming healthier and happier.  Two songs were played during group to help further patients' understanding.  Therapeutic Goals: 1)  demonstrate the importance of adding supports  2)  discuss 4 definitions of support  3)  identify the patient's current level of healthy support and   4)  elicit commitments to add one healthy support   Summary of Patient Progress:  Patient was invited to group, did not attend.   Therapeutic Modalities:   Psychoeducation Brief Solution-Focused Therapy  Lynnell Chad, LCSW 10/18/2023 11:30 AM

## 2023-10-18 NOTE — Progress Notes (Signed)
[facility] MD Progress Note  10/18/2023 11:53 AM Jeremy Frank  MRN:  [id number]  Subjective:   Patient is a 37 year old male that per ED records has a history of schizophrenia, who was IVC by his mother due to psychosis and aggressive behaviors at home. Patient was medically cleared by outside hospital and transferred to this behavioral health hospital.   Yesterday the psychiatry team made the following recommendations: Continue Zyprexa 5 mg twice daily  On assessment today, the patient was able to provide some more information in regards to his current symptoms and psychiatric history.  He reports that he was admitted to the psychiatric hospital "for no reason.  My mom, she is a Research officer, political party.  She wants me out of her house.  She is not even my mother.  The mother would not do that to their son" she is not even my mother.  Least I do not think she is on mother.  She could be my roommate but I do not think she is my mother".  He denies having any SI or HI leading up to admission or aggressive behaviors at home.  He denies any SI or HI at this time.  He denies having any psychotic symptoms including hallucinations, paranoia, thought control, or thought insertion, or ideas of reference leading up to this admission at this time.  He denies that his mood is down depressed or sad.  Reports that sleep has been okay.  Denies any changes in appetite or concentration.  In regards to traumatic history the patient states "I cannot disclose that". He reports he was taking Zyprexa leading up to this admission and is agreeable to continue this medication with dose adjustment.  In regards to psychiatric history, the patient states that he cannot disclose previous psychiatric diagnosis.  He reports he has been to a psychiatric hospital in the past but he cannot disclose how many times, when, where, or for what reason.  He reports that he was taking Zyprexa prior to admission here but does not know the dose.  In regards to past  psychiatric medication history the patient states I cannot disclose that.  Past medical history: Patient denies any acute or chronic medical condition.  Denies any seizure or surgical history.  NKDA.  Substance use history: Patient states I do not know, in regards to alcohol, nicotine, or other illicit drug use  Family history: Patient states I cannot disclose this, in regards to questioning if his family has any psychiatric history or history of suicide attempts.  Social history: Patient reports that he lives with his "so-called mother".  Reports he is single has no children.  Reports he is [facility] and that [place].    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(HCC) Diagnosis: Principal Problem:   Methamphetamine-induced psychotic disorder (HCC)  Total Time spent with patient: 20 minutes    Past Medical History:  Past Medical History:  Diagnosis Date   Drug dependence (HCC)    GI bleed    Hiatal hernia    Hypertension    Migraine    PTSD (post-traumatic stress disorder)     Past Surgical History:  Procedure Laterality Date   KNEE SURGERY     Family History:  Family History  Problem Relation Age of Onset   Heart failure Mother    Hypertension Father     Social History:  Social History   Substance and Sexual Activity  Alcohol Use Yes   Alcohol/week: 42.0 standard drinks of  alcohol   Types: 42 Cans of beer per week   Comment: per pt he drinks 6-12 cans of beer daily     Social History   Substance and Sexual Activity  Drug Use Yes   Types: Cocaine, Marijuana, Amphetamines    Social History   Socioeconomic History   Marital status: Single    Spouse name: Not on file   Number of children: Not on file   Years of education: Not on file   Highest education level: Not on file  Occupational History   Not on file  Tobacco Use   Smoking status: Every Day    Current packs/day: 0.25    Types: Cigarettes   Smokeless  tobacco: Never  Vaping Use   Vaping status: Never Used  Substance and Sexual Activity   Alcohol use: Yes    Alcohol/week: 42.0 standard drinks of alcohol    Types: 42 Cans of beer per week    Comment: per pt he drinks 6-12 cans of beer daily   Drug use: Yes    Types: Cocaine, Marijuana, Amphetamines   Sexual activity: Not Currently  Other Topics Concern   Not on file  Social History Narrative   Not on file   Social Determinants of Health   Financial Resource Strain: Not on file  Food Insecurity: No Food Insecurity (10/17/2023)   Hunger Vital Sign    Worried About Running Out of Food in the Last Year: Never true    Ran Out of Food in the Last Year: Never true  Transportation Needs: No Transportation Needs (10/17/2023)   PRAPARE - Administrator, Civil Service (Medical): No    Lack of Transportation (Non-Medical): No  Physical Activity: Not on file  Stress: Not on file  Social Connections: Not on file   Additional Social History:                           Current Medications: Current Facility-Administered Medications  Medication Dose Route Frequency Provider Last Rate Last Admin   acetaminophen (TYLENOL) tablet 650 mg  650 mg Oral Q6H PRN Charm Rings, NP       alum & mag hydroxide-simeth (MAALOX/MYLANTA) 200-200-20 MG/5ML suspension 30 mL  30 mL Oral Q4H PRN Charm Rings, NP   30 mL at 10/17/23 1252   diphenhydrAMINE (BENADRYL) capsule 50 mg  50 mg Oral Q8H PRN Charm Rings, NP   50 mg at 10/17/23 1225   Or   diphenhydrAMINE (BENADRYL) injection 50 mg  50 mg Intramuscular Q8H PRN Charm Rings, NP       folic acid (FOLVITE) tablet 1 mg  1 mg Oral Daily Charm Rings, NP   1 mg at 10/18/23 8295   gabapentin (NEURONTIN) capsule 300 mg  300 mg Oral TID Charm Rings, NP   300 mg at 10/18/23 6213   haloperidol (HALDOL) tablet 5 mg  5 mg Oral Q8H PRN Charm Rings, NP   5 mg at 10/17/23 1225   Or   haloperidol lactate (HALDOL) injection  5 mg  5 mg Intramuscular Q8H PRN Charm Rings, NP       hydrochlorothiazide (HYDRODIURIL) tablet 12.5 mg  12.5 mg Oral Daily Charm Rings, NP   12.5 mg at 10/18/23 0865   hydrOXYzine (ATARAX) tablet 50 mg  50 mg Oral TID PRN Phineas Inches, MD       LORazepam (ATIVAN) tablet 2  mg  2 mg Oral Q8H PRN Charm Rings, NP   2 mg at 10/17/23 1225   Or   LORazepam (ATIVAN) injection 2 mg  2 mg Intramuscular Q8H PRN Charm Rings, NP       multivitamin with minerals tablet 1 tablet  1 tablet Oral Daily Charm Rings, NP   1 tablet at 10/18/23 1610   nicotine (NICODERM CQ - dosed in mg/24 hours) patch 14 mg  14 mg Transdermal Daily Charm Rings, NP   14 mg at 10/18/23 0735   OLANZapine (ZYPREXA) tablet 10 mg  10 mg Oral QHS Devonne Lalani, Harrold Donath, MD       Melene Muller ON 10/19/2023] OLANZapine (ZYPREXA) tablet 5 mg  5 mg Oral Daily Caster Fayette, MD       pantoprazole (PROTONIX) EC tablet 40 mg  40 mg Oral Daily Ludmila Ebarb, Harrold Donath, MD   40 mg at 10/18/23 9604   thiamine (Vitamin B-1) tablet 100 mg  100 mg Oral Daily Charm Rings, NP   100 mg at 10/18/23 5409   Or   thiamine (VITAMIN B1) injection 100 mg  100 mg Intravenous Daily Charm Rings, NP       traZODone (DESYREL) tablet 50 mg  50 mg Oral QHS PRN Phineas Inches, MD   50 mg at 10/17/23 2057    Lab Results: No results found for this or any previous visit (from the past 48 hour(s)).  Blood Alcohol level:  Lab Results  Component Value Date   ETH <10 10/14/2023   ETH <10 10/07/2023    Metabolic Disorder Labs: Lab Results  Component Value Date   HGBA1C 5.1 02/11/2022   MPG 99.67 02/11/2022   MPG 119.76 06/19/2021   No results found for: "PROLACTIN" Lab Results  Component Value Date   CHOL 165 02/11/2022   TRIG 81 02/11/2022   HDL 47 02/11/2022   CHOLHDL 3.5 02/11/2022   VLDL 16 02/11/2022   LDLCALC 102 (H) 02/11/2022   LDLCALC 72 06/19/2021    Physical Findings: AIMS:  , ,  ,  ,    CIWA:  CIWA-Ar Total:  1 COWS:     Musculoskeletal: Strength & Muscle Tone: Lying in bed Gait & Station: Lying in bed Patient leans: Lying in bed  Psychiatric Specialty Exam:  Presentation  General Appearance:  -- (laying in bed)  Eye Contact: Poor  Speech: Slow  Speech Volume: Decreased  Handedness: Right   Mood and Affect  Mood: Anxious  Affect: Restricted   Thought Process  Thought Processes: Linear  Descriptions of Associations:Intact  Orientation:Full (Time, Place and Person)  Thought Content:-- (denies ah, vh, paranoia, other sx of psychosis)  History of Schizophrenia/Schizoaffective disorder:No data recorded Duration of Psychotic Symptoms:Greater than six months  Hallucinations:Hallucinations: None  Ideas of Reference:None  Suicidal Thoughts:Suicidal Thoughts: -- (laying in bed, sleeping off and on, not participating in the interview)  Homicidal Thoughts:Homicidal Thoughts: No   Sensorium  Memory: Immediate Fair; Recent Poor; Remote Fair  Judgment: Impaired  Insight: Lacking   Executive Functions  Concentration: Poor  Attention Span: Poor  Recall: Poor  Fund of Knowledge: Fair  Language: Fair   Psychomotor Activity  Psychomotor Activity: Psychomotor Activity: Normal   Assets  Assets: Financial Resources/Insurance; Resilience   Sleep  Sleep: Sleep: Fair    Physical Exam: Physical Exam Constitutional:      Appearance: He is normal weight.  Pulmonary:     Effort: Pulmonary effort is normal.  Neurological:  Mental Status: He is alert.    Review of Systems  Psychiatric/Behavioral:  Positive for substance abuse. Negative for depression, hallucinations and suicidal ideas. The patient does not have insomnia.    Blood pressure 112/74, pulse 63, temperature 98.6 F (37 C), temperature source Oral, resp. rate 16, height 5\' 6"  (1.676 m), weight 114.1 kg, SpO2 100%. Body mass index is 40.61 kg/m.   Treatment Plan  Summary: Daily contact with patient to assess and evaluate symptoms and progress in treatment and Medication management   ASSESSMENT:   Diagnoses / Active Problems: History of schizophrenia. 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 Psychiatric Diagnoses and Treatment:               -Increase zyprexa from 5 mg bid -> to 5 mg qam and 10 mg at bedtime - for psychosis  -Continue home gabapentin 300 mg tid    -Continue to Neue atarax PRN and trazodone PRN   -Continue ativan PRN for any alcohol w/d    -Continue unit restrictions due to threat of violence towards roommate (roommate moved to a different hall).    --  The risks/benefits/side-effects/alternatives to this medication were discussed in detail with the patient and time was given for questions. The patient consents to medication trial.                -- Metabolic profile and EKG monitoring obtained while on an atypical antipsychotic (BMI: Lipid Panel: HbgA1c: QTc:)              -- Encouraged patient to participate in unit milieu and in scheduled group therapies              -- Short Term Goals: Ability to identify changes in lifestyle to reduce recurrence of condition will improve, Ability to verbalize feelings will improve, Ability to disclose and discuss suicidal ideas, Ability to demonstrate self-control will improve, Ability to identify and develop effective coping behaviors will improve, Ability to maintain clinical measurements within  normal limits will improve, Compliance with prescribed medications will improve, and Ability to identify triggers associated with substance abuse/mental health issues will improve             -- Long Term Goals: Improvement in symptoms so as ready for discharge                3. Medical Issues Being Addressed:          4. Discharge Planning:              -- Social work and case management to assist with discharge planning and identification of hospital follow-up needs prior to discharge             --  Estimated LOS: 5-7 days             -- Discharge Concerns: Need to establish a safety plan; Medication compliance and effectiveness             -- Discharge Goals: Return home with outpatient referrals for mental health follow-up including medication management/psychotherapy   Phineas Inches, MD 10/18/2023, 11:53 AM  Total Time Spent in Direct Patient Care:  I personally spent 35 minutes on the unit in direct patient care. The direct patient care time included face-to-face time with the patient, reviewing the patient's chart, communicating with other professionals, and coordinating care. Greater than 50% of this time was spent in counseling or coordinating care with the patient regarding goals of hospitalization, psycho-education, and discharge planning needs.   Phineas Inches, MD Psychiatrist

## 2023-10-18 NOTE — Plan of Care (Signed)
  Problem: Activity: Goal: Interest or engagement in activities will improve Outcome: Not Progressing   Problem: Safety: Goal: Periods of time without injury will increase Outcome: Progressing

## 2023-10-18 NOTE — [facility] Group Notes (Signed)
[facility] Group Notes:  (Nursing/MHT/Case Management/Adjunct)  Date:  10/18/2023  Time:  3:18 PM  Type of Therapy:  Psychoeducational Skills  Participation Level:  Did Not Attend  Participation Quality:  na  Affect:  na  Cognitive:  na  Insight:  None  Engagement in Group:  na  Modes of Intervention:  na  Summary of Progress/Problems: pt did not attend RN group  Malva Limes 10/18/2023, 3:18 PM

## 2023-10-19 ENCOUNTER — Encounter (HOSPITAL_COMMUNITY): Payer: Self-pay

## 2023-10-19 NOTE — Progress Notes (Signed)
[facility] MD Progress Note  10/19/2023 11:04 AM Jeremy Frank  MRN:  [id number]  Subjective:   Patient is a 37 year old male that per ED records has a history of schizophrenia, who was IVC by his mother due to psychosis and aggressive behaviors at home. Patient was medically cleared by outside hospital and transferred to this behavioral health hospital.   Yesterday the psychiatry team made the following recommendations: Increase Zyprexa 5 mg Qam and 10 mg PO QHS  Patient seen in his room this morning on my approach. He reports that he is currently in the hospital due to issues with his family that he did not want to disclose. The patient is focused on discharge and getting back to his car that he mentions has $6,000 worth of property. The patient was asked about his substance abuse and he stated that he was not going to stop using drugs and he had no interest in going to rehab.   The patient is frustrated that he is in the hospital and he does not want to participate with any of the programming.   Past medical history: Patient denies any acute or chronic medical condition.  Denies any seizure or surgical history.  NKDA.  Substance use history: Patient states I do not know, in regards to alcohol, nicotine, or other illicit drug use  Family history: Patient states I cannot disclose this, in regards to questioning if his family has any psychiatric history or history of suicide attempts.  Social history: Patient reports that he lives with his "so-called mother".  Reports he is single has no children.  Reports he is [facility] connected and that his [place].    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(HCC) Diagnosis: Principal Problem:   Methamphetamine-induced psychotic disorder (HCC)  Total Time spent with patient: 20 minutes    Past Medical History:  Past Medical History:  Diagnosis Date   Drug dependence (HCC)    GI bleed    Hiatal hernia    Hypertension     Migraine    PTSD (post-traumatic stress disorder)     Past Surgical History:  Procedure Laterality Date   KNEE SURGERY     Family History:  Family History  Problem Relation Age of Onset   Heart failure Mother    Hypertension Father     Social History:  Social History   Substance and Sexual Activity  Alcohol Use Yes   Alcohol/week: 42.0 standard drinks of alcohol   Types: 42 Cans of beer per week   Comment: per pt he drinks 6-12 cans of beer daily     Social History   Substance and Sexual Activity  Drug Use Yes   Types: Cocaine, Marijuana, Amphetamines    Social History   Socioeconomic History   Marital status: Single    Spouse name: Not on file   Number of children: Not on file   Years of education: Not on file   Highest education level: Not on file  Occupational History   Not on file  Tobacco Use   Smoking status: Every Day    Current packs/day: 0.25    Types: Cigarettes   Smokeless tobacco: Never  Vaping Use   Vaping status: Never Used  Substance and Sexual Activity   Alcohol use: Yes    Alcohol/week: 42.0 standard drinks of alcohol    Types: 42 Cans of beer per week    Comment: per pt he drinks 6-12 cans of beer daily   Drug  use: Yes    Types: Cocaine, Marijuana, Amphetamines   Sexual activity: Not Currently  Other Topics Concern   Not on file  Social History Narrative   Not on file   Social Determinants of Health   Financial Resource Strain: Not on file  Food Insecurity: No Food Insecurity (10/17/2023)   Hunger Vital Sign    Worried About Running Out of Food in the Last Year: Never true    Ran Out of Food in the Last Year: Never true  Transportation Needs: No Transportation Needs (10/17/2023)   PRAPARE - Administrator, Civil Service (Medical): No    Lack of Transportation (Non-Medical): No  Physical Activity: Not on file  Stress: Not on file  Social Connections: Not on file   Additional Social History:                            Current Medications: Current Facility-Administered Medications  Medication Dose Route Frequency Provider Last Rate Last Admin   acetaminophen (TYLENOL) tablet 650 mg  650 mg Oral Q6H PRN Charm Rings, NP       alum & mag hydroxide-simeth (MAALOX/MYLANTA) 200-200-20 MG/5ML suspension 30 mL  30 mL Oral Q4H PRN Charm Rings, NP   30 mL at 10/17/23 1252   diphenhydrAMINE (BENADRYL) capsule 50 mg  50 mg Oral Q8H PRN Charm Rings, NP   50 mg at 10/18/23 1354   Or   diphenhydrAMINE (BENADRYL) injection 50 mg  50 mg Intramuscular Q8H PRN Charm Rings, NP       folic acid (FOLVITE) tablet 1 mg  1 mg Oral Daily Charm Rings, NP   1 mg at 10/18/23 7253   gabapentin (NEURONTIN) capsule 300 mg  300 mg Oral TID Charm Rings, NP   300 mg at 10/19/23 0801   haloperidol (HALDOL) tablet 5 mg  5 mg Oral Q8H PRN Charm Rings, NP   5 mg at 10/18/23 1354   Or   haloperidol lactate (HALDOL) injection 5 mg  5 mg Intramuscular Q8H PRN Charm Rings, NP       hydrochlorothiazide (HYDRODIURIL) tablet 12.5 mg  12.5 mg Oral Daily Charm Rings, NP   12.5 mg at 10/18/23 6644   hydrOXYzine (ATARAX) tablet 50 mg  50 mg Oral TID PRN Massengill, Harrold Donath, MD       LORazepam (ATIVAN) tablet 2 mg  2 mg Oral Q8H PRN Charm Rings, NP   2 mg at 10/18/23 1354   Or   LORazepam (ATIVAN) injection 2 mg  2 mg Intramuscular Q8H PRN Charm Rings, NP       multivitamin with minerals tablet 1 tablet  1 tablet Oral Daily Charm Rings, NP   1 tablet at 10/18/23 0347   nicotine (NICODERM CQ - dosed in mg/24 hours) patch 14 mg  14 mg Transdermal Daily Charm Rings, NP   14 mg at 10/19/23 0802   OLANZapine (ZYPREXA) tablet 10 mg  10 mg Oral QHS Massengill, Harrold Donath, MD   10 mg at 10/18/23 2042   OLANZapine (ZYPREXA) tablet 5 mg  5 mg Oral Daily Massengill, Nathan, MD   5 mg at 10/19/23 0800   pantoprazole (PROTONIX) EC tablet 40 mg  40 mg Oral Daily Massengill, Harrold Donath, MD   40 mg at 10/19/23 0801    thiamine (Vitamin B-1) tablet 100 mg  100 mg Oral Daily Nanine Means  Y, NP   100 mg at 10/18/23 1027   Or   thiamine (VITAMIN B1) injection 100 mg  100 mg Intravenous Daily Charm Rings, NP       traZODone (DESYREL) tablet 50 mg  50 mg Oral QHS PRN Phineas Inches, MD   50 mg at 10/17/23 2057    Lab Results:  Results for orders placed or performed during the hospital encounter of 10/17/23 (from the past 48 hour(s))  Basic metabolic panel     Status: Abnormal   Collection Time: 10/18/23  6:53 PM  Result Value Ref Range   Sodium 138 135 - 145 mmol/L   Potassium 4.0 3.5 - 5.1 mmol/L   Chloride 106 98 - 111 mmol/L   CO2 23 22 - 32 mmol/L   Glucose, Bld 107 (H) 70 - 99 mg/dL    Comment: Glucose reference range applies only to samples taken after fasting for at least 8 hours.   BUN 14 6 - 20 mg/dL   Creatinine, Ser 2.53 (H) 0.61 - 1.24 mg/dL   Calcium 9.0 8.9 - 66.4 mg/dL   GFR, Estimated >40 >34 mL/min    Comment: (NOTE) Calculated using the CKD-EPI Creatinine Equation (2021)    Anion gap 9 5 - 15    Comment: Performed at [facility], [address] [place], [place] [address]  Valproic acid level     Status: Abnormal   Collection Time: 10/18/23  6:53 PM  Result Value Ref Range   Valproic Acid Lvl <10 (L) 50.0 - 100.0 ug/mL    Comment: RESULT CONFIRMED BY MANUAL DILUTION Performed at [facility], [address], [place], [place] [address]   TSH     Status: None   Collection Time: 10/18/23  6:53 PM  Result Value Ref Range   TSH 1.429 0.350 - 4.500 uIU/mL    Comment: Performed by a 3rd Generation assay with a functional sensitivity of <=0.01 uIU/mL. Performed at [facility], [address], [place], [place] [address]     Blood Alcohol level:  Lab Results  Component Value Date   ETH <10 10/14/2023   ETH <10 10/07/2023    Metabolic Disorder Labs: Lab Results  Component Value Date   HGBA1C 5.1 02/11/2022    MPG 99.67 02/11/2022   MPG 119.76 06/19/2021   No results found for: "PROLACTIN" Lab Results  Component Value Date   CHOL 165 02/11/2022   TRIG 81 02/11/2022   HDL 47 02/11/2022   CHOLHDL 3.5 02/11/2022   VLDL 16 02/11/2022   LDLCALC 102 (H) 02/11/2022   LDLCALC 72 06/19/2021    Physical Findings: AIMS:  , ,  ,  ,    CIWA:  CIWA-Ar Total: 0 COWS:     Musculoskeletal: Strength & Muscle Tone: Lying in bed Gait & Station: Lying in bed Patient leans: Lying in bed  Psychiatric Specialty Exam:  Presentation  General Appearance:  Well Groomed  Eye Contact: Minimal  Speech: Normal Rate  Speech Volume: Normal  Handedness: Right   Mood and Affect  Mood: Angry  Affect: -- (Irritable)   Thought Process  Thought Processes: Linear  Descriptions of Associations:Intact  Orientation:Full (Time, Place and Person)  Thought Content:Logical  History of Schizophrenia/Schizoaffective disorder:No data recorded Duration of Psychotic Symptoms:Greater than six months  Hallucinations:Hallucinations: None  Ideas of Reference:None  Suicidal Thoughts:Suicidal Thoughts: No  Homicidal Thoughts:Homicidal Thoughts: No   Sensorium  Memory: Immediate Good; Remote Good  Judgment: Poor  Insight: Poor   Executive Functions  Concentration: Good  Attention Span: Good  Recall: Dudley Major of Knowledge: Fair  Language: Good   Psychomotor Activity  Psychomotor Activity: Psychomotor Activity: Normal   Assets  Assets: Financial Resources/Insurance; Resilience   Sleep  Sleep: Sleep: Fair    Physical Exam: Physical Exam Constitutional:      Appearance: He is normal weight.  Pulmonary:     Effort: Pulmonary effort is normal.  Neurological:     Mental Status: He is alert.    Review of Systems  Psychiatric/Behavioral:  Positive for substance abuse. Negative for depression, hallucinations and suicidal ideas. The patient does not have  insomnia.    Blood pressure 128/85, pulse 72, temperature 98.8 F (37.1 C), temperature source Oral, resp. rate 16, height 5\' 6"  (1.676 m), weight 114.1 kg, SpO2 99%. Body mass index is 40.61 kg/m.   Treatment Plan Summary: Daily contact with patient to assess and evaluate symptoms and progress in treatment and Medication management   ASSESSMENT:   Diagnoses / Active Problems: History of schizophrenia. 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 Psychiatric Diagnoses and Treatment:               -Increase zyprexa from 5 mg bid -> to 5 mg qam and 10 mg at bedtime - for psychosis  -Continue home gabapentin 300 mg tid    -Continue to Neue atarax PRN and trazodone PRN   -Continue ativan PRN for any alcohol w/d    -Continue unit restrictions due to threat of violence towards roommate (roommate moved to a different hall).    --  The risks/benefits/side-effects/alternatives to this medication were discussed in detail with the patient and time was given for questions. The patient consents to medication trial.                -- Metabolic profile and EKG monitoring obtained while on an atypical antipsychotic (BMI: Lipid Panel: HbgA1c: QTc:)              -- Encouraged patient to participate in unit milieu and in scheduled group therapies              -- Short Term Goals: Ability to identify changes in lifestyle to reduce recurrence of condition will improve, Ability to verbalize feelings will improve,  Ability to disclose and discuss suicidal ideas, Ability to demonstrate self-control will improve, Ability to identify and develop effective coping behaviors will improve, Ability to maintain clinical measurements within normal limits will improve, Compliance with prescribed medications will improve, and Ability to identify triggers associated with substance abuse/mental health issues will improve             --  Long Term Goals: Improvement in symptoms so as ready for discharge                3. Medical Issues Being Addressed:          4. Discharge Planning:              -- Social work and case management to assist with discharge planning and identification of hospital follow-up needs prior to discharge             -- Estimated LOS: 5-7 days             -- Discharge Concerns: Need to establish a safety plan; Medication compliance and effectiveness             -- Discharge Goals: Return home with outpatient referrals for mental health follow-up including medication management/psychotherapy   Harlin Heys, DO 10/19/2023, 11:04 AM  Total Time Spent in Direct Patient Care:  I personally spent 35 minutes on the unit in direct patient care. The direct patient care time included face-to-face time with the patient, reviewing the patient's chart, communicating with other professionals, and coordinating care. Greater than 50% of this time was spent in counseling or coordinating care with the patient regarding goals of hospitalization, psycho-education, and discharge planning needs.   Phineas Inches, MD Psychiatrist

## 2023-10-19 NOTE — [facility] Group Notes (Signed)

## 2023-10-19 NOTE — BH IP Treatment Plan (Signed)
Interdisciplinary Treatment and Diagnostic Plan Update  10/19/2023 Time of Session: 10:45AM Jeremy Frank MRN: [id number]  Principal Diagnosis: Methamphetamine-induced psychotic disorder [facility])  Secondary Diagnoses: Principal Problem:   Methamphetamine-induced psychotic disorder (HCC)   Current Medications:  Current Facility-Administered Medications  Medication Dose Route Frequency Provider Last Rate Last Admin   acetaminophen (TYLENOL) tablet 650 mg  650 mg Oral Q6H PRN Charm Rings, NP       alum & mag hydroxide-simeth (MAALOX/MYLANTA) 200-200-20 MG/5ML suspension 30 mL  30 mL Oral Q4H PRN Charm Rings, NP   30 mL at 10/17/23 1252   diphenhydrAMINE (BENADRYL) capsule 50 mg  50 mg Oral Q8H PRN Charm Rings, NP   50 mg at 10/18/23 1354   Or   diphenhydrAMINE (BENADRYL) injection 50 mg  50 mg Intramuscular Q8H PRN Charm Rings, NP       folic acid (FOLVITE) tablet 1 mg  1 mg Oral Daily Charm Rings, NP   1 mg at 10/18/23 3295   gabapentin (NEURONTIN) capsule 300 mg  300 mg Oral TID Charm Rings, NP   300 mg at 10/19/23 1110   haloperidol (HALDOL) tablet 5 mg  5 mg Oral Q8H PRN Charm Rings, NP   5 mg at 10/18/23 1354   Or   haloperidol lactate (HALDOL) injection 5 mg  5 mg Intramuscular Q8H PRN Charm Rings, NP       hydrochlorothiazide (HYDRODIURIL) tablet 12.5 mg  12.5 mg Oral Daily Charm Rings, NP   12.5 mg at 10/18/23 1884   hydrOXYzine (ATARAX) tablet 50 mg  50 mg Oral TID PRN Phineas Inches, MD   50 mg at 10/19/23 1110   LORazepam (ATIVAN) tablet 2 mg  2 mg Oral Q8H PRN Charm Rings, NP   2 mg at 10/18/23 1354   Or   LORazepam (ATIVAN) injection 2 mg  2 mg Intramuscular Q8H PRN Charm Rings, NP       multivitamin with minerals tablet 1 tablet  1 tablet Oral Daily Charm Rings, NP   1 tablet at 10/18/23 1660   nicotine (NICODERM CQ - dosed in mg/24 hours) patch 14 mg  14 mg Transdermal Daily Charm Rings, NP   14 mg at 10/19/23 0802    OLANZapine (ZYPREXA) tablet 10 mg  10 mg Oral QHS Massengill, Harrold Donath, MD   10 mg at 10/18/23 2042   OLANZapine (ZYPREXA) tablet 5 mg  5 mg Oral Daily Massengill, Harrold Donath, MD   5 mg at 10/19/23 0800   pantoprazole (PROTONIX) EC tablet 40 mg  40 mg Oral Daily Massengill, Harrold Donath, MD   40 mg at 10/19/23 0801   thiamine (Vitamin B-1) tablet 100 mg  100 mg Oral Daily Charm Rings, NP   100 mg at 10/18/23 6301   Or   thiamine (VITAMIN B1) injection 100 mg  100 mg Intravenous Daily Charm Rings, NP       traZODone (DESYREL) tablet 50 mg  50 mg Oral QHS PRN Phineas Inches, MD   50 mg at 10/17/23 2057   PTA Medications: Medications Prior to Admission  Medication Sig Dispense Refill Last Dose   acetaminophen (TYLENOL) 500 MG tablet Take 500 mg by mouth every 6 (six) hours as needed for mild pain, moderate pain, fever or headache. (Patient not taking: Reported on 10/08/2023)      divalproex (DEPAKOTE) 500 MG DR tablet Take 1 tablet (500 mg total) by mouth every  12 (twelve) hours. (Patient not taking: Reported on 03/18/2023) 60 tablet 1    FLUoxetine (PROZAC) 20 MG capsule Take 1 tablet by mouth daily. (Patient not taking: Reported on 10/08/2023)      gabapentin (NEURONTIN) 300 MG capsule Take 1 capsule (300 mg total) by mouth 3 (three) times daily. (Patient not taking: Reported on 03/18/2023) 90 capsule 1    hydrochlorothiazide (HYDRODIURIL) 12.5 MG tablet Take 1 tablet (12.5 mg total) by mouth daily. 30 tablet 0    hydrochlorothiazide (HYDRODIURIL) 12.5 MG tablet Take 1 tablet (12.5 mg total) by mouth daily for 2 days. 2 tablet 0    hydrOXYzine (ATARAX) 50 MG tablet Take 1 tablet (50 mg total) by mouth every 4 (four) hours as needed for anxiety. (Patient not taking: Reported on 09/27/2022) 30 tablet 1    ibuprofen (ADVIL) 200 MG tablet Take 200 mg by mouth every 6 (six) hours as needed for fever, headache, mild pain or moderate pain. (Patient not taking: Reported on 10/08/2023)      miconazole  (MICOTIN) 2 % powder Apply topically. APPLY MODERATE AMOUNT TOPICALLY TWO TIMES A DAY FOR ATHLETE'S FOOT (Patient not taking: Reported on 10/08/2023)      naloxone (NARCAN) nasal spray 4 mg/0.1 mL Place 1 spray into the nose once. SPRAY 1 SPRAY INTO NOSE AS NEEDED FOR OPIOID OVERDOSE **FOR EMERGENCY USE ONLY!** IN CASE OF EMERGENCY      nicotine (NICODERM CQ - DOSED IN MG/24 HOURS) 14 mg/24hr patch Place 14 mg onto the skin daily.      OLANZapine (ZYPREXA) 5 MG tablet Take 1 tablet (5 mg total) by mouth at bedtime. (Patient not taking: Reported on 09/27/2022) 30 tablet WARM    oxyCODONE (OXY IR/ROXICODONE) 5 MG immediate release tablet Take 1 tablet by mouth every 8 (eight) hours as needed. (Patient not taking: Reported on 10/08/2023)      pantoprazole (PROTONIX) 40 MG tablet Take 2 tablets (80 mg total) by mouth daily. (Patient not taking: Reported on 09/27/2022) 60 tablet 1    traZODone (DESYREL) 100 MG tablet Take 1 tablet (100 mg total) by mouth at bedtime as needed for sleep. (Patient not taking: Reported on 04/01/2022) 30 tablet 1     Patient Stressors: Marital or family conflict   Medication change or noncompliance   Substance abuse   Traumatic event    Patient Strengths: Capable of independent living  Supportive family/friends  Work skills   Treatment Modalities: Medication Management, Group therapy, Case management,  1 to 1 session with clinician, Psychoeducation, Recreational therapy.   Physician Treatment Plan for Primary Diagnosis: Methamphetamine-induced psychotic disorder (HCC) Long Term Goal(s): Improvement in symptoms so as ready for discharge   Short Term Goals: Ability to identify changes in lifestyle to reduce recurrence of condition will improve Ability to verbalize feelings will improve Ability to disclose and discuss suicidal ideas Ability to demonstrate self-control will improve Ability to identify and develop effective coping behaviors will improve Ability to  maintain clinical measurements within normal limits will improve Compliance with prescribed medications will improve Ability to identify triggers associated with substance abuse/mental health issues will improve  Medication Management: Evaluate patient's response, side effects, and tolerance of medication regimen.  Therapeutic Interventions: 1 to 1 sessions, Unit Group sessions and Medication administration.  Evaluation of Outcomes: Not Progressing  Physician Treatment Plan for Secondary Diagnosis: Principal Problem:   Methamphetamine-induced psychotic disorder (HCC)  Long Term Goal(s): Improvement in symptoms so as ready for discharge   Short Term Goals:  Ability to identify changes in lifestyle to reduce recurrence of condition will improve Ability to verbalize feelings will improve Ability to disclose and discuss suicidal ideas Ability to demonstrate self-control will improve Ability to identify and develop effective coping behaviors will improve Ability to maintain clinical measurements within normal limits will improve Compliance with prescribed medications will improve Ability to identify triggers associated with substance abuse/mental health issues will improve     Medication Management: Evaluate patient's response, side effects, and tolerance of medication regimen.  Therapeutic Interventions: 1 to 1 sessions, Unit Group sessions and Medication administration.  Evaluation of Outcomes: Not Progressing   RN Treatment Plan for Primary Diagnosis: Methamphetamine-induced psychotic disorder (HCC) Long Term Goal(s): Knowledge of disease and therapeutic regimen to maintain health will improve  Short Term Goals: Ability to remain free from injury will improve, Ability to verbalize frustration and anger appropriately will improve, Ability to demonstrate self-control, Ability to participate in decision making will improve, Ability to verbalize feelings will improve, Ability to disclose  and discuss suicidal ideas, Ability to identify and develop effective coping behaviors will improve, and Compliance with prescribed medications will improve  Medication Management: RN will administer medications as ordered by provider, will assess and evaluate patient's response and provide education to patient for prescribed medication. RN will report any adverse and/or side effects to prescribing provider.  Therapeutic Interventions: 1 on 1 counseling sessions, Psychoeducation, Medication administration, Evaluate responses to treatment, Monitor vital signs and CBGs as ordered, Perform/monitor CIWA, COWS, AIMS and Fall Risk screenings as ordered, Perform wound care treatments as ordered.  Evaluation of Outcomes: Not Progressing   LCSW Treatment Plan for Primary Diagnosis: Methamphetamine-induced psychotic disorder (HCC) Long Term Goal(s): Safe transition to appropriate next level of care at discharge, Engage patient in therapeutic group addressing interpersonal concerns.  Short Term Goals: Engage patient in aftercare planning with referrals and resources, Increase social support, Increase ability to appropriately verbalize feelings, Increase emotional regulation, Facilitate acceptance of mental health diagnosis and concerns, Facilitate patient progression through stages of change regarding substance use diagnoses and concerns, Identify triggers associated with mental health/substance abuse issues, and Increase skills for wellness and recovery  Therapeutic Interventions: Assess for all discharge needs, 1 to 1 time with Social worker, Explore available resources and support systems, Assess for adequacy in community support network, Educate family and significant other(s) on suicide prevention, Complete Psychosocial Assessment, Interpersonal group therapy.  Evaluation of Outcomes: Not Progressing   Progress in Treatment: Attending groups: No. Participating in groups: No. Taking medication as  prescribed: Yes. Toleration medication: Yes. Family/Significant other contact made: No, will contact:  Consent pending Patient understands diagnosis: Yes. Discussing patient identified problems/goals with staff: Yes. Medical problems stabilized or resolved: Yes. Denies suicidal/homicidal ideation: Yes. Issues/concerns per patient self-inventory: No.   New problem(s) identified: No, Describe:  none reported  New Short Term/Long Term Goal(s): detox, medication management for mood stabilization; elimination of SI thoughts; development of comprehensive mental wellness/sobriety plan   Patient Goals:  "Get in touch with my family"  Discharge Plan or Barriers: Patient recently admitted. CSW will continue to follow and assess for appropriate referrals and possible discharge planning.    Reason for Continuation of Hospitalization: Depression Medication stabilization Withdrawal symptoms  Estimated Length of Stay: 5-7 days  Last 3 [place] Suicide Severity Risk Score: Flowsheet Row Ad[facility] (Current[facility] 10[facility]14/2023 in [facility] ED from 10/07/2023 in [facility] Emergency Department at  [facility]  C-SSRS RISK CATEGORY No Risk No Risk High Risk       Last PHQ 2/9 Scores:     No data to display          Scribe for Treatment Team: Kathi Der, LCSWA 10/19/2023 1:40 PM

## 2023-10-19 NOTE — Progress Notes (Signed)
   10/19/23 0810  Psych Admission Type (Psych Patients Only)  Admission Status Involuntary  Psychosocial Assessment  Patient Complaints None  Eye Contact Poor  Facial Expression Flat  Affect Flat;Irritable;Angry  Speech Logical/coherent  Interaction Assertive  Motor Activity Slow  Appearance/Hygiene Disheveled  Behavior Characteristics Guarded;Irritable;Unwilling to participate  Mood Angry;Irritable  Thought Process  Coherency WDL  Content WDL  Delusions None reported or observed  Perception WDL  Hallucination None reported or observed  Judgment Poor  Confusion None  Danger to Self  Current suicidal ideation? Denies  Danger to Others  Danger to Others None reported or observed

## 2023-10-19 NOTE — Group Note (Signed)
Date:  10/19/2023 Time:  9:53 PM  Group Topic/Focus:  Wrap-Up Group:   The focus of this group is to help patients review their daily goal of treatment and discuss progress on daily workbooks.    Participation Level:  Did Not Attend  Participation Quality:   N/A  Affect:   N/A  Cognitive:   N/A  Insight: None  Engagement in Group:   N/A  Modes of Intervention:   N/A  Additional Comments:  Patient did not attend wrap up group.   Kennieth Francois 10/19/2023, 9:53 PM

## 2023-10-19 NOTE — Progress Notes (Signed)
Patient refused vital signs on shift. Physician aware. Safety maintained.

## 2023-10-19 NOTE — Group Note (Signed)
Recreation Therapy Group Note   Group Topic:Stress Management  Group Date: 10/19/2023 Start Time: 0935 End Time: 1000 Facilitators: Darry Kelnhofer-McCall, LRT,CTRS Location: [address] Dayroom   Group Topic: Stress Management   Goal Area(s) Addresses:  Patient will actively participate in stress management techniques presented during session.  Patient will successfully identify benefit of practicing stress management post d/c.   Intervention: Relaxation exercise with ambient sound and script   Group Description: Guided Imagery. LRT provided education, instruction, and demonstration on practice of visualization via guided imagery. Patient was asked to participate in the technique introduced during session. LRT debriefed including topics of mindfulness, stress management and specific scenarios each patient could use these techniques. Patients were given suggestions of ways to access scripts post d/c and encouraged to explore Youtube and other apps available on smartphones, tablets, and computers.  Education:  Stress Management, Discharge Planning.   Education Outcome: Acknowledges education   Affect/Mood: N/A   Participation Level: Did not attend    Clinical Observations/Individualized Feedback:    Plan: Continue to engage patient in RT group sessions 2-3x/week.   Jeremy Frank, LRT,CTRS 10/19/2023 1:12 PM

## 2023-10-19 NOTE — Plan of Care (Signed)
  Problem: Safety: Goal: Periods of time without injury will increase Outcome: Progressing   Problem: Coping: Goal: Ability to use eye contact when communicating with others will improve Outcome: Progressing

## 2023-10-20 DIAGNOSIS — F15959 Other stimulant use, unspecified with stimulant-induced psychotic disorder, unspecified: Secondary | ICD-10-CM | POA: Diagnosis not present

## 2023-10-20 NOTE — Plan of Care (Signed)
  Problem: Education: Goal: Knowledge of [facility] Education information/materials will improve Outcome: Progressing Goal: Emotional status will improve Outcome: Progressing Goal: Mental status will improve Outcome: Progressing   

## 2023-10-20 NOTE — Progress Notes (Signed)
[facility] MD Progress Note  10/20/2023 1:08 PM Jeremy Frank  MRN:  [id number]  Subjective:   Patient is a 37 year old male that per ED records has a history of schizophrenia, who was IVC by his mother due to psychosis and aggressive behaviors at home. Patient was medically cleared by outside hospital and transferred to this behavioral health hospital.   Staff reports no behavioral issues.  Patient apparently slept fairly well and has been compliant with medications.  Yesterday the psychiatry team made the following recommendations: -Increase zyprexa from 5 mg bid -> to 5 mg qam and 10 mg at bedtime - for psychosis  -Continue home gabapentin 300 mg tid    -Continue atarax PRN and trazodone PRN   -Continue ativan PRN for any alcohol w/d   The patient was seen in his room.  He was alert oriented and cooperative.  He slept fairly well.  He rates his depression at 8/10.  He admits to some depression and worry about his current situation because he has legal issues pending that includes a court date 2 days from now for driving without a license and also shoplifting.  He is currently homeless.  He was staying with family and friends but he knows that he cannot go back there. He continues to endorse an interest in stopping all drugs but reports that he has no interest in going to rehab.  Today is contracting for safety and denies any psychosis and reports that the medication has been effective.  He acknowledges methamphetamine and alcohol abuse prior to admission.  Today is contracting for safety and he is unsure where he will go upon discharge.  He may be service-connected.    Past medical history: Patient denies any acute or chronic medical condition.  Denies any seizure or surgical history.  NKDA.  Substance use history: Patient states I do not know, in regards to alcohol, nicotine, or other illicit drug use  Family history: Patient states I cannot disclose this, in regards to questioning if his  family has any psychiatric history or history of suicide attempts.  Social history: Patient reports that he lives with his "so-called mother".  Reports he is single has no children.  Reports he is [facility] and that his [place] is [place].    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(HCC) Diagnosis: Principal Problem:   Methamphetamine-induced psychotic disorder (HCC)  Total Time spent with patient: 20 minutes    Past Medical History:  Past Medical History:  Diagnosis Date   Drug dependence (HCC)    GI bleed    Hiatal hernia    Hypertension    Migraine    PTSD (post-traumatic stress disorder)     Past Surgical History:  Procedure Laterality Date   KNEE SURGERY     Family History:  Family History  Problem Relation Age of Onset   Heart failure Mother    Hypertension Father     Social History:  Social History   Substance and Sexual Activity  Alcohol Use Yes   Alcohol/week: 42.0 standard drinks of alcohol   Types: 42 Cans of beer per week   Comment: per pt he drinks 6-12 cans of beer daily     Social History   Substance and Sexual Activity  Drug Use Yes   Types: Cocaine, Marijuana, Amphetamines    Social History   Socioeconomic History   Marital status: Single    Spouse name: Not on file   Number of children: Not on file  Years of education: Not on file   Highest education level: Not on file  Occupational History   Not on file  Tobacco Use   Smoking status: Every Day    Current packs/day: 0.25    Types: Cigarettes   Smokeless tobacco: Never  Vaping Use   Vaping status: Never Used  Substance and Sexual Activity   Alcohol use: Yes    Alcohol/week: 42.0 standard drinks of alcohol    Types: 42 Cans of beer per week    Comment: per pt he drinks 6-12 cans of beer daily   Drug use: Yes    Types: Cocaine, Marijuana, Amphetamines   Sexual activity: Not Currently  Other Topics Concern   Not on file  Social History Narrative   Not  on file   Social Determinants of Health   Financial Resource Strain: Not on file  Food Insecurity: No Food Insecurity (10/17/2023)   Hunger Vital Sign    Worried About Running Out of Food in the Last Year: Never true    Ran Out of Food in the Last Year: Never true  Transportation Needs: No Transportation Needs (10/17/2023)   PRAPARE - Administrator, Civil Service (Medical): No    Lack of Transportation (Non-Medical): No  Physical Activity: Not on file  Stress: Not on file  Social Connections: Not on file   Additional Social History:                           Current Medications: Current Facility-Administered Medications  Medication Dose Route Frequency Provider Last Rate Last Admin   acetaminophen (TYLENOL) tablet 650 mg  650 mg Oral Q6H PRN Charm Rings, NP       alum & mag hydroxide-simeth (MAALOX/MYLANTA) 200-200-20 MG/5ML suspension 30 mL  30 mL Oral Q4H PRN Charm Rings, NP   30 mL at 10/17/23 1252   diphenhydrAMINE (BENADRYL) capsule 50 mg  50 mg Oral Q8H PRN Charm Rings, NP   50 mg at 10/18/23 1354   Or   diphenhydrAMINE (BENADRYL) injection 50 mg  50 mg Intramuscular Q8H PRN Charm Rings, NP       folic acid (FOLVITE) tablet 1 mg  1 mg Oral Daily Charm Rings, NP   1 mg at 10/20/23 0827   gabapentin (NEURONTIN) capsule 300 mg  300 mg Oral TID Charm Rings, NP   300 mg at 10/20/23 1218   haloperidol (HALDOL) tablet 5 mg  5 mg Oral Q8H PRN Charm Rings, NP   5 mg at 10/18/23 1354   Or   haloperidol lactate (HALDOL) injection 5 mg  5 mg Intramuscular Q8H PRN Charm Rings, NP       hydrochlorothiazide (HYDRODIURIL) tablet 12.5 mg  12.5 mg Oral Daily Charm Rings, NP   12.5 mg at 10/18/23 6295   hydrOXYzine (ATARAX) tablet 50 mg  50 mg Oral TID PRN Phineas Inches, MD   50 mg at 10/20/23 0828   LORazepam (ATIVAN) tablet 2 mg  2 mg Oral Q8H PRN Charm Rings, NP   2 mg at 10/18/23 1354   Or   LORazepam (ATIVAN) injection  2 mg  2 mg Intramuscular Q8H PRN Charm Rings, NP       multivitamin with minerals tablet 1 tablet  1 tablet Oral Daily Charm Rings, NP   1 tablet at 10/20/23 0827   nicotine (NICODERM CQ - dosed  in mg/24 hours) patch 14 mg  14 mg Transdermal Daily Charm Rings, NP   14 mg at 10/20/23 0828   OLANZapine (ZYPREXA) tablet 10 mg  10 mg Oral QHS Massengill, Harrold Donath, MD   10 mg at 10/19/23 2135   OLANZapine (ZYPREXA) tablet 5 mg  5 mg Oral Daily Massengill, Harrold Donath, MD   5 mg at 10/20/23 0827   pantoprazole (PROTONIX) EC tablet 40 mg  40 mg Oral Daily Massengill, Harrold Donath, MD   40 mg at 10/20/23 1914   thiamine (Vitamin B-1) tablet 100 mg  100 mg Oral Daily Charm Rings, NP   100 mg at 10/20/23 7829   Or   thiamine (VITAMIN B1) injection 100 mg  100 mg Intravenous Daily Charm Rings, NP       traZODone (DESYREL) tablet 50 mg  50 mg Oral QHS PRN Phineas Inches, MD   50 mg at 10/19/23 2135    Lab Results:  Results for orders placed or performed during the hospital encounter of 10/17/23 (from the past 48 hour(s))  Basic metabolic panel     Status: Abnormal   Collection Time: 10/18/23  6:53 PM  Result Value Ref Range   Sodium 138 135 - 145 mmol/L   Potassium 4.0 3.5 - 5.1 mmol/L   Chloride 106 98 - 111 mmol/L   CO2 23 22 - 32 mmol/L   Glucose, Bld 107 (H) 70 - 99 mg/dL    Comment: Glucose reference range applies only to samples taken after fasting for at least 8 hours.   BUN 14 6 - 20 mg/dL   Creatinine, Ser 5.62 (H) 0.61 - 1.24 mg/dL   Calcium 9.0 8.9 - 13.0 mg/dL   GFR, Estimated >86 >57 mL/min    Comment: (NOTE) Calculated using the CKD-EPI Creatinine Equation (2021)    Anion gap 9 5 - 15    Comment: Performed at [facility], [address] [place], [place] [address]  Valproic acid level     Status: Abnormal   Collection Time: 10/18/23  6:53 PM  Result Value Ref Range   Valproic Acid Lvl <10 (L) 50.0 - 100.0 ug/mL    Comment: RESULT CONFIRMED BY  MANUAL DILUTION Performed at [facility], [address], [place], [place] [address]   TSH     Status: None   Collection Time: 10/18/23  6:53 PM  Result Value Ref Range   TSH 1.429 0.350 - 4.500 uIU/mL    Comment: Performed by a 3rd Generation assay with a functional sensitivity of <=0.01 uIU/mL. Performed at [facility], [address], [place], [place] [address]     Blood Alcohol level:  Lab Results  Component Value Date   ETH <10 10/14/2023   ETH <10 10/07/2023    Metabolic Disorder Labs: Lab Results  Component Value Date   HGBA1C 5.1 02/11/2022   MPG 99.67 02/11/2022   MPG 119.76 06/19/2021   No results found for: "PROLACTIN" Lab Results  Component Value Date   CHOL 165 02/11/2022   TRIG 81 02/11/2022   HDL 47 02/11/2022   CHOLHDL 3.5 02/11/2022   VLDL 16 02/11/2022   LDLCALC 102 (H) 02/11/2022   LDLCALC 72 06/19/2021    Physical Findings: AIMS:  , ,  ,  ,    CIWA:  CIWA-Ar Total: 1 COWS:     Musculoskeletal: Strength & Muscle Tone: Lying in bed Gait & Station: Lying in bed Patient leans: Lying in bed  Psychiatric Specialty Exam:  Presentation  General Appearance:  Casual  Eye Contact: Fair  Speech: Slow  Speech Volume: Decreased  Handedness: Right   Mood and Affect  Mood: Anxious  Affect: Restricted   Thought Process  Thought Processes: Coherent  Descriptions of Associations:Intact  Orientation:Full (Time, Place and Person)  Thought Content:Rumination; Perseveration  History of Schizophrenia/Schizoaffective disorder:No data recorded Duration of Psychotic Symptoms:Greater than six months  Hallucinations:Hallucinations: None  Ideas of Reference:None  Suicidal Thoughts:Suicidal Thoughts: No  Homicidal Thoughts:Homicidal Thoughts: No   Sensorium  Memory: Immediate Fair; Remote Fair; Recent Fair  Judgment: Fair  Insight: Fair   Art therapist   Concentration: Fair  Attention Span: Fair  Recall: [employer] of Knowledge: Fair  Language: Fair   Psychomotor Activity  Psychomotor Activity: Psychomotor Activity: Normal   Assets  Assets: Communication Skills; Desire for Improvement   Sleep  Sleep: Sleep: Fair    Physical Exam: Physical Exam Constitutional:      Appearance: He is normal weight.  Pulmonary:     Effort: Pulmonary effort is normal.  Neurological:     Mental Status: He is alert.    Review of Systems  Psychiatric/Behavioral:  Positive for substance abuse. Negative for depression, hallucinations and suicidal ideas. The patient does not have insomnia.    Blood pressure (!) 123/98, pulse 78, temperature 98 F (36.7 C), temperature source Oral, resp. rate 18, height 5\' 6"  (1.676 m), weight 114.1 kg, SpO2 99%. Body mass index is 40.61 kg/m.   Treatment Plan Summary: Daily contact with patient to assess and evaluate symptoms and progress in treatment and Medication management   ASSESSMENT:   Diagnoses / Active Problems: History of schizophrenia. 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 Psychiatric Diagnoses and Treatment: No change in medications today.              -zyprexa from 5 mg bid -> to 5 mg qam and 10 mg at bedtime - for psychosis  -Continue home gabapentin 300 mg tid    -Continue atarax PRN and trazodone PRN   -Continue ativan PRN for any alcohol w/d    -Continue unit restrictions due  to threat of violence towards roommate (roommate moved to a different hall).    --  The risks/benefits/side-effects/alternatives to this medication were discussed in detail with the patient and time was given for questions. The patient consents to medication trial.                -- Metabolic profile and EKG monitoring obtained while on an atypical antipsychotic (BMI: Lipid Panel: HbgA1c: QTc:)              -- Encouraged patient to participate in  unit milieu and in scheduled group therapies              -- Short Term Goals: Ability to identify changes in lifestyle to reduce recurrence of condition will improve, Ability to verbalize feelings will improve, Ability to disclose and discuss suicidal ideas, Ability to demonstrate self-control will improve, Ability to identify and develop effective coping behaviors will improve, Ability to maintain clinical measurements within normal limits will improve, Compliance with prescribed medications will improve, and Ability to identify triggers associated with substance abuse/mental health issues will improve             -- Long Term Goals: Improvement in symptoms so as ready for discharge                3. Medical Issues Being Addressed:          4. Discharge Planning:              -- Social work and case management to assist with discharge planning and identification of hospital follow-up needs prior to discharge             -- Estimated LOS: Possible discharge by Thursday.             -- Discharge Concerns: Need to establish a safety plan; Medication compliance and effectiveness             -- Discharge Goals: Return home with outpatient referrals for mental health follow-up including medication management/psychotherapy   Rex Kras, MD 10/20/2023, 1:08 PM  Total Time Spent in Direct Patient Care:  I personally spent 35 minutes on the unit in direct patient care. The direct patient care time included face-to-face time with the patient, reviewing  the patient's chart, communicating with other professionals, and coordinating care. Greater than 50% of this time was spent in counseling or coordinating care with the patient regarding goals of hospitalization, psycho-education, and discharge planning needs.   Phineas Inches, MD Psychiatrist    Patient ID: Jeremy Frank, male   DOB: [birth year], 37 y.o.   MRN: [id number]

## 2023-10-20 NOTE — Progress Notes (Signed)
   10/20/23 1200  Psych Admission Type (Psych Patients Only)  Admission Status Involuntary  Psychosocial Assessment  Patient Complaints None  Eye Contact Brief  Facial Expression Flat  Affect Blunted  Speech Logical/coherent  Interaction Assertive  Motor Activity Slow  Appearance/Hygiene In scrubs  Behavior Characteristics Cooperative;Calm  Mood Anxious;Preoccupied  Thought Process  Coherency WDL  Content WDL  Delusions None reported or observed  Perception WDL  Hallucination None reported or observed  Judgment Poor  Confusion None  Danger to Self  Current suicidal ideation? Denies  Danger to Others  Danger to Others None reported or observed

## 2023-10-20 NOTE — Progress Notes (Signed)
   10/20/23 0005  Psych Admission Type (Psych Patients Only)  Admission Status Involuntary  Psychosocial Assessment  Patient Complaints None  Eye Contact Poor  Facial Expression Flat  Affect Blunted;Flat  Speech Logical/coherent  Interaction Assertive  Motor Activity Slow  Appearance/Hygiene In scrubs  Behavior Characteristics Cooperative;Guarded  Mood Pleasant  Thought Process  Coherency WDL  Content WDL  Delusions None reported or observed  Perception WDL  Hallucination None reported or observed  Judgment Poor  Confusion None  Danger to Self  Current suicidal ideation? Denies  Danger to Others  Danger to Others None reported or observed
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
## 2023-10-20 NOTE — Plan of Care (Signed)
  Problem: Education: Goal: Knowledge of [facility] Education information/materials will improve Outcome: Progressing Goal: Emotional status will improve Outcome: Progressing Goal: Mental status will improve Outcome: Progressing Goal: Verbalization of understanding the information provided will improve Outcome: Progressing   

## 2023-10-20 NOTE — BHH Group Notes (Signed)
B[facility] Group Notes:  (Nursing/MHT/Case Management/Adjunct)  Date:  10/20/2023  Time:  11:12 PM  Type of Therapy:  Psychoeducational Skills  Participation Level:  Did Not Attend  Participation Quality:  Resistant  Affect:  Resistant  Cognitive:  Lacking  Insight:  None  Engagement in Group:  Resistant  Modes of Intervention:  Education  Summary of Progress/Problems: Patient did not attend group this evening.   Hazle Coca S 10/20/2023, 11:12 PM

## 2023-10-20 NOTE — Group Note (Signed)
LCSW Group Therapy Note  Group Date: 10/20/2023 Start Time: 1100 End Time: 1200   Type of Therapy and Topic:  Group Therapy - How To Cope with Nervousness about Discharge   Participation Level:  Did Not Attend   Description of Group This process group involved identification of patients' feelings about discharge. Some of them are scheduled to be discharged soon, while others are new admissions, but each of them was asked to share thoughts and feelings surrounding discharge from the hospital. One common theme was that they are excited at the prospect of going home, while another was that many of them are apprehensive about sharing why they were hospitalized. Patients were given the opportunity to discuss these feelings with their peers in preparation for discharge.  Therapeutic Goals  Patient will identify their overall feelings about pending discharge. Patient will think about how they might proactively address issues that they believe will once again arise once they get home (i.e. with parents). Patients will participate in discussion about having hope for change.    Therapeutic Modalities Cognitive Behavioral Therapy   Ane Payment, LCSW 10/20/2023  3:03 PM

## 2023-10-20 NOTE — Group Note (Signed)
Recreation Therapy Group Note   Group Topic:Animal Assisted Therapy   Group Date: 10/20/2023 Start Time: 1005 End Time: 1040 Facilitators: Zyah Gomm-McCall, LRT,CTRS Location: [address] Dayroom   Animal-Assisted Activity (AAA) Program Checklist/Progress Notes Patient Eligibility Criteria Checklist & Daily Group note for Rec Tx Intervention  AAA/T Program Assumption of Risk Form signed by Patient/ or Parent Legal Guardian Yes  Patient understands his/her participation is voluntary Yes  Education: Charity fundraiser, Appropriate Animal Interaction   Education Outcome: Acknowledges education.    Affect/Mood: N/A   Participation Level: Did not attend    Clinical Observations/Individualized Feedback:     Plan: Continue to engage patient in RT group sessions 2-3x/week.   Coury Grieger-McCall, LRT,CTRS 10/20/2023 1:16 PM

## 2023-10-21 DIAGNOSIS — F15959 Other stimulant use, unspecified with stimulant-induced psychotic disorder, unspecified: Secondary | ICD-10-CM | POA: Diagnosis not present

## 2023-10-21 NOTE — Progress Notes (Signed)
[facility] MD Progress Note  10/21/2023 12:01 PM Jeremy Frank  MRN:  [id number]  Subjective:   Patient is a 37 year old male that per ED records has a history of schizophrenia, who was IVC by his mother due to psychosis and aggressive behaviors at home. Patient was medically cleared by outside hospital and transferred to this behavioral health hospital.   Staff reports that the patient slept 11.25 hours.  He had an episode earlier this morning and required Haldol Benadryl and lorazepam because of agitation.  No other complaints.  Patient has been compliant with treatment.   Yesterday the psychiatry team made the following recommendations: -Continue zyprexa from 5 mg bid -> to 5 mg qam and 10 mg at bedtime - for psychosis  -Continue home gabapentin 300 mg tid    -Continue atarax PRN and trazodone PRN   -Continue ativan PRN for any alcohol w/d   The patient was seen and evaluated and the chart was reviewed.  He reports that he slept well and reports that he does still have episodes when he gets pretty angry and frustrated with his present situation.  He states that this situation "sucks" and states that he cannot go back and live with his mother.  He has multiple things that he has to take care of.  He states that he has a car that he wants to sell, collect his personal belongings and release something personal that he has at the pawn shop.  After he takes care of all this, he wants to go to the shelter.  He has contacted his mother yesterday who is agreed to having come back to pick up his belongings. The CSW has provided him with phone numbers for multiple shelters in [place], [place] and [place].  He has agreed to follow up on that.  He denies any acute withdrawals today.  However he remains labile and mildly intrusive and sometimes sexually inappropriate with his others.  He states that in the past he was on Depakote and Zyprexa but he prefers not to take the Depakote.  He agrees to  continue with the Zyprexa.  He is contracting for safety.    Past medical history: Patient denies any acute or chronic medical condition.  Denies any seizure or surgical history.  NKDA.  Substance use history: Patient states I do not know, in regards to alcohol, nicotine, or other illicit drug use  Family history: Patient states I cannot disclose this, in regards to questioning if his family has any psychiatric history or history of suicide attempts.  Social history: Patient reports that he lives with his "so-called mother".  Reports he is single has no children.  Reports he is VA connected and that his home [facility] is [place].    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(HCC) Diagnosis: Principal Problem:   Methamphetamine-induced psychotic disorder (HCC)  Total Time spent with patient: 20 minutes    Past Medical History:  Past Medical History:  Diagnosis Date   Drug dependence (HCC)    GI bleed    Hiatal hernia    Hypertension    Migraine    PTSD (post-traumatic stress disorder)     Past Surgical History:  Procedure Laterality Date   KNEE SURGERY     Family History:  Family History  Problem Relation Age of Onset   Heart failure Mother    Hypertension Father     Social History:  Social History   Substance and Sexual Activity  Alcohol Use Yes   Alcohol/week:  42.0 standard drinks of alcohol   Types: 42 Cans of beer per week   Comment: per pt he drinks 6-12 cans of beer daily     Social History   Substance and Sexual Activity  Drug Use Yes   Types: Cocaine, Marijuana, Amphetamines    Social History   Socioeconomic History   Marital status: Single    Spouse name: Not on file   Number of children: Not on file   Years of education: Not on file   Highest education level: Not on file  Occupational History   Not on file  Tobacco Use   Smoking status: Every Day    Current packs/day: 0.25    Types: Cigarettes   Smokeless tobacco: Never   Vaping Use   Vaping status: Never Used  Substance and Sexual Activity   Alcohol use: Yes    Alcohol/week: 42.0 standard drinks of alcohol    Types: 42 Cans of beer per week    Comment: per pt he drinks 6-12 cans of beer daily   Drug use: Yes    Types: Cocaine, Marijuana, Amphetamines   Sexual activity: Not Currently  Other Topics Concern   Not on file  Social History Narrative   Not on file   Social Determinants of Health   Financial Resource Strain: Not on file  Food Insecurity: No Food Insecurity (10/17/2023)   Hunger Vital Sign    Worried About Running Out of Food in the Last Year: Never true    Ran Out of Food in the Last Year: Never true  Transportation Needs: No Transportation Needs (10/17/2023)   PRAPARE - Administrator, Civil Service (Medical): No    Lack of Transportation (Non-Medical): No  Physical Activity: Not on file  Stress: Not on file  Social Connections: Not on file   Additional Social History:                           Current Medications: Current Facility-Administered Medications  Medication Dose Route Frequency Provider Last Rate Last Admin   acetaminophen (TYLENOL) tablet 650 mg  650 mg Oral Q6H PRN Charm Rings, NP       alum & mag hydroxide-simeth (MAALOX/MYLANTA) 200-200-20 MG/5ML suspension 30 mL  30 mL Oral Q4H PRN Charm Rings, NP   30 mL at 10/17/23 1252   diphenhydrAMINE (BENADRYL) capsule 50 mg  50 mg Oral Q8H PRN Charm Rings, NP   50 mg at 10/21/23 6578   Or   diphenhydrAMINE (BENADRYL) injection 50 mg  50 mg Intramuscular Q8H PRN Charm Rings, NP       folic acid (FOLVITE) tablet 1 mg  1 mg Oral Daily Charm Rings, NP   1 mg at 10/20/23 0827   gabapentin (NEURONTIN) capsule 300 mg  300 mg Oral TID Charm Rings, NP   300 mg at 10/21/23 1139   haloperidol (HALDOL) tablet 5 mg  5 mg Oral Q8H PRN Charm Rings, NP   5 mg at 10/18/23 1354   Or   haloperidol lactate (HALDOL) injection 5 mg  5 mg  Intramuscular Q8H PRN Charm Rings, NP       hydrochlorothiazide (HYDRODIURIL) tablet 12.5 mg  12.5 mg Oral Daily Charm Rings, NP   12.5 mg at 10/18/23 4696   hydrOXYzine (ATARAX) tablet 50 mg  50 mg Oral TID PRN Phineas Inches, MD   50 mg at 10/20/23  1813   LORazepam (ATIVAN) tablet 2 mg  2 mg Oral Q8H PRN Charm Rings, NP   2 mg at 10/21/23 7829   Or   LORazepam (ATIVAN) injection 2 mg  2 mg Intramuscular Q8H PRN Charm Rings, NP       multivitamin with minerals tablet 1 tablet  1 tablet Oral Daily Charm Rings, NP   1 tablet at 10/20/23 0827   nicotine (NICODERM CQ - dosed in mg/24 hours) patch 14 mg  14 mg Transdermal Daily Charm Rings, NP   14 mg at 10/21/23 0834   OLANZapine (ZYPREXA) tablet 10 mg  10 mg Oral QHS Massengill, Harrold Donath, MD   10 mg at 10/20/23 2135   OLANZapine (ZYPREXA) tablet 5 mg  5 mg Oral Daily Massengill, Harrold Donath, MD   5 mg at 10/21/23 0834   pantoprazole (PROTONIX) EC tablet 40 mg  40 mg Oral Daily Massengill, Harrold Donath, MD   40 mg at 10/21/23 5621   thiamine (Vitamin B-1) tablet 100 mg  100 mg Oral Daily Charm Rings, NP   100 mg at 10/20/23 3086   Or   thiamine (VITAMIN B1) injection 100 mg  100 mg Intravenous Daily Charm Rings, NP       traZODone (DESYREL) tablet 50 mg  50 mg Oral QHS PRN Phineas Inches, MD   50 mg at 10/19/23 2135    Lab Results:  No results found for this or any previous visit (from the past 48 hour(s)).   Blood Alcohol level:  Lab Results  Component Value Date   ETH <10 10/14/2023   ETH <10 10/07/2023    Metabolic Disorder Labs: Lab Results  Component Value Date   HGBA1C 5.1 02/11/2022   MPG 99.67 02/11/2022   MPG 119.76 06/19/2021   No results found for: "PROLACTIN" Lab Results  Component Value Date   CHOL 165 02/11/2022   TRIG 81 02/11/2022   HDL 47 02/11/2022   CHOLHDL 3.5 02/11/2022   VLDL 16 02/11/2022   LDLCALC 102 (H) 02/11/2022   LDLCALC 72 06/19/2021    Physical Findings: AIMS:  , ,   ,  ,    CIWA:  CIWA-Ar Total: 0 COWS:     Musculoskeletal: Strength & Muscle Tone: Lying in bed Gait & Station: Lying in bed Patient leans: Lying in bed  Psychiatric Specialty Exam:  Presentation  General Appearance:  Casual  Eye Contact: Fair  Speech: Normal Rate  Speech Volume: Normal  Handedness: Right   Mood and Affect  Mood: Anxious  Affect: Congruent   Thought Process  Thought Processes: Coherent; Goal Directed  Descriptions of Associations:Tangential  Orientation:Full (Time, Place and Person)  Thought Content:Perseveration; Rumination  History of Schizophrenia/Schizoaffective disorder:No data recorded Duration of Psychotic Symptoms:Greater than six months  Hallucinations:Hallucinations: Auditory  Ideas of Reference:Delusions  Suicidal Thoughts:Suicidal Thoughts: No  Homicidal Thoughts:Homicidal Thoughts: No   Sensorium  Memory: Immediate Fair; Recent Fair; Remote Fair  Judgment: Poor  Insight: Fair   Chartered certified accountant: Fair  Attention Span: Fair  Recall: [employer] of Knowledge: Fair  Language: Fair   Psychomotor Activity  Psychomotor Activity: Psychomotor Activity: Normal   Assets  Assets: Manufacturing systems engineer; Desire for Improvement   Sleep  Sleep: Sleep: Good Number of Hours of Sleep: 11    Physical Exam: Physical Exam Constitutional:      Appearance: He is normal weight.  Pulmonary:     Effort: Pulmonary effort is normal.  Neurological:  General: No focal deficit present.     Mental Status: He is alert and oriented to person, place, and time. Mental status is at baseline.    Review of Systems  Psychiatric/Behavioral:  Positive for substance abuse. Negative for depression, hallucinations and suicidal ideas. The patient does not have insomnia.    Blood pressure 113/88, pulse 82, temperature 98.3 F (36.8 C), temperature source Oral, resp. rate 16, height 5\' 6"  (1.676 m),  weight 114.1 kg, SpO2 99%. Body mass index is 40.61 kg/m.   Treatment Plan Summary: Daily contact with patient to assess and evaluate symptoms and progress in treatment and Medication management   ASSESSMENT:   Diagnoses / Active Problems: History of schizophrenia. 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 Psychiatric Diagnoses and Treatment: No change in medications today.              -zyprexa from 5 mg bid -> to 5 mg qam and 10 mg at bedtime - for psychosis  -Continue home gabapentin 300 mg tid    -Continue atarax PRN and trazodone PRN   -Continue ativan PRN for any alcohol w/d    -Continue unit restrictions due to threat of violence towards roommate (roommate moved to a different hall).    --  The risks/benefits/side-effects/alternatives to this medication were discussed in detail with the patient and time was given for questions. The patient consents to medication trial.                -- Metabolic profile and EKG monitoring obtained while on an atypical antipsychotic (BMI: Lipid Panel: HbgA1c: QTc:)              -- Encouraged patient to participate in unit milieu and in scheduled group therapies              -- Short Term Goals: Ability to identify changes in lifestyle to reduce recurrence of condition will improve, Ability to verbalize feelings will improve, Ability to disclose and discuss suicidal ideas, Ability to demonstrate self-control will improve, Ability to identify and develop  effective coping behaviors will improve, Ability to maintain clinical measurements within normal limits will improve, Compliance with prescribed medications will improve, and Ability to identify triggers associated with substance abuse/mental health issues will improve             -- Long Term Goals: Improvement in symptoms so as ready for discharge                3. Medical Issues Being Addressed:          4. Discharge Planning:              -- Social work and case  management to assist with discharge planning and identification of hospital follow-up needs prior to discharge             -- Estimated LOS: Discharge planning to a shelter hopefully by Friday.             -- Discharge Concerns: Need to establish a safety plan; Medication compliance and effectiveness             -- Discharge Goals: Return home with outpatient referrals for mental health follow-up including medication management/psychotherapy   Rex Kras, MD 10/21/2023, 12:01 PM  Total Time Spent in Direct Patient Care:  I personally spent 35 minutes on the unit in direct patient care. The direct patient care time included face-to-face time with the patient, reviewing the patient's chart, communicating with other professionals, and coordinating care. Greater than 50% of this time was spent in counseling or coordinating care with the patient regarding goals of hospitalization, psycho-education, and discharge planning needs.   Phineas Inches, MD Psychiatrist    Patient ID: Jeremy Frank, male   DOB: 09-Jan-1986, 37 y.o.   MRN: [id number] Patient ID: Jeremy Frank, male   DOB: [birth year], 37 y.o.   MRN: [id number]

## 2023-10-21 NOTE — Plan of Care (Signed)

## 2023-10-21 NOTE — Progress Notes (Signed)
   10/20/23 2135  Psych Admission Type (Psych Patients Only)  Admission Status Involuntary  Psychosocial Assessment  Patient Complaints None  Eye Contact Brief  Facial Expression Flat  Affect Blunted  Speech Logical/coherent  Interaction Assertive;Minimal  Motor Activity Slow  Appearance/Hygiene In scrubs  Behavior Characteristics Cooperative;Guarded  Mood Pleasant  Thought Process  Coherency WDL  Content WDL  Delusions None reported or observed  Perception WDL  Hallucination None reported or observed  Judgment Impaired  Confusion None  Danger to Self  Current suicidal ideation? Denies  Danger to Others  Danger to Others None reported or observed
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
## 2023-10-21 NOTE — [facility] Group Notes (Signed)
[facility] Group Notes:  (Nursing/MHT/Case Management/Adjunct)  Date:  10/21/2023  Time:  9:04 AM  Type of Therapy:  Group Topic/ Focus: Goals Group: The focus of this group is to help patients establish daily goals to achieve during treatment and discuss how the patient can incorporate goal setting into their daily lives to aide in recovery.   Participation Level:  Active  Participation Quality:  Appropriate  Affect:  Appropriate  Cognitive:  Appropriate  Insight:  Appropriate  Engagement in Group:  Engaged  Modes of Intervention:  Discussion  Summary of Progress/Problems:  Patient attended and participated goals group today. Patient's goal for today is to figure out where he plans on going after he discharges.   Daneil Dan 10/21/2023, 9:04 AM

## 2023-10-21 NOTE — Progress Notes (Signed)
   10/21/23 2300  Psych Admission Type (Psych Patients Only)  Admission Status Involuntary  Psychosocial Assessment  Patient Complaints Anxiety;Depression  Eye Contact Fair  Facial Expression Flat  Affect Appropriate to circumstance  Speech Logical/coherent  Interaction Assertive  Motor Activity Slow  Appearance/Hygiene Unremarkable  Behavior Characteristics Cooperative;Appropriate to situation  Mood Pleasant  Thought Process  Content WDL  Delusions None reported or observed  Perception WDL  Hallucination None reported or observed  Judgment Impaired  Confusion None  Danger to Self  Current suicidal ideation? Denies  Danger to Others  Danger to Others None reported or observed

## 2023-10-21 NOTE — Progress Notes (Signed)
[facility]/BMU LCSW Progress Note   10/21/2023    3:39 PM  Jeremy Frank      Type of Note: Court Letter E-Filed    A letter was sent to [facility] on behalf of Pt's upcoming criminal court hearings that are scheduled for tomorrow 10/24 @ 8:30 AM and Friday 10/25 @ 1:30 PM . CSW will continue to assist.    Signed:   Jacob Moores, MSW, [facility] 10/21/2023 3:39 PM

## 2023-10-21 NOTE — Group Note (Signed)
Date:  10/21/2023 Time:  11:19 PM  Group Topic/Focus:  Narcotics Anonymous (NA) Meeting    Participation Level:  Did Not Attend  Participation Quality:   N/A  Affect:   N/A  Cognitive:   N/A  Insight: None  Engagement in Group:   N/A  Modes of Intervention:   N/A  Additional Comments:  Patient did not attend NA Meeting.   Kennieth Francois 10/21/2023, 11:19 PM

## 2023-10-21 NOTE — Plan of Care (Signed)
  Problem: Safety: Goal: Periods of time without injury will increase Outcome: Progressing   

## 2023-10-21 NOTE — Progress Notes (Signed)
   10/21/23 0601  15 Minute Checks  Location Bedroom  Visual Appearance Calm  Behavior Sleeping  Sleep (Behavioral Health Patients Only)  Calculate sleep? (Click Yes once per 24 hr at 0600 safety check) Yes  Documented sleep last 24 hours 11.25

## 2023-10-21 NOTE — Progress Notes (Signed)
[facility]/BMU LCSW Progress Note   10/21/2023    9:07 AM  Jeremy Frank      Type of Note: Shelter Resources provided    CSW provide patient with shelter resources in [place] , [place], and [place]. Patient shared that he did not want to go to the [place] shelter due to not liking how they had treated last time . Patient stated, " I will call the other places to see about there shelters " . CSW will continue to assist.     Signed:   Jacob Moores, MSW, [facility] 10/21/2023 9:07 AM

## 2023-10-21 NOTE — [facility] Group Notes (Signed)

## 2023-10-22 DIAGNOSIS — F15959 Other stimulant use, unspecified with stimulant-induced psychotic disorder, unspecified: Secondary | ICD-10-CM | POA: Diagnosis not present

## 2023-10-22 LAB — LIPID PANEL
Cholesterol: 146 mg/dL (ref 0–200)
HDL: 48 mg/dL (ref >40)
LDL Cholesterol: 71 mg/dL (ref 0–99)
Total CHOL/HDL Ratio: 3 {ratio}
Triglycerides: 133 mg/dL (ref <150)
VLDL: 27 mg/dL (ref 0–40)

## 2023-10-22 LAB — HEMOGLOBIN A1C
Hgb A1c MFr Bld: 5.3 % (ref 4.8–5.6)
Mean Plasma Glucose: 105.41 mg/dL

## 2023-10-22 MED ORDER — OLANZAPINE 7.5 MG PO TABS
15.0000 mg | ORAL_TABLET | Freq: Every day | ORAL | Status: DC
  Administered 2023-10-22: 15 mg via ORAL
  Filled 2023-10-22 (×2): qty 2

## 2023-10-22 NOTE — Plan of Care (Signed)
  Problem: Education: Goal: Knowledge of [facility] Education information/materials will improve Outcome: Progressing   Problem: Activity: Goal: Interest or engagement in activities will improve Outcome: Progressing   Problem: Safety: Goal: Periods of time without injury will increase Outcome: Progressing   Problem: Activity: Goal: Interest or engagement in activities will improve Outcome: Progressing

## 2023-10-22 NOTE — Progress Notes (Addendum)
[facility]/BMU LCSW Progress Note   10/22/2023    2:18 PM  Jeremy Frank      Type of Note: Attempt to Call Mom   CSW tried to reach out to patient mom to inform her that patient is scheduled to DC tomorrow, so she can bring his belongings up to hospital but mom did not answer nor was CSW able to leave a voicemail. CSW will continue to assist.     CSW was able to get in contact with patient mom around 3:20 PM and shared with her that patient will be DC tomorrow and that he stated that he needed to get back to his car . Mom stated that she will bring patient belongings tomorrow once she get off with her granddaughter or niece at the local shelter in [facility] , which CSW gave mom the address too [address] . Mom stated adamantly, " let him figure out how to get back here because that car he wants to get to is not even running and I do not want him coming around since I have that restraining order".   Signed:   Jacob Moores, MSW, LCSWA 10/22/2023 2:18 PM

## 2023-10-22 NOTE — Progress Notes (Signed)
Adult Psychoeducational Group Note  Date:  10/22/2023 Time:  10:12 PM  Group Topic/Focus:  Wrap-Up Group:   The focus of this group is to help patients review their daily goal of treatment and discuss progress on daily workbooks.  Participation Level:  Active  Participation Quality:  Appropriate  Affect:  Appropriate  Cognitive:  Appropriate  Insight: Appropriate  Engagement in Group:  Engaged  Modes of Intervention:  Discussion  Additional Comments:  Pt stated he had a productive day.  Pt stated his goal was to get things in order before discharge tomorrow. Pt met goal.  Lucilla Lame 10/22/2023, 10:12 PM
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
## 2023-10-22 NOTE — Progress Notes (Signed)
BHH/BMU LCSW Progress Note   10/22/2023    1:50 PM  Jeremy Frank      Type of Note: Visit with Patient   CSW spoke with patient and explained to him that he could return back to his moms house and patient started smiling , acting very childlike saying , "  I already knew that , but I need to go get my stuff and car" . CSW explained to patient that his mom took out a restraining order and that he would not be getting a ride to return back to the area near moms house and informed him that she said that she would bring his belongings up here and  added 25$ into his account. Patient stated, " I don't care about violating the order, I want to get my own stuff and I need to get to my car." CSW told patient that getting him a ride to his car was not a safe DC because the car does not run, it broke down. Patient did not agree with what CSW was saying and stated, " well I just need to get my car because it is on private property, but thank you for talking to me I will try to call my mom ". CSW will continue to assist.     Signed:   Jacob Moores, MSW, LCSWA 10/22/2023 1:50 PM

## 2023-10-22 NOTE — Group Note (Signed)
LCSW Group Therapy Note   Group Date: 10/22/2023 Start Time: 1100 End Time: 1200   Type of Therapy and Topic:  Group Therapy: Challenging Core Beliefs  Participation Level:  Active  Description of Group:  Patients were educated about core beliefs and asked to identify one harmful core belief that they have. Patients were asked to explore from where those beliefs originate. Patients were asked to discuss how those beliefs make them feel and the resulting behaviors of those beliefs. They were then be asked if those beliefs are true and, if so, what evidence they have to support them. Lastly, group members were challenged to replace those negative core beliefs with helpful beliefs.   Therapeutic Goals:   1. Patient will identify harmful core beliefs and explore the origins of such beliefs. 2. Patient will identify feelings and behaviors that result from those core beliefs. 3. Patient will discuss whether such beliefs are true. 4.  Patient will replace harmful core beliefs with helpful ones.  Summary of Patient Progress:  Jeremy Frank actively engaged in processing and exploring how core beliefs are formed and how they impact thoughts, feelings, and behaviors. Patient proved open to input from peers and feedback from CSW. Patient demonstrated appropriate insight into the subject matter, was respectful and supportive of peers, and participated throughout the entire session.  Therapeutic Modalities: Cognitive Behavioral Therapy; Solution-Focused Therapy   Kathi Der, LCSWA 10/22/2023  12:18 PM

## 2023-10-22 NOTE — Progress Notes (Signed)
Jeremy Frank is ambulatory in hallway interacting with Child psychotherapist and staff. Loud attention seeking but cooperative Taking scheduled meds and intermittently participating in groups. Denies SI/HI/AVH He voices no complaints to this nurse

## 2023-10-22 NOTE — [facility] Suicide Risk Assessment (Signed)
[facility] INPATIENT:  Family/Significant Other Suicide Prevention Education  Suicide Prevention Education:  Education Completed; Jeremy Frank (mom) [phone],  (name of family member/significant other) has been identified by the patient as the family member/significant other with whom the patient will be residing, and identified as the person(s) who will aid the patient in the event of a mental health crisis (suicidal ideations/suicide attempt).  With written consent from the patient, the family member/significant other has been provided the following suicide prevention education, prior to the and/or following the discharge of the patient.  Safety planning was completed with mom. Mom stated that patient could not return back to her house because he talks to himself at night, does drugs and drink alcohol with his prescription medication, and has damaged property inside the home. Mom was very adamant about needing a break and  for CSW to send him to a shelter. Mom said that she has washed all patient clothes and even put 25$ in his account but she is tired of carrying and worrying about him since he does not want to do right. Mom said that she can bring patient belongings up here but since she has taken out a restraining order she does not want him coming to her house or being around her.  The suicide prevention education provided includes the following: Suicide risk factors Suicide prevention and interventions National Suicide Hotline telephone number [facility] assessment telephone number [facility] Emergency Assistance 911 [facility] and/or Residential Mobile Crisis Unit telephone number  Request made of family/significant other to: Remove weapons (e.g., guns, rifles, knives), all items previously/currently identified as safety concern.   Remove drugs/medications (over-the-counter, prescriptions, illicit drugs), all items previously/currently identified as a safety  concern.  The family member/significant other verbalizes understanding of the suicide prevention education information provided.  The family member/significant other agrees to remove the items of safety concern listed above.  Jeremy Frank 10/22/2023, 1:46 PM

## 2023-10-22 NOTE — Plan of Care (Signed)
  Problem: Education: Goal: Knowledge of [facility] Education information/materials will improve Outcome: Not Progressing Goal: Emotional status will improve Outcome: Not Progressing Goal: Mental status will improve Outcome: Not Progressing Goal: Verbalization of understanding the information provided will improve Outcome: Not Progressing   Problem: Activity: Goal: Interest or engagement in activities will improve Outcome: Not Progressing Goal: Sleeping patterns will improve Outcome: Not Progressing   Problem: Coping: Goal: Ability to verbalize frustrations and anger appropriately will improve Outcome: Not Progressing Goal: Ability to demonstrate self-control will improve Outcome: Not Progressing   Problem: Health Behavior/Discharge Planning: Goal: Identification of resources available to assist in meeting health care needs will improve Outcome: Not Progressing Goal: Compliance with treatment plan for underlying cause of condition will improve Outcome: Not Progressing   Problem: Physical Regulation: Goal: Ability to maintain clinical measurements within normal limits will improve Outcome: Not Progressing   Problem: Safety: Goal: Periods of time without injury will increase Outcome: Not Progressing   Problem: Education: Goal: Knowledge of [facility] Education information/materials will improve Outcome: Not Progressing Goal: Emotional status will improve Outcome: Not Progressing Goal: Mental status will improve Outcome: Not Progressing Goal: Verbalization of understanding the information provided will improve Outcome: Not Progressing   Problem: Activity: Goal: Interest or engagement in activities will improve Outcome: Not Progressing Goal: Sleeping patterns will improve Outcome: Not Progressing   Problem: Coping: Goal: Ability to verbalize frustrations and anger appropriately will improve Outcome: Not Progressing Goal: Ability to demonstrate  self-control will improve Outcome: Not Progressing   Problem: Health Behavior/Discharge Planning: Goal: Identification of resources available to assist in meeting health care needs will improve Outcome: Not Progressing Goal: Compliance with treatment plan for underlying cause of condition will improve Outcome: Not Progressing   Problem: Physical Regulation: Goal: Ability to maintain clinical measurements within normal limits will improve Outcome: Not Progressing   Problem: Safety: Goal: Periods of time without injury will increase Outcome: Not Progressing   Problem: Activity: Goal: Will identify at least one activity in which they can participate Outcome: Not Progressing   Problem: Coping: Goal: Ability to identify and develop effective coping behavior will improve Outcome: Not Progressing Goal: Ability to interact with others will improve Outcome: Not Progressing Goal: Demonstration of participation in decision-making regarding own care will improve Outcome: Not Progressing Goal: Ability to use eye contact when communicating with others will improve Outcome: Not Progressing   Problem: Health Behavior/Discharge Planning: Goal: Identification of resources available to assist in meeting health care needs will improve Outcome: Not Progressing   Problem: Self-Concept: Goal: Will verbalize positive feelings about self Outcome: Not Progressing   Problem: Education: Goal: Knowledge of disease or condition will improve Outcome: Not Progressing Goal: Understanding of discharge needs will improve Outcome: Not Progressing   Problem: Health Behavior/Discharge Planning: Goal: Ability to identify changes in lifestyle to reduce recurrence of condition will improve Outcome: Not Progressing Goal: Identification of resources available to assist in meeting health care needs will improve Outcome: Not Progressing   Problem: Physical Regulation: Goal: Complications related to the  disease process, condition or treatment will be avoided or minimized Outcome: Not Progressing   Problem: Safety: Goal: Ability to remain free from injury will improve Outcome: Not Progressing

## 2023-10-22 NOTE — Progress Notes (Signed)
   10/22/23 2200  Psych Admission Type (Psych Patients Only)  Admission Status Involuntary  Psychosocial Assessment  Patient Complaints Anxiety;Depression  Eye Contact Fair  Facial Expression Flat  Affect Appropriate to circumstance  Speech Logical/coherent  Interaction Assertive  Motor Activity Slow  Appearance/Hygiene Unremarkable  Behavior Characteristics Cooperative;Appropriate to situation  Mood Pleasant  Thought Process  Coherency WDL  Content WDL  Delusions None reported or observed  Perception WDL  Hallucination None reported or observed  Judgment Impaired  Confusion None  Danger to Self  Current suicidal ideation? Denies  Danger to Others  Danger to Others None reported or observed

## 2023-10-23 DIAGNOSIS — F15959 Other stimulant use, unspecified with stimulant-induced psychotic disorder, unspecified: Secondary | ICD-10-CM | POA: Diagnosis not present

## 2023-10-23 MED ORDER — HYDROXYZINE HCL 50 MG PO TABS: 50.0000 mg | ORAL_TABLET | Freq: Three times a day (TID) | ORAL | 0 refills | Status: AC | PRN

## 2023-10-23 MED ORDER — TRAZODONE HCL 50 MG PO TABS: 50.0000 mg | ORAL_TABLET | Freq: Every evening | ORAL | 0 refills | Status: AC | PRN

## 2023-10-23 MED ORDER — OLANZAPINE 15 MG PO TABS: 15.0000 mg | ORAL_TABLET | Freq: Every day | ORAL | 0 refills | Status: AC

## 2023-10-23 NOTE — Discharge Summary (Signed)
Physician Discharge Summary Note  Patient:  Jeremy Frank is an 37 y.o., male MRN:  [id number] DOB:  02-24-1986 Patient phone:  [phone] (home)  Patient address:   [address] [place] [address],  Total Time spent with patient: 30 minutes  Date of Admission:  10/17/2023 Date of Discharge: 10/23/2023  Reason for Admission:  Patient is a 37 year old male that per ED records has a history of schizophrenia, who was IVC by his mother due to psychosis and aggressive behaviors at home. Patient was medically cleared by outside hospital and transferred to this behavioral health hospital.   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[facility])   Past Psychiatric History: Hx of TBI, polysubstance abuse, PTSD, MDD   Past Medical History:  Past Medical History:  Diagnosis Date   Drug dependence (HCC)    GI bleed    Hiatal hernia    Hypertension    Migraine    PTSD (post-traumatic stress disorder)     Past Surgical History:  Procedure Laterality Date   KNEE SURGERY     Family History:  Family History  Problem Relation Age of Onset   Heart failure Mother    Hypertension Father    Family Psychiatric  History: Please see H&P Social History:  Social History   Substance and Sexual Activity  Alcohol Use Yes   Alcohol/week: 42.0 standard drinks of alcohol   Types: 42 Cans of beer per week   Comment: per pt he drinks 6-12 cans of beer daily     Social History   Substance and Sexual Activity  Drug Use Yes   Types: Cocaine, Marijuana, Amphetamines    Social History   Socioeconomic History   Marital status: Single    Spouse name: Not on file   Number of children: Not on file   Years of education: Not on file   Highest education level: Not on file  Occupational History   Not on file  Tobacco Use   Smoking status: Every Day    Current packs/day: 0.25     Types: Cigarettes   Smokeless tobacco: Never  Vaping Use   Vaping status: Never Used  Substance and Sexual Activity   Alcohol use: Yes    Alcohol/week: 42.0 standard drinks of alcohol    Types: 42 Cans of beer per week    Comment: per pt he drinks 6-12 cans of beer daily   Drug use: Yes    Types: Cocaine, Marijuana, Amphetamines   Sexual activity: Not Currently  Other Topics Concern   Not on file  Social History Narrative   Not on file   Social Determinants of Health   Financial Resource Strain: Not on file  Food Insecurity: No Food Insecurity (10/17/2023)   Hunger Vital Sign    Worried About Running Out of Food in the Last Year: Never true    Ran Out of Food in the Last Year: Never true  Transportation Needs: No Transportation Needs (10/17/2023)   PRAPARE - Administrator, Civil Service (Medical): No    Lack of Transportation (Non-Medical): No  Physical Activity: Not on file  Stress: Not on file  Social Connections: Not on file    Hospital Course:  During the patient's hospitalization, patient had extensive initial psychiatric evaluation, and follow-up psychiatric evaluations every day.  Psychiatric diagnoses provided upon initial assessment: Methamphetamine induced psychosis, acute psychotic episode and agitation.  Patient's psychiatric medications were  adjusted on admission: Patient refused Depakote but took Zyprexa.  During the hospitalization, other adjustments were made to the patient's psychiatric medication regimen: Zyprexa was titrated to 15 mg at night.  Patient's care was discussed during the interdisciplinary team meeting every day during the hospitalization.  The patient denied having side effects to prescribed psychiatric medication.  Gradually, patient started adjusting to milieu. The patient was evaluated each day by a clinical provider to ascertain response to treatment. Improvement was noted by the patient's report of decreasing symptoms,  improved sleep and appetite, affect, medication tolerance, behavior, and participation in unit programming.  Patient was asked each day to complete a self inventory noting mood, mental status, pain, new symptoms, anxiety and concerns.    Symptoms were reported as significantly decreased or resolved completely by discharge.   On day of discharge, the patient reports that their mood is stable. The patient denied having suicidal thoughts for more than 48 hours prior to discharge.  Patient denies having homicidal thoughts.  Patient denies having auditory hallucinations.  Patient denies any visual hallucinations or other symptoms of psychosis. The patient was motivated to continue taking medication with a goal of continued improvement in mental health.   The patient reports their target psychiatric symptoms of psychosis responded well to the psychiatric medications, and the patient reports overall benefit other psychiatric hospitalization. Supportive psychotherapy was provided to the patient. The patient also participated in regular group therapy while hospitalized. Coping skills, problem solving as well as relaxation therapies were also part of the unit programming.  Initially on admission the patient was noted to be very disorganized tangential labile and agitated.  He was very hyperverbal and difficult to redirect.  Because of agitation patient required agitation protocol he was verbally threatening towards staff several attempts were made to communicate with him but he was not redirectable.  He remained on IVC and was quite disheveled pressured and difficult to manage.  Collateral was obtained from the patient's mother was the petitioner.  She has obtained a restraining order against him.  She reports the patient was growling, hollering and screaming in his bedroom and acted quite bizarre.  He also threatened to kill her.  She believed that he was using some substances and not taking any medications.  He was  also drinking heavily according to the mother. On admission patient required multiple as needed medications but gradually stabilized.  He was then started on gabapentin which took and was also started on olanzapine 5 mg a day with gradually titrated up to 15 mg at night.  His psychosis improved and his agitation improved.  However patient remained mildly labile and intrusive.  He was also noted to be somewhat manipulative on the unit.  He is aware that he has a restraining order against him and he also has 2 legal charges pending 1 of which was postponed because he was in the hospital. After stabilization patient wished to remain on the Zyprexa but refused any Depakote because he did not feel well on the Depakote.  He agreed to follow up as an outpatient with a psychiatrist.  Labs were reviewed with the patient, and abnormal results were discussed with the patient.  The patient is able to verbalize their individual safety plan to this provider.  # It is recommended to the patient to continue psychiatric medications as prescribed, after discharge from the hospital.    # It is recommended to the patient to follow up with your outpatient psychiatric provider and PCP.  #  It was discussed with the patient, the impact of alcohol, drugs, tobacco have been there overall psychiatric and medical wellbeing, and total abstinence from substance use was recommended the patient.ed.  # Prescriptions provided or sent directly to preferred pharmacy at discharge. Patient agreeable to plan. Given opportunity to ask questions. Appears to feel comfortable with discharge.    # In the event of worsening symptoms, the patient is instructed to call the crisis hotline, 911 and or go to the nearest ED for appropriate evaluation and treatment of symptoms. To follow-up with primary care provider for other medical issues, concerns and or health care needs  # Patient was discharged to a shelter with a plan to follow up as noted  below.   Physical Findings: AIMS:  , ,  ,  ,    CIWA:  CIWA-Ar Total: 1 COWS:     Musculoskeletal: Strength & Muscle Tone: within normal limits Gait & Station: normal Patient leans: N/A   Psychiatric Specialty Exam:  Presentation  General Appearance:  Appropriate for Environment  Eye Contact: Fair  Speech: Clear and Coherent  Speech Volume: Normal  Handedness: Right   Mood and Affect  Mood: Euthymic  Affect: Appropriate   Thought Process  Thought Processes: Coherent  Descriptions of Associations:Intact  Orientation:Full (Time, Place and Person)  Thought Content:Logical  History of Schizophrenia/Schizoaffective disorder:No data recorded Duration of Psychotic Symptoms:N/A  Hallucinations:Hallucinations: None  Ideas of Reference:None  Suicidal Thoughts:Suicidal Thoughts: No  Homicidal Thoughts:Homicidal Thoughts: No   Sensorium  Memory: Immediate Fair; Recent Fair; Remote Fair  Judgment: Fair  Insight: Fair   Art therapist  Concentration: Fair  Attention Span: Fair  Recall: [employer] of Knowledge: Fair  Language: Fair   Psychomotor Activity  Psychomotor Activity: Psychomotor Activity: Normal   Assets  Assets: Communication Skills; Desire for Improvement; Resilience   Sleep  Sleep: Sleep: Good Number of Hours of Sleep: 7.75    Physical Exam: Physical Exam Vitals and nursing note reviewed.  Constitutional:      Appearance: Normal appearance.  Neurological:     General: No focal deficit present.     Mental Status: He is alert and oriented to person, place, and time. Mental status is at baseline.  Psychiatric:        Mood and Affect: Mood normal.        Behavior: Behavior normal.        Thought Content: Thought content normal.        Judgment: Judgment normal.    Review of Systems  Psychiatric/Behavioral: Negative.    All other systems reviewed and are negative.  Blood pressure (!) 123/91,  pulse 81, temperature 97.9 F (36.6 C), temperature source Oral, resp. rate 16, height 5\' 6"  (1.676 m), weight 114.1 kg, SpO2 98%. Body mass index is 40.61 kg/m.   Social History   Tobacco Use  Smoking Status Every Day   Current packs/day: 0.25   Types: Cigarettes  Smokeless Tobacco Never   Tobacco Cessation:  A prescription for an FDA-approved tobacco cessation medication provided at discharge   Blood Alcohol level:  Lab Results  Component Value Date   [facility] <10 10/14/2023   ETH <10 10/07/2023    Metabolic Disorder Labs:  Lab Results  Component Value Date   HGBA1C 5.3 10/22/2023   MPG 105.41 10/22/2023   MPG 99.67 02/11/2022   No results found for: "PROLACTIN" Lab Results  Component Value Date   CHOL 146 10/22/2023   TRIG 133 10/22/2023   HDL  48 10/22/2023   CHOLHDL 3.0 10/22/2023   VLDL 27 10/22/2023   LDLCALC 71 10/22/2023   LDLCALC 102 (H) 02/11/2022    See Psychiatric Specialty Exam and Suicide Risk Assessment completed by Attending Physician prior to discharge.  Discharge destination:  [facility]  Is patient on multiple antipsychotic therapies at discharge:  No   Has Patient had three or more failed trials of antipsychotic monotherapy by history:  No  Recommended Plan for Multiple Antipsychotic Therapies: NA  Discharge Instructions     Diet - low sodium heart healthy   Complete by: As directed    Increase activity slowly   Complete by: As directed       Allergies as of 10/23/2023       Reactions   Peanut-containing Drug Products Diarrhea, Anaphylaxis, Swelling   Cheese Diarrhea   Egg-derived Products Diarrhea        Medication List     STOP taking these medications    acetaminophen 500 MG tablet Commonly known as: TYLENOL   divalproex 500 MG DR tablet Commonly known as: DEPAKOTE   FLUoxetine 20 MG capsule Commonly known as: PROZAC   ibuprofen 200 MG tablet Commonly known as: ADVIL   oxyCODONE 5 MG immediate release tablet Commonly  known as: Oxy IR/ROXICODONE       TAKE these medications      Indication  gabapentin 300 MG capsule Commonly known as: NEURONTIN Take 1 capsule (300 mg total) by mouth 3 (three) times daily.  Indication: Abuse or Misuse of Alcohol   hydrochlorothiazide 12.5 MG tablet Commonly known as: HYDRODIURIL Take 1 tablet (12.5 mg total) by mouth daily for 2 days. What changed: Another medication with the same name was removed. Continue taking this medication, and follow the directions you see here.    hydrOXYzine 50 MG tablet Commonly known as: ATARAX Take 1 tablet (50 mg total) by mouth 3 (three) times daily as needed for anxiety. What changed: when to take this  Indication: Feeling Anxious   miconazole 2 % powder Commonly known as: MICOTIN Apply topically. APPLY MODERATE AMOUNT TOPICALLY TWO TIMES A DAY FOR ATHLETE'S FOOT  Indication: Ringworm of Groin Area   naloxone 4 MG/0.1ML Liqd nasal spray kit Commonly known as: NARCAN Place 1 spray into the nose once. SPRAY 1 SPRAY INTO NOSE AS NEEDED FOR OPIOID OVERDOSE **FOR EMERGENCY USE ONLY!** IN CASE OF EMERGENCY  Indication: Opioid Overdose   nicotine 14 mg/24hr patch Commonly known as: NICODERM CQ - dosed in mg/24 hours Place 14 mg onto the skin daily.  Indication: Nicotine Addiction   OLANZapine 15 MG tablet Commonly known as: ZYPREXA Take 1 tablet (15 mg total) by mouth at bedtime. What changed:  medication strength how much to take  Indication: MIXED BIPOLAR AFFECTIVE DISORDER   pantoprazole 40 MG tablet Commonly known as: PROTONIX Take 2 tablets (80 mg total) by mouth daily.  Indication: Gastroesophageal Reflux Disease   traZODone 50 MG tablet Commonly known as: DESYREL Take 1 tablet (50 mg total) by mouth at bedtime as needed for sleep. What changed:  medication strength how much to take  Indication: Trouble Sleeping        Follow-up Information     [facility] Follow up on 10/29/2023.   Why:  You have a nurse phone call appointment on 10/29/23 at 11:00 am, this will be Virtual telehealth.    You have a hospital follow up appointment on 11/05/23 at 10:30 am, in person. Contact information: [address]  [place] [address] [phone]                 Follow-up recommendations:  Activity:  As tolerated.  Comments: The patient is being discharged today and he denies any psychosis and he denies any active suicidal or homicidal ideations.  He is aware that he has a restraining order against him by his mother and that he cannot go to her house.  His plan is to get his belongings.  His mother had agreed to drop off his belongings in the hospital.  Alternatively he will go to the police station and get an escort to pick up his belongings.  He then plans to go to the [facility] and a shelter.  He agrees to take the Zyprexa as prescribed.  He also agrees to outpatient follow-up appointment.    The prognosis is extremely guarded given the fact that the patient clearly indicates that he is not going to stop using alcohol and that he will continue to use marijuana because it calms him down.  He states that he will attempt to try to stop using methamphetamine because he does not desire.  However he feels that alcohol and marijuana help him and he has no intention to stop using the drugs.  He refuses to go to a rehab facility.  He has a court hearing today at 1:30 PM.  He will be discharged today and is encouraged to go to the hearing if he can make it in time.  The patient has maximized improvement in an acute inpatient facility and is being stepped down to outpatient care with a strong recommendation to take his medications and to abstain from all alcohol and drugs.  However he clearly indicates that he has no intention to stop using alcohol or marijuana.  He currently does not meet the criteria for an IVC and he is currently not psychotic.  He understands the consequences of his  actions and that he cannot go back home to his mother.  Signed: Rex Kras, MD 10/23/2023, 9:47 AM

## 2023-10-23 NOTE — Progress Notes (Signed)
  [facility] Adult Case Management Discharge Plan :  Will you be returning to the same living situation after discharge:  No.Patient requested to go to the [facility] shelter  At discharge, do you have transportation home?: Yes,  CSW has arranged a Taxi at10:45 AM for patient to get DC to Shelter  Do you have the ability to pay for your medications: Yes,  Patient has Physicist, medical / [employer]   Release of information consent forms completed and in the chart;  Patient's signature needed at discharge.  Patient to Follow up at:  Follow-up Information     [facility] 10[facility]29/2023.   Why: You have a nurse phone call appointment on 10/29/23 at 11:00 am, this will be Virtual telehealth.    You have a hospital follow up appointment on 11/05/23 at 10:30 am, in person. Contact information: [address] [facility] [place] [address] [phone]                 Next level of care provider has access to [facility] Link:no  Safety Planning and Suicide Prevention discussed: Yes,  Jeremy Frank [phone]     Has patient been referred to the Quitline?: Patient refused referral for treatment  Patient has been referred for addiction treatment: Patient refused referral for treatment. CSW did refer patient to [facility] with [facility] regarding other residential options since he stated that the one he was at , they may not allow for him to come back since he did not finish the program.   Jeremy Frank, LCSWA 10/23/2023, 9:48 AM

## 2023-10-23 NOTE — Plan of Care (Signed)
  Problem: Education: Goal: Knowledge of [facility] Education information/materials will improve Outcome: Progressing   Problem: Activity: Goal: Interest or engagement in activities will improve Outcome: Progressing   Problem: Physical Regulation: Goal: Ability to maintain clinical measurements within normal limits will improve Outcome: Progressing   Problem: Safety: Goal: Periods of time without injury will increase Outcome: Progressing   Problem: Activity: Goal: Sleeping patterns will improve Outcome: Progressing

## 2023-10-23 NOTE — Progress Notes (Signed)
Pt discharge at this time. Pt left facility via taxi. Pt removed all belongings and verbalized understanding of medications and discharge instructions. Pt denies SI/HI/AVH.

## 2023-10-23 NOTE — Progress Notes (Signed)
[facility] LCSW Progress Note   10/23/2023    9:43 AM  Jeremy Frank      Type of Note: Visit With Patient about DC   CSW provided patient with information for his court hearing today at 1:30PM and asked him if he wanted to wait till after lunch to be DC to the court house for hearing and patient declined stating, " I just want to leave now to go to the [facility] and then I will take care of everything else". CSW did reiterate to patient about his mom having the restraining order and patient stated, " I will get call to get a police escort to go to her house and get my things and then I will be gone ". CSW will continue to assist.     Signed:   Jacob Moores, MSW, [facility] 10/23/2023 9:43 AM

## 2023-10-23 NOTE — Transportation (Signed)
10/23/2023  Jeremy Frank DOB: 1986-09-20 MRN: [id number]   RIDER WAIVER AND RELEASE OF LIABILITY  For the purposes of helping with transportation needs, [facility] partners with outside transportation providers (taxi companies, [place], Catering manager.) to give [employer] patients or other approved people the choice of on-demand rides [employer]") to our buildings for non-emergency visits.  By using [employer], I, the person signing this document, on behalf of myself and/or any legal minors (in my care using the [employer]), agree:  Science writer given to me are supplied by independent, outside transportation providers who do not work for, or have any affiliation with, [employer]. [place] is not a transportation company. [place] has no control over the quality or safety of the rides I get using [employer]. [place] has no control over whether any outside ride will happen on time or not. [place] gives no guarantee on the reliability, quality, safety, or availability on any rides, or that no mistakes will happen. I know and accept that traveling by vehicle (car, truck, SVU, Zenaida Niece, bus, taxi, etc.) has risks of serious injuries such as disability, being paralyzed, and death. I know and agree the risk of using [employer] is mine alone, and not [employer]. Transport Services are provided "as is" and as are available. The transportation providers are in charge for all inspections and care of the vehicles used to provide these rides. I agree not to take legal action against [place], its agents, employees, officers, directors, representatives, insurers, attorneys, assigns, successors, subsidiaries, and affiliates at any time for any reasons related directly or indirectly to using [employer]. I also agree not to take legal action against [place] or its affiliates for any injury, death, or damage to property caused by or related to using  [employer]. I have read this Waiver and Release of Liability, and I understand the terms used in it and their legal meaning. This Waiver is freely and voluntarily given with the understanding that my right (or any legal minors) to legal action against [place] relating to [employer] is knowingly given up to use these services.   I attest that I read the Ride Waiver and Release of Liability to Jeremy Frank, gave Mr. Harpenau the opportunity to ask questions and answered the questions asked (if any). I affirm that Jeremy Frank then provided consent for assistance with transportation.

## 2023-10-23 NOTE — [facility] Suicide Risk Assessment (Signed)
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[facility]) Discharge Diagnoses: Principal Problem:   Methamphetamine-induced psychotic disorder (HCC)   Total Time spent with patient: 30 minutes  Musculoskeletal: Strength & Muscle Tone: within normal limits Gait & Station: normal Patient leans: N/A  Psychiatric Specialty Exam  Presentation  General Appearance:  Appropriate for Environment  Eye Contact: Fair  Speech: Clear and Coherent  Speech Volume: Normal  Handedness: Right   Mood and Affect  Mood: Euthymic  Duration of Depression Symptoms: No data recorded Affect: Appropriate   Thought Process  Thought Processes: Coherent  Descriptions of Associations:Intact  Orientation:Full (Time, Place and Person)  Thought Content:Perseveration  History of Schizophrenia/Schizoaffective disorder:No data recorded Duration of Psychotic Symptoms:N/A  Hallucinations:No data recorded Ideas of Reference:Delusions  Suicidal Thoughts:Suicidal Thoughts: No  Homicidal Thoughts:Homicidal Thoughts: No   Sensorium  Memory: Immediate Fair; Remote Fair; Recent Fair  Judgment: Fair  Insight: Fair   Art therapist  Concentration: Fair  Attention Span: Fair  Recall: [employer] of Knowledge: Fair  Language: Fair   Psychomotor Activity  Psychomotor Activity: Psychomotor Activity: Normal   Assets  Assets: Communication Skills; Desire for Improvement   Sleep  Sleep: Sleep: Good Number of Hours of Sleep: 8.75   Physical Exam: Physical Exam Constitutional:      Appearance: Normal appearance.  Neurological:     General: No focal deficit present.     Mental Status: He is alert and oriented to person, place, and time. Mental status is at baseline.  Psychiatric:        Mood and Affect: Mood normal.        Behavior: Behavior normal.        Thought Content: Thought content normal.     Review of Systems  Psychiatric/Behavioral: Negative.    All other systems reviewed and are negative.  Blood pressure (!) 123/91, pulse 81, temperature 97.9 F (36.6 C), temperature source Oral, resp. rate 16, height 5\' 6"  (1.676 m), weight 114.1 kg, SpO2 98%. Body mass index is 40.61 kg/m.  Mental Status Per Nursing Assessment::   On Admission:  NA  Demographic Factors:  Male, Low socioeconomic status, and Unemployed  Loss Factors: Legal issues  Historical Factors: Impulsivity  Risk Reduction Factors:   NA  Continued Clinical Symptoms:  Alcohol/Substance Abuse/Dependencies Previous Psychiatric Diagnoses and Treatments  Cognitive Features That Contribute To Risk:  Thought constriction (tunnel vision)    Suicide Risk:  Mild:  Suicidal ideation of limited frequency, intensity, duration, and specificity.  There are no identifiable plans, no associated intent, mild dysphoria and related symptoms, good self-control (both objective and subjective assessment), few other risk factors, and identifiable protective factors, including available and accessible social support.   Follow-up Information     [facility] 10[facility]/2023.   Why: You have a nurse phone call appointment on 10/29/23 at 11:00 am, this will be Virtual telehealth.    You have a hospital follow up appointment on 11/05/23 at 10:30 am, in person. Contact information: [address] [facility] [place] [address] [phone]                 Plan Of Care/Follow-up recommendations:  Activity:  As tolerated  Rex Kras, MD 10/23/2023, 9:37 AM

## 2023-10-23 NOTE — Progress Notes (Signed)
   10/23/23 0644  15 Minute Checks  Location Bedroom  Visual Appearance Calm  Behavior Composed  Sleep (Behavioral Health Patients Only)  Calculate sleep? (Click Yes once per 24 hr at 0600 safety check) Yes  Documented sleep last 24 hours 7.75

## 2023-10-25 ENCOUNTER — Emergency Department
Admission: EM | Admit: 2023-10-25 | Discharge: 2023-10-25 | Disposition: A | Discharge status: Home / Self Care | Payer: No Typology Code available for payment source | Attending: Emergency Medicine | Admitting: Emergency Medicine

## 2023-10-25 ENCOUNTER — Other Ambulatory Visit: Payer: Self-pay

## 2023-10-25 ENCOUNTER — Emergency Department: Payer: No Typology Code available for payment source

## 2023-10-25 DIAGNOSIS — F419 Anxiety disorder, unspecified: Secondary | ICD-10-CM | POA: Diagnosis not present

## 2023-10-25 DIAGNOSIS — F1510 Other stimulant abuse, uncomplicated: Secondary | ICD-10-CM | POA: Insufficient documentation

## 2023-10-25 DIAGNOSIS — R079 Chest pain, unspecified: Secondary | ICD-10-CM | POA: Diagnosis not present

## 2023-10-25 DIAGNOSIS — R42 Dizziness and giddiness: Secondary | ICD-10-CM | POA: Insufficient documentation

## 2023-10-25 LAB — BASIC METABOLIC PANEL
Anion gap: 13 (ref 5–15)
BUN: 20 mg/dL (ref 6–20)
CO2: 21 mmol/L — ABNORMAL LOW (ref 22–32)
Calcium: 8.8 mg/dL — ABNORMAL LOW (ref 8.9–10.3)
Chloride: 103 mmol/L (ref 98–111)
Creatinine, Ser: 1.47 mg/dL — ABNORMAL HIGH (ref 0.61–1.24)
GFR, Estimated: >60 mL/min (ref >60)
Glucose, Bld: 97 mg/dL (ref 70–99)
Potassium: 3.1 mmol/L — ABNORMAL LOW (ref 3.5–5.1)
Sodium: 137 mmol/L (ref 135–145)

## 2023-10-25 LAB — CBC
HCT: 41.4 % (ref 39.0–52.0)
Hemoglobin: 13.7 g/dL (ref 13.0–17.0)
MCH: 29.6 pg (ref 26.0–34.0)
MCHC: 33.1 g/dL (ref 30.0–36.0)
MCV: 89.4 fL (ref 80.0–100.0)
Platelets: 240 10*3/uL (ref 150–400)
RBC: 4.63 MIL/uL (ref 4.22–5.81)
RDW: 13.2 % (ref 11.5–15.5)
WBC: 15.9 10*3/uL — ABNORMAL HIGH (ref 4.0–10.5)
nRBC: 0 % (ref 0.0–0.2)

## 2023-10-25 LAB — TROPONIN I (HIGH SENSITIVITY): Troponin I (High Sensitivity): 6 ng/L (ref <18)

## 2023-10-25 MED ORDER — LORAZEPAM 2 MG/ML IJ SOLN
1.0000 mg | Freq: Once | INTRAMUSCULAR | Status: AC
  Administered 2023-10-25: 1 mg via INTRAVENOUS
  Filled 2023-10-25: qty 1

## 2023-10-25 NOTE — ED Triage Notes (Signed)
Pt arrives via EMS [facility]. Pt used meth about 1 hour and 30 minutes ago. He walked to the fire station because he wasn't feeling well. EMS states patient's HR was in the 190s when he arrived at the fire station. Pt c/o chest pain and sob.

## 2023-10-25 NOTE — ED Notes (Signed)
MD Alfred Levins in with patient for dispo consultation and patient not wanting to speak with psych stating he will be fine to be discharged although he is homeless. Patient steady, speaking in full sentences with a GCS of 15. Patient has packed his bag, dressed, and ambulating in room.

## 2023-10-25 NOTE — ED Notes (Signed)
Patient pulled off cardiac leads, pulse ox, and blood pressure cuff x2. This RN advised not to do so in order to monitor heart rhythm. Patient appears to be hallucinating, making gestures, and speaking to the wall.

## 2023-10-25 NOTE — ED Notes (Signed)
Xray tech with portable at patient bedside.

## 2023-10-25 NOTE — ED Notes (Signed)
Patient up and removed all monitoring equipment. MD Alfred Levins consulted on patient DISPO. Patient informed he does not have a ride but is planning to walk after discharge.

## 2023-10-25 NOTE — ED Provider Notes (Signed)
[facility] Provider Note   Event Date/Time   First MD Initiated Contact with Patient 10/25/23 1253     (approximate) History  Drug Overdose and Chest Pain  HPI Jeremy Frank is a 37 y.o. male with a stated past medical history of methamphetamine abuse with methamphetamine abuse just prior to arrival who presents via EMS after complaining of chest pain, lightheadedness, and anxiety after using methamphetamines.  Patient states that "I do not feel like I normally do".  Patient denies any chest pain radiating through to the back or up the neck or arm.  Patient denies any worsening chest pain on exertion.  Patient is mildly agitated and is very anxious about being in the hospital. ROS: Patient currently denies any vision changes, tinnitus, difficulty speaking, facial droop, sore throat, shortness of breath, abdominal pain, nausea/vomiting/diarrhea, dysuria, or weakness/numbness/paresthesias in any extremity   Physical Exam  Triage Vital Signs: ED Triage Vitals  Encounter Vitals Group     BP 10/25/23 1256 (!) 152/96     Systolic BP Percentile --      Diastolic BP Percentile --      Pulse Rate 10/25/23 1256 (!) 117     Resp 10/25/23 1256 18     Temp 10/25/23 1257 98.5 F (36.9 C)     Temp src --      SpO2 10/25/23 1250 98 %     Weight --      Height --      Head Circumference --      Peak Flow --      Pain Score 10/25/23 1256 10     Pain Loc --      Pain Education --      Exclude from Growth Chart --    Most recent vital signs: Vitals:   10/25/23 1257 10/25/23 1340  BP:  (!) 171/107  Pulse:  (!) 112  Resp:  (!) 23  Temp: 98.5 F (36.9 C)   SpO2:  98%   General: Awake, oriented x4. CV:  Good peripheral perfusion.  Resp:  Normal effort.  Abd:  No distention.  Other:  Middle-aged overweight African-American male resting comfortably in no acute distress ED Results / Procedures / Treatments  Labs (all labs ordered are listed, but only abnormal  results are displayed) Labs Reviewed  BASIC METABOLIC PANEL - Abnormal; Notable for the following components:      Result Value   Potassium 3.1 (*)    CO2 21 (*)    Creatinine, Ser 1.47 (*)    Calcium 8.8 (*)    All other components within normal limits  CBC - Abnormal; Notable for the following components:   WBC 15.9 (*)    All other components within normal limits  TROPONIN I (HIGH SENSITIVITY)   EKG ED ECG REPORT I, Merwyn Katos, the attending physician, personally viewed and interpreted this ECG. Date: 10/25/2023 EKG Time: 1256 Rate: 116 Rhythm: Tachycardic sinus rhythm QRS Axis: normal Intervals: normal ST/T Wave abnormalities: normal Narrative Interpretation: Tachycardic sinus rhythm no evidence of acute ischemia RADIOLOGY ED MD interpretation: One-view portable chest x-ray interpreted by me shows no evidence of acute abnormalities including no pneumonia, pneumothorax, or widened mediastinum -Agree with radiology assessment Official radiology report(s): DG Chest 1 View  Result Date: 10/25/2023 CLINICAL DATA:  Shortness of breath.  Chest pain. EXAM: CHEST  1 VIEW COMPARISON:  One-view chest x-ray 09/20/2023 FINDINGS: The heart size is normal. Lung volumes are low, exaggerating the pulmonary  hila. No focal airspace disease is present. No edema or effusion is present. IMPRESSION: 1. Low lung volumes. 2. No acute cardiopulmonary disease. Electronically Signed   By: Marin Roberts M.D.   On: 10/25/2023 13:30   PROCEDURES: Critical Care performed: No .1-3 Lead EKG Interpretation  Performed by: Merwyn Katos, MD Authorized by: Merwyn Katos, MD     Interpretation: normal     ECG rate:  91   ECG rate assessment: normal     Rhythm: sinus rhythm     Ectopy: none     Conduction: normal    MEDICATIONS ORDERED IN ED: Medications  LORazepam (ATIVAN) injection 1 mg (1 mg Intravenous Given 10/25/23 1423)   IMPRESSION / MDM / ASSESSMENT AND PLAN / ED COURSE  I  reviewed the triage vital signs and the nursing notes.                             The patient is on the cardiac monitor to evaluate for evidence of arrhythmia and/or significant heart rate changes. Patient's presentation is most consistent with acute presentation with potential threat to life or bodily function. Patient complains after methamphetamine ingestion of chest pain and lightheadedness. Given History and Exam I have low suspicion for toxic ingestion, anemia, hypothyroidism, infection, or ICH as a cause for this presentation. Interventions: Lorazepam 2mg  IM + Haldol 5mg  IM as needed Behavior likely secondary to methamphetamine abuse.  Troponin negative, EKG only shows sinus tachycardia Plan for metabolization of ingestion and reassessment Disposition: Care of this patient will be signed out to the oncoming physician at the end of my shift.  All pertinent patient information conveyed and all questions answered.  All further care and disposition decisions will be made by the oncoming physician.   FINAL CLINICAL IMPRESSION(S) / ED DIAGNOSES   Final diagnoses:  Methamphetamine abuse (HCC)   Rx / DC Orders   ED Discharge Orders     None      Note:  This document was prepared using Dragon voice recognition software and may include unintentional dictation errors.   Merwyn Katos, MD 10/25/23 [phone]

## 2023-10-25 NOTE — ED Provider Notes (Signed)
9:11 PM Assumed care for off going team.   Blood pressure 118/76, pulse 90, temperature 98.3 F (36.8 C), temperature source Oral, resp. rate 16, SpO2 97%.  See their HPI for full report but in brief sober re-eval    Reevaluated patient he denies any SI.  He states that he is ready for discharge home.  I did review the records were 2 days ago he was discharged from the psychiatric hospital for schizophrenia but also concern for substance use. He does report using substances today which I suspect caused his symptoms earlier today.  He states that he has his medications for schizophrenia denies any SI, HI, auditory visualizations.  At this time patient is alert and oriented x 4 he is answering questions appropriately.  He is walking with a steady gait.  Does not appear danger to himself or to others.  I offered him to stay here overnight to see psychiatry voluntary but patient declined.  He states that he would like to be discharged and at this time he does not meet IVC criteria so patient will be discharged.       Concha Se, MD 10/25/23 2112

## 2023-10-25 NOTE — ED Notes (Signed)
This RN reconnected patient to cardiac monitor, BP, and pulse ox.

## 2023-10-25 NOTE — Discharge Instructions (Signed)
Avoid substances and return to the ER if you develop worsening symptoms or any other concerns

## 2023-10-25 NOTE — ED Notes (Signed)
ED Provider at bedside. 

## 2024-02-11 ENCOUNTER — Emergency Department
Admission: EM | Admit: 2024-02-11 | Discharge: 2024-02-11 | Disposition: A | Discharge status: Home / Self Care | Payer: No Typology Code available for payment source | Attending: Emergency Medicine | Admitting: Emergency Medicine

## 2024-02-11 ENCOUNTER — Emergency Department: Payer: No Typology Code available for payment source

## 2024-02-11 ENCOUNTER — Other Ambulatory Visit: Payer: Self-pay

## 2024-02-11 DIAGNOSIS — I10 Essential (primary) hypertension: Secondary | ICD-10-CM

## 2024-02-11 DIAGNOSIS — I160 Hypertensive urgency: Secondary | ICD-10-CM | POA: Diagnosis not present

## 2024-02-11 DIAGNOSIS — R519 Headache, unspecified: Secondary | ICD-10-CM | POA: Diagnosis present

## 2024-02-11 LAB — CBC
HCT: 43.5 % (ref 39.0–52.0)
Hemoglobin: 14.4 g/dL (ref 13.0–17.0)
MCH: 28.4 pg (ref 26.0–34.0)
MCHC: 33.1 g/dL (ref 30.0–36.0)
MCV: 85.8 fL (ref 80.0–100.0)
Platelets: 273 10*3/uL (ref 150–400)
RBC: 5.07 MIL/uL (ref 4.22–5.81)
RDW: 13.3 % (ref 11.5–15.5)
WBC: 11.7 10*3/uL — ABNORMAL HIGH (ref 4.0–10.5)
nRBC: 0 % (ref 0.0–0.2)

## 2024-02-11 LAB — BASIC METABOLIC PANEL
Anion gap: 11 (ref 5–15)
BUN: 13 mg/dL (ref 6–20)
CO2: 26 mmol/L (ref 22–32)
Calcium: 9.5 mg/dL (ref 8.9–10.3)
Chloride: 99 mmol/L (ref 98–111)
Creatinine, Ser: 1.35 mg/dL — ABNORMAL HIGH (ref 0.61–1.24)
GFR, Estimated: >60 mL/min (ref >60)
Glucose, Bld: 115 mg/dL — ABNORMAL HIGH (ref 70–99)
Potassium: 3.4 mmol/L — ABNORMAL LOW (ref 3.5–5.1)
Sodium: 136 mmol/L (ref 135–145)

## 2024-02-11 LAB — TROPONIN I (HIGH SENSITIVITY)
Troponin I (High Sensitivity): 8 ng/L (ref <18)
Troponin I (High Sensitivity): 6 ng/L (ref <18)

## 2024-02-11 MED ORDER — AMLODIPINE BESYLATE 5 MG PO TABS: 5.0000 mg | ORAL_TABLET | Freq: Once | ORAL | Status: DC

## 2024-02-11 MED ORDER — AMLODIPINE BESYLATE 5 MG PO TABS
7.5000 mg | ORAL_TABLET | Freq: Once | ORAL | Status: AC
  Administered 2024-02-11: 7.5 mg via ORAL
  Filled 2024-02-11: qty 2

## 2024-02-11 MED ORDER — AMLODIPINE BESYLATE 5 MG PO TABS: 7.5000 mg | ORAL_TABLET | Freq: Every day | ORAL | 0 refills | Status: DC

## 2024-02-11 NOTE — ED Provider Notes (Signed)
-----------------------------------------   7:16 AM on 02/11/2024 -----------------------------------------  Blood pressure (!) 160/108, pulse 99, temperature 98 F (36.7 C), resp. rate 16, height 5\' 11"  (1.803 m), weight 108.9 kg, SpO2 99%.  Assuming care from Dr. Fanny Bien.  In short, Jeremy Frank is a 38 y.o. male with a chief complaint of Chest Pain .  Refer to the original H&P for additional details.  The current plan of care is to follow-up repeat troponin.  ----------------------------------------- 7:41 AM on 02/11/2024 ----------------------------------------- Repeat troponin stable and with reassuring workup patient is appropriate for discharge home with outpatient follow-up.  He was prescribed amlodipine per Dr. Fanny Bien, counseled to follow-up with his PCP and to return to the ED for new or worsening symptoms.  Patient agrees with plan.       Chesley Noon, MD 02/11/24 [phone]

## 2024-02-11 NOTE — ED Triage Notes (Signed)
Pt reports chest pain x2 hours, pt reports heaviness. Pt denies accompanying symptoms. Pt repotrs hx HTN

## 2024-02-11 NOTE — ED Provider Notes (Signed)
[facility] Provider Note    Event Date/Time   First MD Initiated Contact with Patient 02/11/24 [phone]     (approximate)   History   Chest Pain   HPI  Jeremy Frank is a 38 y.o. male with a history of polysubstance abuse Crohn's disease   No noted history of coronary disease.  Patient denies history of cardiac disease   Patient reports that for couple weeks now has had throbbing headache and chest tightness.  He went to the [place] yesterday and was seen at the ER.  He reports that he is currently taking hydrochlorothiazide and has recently started amlodipine 5 mg which he last took yesterday.  He has occasionally throbbing headaches and will develop a slight sense of chest tightness.  No fevers no nausea or vomiting.  He is also receiving psychiatric care through the [place] and struggles with substance abuse and reports that he relapsed on methamphetamine 2 days ago but is only used once.  He has a plan to receiving continue services through the [place]  He has no history of heart problems..  No leg swelling.  No difficulty breathing  Physical Exam   Triage Vital Signs: ED Triage Vitals  Encounter Vitals Group     BP 02/11/24 0156 (!) 186/129     Systolic BP Percentile --      Diastolic BP Percentile --      Pulse Rate 02/11/24 0156 (!) 116     Resp 02/11/24 0156 20     Temp 02/11/24 0156 98.3 F (36.8 C)     Temp Source 02/11/24 0156 Oral     SpO2 02/11/24 0156 98 %     Weight 02/11/24 0155 240 lb (108.9 kg)     Height 02/11/24 0155 5\' 11"  (1.803 m)     Head Circumference --      Peak Flow --      Pain Score 02/11/24 0155 8     Pain Loc --      Pain Education --      Exclude from Growth Chart --     Most recent vital signs: Vitals:   02/11/24 0156 02/11/24 0649  BP: (!) 186/129 (!) 160/108  Pulse: (!) 116 99  Resp: 20 16  Temp: 98.3 F (36.8 C) 98 F (36.7 C)  SpO2: 98% 99%     General: Awake, no distress.  CV:  Good peripheral perfusion.   Very mild tachycardia Resp:  Normal effort.  Clear lungs bilaterally.  Weak and focal sentences Abd:  No distention.  Soft nontender nondistended Other:  Lower extremity edema.  Stands walks shows me his medication bag without difficulty, he is well oriented in no distress.  He does appear slightly anxious reports he is worried about his blood pressure.  Clear speech.  Facial expressions are normal.  Extraocular movements are normal.  No focal deficits noted   ED Results / Procedures / Treatments   Labs (all labs ordered are listed, but only abnormal results are displayed) Labs Reviewed  BASIC METABOLIC PANEL - Abnormal; Notable for the following components:      Result Value   Potassium 3.4 (*)    Glucose, Bld 115 (*)    Creatinine, Ser 1.35 (*)    All other components within normal limits  CBC - Abnormal; Notable for the following components:   WBC 11.7 (*)    All other components within normal limits  TROPONIN I (HIGH SENSITIVITY)  TROPONIN I (HIGH  SENSITIVITY)     EKG  He interpreted by me at 205 heart rate 110 QRS 90 QTc 450 Slight artifact.  Sinus tachycardia.  Mild nonspecific T wave abnormality may be artifactual although T wave depression is felt to be present in lead III and flattening lateral precordial.  No STEMI  Repeat ECG and interpreted by me at 540 heart rate 100 QRS 85 QTc 470 Normal sinus rhythm mild repolarization abnormality noted.  No evidence of acute ischemia   RADIOLOGY  Chest x-ray interpreted by me as negative for acute finding      PROCEDURES:  Critical Care performed: No  Procedures   MEDICATIONS ORDERED IN ED: Medications  amLODipine (NORVASC) tablet 7.5 mg (7.5 mg Oral Given 02/11/24 0537)     IMPRESSION / MDM / ASSESSMENT AND PLAN / ED COURSE  I reviewed the triage vital signs and the nursing notes.                              Differential diagnosis includes, but is not limited to, hypertensive urgency, polysubstance abuse,  sympathomimetic use which patient freely admits to methamphetamine use etc.  There are multiple factors in his presentation and the recent use of methamphetamine could certainly be triggering some of his symptoms of elevated blood pressure but also he has been recently treated and started on amlodipine and hydrochlorothiazide.  He reports a history of persistently high blood pressures and has had very similar symptoms and went to the [place] ER yesterday.  He currently shows me his medication he is currently taking 5 mg amlodipine daily and hydrochlorothiazide.  His ECG shows no frank ischemia.  Repeat ECG reassuring initial troponin normal.  After receiving amlodipine patient resting, and blood pressure improving most still elevated with both systolic and diastolic hypertension 160/108, but of note again it is somewhat complicated in the setting of recently reported use of amphetamine.  CT of the head without acute finding.  Chest x-ray no evidence of acute emergency pulmonary edema.  Clinical examination does not support acute hypertensive emergency.  No altered mental status no neurologic focal findings, no findings of volume overload or failure.  Patient's presentation is most consistent with acute complicated illness / injury requiring diagnostic workup.  No evidence of thromboembolism.  His symptoms do not include any pleuritic chest pain he has no dyspnea, he is not hypoxic clinical findings would not support diagnosis of thromboembolism nor other secondary findings such as lower extremity edema venous cords or congestion.  ----------------------------------------- 7:17 AM on 02/11/2024 ----------------------------------------- Discussed with the patient he is comfortable the plan to discharge and will adjust his amlodipine to 7.5 milligrams daily with close follow-up with the [place].  He request prescription be sent to his [facility] [place] [facility] which is his typical pharmacy.  He will discontinue use of his  5 mg tablets and switch to his new prescription.  In anticipation of his likely discharge I did discuss return precautions and treatment recommendations and follow-up discussed with the patient who is agreeable with the plan.  Current plan of care is for Dr. Larinda Buttery to follow on the patient's second troponin with disposition thereafter anticipated.     FINAL CLINICAL IMPRESSION(S) / ED DIAGNOSES   Final diagnoses:  Hypertensive urgency     Rx / DC Orders   ED Discharge Orders          Ordered    amLODipine (NORVASC) 5 MG tablet  Daily  02/11/24 0654             Note:  This document was prepared using Dragon voice recognition software and may include unintentional dictation errors.   Sharyn Creamer, MD 02/11/24 [phone]

## 2024-02-12 ENCOUNTER — Emergency Department
Admission: EM | Admit: 2024-02-12 | Discharge: 2024-02-12 | Disposition: A | Discharge status: Home / Self Care | Payer: No Typology Code available for payment source | Attending: Emergency Medicine | Admitting: Emergency Medicine

## 2024-02-12 ENCOUNTER — Other Ambulatory Visit: Payer: Self-pay

## 2024-02-12 ENCOUNTER — Emergency Department: Payer: No Typology Code available for payment source

## 2024-02-12 DIAGNOSIS — F16283 Hallucinogen dependence with hallucinogen persisting perception disorder (flashbacks): Secondary | ICD-10-CM

## 2024-02-12 DIAGNOSIS — F1510 Other stimulant abuse, uncomplicated: Secondary | ICD-10-CM | POA: Insufficient documentation

## 2024-02-12 DIAGNOSIS — F419 Anxiety disorder, unspecified: Secondary | ICD-10-CM | POA: Insufficient documentation

## 2024-02-12 DIAGNOSIS — R45851 Suicidal ideations: Secondary | ICD-10-CM | POA: Diagnosis not present

## 2024-02-12 DIAGNOSIS — F16983 Hallucinogen use, unspecified with hallucinogen persisting perception disorder (flashbacks): Secondary | ICD-10-CM | POA: Diagnosis not present

## 2024-02-12 DIAGNOSIS — F4310 Post-traumatic stress disorder, unspecified: Secondary | ICD-10-CM | POA: Diagnosis present

## 2024-02-12 DIAGNOSIS — F1910 Other psychoactive substance abuse, uncomplicated: Secondary | ICD-10-CM | POA: Insufficient documentation

## 2024-02-12 DIAGNOSIS — F4323 Adjustment disorder with mixed anxiety and depressed mood: Secondary | ICD-10-CM | POA: Insufficient documentation

## 2024-02-12 DIAGNOSIS — F17210 Nicotine dependence, cigarettes, uncomplicated: Secondary | ICD-10-CM | POA: Insufficient documentation

## 2024-02-12 DIAGNOSIS — I10 Essential (primary) hypertension: Secondary | ICD-10-CM | POA: Diagnosis not present

## 2024-02-12 DIAGNOSIS — F1990 Other psychoactive substance use, unspecified, uncomplicated: Secondary | ICD-10-CM

## 2024-02-12 DIAGNOSIS — R0789 Other chest pain: Secondary | ICD-10-CM | POA: Insufficient documentation

## 2024-02-12 LAB — COMPREHENSIVE METABOLIC PANEL
ALT: 30 U/L (ref 0–44)
AST: 56 U/L — ABNORMAL HIGH (ref 15–41)
Albumin: 4.8 g/dL (ref 3.5–5.0)
Alkaline Phosphatase: 77 U/L (ref 38–126)
Anion gap: 15 (ref 5–15)
BUN: 20 mg/dL (ref 6–20)
CO2: 22 mmol/L (ref 22–32)
Calcium: 9.6 mg/dL (ref 8.9–10.3)
Chloride: 100 mmol/L (ref 98–111)
Creatinine, Ser: 1.61 mg/dL — ABNORMAL HIGH (ref 0.61–1.24)
GFR, Estimated: 55 mL/min — ABNORMAL LOW (ref >60)
Glucose, Bld: 141 mg/dL — ABNORMAL HIGH (ref 70–99)
Potassium: 3.7 mmol/L (ref 3.5–5.1)
Sodium: 137 mmol/L (ref 135–145)
Total Bilirubin: 1 mg/dL (ref 0.0–1.2)
Total Protein: 8.7 g/dL — ABNORMAL HIGH (ref 6.5–8.1)

## 2024-02-12 LAB — CBC WITH DIFFERENTIAL/PLATELET
Abs Immature Granulocytes: 0.08 10*3/uL — ABNORMAL HIGH (ref 0.00–0.07)
Basophils Absolute: 0.2 10*3/uL — ABNORMAL HIGH (ref 0.0–0.1)
Basophils Relative: 1 %
Eosinophils Absolute: 0.4 10*3/uL (ref 0.0–0.5)
Eosinophils Relative: 3 %
HCT: 46.1 % (ref 39.0–52.0)
Hemoglobin: 15.3 g/dL (ref 13.0–17.0)
Immature Granulocytes: 1 %
Lymphocytes Relative: 28 %
Lymphs Abs: 4.3 10*3/uL — ABNORMAL HIGH (ref 0.7–4.0)
MCH: 28.5 pg (ref 26.0–34.0)
MCHC: 33.2 g/dL (ref 30.0–36.0)
MCV: 86 fL (ref 80.0–100.0)
Monocytes Absolute: 1.4 10*3/uL — ABNORMAL HIGH (ref 0.1–1.0)
Monocytes Relative: 9 %
Neutro Abs: 9.1 10*3/uL — ABNORMAL HIGH (ref 1.7–7.7)
Neutrophils Relative %: 58 %
Platelets: 286 10*3/uL (ref 150–400)
RBC: 5.36 MIL/uL (ref 4.22–5.81)
RDW: 13.2 % (ref 11.5–15.5)
WBC: 15.4 10*3/uL — ABNORMAL HIGH (ref 4.0–10.5)
nRBC: 0 % (ref 0.0–0.2)

## 2024-02-12 LAB — TROPONIN I (HIGH SENSITIVITY): Troponin I (High Sensitivity): 5 ng/L (ref <18)

## 2024-02-12 LAB — LIPASE, BLOOD: Lipase: 29 U/L (ref 11–51)

## 2024-02-12 LAB — ACETAMINOPHEN LEVEL: Acetaminophen (Tylenol), Serum: <10 ug/mL — ABNORMAL LOW (ref 10–30)

## 2024-02-12 LAB — SALICYLATE LEVEL: Salicylate Lvl: <7 mg/dL — ABNORMAL LOW (ref 7.0–30.0)

## 2024-02-12 LAB — ETHANOL: Alcohol, Ethyl (B): <10 mg/dL (ref <10)

## 2024-02-12 MED ORDER — LORAZEPAM 2 MG/ML IJ SOLN: 1.0000 mg | INTRAMUSCULAR | Status: DC | PRN

## 2024-02-12 MED ORDER — ADULT MULTIVITAMIN W/MINERALS CH
1.0000 | ORAL_TABLET | Freq: Every day | ORAL | Status: DC
  Administered 2024-02-12: 1 via ORAL
  Filled 2024-02-12: qty 1

## 2024-02-12 MED ORDER — LORAZEPAM 1 MG PO TABS: 1.0000 mg | ORAL_TABLET | ORAL | Status: DC | PRN

## 2024-02-12 MED ORDER — THIAMINE HCL 100 MG/ML IJ SOLN: 100.0000 mg | Freq: Every day | INTRAMUSCULAR | Status: DC

## 2024-02-12 MED ORDER — LORAZEPAM 2 MG/ML IJ SOLN
2.0000 mg | Freq: Once | INTRAMUSCULAR | Status: AC
  Administered 2024-02-12: 2 mg via INTRAVENOUS
  Filled 2024-02-12: qty 1

## 2024-02-12 MED ORDER — THIAMINE MONONITRATE 100 MG PO TABS
100.0000 mg | ORAL_TABLET | Freq: Every day | ORAL | Status: DC
  Administered 2024-02-12: 100 mg via ORAL
  Filled 2024-02-12: qty 1

## 2024-02-12 MED ORDER — FOLIC ACID 1 MG PO TABS
1.0000 mg | ORAL_TABLET | Freq: Every day | ORAL | Status: DC
  Administered 2024-02-12: 1 mg via ORAL
  Filled 2024-02-12: qty 1

## 2024-02-12 NOTE — ED Notes (Signed)
VOL/  PENDING  CONSULT

## 2024-02-12 NOTE — ED Notes (Signed)
All belongings returned to patient

## 2024-02-12 NOTE — ED Notes (Signed)
Unable to assess at this time due to patient medical work up

## 2024-02-12 NOTE — BH Assessment (Signed)
Comprehensive Clinical Assessment (CCA) Screening, Triage and Referral Note  02/12/2024 Jeremy Frank [id number]  Chief Complaint:  Chief Complaint  Patient presents with   Suicidal   Visit Diagnosis: Substance Induced Mood Disorder  Jeremy Frank is a 38 year old male who presents to the ER, due to anxiety, paranoia, because his blood pressure was elevated. He also shared he recently used methamphetamine. Per his report, his blood pressure has caused him to be dizzy and was afraid about what may have happed to him. He acknowledges his substance use is contributing to his current symptoms. During the interview, he was calm, cooperative and pleasant. He was able to provide appropriate answers to the questions. He denies SI/HI and AV/H.  Patient Reported Information How did you hear about [place]? Self  What Is the Reason for Your Visit/Call Today? Patient brought to the ER due to anxiety, paranoia, because of his blood pressure was elevated.  How Long Has This Been Causing You Problems? 1 wk - 1 month  What Do You Feel Would Help You the Most Today? Alcohol or Drug Use Treatment; Treatment for Depression or other mood problem   Have You Recently Had Any Thoughts About Hurting Yourself? No  Are You Planning to Commit Suicide/Harm Yourself At This time? No   Have you Recently Had Thoughts About Hurting Someone [name]? No  Are You Planning to Harm Someone at This Time? No  Explanation: n/a   Have You Used Any Alcohol or Drugs in the Past 24 Hours? Yes  How Long Ago Did You Use Drugs or Alcohol? Unable to quantify  What Did You Use and How Much? Methamphetamine   Do You Currently Have a Therapist/Psychiatrist? Yes  Name of Therapist/Psychiatrist: [facility]   Have [place] Been Recently Discharged From Any Public relations account executive or Programs? No  Explanation of Discharge From Practice/Program: n/a    CCA Screening Triage Referral Assessment Type of Contact:  Face-to-Face  Telemedicine Service Delivery:   Is this Initial or Reassessment?   Date Telepsych consult ordered in [facility]:    Time Telepsych consult ordered in [facility]:    Location of Assessment: [facility] ED  Provider Location: [facility]    Collateral Involvement: None provided   Does Patient Have a Court Appointed Legal Guardian? No data recorded Name and Contact of Legal Guardian: No data recorded If Minor and Not Living with Parent(s), Who has Custody? n/a  Is CPS involved or ever been involved? Never  Is APS involved or ever been involved? Never   Patient Determined To Be At Risk for Harm To Self or Others Based on Review of Patient Reported Information or Presenting Complaint? No  Method: No Plan  Availability of Means: No access or NA  Intent: Vague intent or NA  Notification Required: No need or identified person  Additional Information for Danger to Others Potential: -- (n/a)  Additional Comments for Danger to Others Potential: n/a  Are There Guns or Other Weapons in Your Home? No  Types of Guns/Weapons: n/a  Are These Weapons Safely Secured?                            No  Who Could Verify You Are Able To Have These Secured: n/a  Do You Have any Outstanding Charges, Pending Court Dates, Parole/Probation? None reported  Contacted To Inform of Risk of Harm To Self or Others: Other: Comment   Does Patient Present under Involuntary Commitment? No  [place] of Residence: [place]   Patient Currently Receiving the Following Services: Medication Management   Determination of Need: Emergent (2 hours)   Options For Referral: ED Visit   Disposition Recommendation per psychiatric provider: Pending Psych Consult  Lilyan Gilford MS, LCAS, [facility], [facility] Therapeutic Triage Specialist 02/12/2024 1:10 PM
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
## 2024-02-12 NOTE — ED Notes (Signed)
Pt completed psych tele-assessment

## 2024-02-12 NOTE — ED Notes (Signed)
Hospital meal provided.  100% consumed, pt tolerated w/o complaints.  Waste discarded appropriately.

## 2024-02-12 NOTE — ED Provider Notes (Signed)
[facility] Provider Note    Event Date/Time   First MD Initiated Contact with Patient 02/12/24 [phone]     (approximate)   History   Suicidal   HPI  Jeremy Frank is a 38 y.o. male   Past medical history of hypertension and PTSD, drug use, here with multiple complaints.  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  He also notes that his blood pressure is continuing to run high.  He has chest tightness associated.  He smoked marijuana and used alcohol yesterday but denies any other drug use.  He had no self-harm intent with his drug use and has no other self-harm attempts today.  He is here voluntarily seeking psychiatric help.  He denies respiratory infectious symptoms and points to his substernal area as his chest tightness discomfort.  He denies any other acute medical complaints.   External Medical Documents Reviewed: Emergency department visit dated yesterday for hypertension, chest pain, headache, had CT head negative.      Physical Exam   Triage Vital Signs: ED Triage Vitals  Encounter Vitals Group     BP      Systolic BP Percentile      Diastolic BP Percentile      Pulse      Resp      Temp      Temp src      SpO2      Weight      Height      Head Circumference      Peak Flow      Pain Score      Pain Loc      Pain Education      Exclude from Growth Chart     Most recent vital signs: Vitals:   02/12/24 0604 02/12/24 0606  BP: 128/86   Pulse:    Resp:  16  Temp:    SpO2:      General: Awake, no distress.  CV:  Good peripheral perfusion.  Resp:  Normal effort.  Abd:  No distention.  Other:  Hypertensive and tachycardic otherwise vital signs look normal.  Breathing comfortably with clear lungs, tachycardic rate but no murmurs.  No tremors or tongue fasciculation, does not appear markedly intoxicated nor in withdrawal at this  time.  Appears euvolemic overall.   ED Results / Procedures / Treatments   Labs (all labs ordered are listed, but only abnormal results are displayed) Labs Reviewed  ACETAMINOPHEN LEVEL - Abnormal; Notable for the following components:      Result Value   Acetaminophen (Tylenol), Serum <10 (*)    All other components within normal limits  COMPREHENSIVE METABOLIC PANEL - Abnormal; Notable for the following components:   Glucose, Bld 141 (*)    Creatinine, Ser 1.61 (*)    Total Protein 8.7 (*)    AST 56 (*)    GFR, Estimated 55 (*)    All other components within normal limits  SALICYLATE LEVEL - Abnormal; Notable for the following components:   Salicylate Lvl <7.0 (*)    All other components within normal limits  CBC WITH DIFFERENTIAL/PLATELET - Abnormal; Notable for the following components:   WBC 15.4 (*)    Neutro Abs 9.1 (*)    Lymphs Abs 4.3 (*)    Monocytes Absolute 1.4 (*)    Basophils Absolute 0.2 (*)    Abs Immature Granulocytes 0.08 (*)  All other components within normal limits  ETHANOL  LIPASE, BLOOD  URINE DRUG SCREEN, QUALITATIVE ([facility] ONLY)  TROPONIN I (HIGH SENSITIVITY)     I ordered and reviewed the above labs they are notable for cell counts electrolytes and toxicologic labs unremarkable.  Initial troponin negative.  EKG  ED ECG REPORT I, Pilar Jarvis, the attending physician, personally viewed and interpreted this ECG.   Date: 02/12/2024  EKG Time: 0453  Rate: 122  Rhythm: sinus tachycardia  Axis: nl  Intervals:none  ST&T Change: no stemi    RADIOLOGY I independently reviewed and interpreted chest x-ray and I see no obvious focality pneumothorax I also reviewed radiologist's formal read.   PROCEDURES:  Critical Care performed: No  Procedures   MEDICATIONS ORDERED IN ED: Medications  LORazepam (ATIVAN) tablet 1-4 mg (has no administration in time range)    Or  LORazepam (ATIVAN) injection 1-4 mg (has no administration in time range)   thiamine (VITAMIN B1) tablet 100 mg (has no administration in time range)    Or  thiamine (VITAMIN B1) injection 100 mg (has no administration in time range)  folic acid (FOLVITE) tablet 1 mg (has no administration in time range)  multivitamin with minerals tablet 1 tablet (has no administration in time range)  LORazepam (ATIVAN) injection 2 mg (2 mg Intravenous Given 02/12/24 0509)    IMPRESSION / MDM / ASSESSMENT AND PLAN / ED COURSE  I reviewed the triage vital signs and the nursing notes.                                Patient's presentation is most consistent with acute presentation with potential threat to life or bodily function.  Differential diagnosis includes, but is not limited to, ACS, PE, dissection, pneumothorax, suicidal ideation, decompensated psychiatric illness, substance-induced mood disorder   The patient is on the cardiac monitor to evaluate for evidence of arrhythmia and/or significant heart rate changes.  MDM:    He has chest pain high blood pressure, will check troponins, EKG looks nonischemic, similar occurrence yesterday with negative workup.  I doubt cardiopulmonary emergency, will check troponin and chest x-ray along with basic labs.  Seems that his chief complaint is his psychiatric complaints of suicidal ideation PTSD/flashbacks as well as his drug use.  Will check basic labs and toxicologic labs and if above workup unremarkable, plan will be for psychiatric evaluation voluntarily.  He seems to be quite anxious and so I ordered him some Ativan.        FINAL CLINICAL IMPRESSION(S) / ED DIAGNOSES   Final diagnoses:  Chest tightness  Drug use  Anxiety  Flashbacks (HCC)     Rx / DC Orders   ED Discharge Orders     None        Note:  This document was prepared using Dragon voice recognition software and may include unintentional dictation errors.    Pilar Jarvis, MD 02/12/24 [phone]

## 2024-02-12 NOTE — ED Notes (Signed)
Vol /psych consult pendiong

## 2024-02-12 NOTE — ED Notes (Signed)
Dressed out by [employer]. 1 bookbag that was not searched, 1 pt belonging bag that includes shoes, socks, pants, underwear and shirt.

## 2024-02-12 NOTE — ED Notes (Signed)
Refused d/c vitals, states " they just did mine a minute ago"

## 2024-02-12 NOTE — ED Triage Notes (Signed)
BIB [facility] from home due to suicidal ideation, no attempt and chest pain. PT also reports that he doesn't feel well due to his BP. Pt reports drinking alcohol and smoking marijuana tonight. Pt soft spoke and cooperative with staff.

## 2024-02-15 ENCOUNTER — Emergency Department: Payer: No Typology Code available for payment source

## 2024-02-15 ENCOUNTER — Emergency Department
Admission: EM | Admit: 2024-02-15 | Discharge: 2024-02-15 | Disposition: A | Discharge status: Home / Self Care | Payer: No Typology Code available for payment source | Attending: Emergency Medicine | Admitting: Emergency Medicine

## 2024-02-15 ENCOUNTER — Other Ambulatory Visit: Payer: Self-pay

## 2024-02-15 DIAGNOSIS — F419 Anxiety disorder, unspecified: Secondary | ICD-10-CM | POA: Diagnosis present

## 2024-02-15 DIAGNOSIS — F411 Generalized anxiety disorder: Secondary | ICD-10-CM

## 2024-02-15 DIAGNOSIS — R0789 Other chest pain: Secondary | ICD-10-CM | POA: Diagnosis not present

## 2024-02-15 LAB — COMPREHENSIVE METABOLIC PANEL
ALT: 26 U/L (ref 0–44)
AST: 42 U/L — ABNORMAL HIGH (ref 15–41)
Albumin: 4.5 g/dL (ref 3.5–5.0)
Alkaline Phosphatase: 70 U/L (ref 38–126)
Anion gap: 12 (ref 5–15)
BUN: 16 mg/dL (ref 6–20)
CO2: 22 mmol/L (ref 22–32)
Calcium: 9.5 mg/dL (ref 8.9–10.3)
Chloride: 102 mmol/L (ref 98–111)
Creatinine, Ser: 1.53 mg/dL — ABNORMAL HIGH (ref 0.61–1.24)
GFR, Estimated: 59 mL/min — ABNORMAL LOW (ref >60)
Glucose, Bld: 100 mg/dL — ABNORMAL HIGH (ref 70–99)
Potassium: 3.9 mmol/L (ref 3.5–5.1)
Sodium: 136 mmol/L (ref 135–145)
Total Bilirubin: 1.2 mg/dL (ref 0.0–1.2)
Total Protein: 8 g/dL (ref 6.5–8.1)

## 2024-02-15 LAB — CBC
HCT: 42.1 % (ref 39.0–52.0)
Hemoglobin: 13.9 g/dL (ref 13.0–17.0)
MCH: 28.8 pg (ref 26.0–34.0)
MCHC: 33 g/dL (ref 30.0–36.0)
MCV: 87.2 fL (ref 80.0–100.0)
Platelets: 222 10*3/uL (ref 150–400)
RBC: 4.83 MIL/uL (ref 4.22–5.81)
RDW: 13.2 % (ref 11.5–15.5)
WBC: 12.8 10*3/uL — ABNORMAL HIGH (ref 4.0–10.5)
nRBC: 0 % (ref 0.0–0.2)

## 2024-02-15 LAB — TROPONIN I (HIGH SENSITIVITY)
Troponin I (High Sensitivity): 4 ng/L (ref <18)
Troponin I (High Sensitivity): 7 ng/L (ref <18)

## 2024-02-15 LAB — ACETAMINOPHEN LEVEL: Acetaminophen (Tylenol), Serum: <10 ug/mL — ABNORMAL LOW (ref 10–30)

## 2024-02-15 LAB — ETHANOL: Alcohol, Ethyl (B): <10 mg/dL (ref <10)

## 2024-02-15 LAB — SALICYLATE LEVEL: Salicylate Lvl: <7 mg/dL — ABNORMAL LOW (ref 7.0–30.0)

## 2024-02-15 MED ORDER — LORAZEPAM 2 MG/ML IJ SOLN: 2.0000 mg | Freq: Once | INTRAMUSCULAR | Status: DC

## 2024-02-15 MED ORDER — LORAZEPAM 2 MG PO TABS
2.0000 mg | ORAL_TABLET | Freq: Once | ORAL | Status: AC
  Administered 2024-02-15: 2 mg via ORAL
  Filled 2024-02-15: qty 1

## 2024-02-15 NOTE — ED Notes (Signed)
Once Pt was placed in "the middle," he started stating that he is not SI and "was just saying that because he uses drugs."  Pt requesting to go to [facility].  Pt informed we cannot let him leave because he said he was SI w/ a plan to OD on drugs.
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
## 2024-02-15 NOTE — ED Notes (Signed)
Pt continues to tell anyone who will listen that he was "misunderstood" and he is not actually suicidal and what he meant was his behaviors were those of someone who might be suicidal.
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
## 2024-02-15 NOTE — ED Notes (Signed)
 VOL/  PENDING  CONSULT
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
## 2024-02-15 NOTE — Consult Note (Signed)
[facility] Psychiatric Consult Initial  Patient Name: .[name]  MRN: [id number]  DOB: [birth year]  Consult Order details:  Orders (From admission, onward)     Start     Ordered   02/15/24 1003  IP CONSULT TO PSYCHIATRY       Ordering Provider: Dionne Bucy, MD  Provider:  (Not yet assigned)  Question Answer Comment  Place call to: Psych provider   Reason for Consult Consult      02/15/24 1002   02/15/24 1002  CONSULT TO CALL ACT TEAM       Ordering Provider: Dionne Bucy, MD  Provider:  (Not yet assigned)  Question:  Reason for Consult?  Answer:  Psych consult   02/15/24 1002             Mode of Visit: Tele-visit Virtual Statement:TELE PSYCHIATRY ATTESTATION & CONSENT As the provider for this telehealth consult, I attest that I verified the patient's identity using two separate identifiers, introduced myself to the patient, provided my credentials, disclosed my location, and performed this encounter via a HIPAA-compliant, real-time, face-to-face, two-way, interactive audio and video platform and with the full consent and agreement of the patient (or guardian as applicable.) Patient physical location: [facility] ED. Telehealth provider physical location: home office in state of [place].   Video start time: 12:40 PM Video end time: 1 PM    Psychiatry Consult Evaluation  Service Date: February 15, 2024 LOS:  LOS: 0 days  Chief Complaint "I don't know, I was having an anxiety attack and chest pain. I was losing my life force...my energy" I dont have anywere to go until Friday I went back to a unhealhty living situation. Consumed some drugs I didn't want to hurt myself.  Marijuana 8 am then anxieyt started I feel good I feel happy she was pretty mean to me and it triggered my blood pressure to shoot up  Primary Psychiatric Diagnoses  Panic disorder   Assessment  Jeremy Frank is a 38 y.o. male presenting to [facility] ED on 02/15/2024 10:08 AM for reports of suicidal  ideation.  Per patient he does not want to harm himself and denies suicidal ideations at this time.  Patient states that the nurse asked him if he "was to commit suicide how would he do it", and patient reports "so I said to overdose but I do not want to hurt myself".  Patient reports that he was misunderstood and now questions if smoking marijuana this morning could be deemed as a suicide attempt.  "I guess I need to think about that next time I try to smoke".  Patient states that he smoked marijuana this morning at 8 AM and had conflict with his male roommate, causing his "blood pressure to go up", which increased his anxiety. Patient stated he started to have chest pains as to why he presented to the ED. "She was mean to me. I can't go back there. I'll go to the shelter".   Patient has a medical history of traumatic brain injury, chronic disease, hypertension and chronic kidney disease.  He has a psychiatric history of schizophrenia, PTSD, MDD, polysubstance use disorder and panic disorder.  He reports being compliant with his olanzapine 15 mg regimen, stating that he last took his medication yesterday.  Patient is also prescribed gabapentin 300 mg, hydroxyzine 50 mg, and trazodone 50 mg at bedtime.   Patient is noted sitting in a chair, pleasant, calm and willing to engage.  He reports a "happy"  mood.  Patient reports good sleep cycle and appetite.  He denies SI, HI, AVH, paranoia or delusional thought.  Patient does not meet criteria for inpatient psychiatry at this time.  He is psychiatrically cleared for discharge once medically clear.  Please see plan below for detailed recommendations.   Diagnoses:  Active Hospital problems: Active Problems:   Anxiety state    Plan   ## Psychiatric Medication Recommendations:  Continue his current medication regimen as listed above  ## Medical Decision Making Capacity: Not specifically addressed in this encounter  ## Further Work-up:  -- UDS, EKG.   Defer to ED P While pt on Qtc prolonging medications, please monitor & replete K+ to 4 and Mg2+ to 2 -- most recent EKG on 02/15/24 had QtC of 462 -- Pertinent labwork reviewed earlier this admission includes: CBC, CMP, EKG   ## Disposition:-- There are no psychiatric contraindications to discharge at this time  ## Behavioral / Environmental: - No specific recommendations at this time.     ## Safety and Observation Level:  - Based on my clinical evaluation, I estimate the patient to be at low risk of self harm in the current setting. - At this time, we recommend  routine. This decision is based on my review of the chart including patient's history and current presentation, interview of the patient, mental status examination, and consideration of suicide risk including evaluating suicidal ideation, plan, intent, suicidal or self-harm behaviors, risk factors, and protective factors. This judgment is based on our ability to directly address suicide risk, implement suicide prevention strategies, and develop a safety plan while the patient is in the clinical setting. Please contact our team if there is a concern that risk level has changed.  CSSR Risk Category:C-SSRS RISK CATEGORY: No Risk  Suicide Risk Assessment: Patient has following modifiable risk factors for suicide: None, which we are addressing by continuing outpatient psychiatric treatment and medication compliance. Patient has following non-modifiable or demographic risk factors for suicide: male gender Patient has the following protective factors against suicide: Access to outpatient mental health care  Thank you for this consult request. Recommendations have been communicated to the primary team.  We will psychiatrically clear patient for discharge once medically clear at this time.   Mcneil Sober, NP       History of Present Illness  Relevant Aspects of Hospital ED Course:  Admitted on 02/15/2024 for chest pains and suicidal  ideation.    Patient Report:  "I do not want to harm myself"  Psych ROS:  Depression: Denies depressed mood today but has a history of major depressive disorder Anxiety: Patient has a history of panic disorder Mania (lifetime and current): Patient has a history of mania Psychosis: (lifetime and current): Patient has a diagnosis of schizophrenia    Review of Systems  Psychiatric/Behavioral:  Positive for substance abuse. Negative for hallucinations, memory loss and suicidal ideas. The patient does not have insomnia.   All other systems reviewed and are negative.    Psychiatric and Social History  Psychiatric History:  Information collected from patient and ED treatment team  Prev Dx/Sx: Schizophrenia, MDD, polysubstance use d/o, panic d/o, PTSD [facility]current): Zyprexa, Hydroxyzine, gabapentin, Paxil, trazodone Previous Med Trials:  Therapy: yes  Prior Psych Hospitalization: yes  Prior Self Harm: no Prior Violence: no  Family Psych History: no Family Hx suicide: no  Social History:  Developmental Hx: normal Educational Hx: high school graduate Occupational Hx: Advertising account executive Hx: "  I can't confirm or deny" Living Situation: "I was living with a male friend until 8 AM this morning. I probably have to go to the shelter" Spiritual Hx: "I can't disclose that" Access to weapons/lethal means: no   Substance History Alcohol: yes  Type of alcohol 2 24 oz beers  Last Drink "Saturday evening" Number of drinks per day 2 History of alcohol withdrawal seizures denies History of DT's denies Tobacco: cigarettes (half a pack of cigarettes per day) Illicit drugs: marijuana Prescription drug abuse: denies Rehab hx: denies  Exam Findings  Physical Exam: no abnormalities observed Vital Signs:  Temp:  [98.4 F (36.9 C)] 98.4 F (36.9 C) (02/17 0853) Pulse Rate:  [115] 115 (02/17 0853) Resp:  [20] 20 (02/17 0853) BP: (179)/(120) 179/120  (02/17 0853) SpO2:  [95 %] 95 % (02/17 0853) Weight:  [161 kg] 108 kg (02/17 0853) Blood pressure (!) 179/120, pulse (!) 115, temperature 98.4 F (36.9 C), temperature source Oral, resp. rate 20, height 5\' 11"  (1.803 m), weight 108 kg, SpO2 95%. Body mass index is 33.19 kg/m.  Physical Exam Vitals and nursing note reviewed.  Constitutional:      Appearance: He is well-developed.  Neurological:     General: No focal deficit present.     Mental Status: He is alert and oriented to person, place, and time.  Psychiatric:        Behavior: Behavior is not agitated.     Mental Status Exam: General Appearance: Fairly Groomed  Orientation:  Full (Time, Place, and Person)  Memory:  Immediate;   Good Recent;   Good Remote;   Good  Concentration:  Concentration: Good and Attention Span: Good  Recall:  Good  Attention  Good  Eye Contact:  Good  Speech:  Clear and Coherent  Language:  Good  Volume:  Normal  Mood: Happy  Affect: Constricted  Thought Process:  Coherent  Thought Content:  Logical  Suicidal Thoughts:  No  Homicidal Thoughts:  No  Judgement:  Good  Insight:  Good  Psychomotor Activity:  Normal  Akathisia:  No  Fund of Knowledge:  Fair      Assets:  Communication Skills  Cognition:  WNL  ADL's:  Intact  AIMS (if indicated):        Other History   These have been pulled in through the EMR, reviewed, and updated if appropriate.  Family History:  The patient's family history includes Heart failure in his mother; Hypertension in his father.  Medical History: Past Medical History:  Diagnosis Date   Drug dependence (HCC)    GI bleed    Hiatal hernia    Hypertension    Migraine    PTSD (post-traumatic stress disorder)     Surgical History: Past Surgical History:  Procedure Laterality Date   KNEE SURGERY       Medications:  No current facility-administered medications for this encounter.  Current Outpatient Medications:    naloxone (NARCAN) nasal  spray 4 mg/0.1 mL, Place 1 spray into the nose once. SPRAY 1 SPRAY INTO NOSE AS NEEDED FOR OPIOID OVERDOSE **FOR EMERGENCY USE ONLY!** IN CASE OF EMERGENCY, Disp: , Rfl:    nicotine (NICODERM CQ - DOSED IN MG/24 HOURS) 14 mg/24hr patch, Place 14 mg onto the skin daily., Disp: , Rfl:    amLODipine (NORVASC) 5 MG tablet, Take 1.5 tablets (7.5 mg total) by mouth daily. (Patient not taking: Reported on 02/12/2024), Disp: 45 tablet, Rfl: 0   gabapentin (NEURONTIN) 300 MG capsule,  Take 1 capsule (300 mg total) by mouth 3 (three) times daily. (Patient not taking: Reported on 03/18/2023), Disp: 90 capsule, Rfl: 1   hydrochlorothiazide (HYDRODIURIL) 12.5 MG tablet, Take 1 tablet (12.5 mg total) by mouth daily for 2 days., Disp: 2 tablet, Rfl: 0   hydrOXYzine (ATARAX) 50 MG tablet, Take 1 tablet (50 mg total) by mouth 3 (three) times daily as needed for anxiety. (Patient not taking: Reported on 02/12/2024), Disp: 30 tablet, Rfl: 0   miconazole (MICOTIN) 2 % powder, Apply topically. APPLY MODERATE AMOUNT TOPICALLY TWO TIMES A DAY FOR ATHLETE'S FOOT (Patient not taking: Reported on 10/08/2023), Disp: , Rfl:    OLANZapine (ZYPREXA) 15 MG tablet, Take 1 tablet (15 mg total) by mouth at bedtime. (Patient not taking: Reported on 02/12/2024), Disp: 30 tablet, Rfl: 0   pantoprazole (PROTONIX) 40 MG tablet, Take 2 tablets (80 mg total) by mouth daily. (Patient not taking: Reported on 09/27/2022), Disp: 60 tablet, Rfl: 1   traZODone (DESYREL) 50 MG tablet, Take 1 tablet (50 mg total) by mouth at bedtime as needed for sleep. (Patient not taking: Reported on 02/12/2024), Disp: 30 tablet, Rfl: 0  Allergies: Allergies  Allergen Reactions   Peanut-Containing Drug Products Diarrhea, Anaphylaxis and Swelling   Cheese Diarrhea   Egg-Derived Products Diarrhea    Mcneil Sober, NP

## 2024-02-15 NOTE — ED Notes (Signed)
Pt still interviewing with psych NP at this time

## 2024-02-15 NOTE — ED Notes (Signed)
Unsuccessful attempt to draw blood x3.

## 2024-02-15 NOTE — ED Notes (Signed)
Patient belongings given to pt and allowed to dress self.

## 2024-02-18 ENCOUNTER — Emergency Department
Admission: EM | Admit: 2024-02-18 | Discharge: 2024-02-18 | Disposition: A | Discharge status: Home / Self Care | Payer: No Typology Code available for payment source | Attending: Emergency Medicine | Admitting: Emergency Medicine

## 2024-02-18 ENCOUNTER — Other Ambulatory Visit: Payer: Self-pay

## 2024-02-18 DIAGNOSIS — R451 Restlessness and agitation: Secondary | ICD-10-CM | POA: Diagnosis present

## 2024-02-18 DIAGNOSIS — F419 Anxiety disorder, unspecified: Secondary | ICD-10-CM | POA: Insufficient documentation

## 2024-02-18 DIAGNOSIS — I10 Essential (primary) hypertension: Secondary | ICD-10-CM | POA: Diagnosis not present

## 2024-02-18 LAB — CBC
HCT: 41.3 % (ref 39.0–52.0)
Hemoglobin: 14.1 g/dL (ref 13.0–17.0)
MCH: 29 pg (ref 26.0–34.0)
MCHC: 34.1 g/dL (ref 30.0–36.0)
MCV: 84.8 fL (ref 80.0–100.0)
Platelets: 232 10*3/uL (ref 150–400)
RBC: 4.87 MIL/uL (ref 4.22–5.81)
RDW: 13.2 % (ref 11.5–15.5)
WBC: 9.9 10*3/uL (ref 4.0–10.5)
nRBC: 0 % (ref 0.0–0.2)

## 2024-02-18 LAB — BASIC METABOLIC PANEL
Anion gap: 17 — ABNORMAL HIGH (ref 5–15)
BUN: 18 mg/dL (ref 6–20)
CO2: 20 mmol/L — ABNORMAL LOW (ref 22–32)
Calcium: 9.9 mg/dL (ref 8.9–10.3)
Chloride: 103 mmol/L (ref 98–111)
Creatinine, Ser: 1.53 mg/dL — ABNORMAL HIGH (ref 0.61–1.24)
GFR, Estimated: 59 mL/min — ABNORMAL LOW (ref >60)
Glucose, Bld: 106 mg/dL — ABNORMAL HIGH (ref 70–99)
Potassium: 3.2 mmol/L — ABNORMAL LOW (ref 3.5–5.1)
Sodium: 140 mmol/L (ref 135–145)

## 2024-02-18 LAB — ETHANOL: Alcohol, Ethyl (B): <10 mg/dL (ref <10)

## 2024-02-18 LAB — ACETAMINOPHEN LEVEL: Acetaminophen (Tylenol), Serum: <10 ug/mL — ABNORMAL LOW (ref 10–30)

## 2024-02-18 LAB — SALICYLATE LEVEL: Salicylate Lvl: <7 mg/dL — ABNORMAL LOW (ref 7.0–30.0)

## 2024-02-18 MED ORDER — AMLODIPINE BESYLATE 5 MG PO TABS: 5.0000 mg | ORAL_TABLET | Freq: Every day | ORAL | 2 refills | Status: AC

## 2024-02-18 NOTE — ED Provider Notes (Signed)
[facility] Provider Note    Event Date/Time   First MD Initiated Contact with Patient 02/18/24 0147     (approximate)   History   Psychiatric Evaluation and Hypertension   HPI Jeremy Frank is a 38 y.o. male with 8 ED visits  in the last 6 months.  He presents for a variety of symptoms that change somewhat depending on the person to whom he is speaking.  He was seen less than a week ago for similar issues of anxiety and drug use and hypertension.  He states that he gets anxious and then he uses drugs and then he feels worse.  He states that he has no thoughts of harming himself or anyone else and he denies hearing any voices.  He is agitated but states that he took "some kind of stimulant" as well as marijuana.  He states he knows the drugs make him feel worse but he does it anyway.  He is not having any chest pain, abdominal pain, nor shortness of breath.     Physical Exam   Triage Vital Signs: ED Triage Vitals  Encounter Vitals Group     BP 02/18/24 0118 (!) 198/114     Systolic BP Percentile --      Diastolic BP Percentile --      Pulse Rate 02/18/24 0118 (!) 121     Resp 02/18/24 0118 20     Temp 02/18/24 0118 99 F (37.2 C)     Temp Source 02/18/24 0118 Oral     SpO2 02/18/24 0118 99 %     Weight 02/18/24 0119 108 kg (238 lb)     Height 02/18/24 0119 1.803 m (5\' 11" )     Head Circumference --      Peak Flow --      Pain Score 02/18/24 0123 0     Pain Loc --      Pain Education --      Exclude from Growth Chart --     Most recent vital signs: Vitals:   02/18/24 0118  BP: (!) 198/114  Pulse: (!) 121  Resp: 20  Temp: 99 F (37.2 C)  SpO2: 99%    General: Awake, alert, ambulatory without difficulty.  Well-appearing. CV:  Good peripheral perfusion.  Regular rhythm, mild tachycardia. Resp:  Normal effort. Speaking easily and comfortably, no accessory muscle usage nor intercostal retractions.   Abd:  No distention.  Other:  Patient  is agitated, pacing around the room, but will make eye contact during discussions.  He is not making any overt hostile or aggressive actions.  He denies SI and HI.   ED Results / Procedures / Treatments   Labs (all labs ordered are listed, but only abnormal results are displayed) Labs Reviewed  BASIC METABOLIC PANEL - Abnormal; Notable for the following components:      Result Value   Potassium 3.2 (*)    CO2 20 (*)    Glucose, Bld 106 (*)    Creatinine, Ser 1.53 (*)    GFR, Estimated 59 (*)    Anion gap 17 (*)    All other components within normal limits  SALICYLATE LEVEL - Abnormal; Notable for the following components:   Salicylate Lvl <7.0 (*)    All other components within normal limits  ACETAMINOPHEN LEVEL - Abnormal; Notable for the following components:   Acetaminophen (Tylenol), Serum <10 (*)    All other components within normal limits  CBC  ETHANOL  URINE DRUG SCREEN, QUALITATIVE ([facility] ONLY)     PROCEDURES:  Critical Care performed: No  Procedures    IMPRESSION / MDM / ASSESSMENT AND PLAN / ED COURSE  I reviewed the triage vital signs and the nursing notes.                              Differential diagnosis includes, but is not limited to, polysubstance abuse, anxiety, medication/drug side effect  Patient's presentation is most consistent with acute presentation with potential threat to life or bodily function.  Labs/studies ordered: As per protocol, I ordered the following labs as part of the patient's medical and psychiatric evaluation:  CBC, CMP, ethanol level, acetaminophen level, salicylate level, urine drug screen.  Interventions/Medications given:  Medications - No data to display  (Note:  hospital course my include additional interventions and/or labs/studies not listed above.)   Hypertensive, and patient reports noncompliance with amlodipine.  He has a primary care provider at the [place], but is unclear when he last saw them.  Labs are all  stable from prior including his chronic kidney disease with a creatinine of around 1.5.  Patient is agitated but representing no immediate danger to himself or others.  He told me that he wants to go and in my opinion he has the capacity to make that decision and does not meet any criteria for involuntary commitment.  I feel that keeping him here against his will would only escalate the situation and possibly lead to violence, and given that I have no reason to keep him, I will discharge him with recommendations for outpatient follow-up.  I discussed with him also his blood pressure and I sent a prescription for amlodipine to his pharmacy and encouraged him to take it and to avoid using drugs.  He nods agreement and understanding and will be discharged under his own recognizance         FINAL CLINICAL IMPRESSION(S) / ED DIAGNOSES   Final diagnoses:  Agitation  Anxiety  Uncontrolled hypertension     Rx / DC Orders   ED Discharge Orders          Ordered    amLODipine (NORVASC) 5 MG tablet  Daily        02/18/24 0229             Note:  This document was prepared using Dragon voice recognition software and may include unintentional dictation errors.   Loleta Rose, MD 02/18/24 [phone]

## 2024-02-18 NOTE — ED Notes (Signed)
Patient is a hard stick. This RN and EDT have tried for blood several times. Lab called to obtain blood. Lab at bedside at this time with security to obtain blood.

## 2024-02-18 NOTE — ED Notes (Addendum)
Pt belongings: 3 bags  Belt, flannel, jeans, Camo book bag, black shoes, jeans, blue shirt, cell phone  Pt dressed out in triage by this RN and EDT [place] with [employer] from hospital police in room as well.

## 2024-02-18 NOTE — ED Triage Notes (Signed)
Pt arrived via POV with reports of high blood pressure, pt states he also needs mental health help, pt is delayed at responding to answers, frequently taking deep breaths.   Pt was brought by [facility] however, he was dropped off at the front door and no report given to hospital staff.   Pt seen a few days ago for same, acting same.  Pt is malodorous of marijuana as well.

## 2024-02-18 NOTE — Discharge Instructions (Signed)
You continue to have issues with anxiety, as well as with blood pressure, and it is likely contributed by taking recreational drugs.  Please avoid taking any drugs other than what you are prescribed by your doctors.  Your blood pressure is very high tonight and we wrote your prescription for amlodipine, blood pressure medicine you have taken before.  Please take it according label instructions and follow-up at the [place] at the next available opportunity to discuss all of your symptoms.  If you have any thoughts of killing yourself or anyone else, please return to the nearest emergency department for additional care.
